# Patient Record
Sex: Female | Born: 1935 | Race: White | Hispanic: No | Smoking: Never smoker
Health system: Southern US, Community
[De-identification: ages and names within clinical notes are randomized; demographics above are authoritative.]

## PROBLEM LIST (undated history)

## (undated) DIAGNOSIS — B029 Zoster without complications: Secondary | ICD-10-CM

## (undated) DIAGNOSIS — F419 Anxiety disorder, unspecified: Secondary | ICD-10-CM

## (undated) DIAGNOSIS — I4891 Unspecified atrial fibrillation: Secondary | ICD-10-CM

## (undated) DIAGNOSIS — E7800 Pure hypercholesterolemia, unspecified: Secondary | ICD-10-CM

## (undated) DIAGNOSIS — E559 Vitamin D deficiency, unspecified: Secondary | ICD-10-CM

## (undated) DIAGNOSIS — B0222 Postherpetic trigeminal neuralgia: Secondary | ICD-10-CM

## (undated) DIAGNOSIS — M810 Age-related osteoporosis without current pathological fracture: Secondary | ICD-10-CM

## (undated) DIAGNOSIS — I639 Cerebral infarction, unspecified: Secondary | ICD-10-CM

## (undated) DIAGNOSIS — I2699 Other pulmonary embolism without acute cor pulmonale: Secondary | ICD-10-CM

## (undated) DIAGNOSIS — N189 Chronic kidney disease, unspecified: Secondary | ICD-10-CM

## (undated) DIAGNOSIS — I6350 Cerebral infarction due to unspecified occlusion or stenosis of unspecified cerebral artery: Secondary | ICD-10-CM

## (undated) DIAGNOSIS — E162 Hypoglycemia, unspecified: Secondary | ICD-10-CM

## (undated) DIAGNOSIS — K219 Gastro-esophageal reflux disease without esophagitis: Secondary | ICD-10-CM

## (undated) DIAGNOSIS — R413 Other amnesia: Secondary | ICD-10-CM

## (undated) DIAGNOSIS — E039 Hypothyroidism, unspecified: Secondary | ICD-10-CM

## (undated) DIAGNOSIS — F410 Panic disorder [episodic paroxysmal anxiety] without agoraphobia: Secondary | ICD-10-CM

## (undated) DIAGNOSIS — H409 Unspecified glaucoma: Secondary | ICD-10-CM

## (undated) DIAGNOSIS — M1990 Unspecified osteoarthritis, unspecified site: Secondary | ICD-10-CM

## (undated) DIAGNOSIS — G500 Trigeminal neuralgia: Secondary | ICD-10-CM

## (undated) DIAGNOSIS — I82409 Acute embolism and thrombosis of unspecified deep veins of unspecified lower extremity: Secondary | ICD-10-CM

## (undated) DIAGNOSIS — G43909 Migraine, unspecified, not intractable, without status migrainosus: Secondary | ICD-10-CM

## (undated) DIAGNOSIS — F0390 Unspecified dementia without behavioral disturbance: Secondary | ICD-10-CM

## (undated) DIAGNOSIS — Z7901 Long term (current) use of anticoagulants: Secondary | ICD-10-CM

## (undated) DIAGNOSIS — R001 Bradycardia, unspecified: Secondary | ICD-10-CM

## (undated) DIAGNOSIS — S8990XA Unspecified injury of unspecified lower leg, initial encounter: Secondary | ICD-10-CM

## (undated) DIAGNOSIS — I10 Essential (primary) hypertension: Secondary | ICD-10-CM

## (undated) DIAGNOSIS — K5790 Diverticulosis of intestine, part unspecified, without perforation or abscess without bleeding: Secondary | ICD-10-CM

## (undated) DIAGNOSIS — I809 Phlebitis and thrombophlebitis of unspecified site: Secondary | ICD-10-CM

## (undated) DIAGNOSIS — K589 Irritable bowel syndrome without diarrhea: Secondary | ICD-10-CM

## (undated) HISTORY — DX: Unspecified osteoarthritis, unspecified site: M19.90

## (undated) HISTORY — DX: Pure hypercholesterolemia, unspecified: E78.00

## (undated) HISTORY — DX: Age-related osteoporosis without current pathological fracture: M81.0

## (undated) HISTORY — DX: Zoster without complications: B02.9

## (undated) HISTORY — DX: Panic disorder (episodic paroxysmal anxiety): F41.0

## (undated) HISTORY — DX: Unspecified atrial fibrillation: I48.91

## (undated) HISTORY — DX: Hypothyroidism, unspecified: E03.9

## (undated) HISTORY — DX: Vitamin D deficiency, unspecified: E55.9

## (undated) HISTORY — DX: Other amnesia: R41.3

## (undated) HISTORY — DX: Acute embolism and thrombosis of unspecified deep veins of unspecified lower extremity: I82.409

## (undated) HISTORY — PX: LEG / ANKLE SOFT TISSUE BIOPSY: SUR148

## (undated) HISTORY — DX: Unspecified glaucoma: H40.9

## (undated) HISTORY — DX: Hypoglycemia, unspecified: E16.2

## (undated) HISTORY — DX: Cerebral infarction, unspecified: I63.9

## (undated) HISTORY — DX: Other pulmonary embolism without acute cor pulmonale: I26.99

## (undated) HISTORY — DX: Gastro-esophageal reflux disease without esophagitis: K21.9

## (undated) HISTORY — DX: Diverticulosis of intestine, part unspecified, without perforation or abscess without bleeding: K57.90

## (undated) HISTORY — DX: Postherpetic trigeminal neuralgia: B02.22

## (undated) HISTORY — DX: Trigeminal neuralgia: G50.0

## (undated) HISTORY — DX: Chronic kidney disease, unspecified: N18.9

## (undated) HISTORY — DX: Unspecified dementia, unspecified severity, without behavioral disturbance, psychotic disturbance, mood disturbance, and anxiety: F03.90

## (undated) HISTORY — DX: Anxiety disorder, unspecified: F41.9

## (undated) HISTORY — PX: TONSILLECTOMY: SUR1361

## (undated) HISTORY — DX: Essential (primary) hypertension: I10

## (undated) HISTORY — DX: Cerebral infarction due to unspecified occlusion or stenosis of unspecified cerebral artery: I63.50

## (undated) HISTORY — PX: DILATION AND CURETTAGE, DIAGNOSTIC / THERAPEUTIC: SUR384

## (undated) HISTORY — DX: Migraine, unspecified, not intractable, without status migrainosus: G43.909

## (undated) HISTORY — DX: Irritable bowel syndrome, unspecified: K58.9

## (undated) HISTORY — DX: Phlebitis and thrombophlebitis of unspecified site: I80.9

---
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
## 1962-03-25 DIAGNOSIS — I639 Cerebral infarction, unspecified: Secondary | ICD-10-CM

## 1962-03-25 HISTORY — DX: Cerebral infarction, unspecified: I63.9

## 1963-07-24 HISTORY — PX: OTHER SURGICAL HISTORY: SHX169

## 1964-03-25 HISTORY — PX: HEMORRHOID SURGERY: SHX153

## 1969-03-25 HISTORY — PX: OTHER SURGICAL HISTORY: SHX169

## 1976-03-25 HISTORY — PX: FOOT SURGERY: SHX648

## 1978-03-25 HISTORY — PX: GALLBLADDER SURGERY: SHX652

## 1983-01-24 HISTORY — PX: CHOLECYSTECTOMY: SHX55

## 2001-08-23 HISTORY — PX: CARDIAC CATHETERIZATION: SHX172

## 2001-08-29 ENCOUNTER — Encounter: Payer: Self-pay | Admitting: *Deleted

## 2001-08-29 ENCOUNTER — Encounter (INDEPENDENT_AMBULATORY_CARE_PROVIDER_SITE_OTHER): Payer: Self-pay | Admitting: Cardiology

## 2001-08-29 ENCOUNTER — Inpatient Hospital Stay (HOSPITAL_COMMUNITY): Admission: EM | Admit: 2001-08-29 | Discharge: 2001-09-05 | Payer: Self-pay | Admitting: *Deleted

## 2001-08-30 ENCOUNTER — Encounter: Payer: Self-pay | Admitting: Cardiology

## 2001-09-03 ENCOUNTER — Encounter: Payer: Self-pay | Admitting: Cardiology

## 2004-04-19 ENCOUNTER — Ambulatory Visit (HOSPITAL_COMMUNITY): Admission: RE | Admit: 2004-04-19 | Discharge: 2004-04-19 | Payer: Self-pay | Admitting: Family Medicine

## 2005-03-25 DIAGNOSIS — I82409 Acute embolism and thrombosis of unspecified deep veins of unspecified lower extremity: Secondary | ICD-10-CM

## 2005-03-25 HISTORY — DX: Acute embolism and thrombosis of unspecified deep veins of unspecified lower extremity: I82.409

## 2005-03-25 HISTORY — PX: OTHER SURGICAL HISTORY: SHX169

## 2005-03-25 HISTORY — PX: PULMONARY EMBOLISM SURGERY: SHX752

## 2005-04-18 ENCOUNTER — Ambulatory Visit (HOSPITAL_COMMUNITY): Admission: RE | Admit: 2005-04-18 | Discharge: 2005-04-18 | Payer: Self-pay | Admitting: Emergency Medicine

## 2005-04-19 ENCOUNTER — Inpatient Hospital Stay (HOSPITAL_COMMUNITY): Admission: EM | Admit: 2005-04-19 | Discharge: 2005-04-24 | Payer: Self-pay | Admitting: Emergency Medicine

## 2005-04-30 ENCOUNTER — Ambulatory Visit: Payer: Self-pay | Admitting: Internal Medicine

## 2005-05-02 ENCOUNTER — Inpatient Hospital Stay (HOSPITAL_COMMUNITY): Admission: EM | Admit: 2005-05-02 | Discharge: 2005-05-04 | Payer: Self-pay | Admitting: Emergency Medicine

## 2005-05-17 ENCOUNTER — Encounter: Admission: RE | Admit: 2005-05-17 | Discharge: 2005-05-17 | Payer: Self-pay | Admitting: Endocrinology

## 2005-06-11 ENCOUNTER — Encounter: Admission: RE | Admit: 2005-06-11 | Discharge: 2005-06-11 | Payer: Self-pay | Admitting: Endocrinology

## 2005-06-11 ENCOUNTER — Encounter (INDEPENDENT_AMBULATORY_CARE_PROVIDER_SITE_OTHER): Payer: Self-pay | Admitting: Specialist

## 2005-06-11 ENCOUNTER — Other Ambulatory Visit: Admission: RE | Admit: 2005-06-11 | Discharge: 2005-06-11 | Payer: Self-pay | Admitting: Interventional Radiology

## 2005-06-25 ENCOUNTER — Ambulatory Visit (HOSPITAL_COMMUNITY): Admission: RE | Admit: 2005-06-25 | Discharge: 2005-06-25 | Payer: Self-pay | Admitting: Endocrinology

## 2005-06-28 ENCOUNTER — Ambulatory Visit: Payer: Self-pay | Admitting: Internal Medicine

## 2005-07-01 LAB — CBC WITH DIFFERENTIAL/PLATELET
BASO%: 0.7 % (ref 0.0–2.0)
Basophils Absolute: 0 10*3/uL (ref 0.0–0.1)
EOS%: 4.7 % (ref 0.0–7.0)
HCT: 33.5 % — ABNORMAL LOW (ref 34.8–46.6)
HGB: 11.2 g/dL — ABNORMAL LOW (ref 11.6–15.9)
LYMPH%: 19 % (ref 14.0–48.0)
MCH: 28.7 pg (ref 26.0–34.0)
MCHC: 33.4 g/dL (ref 32.0–36.0)
MCV: 85.9 fL (ref 81.0–101.0)
MONO%: 12.7 % (ref 0.0–13.0)
NEUT%: 62.9 % (ref 39.6–76.8)
lymph#: 1 10*3/uL (ref 0.9–3.3)

## 2005-07-01 LAB — COMPREHENSIVE METABOLIC PANEL
ALT: 9 U/L (ref 0–40)
AST: 15 U/L (ref 0–37)
Albumin: 4 g/dL (ref 3.5–5.2)
CO2: 22 mEq/L (ref 19–32)
Calcium: 8.9 mg/dL (ref 8.4–10.5)

## 2005-09-13 ENCOUNTER — Other Ambulatory Visit: Admission: RE | Admit: 2005-09-13 | Discharge: 2005-09-13 | Payer: Self-pay | Admitting: Family Medicine

## 2005-12-23 HISTORY — PX: OTHER SURGICAL HISTORY: SHX169

## 2006-03-06 ENCOUNTER — Ambulatory Visit: Admission: RE | Admit: 2006-03-06 | Discharge: 2006-03-06 | Payer: Self-pay | Admitting: Family Medicine

## 2006-03-06 ENCOUNTER — Encounter: Payer: Self-pay | Admitting: Vascular Surgery

## 2006-06-24 HISTORY — PX: OTHER SURGICAL HISTORY: SHX169

## 2006-07-07 ENCOUNTER — Ambulatory Visit (HOSPITAL_COMMUNITY): Admission: RE | Admit: 2006-07-07 | Discharge: 2006-07-07 | Payer: Self-pay | Admitting: Family Medicine

## 2006-07-10 ENCOUNTER — Inpatient Hospital Stay (HOSPITAL_COMMUNITY): Admission: EM | Admit: 2006-07-10 | Discharge: 2006-07-15 | Payer: Self-pay | Admitting: Emergency Medicine

## 2006-07-11 ENCOUNTER — Encounter (INDEPENDENT_AMBULATORY_CARE_PROVIDER_SITE_OTHER): Payer: Self-pay | Admitting: Cardiology

## 2006-07-11 ENCOUNTER — Ambulatory Visit: Payer: Self-pay | Admitting: Oncology

## 2006-07-28 ENCOUNTER — Ambulatory Visit: Payer: Self-pay | Admitting: Internal Medicine

## 2007-01-26 ENCOUNTER — Ambulatory Visit: Payer: Self-pay | Admitting: Internal Medicine

## 2007-01-28 LAB — APTT: aPTT: 35 seconds (ref 24–37)

## 2007-01-28 LAB — CBC WITH DIFFERENTIAL/PLATELET
Eosinophils Absolute: 0.3 10*3/uL (ref 0.0–0.5)
LYMPH%: 25 % (ref 14.0–48.0)
MCHC: 34.9 g/dL (ref 32.0–36.0)
MCV: 85.3 fL (ref 81.0–101.0)
MONO%: 16.3 % — ABNORMAL HIGH (ref 0.0–13.0)
NEUT%: 51.7 % (ref 39.6–76.8)
Platelets: 163 10*3/uL (ref 145–400)
RBC: 3.89 10*6/uL (ref 3.70–5.32)

## 2008-05-11 ENCOUNTER — Ambulatory Visit (HOSPITAL_COMMUNITY): Admission: RE | Admit: 2008-05-11 | Discharge: 2008-05-11 | Payer: Self-pay | Admitting: Family Medicine

## 2008-09-21 ENCOUNTER — Emergency Department (HOSPITAL_COMMUNITY): Admission: EM | Admit: 2008-09-21 | Discharge: 2008-09-21 | Payer: Self-pay | Admitting: Emergency Medicine

## 2008-09-23 ENCOUNTER — Encounter: Admission: RE | Admit: 2008-09-23 | Discharge: 2008-09-23 | Payer: Self-pay | Admitting: Neurology

## 2008-10-27 ENCOUNTER — Emergency Department (HOSPITAL_COMMUNITY): Admission: EM | Admit: 2008-10-27 | Discharge: 2008-10-27 | Payer: Self-pay | Admitting: Emergency Medicine

## 2008-11-07 ENCOUNTER — Inpatient Hospital Stay (HOSPITAL_COMMUNITY): Admission: AD | Admit: 2008-11-07 | Discharge: 2008-11-08 | Payer: Self-pay | Admitting: Internal Medicine

## 2008-11-11 ENCOUNTER — Inpatient Hospital Stay (HOSPITAL_COMMUNITY): Admission: AD | Admit: 2008-11-11 | Discharge: 2008-11-19 | Payer: Self-pay | Admitting: Internal Medicine

## 2008-11-12 ENCOUNTER — Encounter (INDEPENDENT_AMBULATORY_CARE_PROVIDER_SITE_OTHER): Payer: Self-pay | Admitting: Internal Medicine

## 2008-11-15 ENCOUNTER — Encounter (INDEPENDENT_AMBULATORY_CARE_PROVIDER_SITE_OTHER): Payer: Self-pay | Admitting: Internal Medicine

## 2008-11-15 ENCOUNTER — Ambulatory Visit: Payer: Self-pay | Admitting: Vascular Surgery

## 2008-11-22 ENCOUNTER — Emergency Department (HOSPITAL_COMMUNITY): Admission: EM | Admit: 2008-11-22 | Discharge: 2008-11-22 | Payer: Self-pay | Admitting: Emergency Medicine

## 2009-01-09 ENCOUNTER — Encounter: Admission: RE | Admit: 2009-01-09 | Discharge: 2009-02-10 | Payer: Self-pay | Admitting: Neurology

## 2009-02-06 ENCOUNTER — Encounter: Admission: RE | Admit: 2009-02-06 | Discharge: 2009-02-06 | Payer: Self-pay | Admitting: Family Medicine

## 2009-05-25 ENCOUNTER — Observation Stay (HOSPITAL_COMMUNITY): Admission: EM | Admit: 2009-05-25 | Discharge: 2009-05-29 | Payer: Self-pay | Admitting: Emergency Medicine

## 2009-05-25 ENCOUNTER — Ambulatory Visit: Payer: Self-pay | Admitting: Vascular Surgery

## 2009-05-25 ENCOUNTER — Encounter (INDEPENDENT_AMBULATORY_CARE_PROVIDER_SITE_OTHER): Payer: Self-pay | Admitting: Emergency Medicine

## 2010-01-03 ENCOUNTER — Encounter: Admission: RE | Admit: 2010-01-03 | Discharge: 2010-01-03 | Payer: Self-pay | Admitting: Gastroenterology

## 2010-03-14 ENCOUNTER — Encounter
Admission: RE | Admit: 2010-03-14 | Discharge: 2010-03-14 | Payer: Self-pay | Source: Home / Self Care | Attending: Neurology | Admitting: Neurology

## 2010-04-15 ENCOUNTER — Encounter: Payer: Self-pay | Admitting: Family Medicine

## 2010-04-15 ENCOUNTER — Encounter: Payer: Self-pay | Admitting: Endocrinology

## 2010-06-17 LAB — BASIC METABOLIC PANEL
BUN: 19 mg/dL (ref 6–23)
CO2: 24 mEq/L (ref 19–32)
Calcium: 8.7 mg/dL (ref 8.4–10.5)
Creatinine, Ser: 1.22 mg/dL — ABNORMAL HIGH (ref 0.4–1.2)
Glucose, Bld: 108 mg/dL — ABNORMAL HIGH (ref 70–99)

## 2010-06-17 LAB — CBC
HCT: 29.3 % — ABNORMAL LOW (ref 36.0–46.0)
Platelets: 128 10*3/uL — ABNORMAL LOW (ref 150–400)
RDW: 15.6 % — ABNORMAL HIGH (ref 11.5–15.5)

## 2010-06-17 LAB — URINE CULTURE
Colony Count: 8000
Special Requests: NEGATIVE

## 2010-06-17 LAB — COMPREHENSIVE METABOLIC PANEL
Albumin: 3.3 g/dL — ABNORMAL LOW (ref 3.5–5.2)
Alkaline Phosphatase: 91 U/L (ref 39–117)
BUN: 17 mg/dL (ref 6–23)
Creatinine, Ser: 1.25 mg/dL — ABNORMAL HIGH (ref 0.4–1.2)
Potassium: 4.3 mEq/L (ref 3.5–5.1)
Total Protein: 6 g/dL (ref 6.0–8.3)

## 2010-06-17 LAB — PROTIME-INR: INR: 1.22 (ref 0.00–1.49)

## 2010-06-17 LAB — APTT: aPTT: 40 seconds — ABNORMAL HIGH (ref 24–37)

## 2010-06-30 LAB — COMPREHENSIVE METABOLIC PANEL
ALT: 10 U/L (ref 0–35)
ALT: <8 U/L (ref 0–35)
AST: 15 U/L (ref 0–37)
AST: 18 U/L (ref 0–37)
Albumin: 3.6 g/dL (ref 3.5–5.2)
Albumin: 3.7 g/dL (ref 3.5–5.2)
Alkaline Phosphatase: 57 U/L (ref 39–117)
Alkaline Phosphatase: 62 U/L (ref 39–117)
BUN: 12 mg/dL (ref 6–23)
CO2: 23 mEq/L (ref 19–32)
CO2: 25 mEq/L (ref 19–32)
Calcium: 9.4 mg/dL (ref 8.4–10.5)
Chloride: 93 mEq/L — ABNORMAL LOW (ref 96–112)
Chloride: 93 mEq/L — ABNORMAL LOW (ref 96–112)
Creatinine, Ser: 1.15 mg/dL (ref 0.4–1.2)
GFR calc Af Amer: 55 mL/min — ABNORMAL LOW (ref >60)
GFR calc Af Amer: >60 mL/min (ref >60)
GFR calc non Af Amer: 46 mL/min — ABNORMAL LOW (ref >60)
GFR calc non Af Amer: 51 mL/min — ABNORMAL LOW (ref >60)
Glucose, Bld: 97 mg/dL (ref 70–99)
Potassium: 4.2 mEq/L (ref 3.5–5.1)
Potassium: 4.4 mEq/L (ref 3.5–5.1)
Sodium: 124 mEq/L — ABNORMAL LOW (ref 135–145)
Sodium: 126 mEq/L — ABNORMAL LOW (ref 135–145)
Total Bilirubin: 0.6 mg/dL (ref 0.3–1.2)
Total Bilirubin: 0.8 mg/dL (ref 0.3–1.2)
Total Protein: 6.9 g/dL (ref 6.0–8.3)

## 2010-06-30 LAB — URINALYSIS, ROUTINE W REFLEX MICROSCOPIC
Glucose, UA: NEGATIVE mg/dL
Glucose, UA: NEGATIVE mg/dL
Hgb urine dipstick: NEGATIVE
Specific Gravity, Urine: 1.005 (ref 1.005–1.030)
pH: 5.5 (ref 5.0–8.0)
pH: 5.5 (ref 5.0–8.0)

## 2010-06-30 LAB — BASIC METABOLIC PANEL
BUN: 16 mg/dL (ref 6–23)
BUN: 24 mg/dL — ABNORMAL HIGH (ref 6–23)
BUN: 30 mg/dL — ABNORMAL HIGH (ref 6–23)
BUN: 16 mg/dL (ref 6–23)
CO2: 23 mEq/L (ref 19–32)
CO2: 24 mEq/L (ref 19–32)
CO2: 25 mEq/L (ref 19–32)
CO2: 25 mEq/L (ref 19–32)
CO2: 25 mEq/L (ref 19–32)
Calcium: 8.9 mg/dL (ref 8.4–10.5)
Calcium: 8.9 mg/dL (ref 8.4–10.5)
Calcium: 9.5 mg/dL (ref 8.4–10.5)
Calcium: 9.5 mg/dL (ref 8.4–10.5)
Chloride: 87 mEq/L — ABNORMAL LOW (ref 96–112)
Chloride: 87 mEq/L — ABNORMAL LOW (ref 96–112)
Chloride: 88 mEq/L — ABNORMAL LOW (ref 96–112)
Chloride: 88 mEq/L — ABNORMAL LOW (ref 96–112)
Chloride: 92 mEq/L — ABNORMAL LOW (ref 96–112)
Chloride: 93 mEq/L — ABNORMAL LOW (ref 96–112)
Chloride: 99 mEq/L (ref 96–112)
Creatinine, Ser: 1.3 mg/dL — ABNORMAL HIGH (ref 0.4–1.2)
Creatinine, Ser: 1.31 mg/dL — ABNORMAL HIGH (ref 0.4–1.2)
Creatinine, Ser: 1.33 mg/dL — ABNORMAL HIGH (ref 0.4–1.2)
Creatinine, Ser: 1.68 mg/dL — ABNORMAL HIGH (ref 0.4–1.2)
GFR calc Af Amer: 31 mL/min — ABNORMAL LOW (ref >60)
GFR calc Af Amer: 36 mL/min — ABNORMAL LOW (ref >60)
GFR calc Af Amer: 47 mL/min — ABNORMAL LOW (ref >60)
GFR calc Af Amer: 48 mL/min — ABNORMAL LOW (ref >60)
GFR calc Af Amer: 48 mL/min — ABNORMAL LOW (ref >60)
GFR calc Af Amer: 52 mL/min — ABNORMAL LOW (ref >60)
GFR calc non Af Amer: 39 mL/min — ABNORMAL LOW (ref >60)
GFR calc non Af Amer: 40 mL/min — ABNORMAL LOW (ref >60)
GFR calc non Af Amer: 42 mL/min — ABNORMAL LOW (ref >60)
GFR calc non Af Amer: 43 mL/min — ABNORMAL LOW (ref >60)
Glucose, Bld: 130 mg/dL — ABNORMAL HIGH (ref 70–99)
Glucose, Bld: 87 mg/dL (ref 70–99)
Glucose, Bld: 90 mg/dL (ref 70–99)
Glucose, Bld: 90 mg/dL (ref 70–99)
Glucose, Bld: 97 mg/dL (ref 70–99)
Potassium: 4.2 mEq/L (ref 3.5–5.1)
Potassium: 4.2 mEq/L (ref 3.5–5.1)
Potassium: 4.2 mEq/L (ref 3.5–5.1)
Potassium: 4.3 mEq/L (ref 3.5–5.1)
Potassium: 4.6 mEq/L (ref 3.5–5.1)
Sodium: 120 mEq/L — ABNORMAL LOW (ref 135–145)
Sodium: 121 mEq/L — ABNORMAL LOW (ref 135–145)
Sodium: 124 mEq/L — ABNORMAL LOW (ref 135–145)
Sodium: 125 mEq/L — ABNORMAL LOW (ref 135–145)
Sodium: 135 mEq/L (ref 135–145)
Sodium: 131 mEq/L — ABNORMAL LOW (ref 135–145)

## 2010-06-30 LAB — DIFFERENTIAL
Basophils Absolute: 0 10*3/uL (ref 0.0–0.1)
Basophils Absolute: 0 10*3/uL (ref 0.0–0.1)
Basophils Relative: 0 % (ref 0–1)
Basophils Relative: 1 % (ref 0–1)
Eosinophils Absolute: 0.2 10*3/uL (ref 0.0–0.7)
Eosinophils Absolute: 0.2 10*3/uL (ref 0.0–0.7)
Eosinophils Absolute: 0.1 10*3/uL (ref 0.0–0.7)
Eosinophils Relative: 2 % (ref 0–5)
Eosinophils Relative: 4 % (ref 0–5)
Eosinophils Relative: 4 % (ref 0–5)
Eosinophils Relative: 2 % (ref 0–5)
Lymphocytes Relative: 17 % (ref 12–46)
Lymphocytes Relative: 31 % (ref 12–46)
Lymphs Abs: 1.2 10*3/uL (ref 0.7–4.0)
Lymphs Abs: 1.2 10*3/uL (ref 0.7–4.0)
Monocytes Absolute: 1 10*3/uL (ref 0.1–1.0)
Monocytes Absolute: 1.1 10*3/uL — ABNORMAL HIGH (ref 0.1–1.0)
Monocytes Absolute: 0.8 10*3/uL (ref 0.1–1.0)
Monocytes Relative: 19 % — ABNORMAL HIGH (ref 3–12)
Monocytes Relative: 13 % — ABNORMAL HIGH (ref 3–12)
Neutro Abs: 3 10*3/uL (ref 1.7–7.7)
Neutrophils Relative %: 48 % (ref 43–77)
Neutrophils Relative %: 60 % (ref 43–77)

## 2010-06-30 LAB — CBC
HCT: 29.9 % — ABNORMAL LOW (ref 36.0–46.0)
HCT: 30.5 % — ABNORMAL LOW (ref 36.0–46.0)
HCT: 30.6 % — ABNORMAL LOW (ref 36.0–46.0)
HCT: 30 % — ABNORMAL LOW (ref 36.0–46.0)
Hemoglobin: 10.2 g/dL — ABNORMAL LOW (ref 12.0–15.0)
Hemoglobin: 10.4 g/dL — ABNORMAL LOW (ref 12.0–15.0)
Hemoglobin: 10.5 g/dL — ABNORMAL LOW (ref 12.0–15.0)
Hemoglobin: 10.5 g/dL — ABNORMAL LOW (ref 12.0–15.0)
Hemoglobin: 10.8 g/dL — ABNORMAL LOW (ref 12.0–15.0)
Hemoglobin: 10.9 g/dL — ABNORMAL LOW (ref 12.0–15.0)
Hemoglobin: 10.2 g/dL — ABNORMAL LOW (ref 12.0–15.0)
MCHC: 33.3 g/dL (ref 30.0–36.0)
MCHC: 33.6 g/dL (ref 30.0–36.0)
MCHC: 34.5 g/dL (ref 30.0–36.0)
MCHC: 34.8 g/dL (ref 30.0–36.0)
MCHC: 34.9 g/dL (ref 30.0–36.0)
MCV: 87 fL (ref 78.0–100.0)
MCV: 87.9 fL (ref 78.0–100.0)
MCV: 88.1 fL (ref 78.0–100.0)
MCV: 88.1 fL (ref 78.0–100.0)
Platelets: 125 10*3/uL — ABNORMAL LOW (ref 150–400)
Platelets: 130 10*3/uL — ABNORMAL LOW (ref 150–400)
Platelets: 161 10*3/uL (ref 150–400)
Platelets: 143 10*3/uL — ABNORMAL LOW (ref 150–400)
RBC: 3.32 MIL/uL — ABNORMAL LOW (ref 3.87–5.11)
RBC: 3.44 MIL/uL — ABNORMAL LOW (ref 3.87–5.11)
RBC: 3.52 MIL/uL — ABNORMAL LOW (ref 3.87–5.11)
RBC: 3.57 MIL/uL — ABNORMAL LOW (ref 3.87–5.11)
RBC: 3.69 MIL/uL — ABNORMAL LOW (ref 3.87–5.11)
RBC: 3.4 MIL/uL — ABNORMAL LOW (ref 3.87–5.11)
RDW: 15.6 % — ABNORMAL HIGH (ref 11.5–15.5)
RDW: 16.2 % — ABNORMAL HIGH (ref 11.5–15.5)
RDW: 16.2 % — ABNORMAL HIGH (ref 11.5–15.5)
RDW: 16.5 % — ABNORMAL HIGH (ref 11.5–15.5)
WBC: 4.6 10*3/uL (ref 4.0–10.5)
WBC: 5.1 10*3/uL (ref 4.0–10.5)
WBC: 5.3 10*3/uL (ref 4.0–10.5)
WBC: 5.8 10*3/uL (ref 4.0–10.5)
WBC: 5.9 10*3/uL (ref 4.0–10.5)
WBC: 6.1 10*3/uL (ref 4.0–10.5)

## 2010-06-30 LAB — POCT CARDIAC MARKERS
CKMB, poc: <1 ng/mL — ABNORMAL LOW (ref 1.0–8.0)
Myoglobin, poc: 133 ng/mL (ref 12–200)
Myoglobin, poc: 94 ng/mL (ref 12–200)

## 2010-06-30 LAB — ACTH STIMULATION, 3 TIME POINTS
Cortisol, 30 Min: 18.5 ug/dL (ref >20)
Cortisol, 60 Min: 22.6 ug/dL (ref >20)

## 2010-06-30 LAB — URINE MICROSCOPIC-ADD ON

## 2010-06-30 LAB — URINE CULTURE: Colony Count: 15000

## 2010-06-30 LAB — AMYLASE: Amylase: 96 U/L (ref 27–131)

## 2010-06-30 LAB — CULTURE, BLOOD (ROUTINE X 2): Culture: NO GROWTH

## 2010-06-30 LAB — LIPASE, BLOOD: Lipase: 33 U/L (ref 11–59)

## 2010-06-30 LAB — TSH: TSH: 1.328 u[IU]/mL (ref 0.350–4.500)

## 2010-06-30 LAB — CLOSTRIDIUM DIFFICILE EIA

## 2010-07-02 LAB — DIFFERENTIAL
Basophils Absolute: 0 10*3/uL (ref 0.0–0.1)
Basophils Relative: 0 % (ref 0–1)
Eosinophils Absolute: 0 10*3/uL (ref 0.0–0.7)
Monocytes Absolute: 1.4 10*3/uL — ABNORMAL HIGH (ref 0.1–1.0)
Monocytes Relative: 12 % (ref 3–12)
Neutro Abs: 9.3 10*3/uL — ABNORMAL HIGH (ref 1.7–7.7)
Neutrophils Relative %: 79 % — ABNORMAL HIGH (ref 43–77)

## 2010-07-02 LAB — BASIC METABOLIC PANEL
CO2: 25 mEq/L (ref 19–32)
Calcium: 9.3 mg/dL (ref 8.4–10.5)
Chloride: 100 mEq/L (ref 96–112)
Creatinine, Ser: 1.39 mg/dL — ABNORMAL HIGH (ref 0.4–1.2)
Glucose, Bld: 98 mg/dL (ref 70–99)

## 2010-07-02 LAB — CBC
Hemoglobin: 11.2 g/dL — ABNORMAL LOW (ref 12.0–15.0)
MCHC: 33.8 g/dL (ref 30.0–36.0)
MCV: 87.4 fL (ref 78.0–100.0)
RDW: 15.9 % — ABNORMAL HIGH (ref 11.5–15.5)

## 2010-07-02 LAB — APTT: aPTT: 31 seconds (ref 24–37)

## 2010-08-07 NOTE — H&P (Signed)
[name], [name]               ACCOUNT NO.:  [id number]   MEDICAL RECORD NO.:  [id number]          PATIENT TYPE:  INP   LOCATION:  5149                         FACILITY:  [facility]   PHYSICIAN:  Donalynn Furlong, MD      DATE OF BIRTH:  16-Apr-1929   DATE OF ADMISSION:  11/11/2008  DATE OF DISCHARGE:                              HISTORY & PHYSICAL   PRIMARY CARE Samadhi Mahurin:  Stacie Acres. White, M.D.   CHIEF COMPLAINT:  Worsening nausea, vomiting, diarrhea.   HISTORY OF PRESENT ILLNESS:  [name] is a 77 year old Caucasian  female.  She lives in [place] by herself.  She presented to [facility] today.  She was recently released from hospital on August  17 after getting treatment for UTI with Rocephin and discharged home on  Ceftin to finish the 5-day course.  After she was discharged, she had a  good day on Wednesday.  On Thursday evening, she started having nausea  and vomiting with a dose of her medication and some diarrhea.  Her  symptoms progressed to the level that she could not keep her food down  without vomiting, which disturbed her lot, and she decided to see her  primary care Haylee Mcanany's office.  Today morning, she went to see her  primary care today, and it was decided that she needs to be admitted in  the hospital.  The patient has some chronic cough and shortness of  breath associated with the nausea and vomiting.  The patient denies any  chest pain, any sputum production.  Patient has no severe abdominal pain  either.  The patient has a decreased urinary output, as mentioned by the  patient.  The patient mentioned she passes urine very little, otherwise  she is doing well.  The patient has been feeling dry in her mouth also.  The patient has mild swelling at her ankle too.  The patient has  documented multiple allergies, but she has tolerated ceftriaxone in the  last admission without any trouble.  Most of the allergies are in terms  of nausea, vomiting for  PENICILLIN.   PAST MEDICAL HISTORY:  Recurrent DVTs with PE, status post Greenfield  filter, glaucoma, migraines hypoglycemia, gastroesophageal reflux  disease with hiatal hernia, diverticulosis, palpitations, atrial  fibrillation, phlebitis, osteoarthritis in bilateral knees and back,  panic attacks, irritable bowel syndrome, hypertension, post-herpetic  neuralgia of the right face, goiter, hypothyroidism, sleep apnea on  CPAP.  The patient has not been using CPAP machine.  Vitamin D  deficiency.  CVA in the past.  Breast biopsies x3, cholecystectomy,  hemorrhoidectomy, tonsillectomy, appendectomy, cyst removed from right  foot as well.   PAST MEDICAL HISTORY:  As per past medical history.   FAMILY HISTORY:  Father with hypertension.  Mother with diabetes,  glaucoma, CHF, hypertension, coronary artery disease, CVA.  Maternal  grandfather with stomach cancer.  One brother with hypertension and CVA.  Maternal uncle with rectal cancer and colon cancer.  Maternal aunt with  breast cancer.   SOCIAL HISTORY:  No smoking or alcohol use in the past.  No illicit drug  use.  She is a retired Oceanographer.  She is divorced.  She has 3  children.   Allergy list includes PRESERVATIVES IN EYEDROPS, SEPTRA, PLAVIX,  PENICILLIN, PLAVIX, PENICILLIN, PATANOL, NAPROSYN, LEVAQUIN, COUMADIN,  COMBINVENT, CIPRO, CELEBREX, BIAXIN, BETADINE, BENADRYL, AMBIEN,  SHELLFISH, IV DYE, SHRIMP, and VOLATREN.   Home medication list includes sotalol, Lumigan, Citracal, vitamin D,  Claritin, potassium chloride, dorzolamide/timolol eye drops, Ativan,  meclizine, Imodium, EpiPen, vitamin D, Diovan, Lovenox, levothyroxine,  sodium, Pravachol, Neurontin, amitriptyline, Aciphex, Lasix, Darvocet-N  100, folic acid, hydrocodone/acetaminophen tablets, cephalexin,  Cymbalta, Carbatrol, and promethazine.   PHYSICAL EXAMINATION:  VITAL SIGNS:  Blood pressure 122/78, temperature  98.0, respiration 18 per minute.   Heart rate 92 per minute.  GENERAL:  Alert, oriented, laying in bed without any acute distress.  CARDIOVASCULAR:  S1 and S2 regular.  No murmur or gallop.  LUNGS:  Clear to auscultation bilaterally.  ABDOMEN:  Obese.  Active bowel sounds.  Soft and tender to palpation  along the suprapubic region.  No rebound or guarding.  EXTREMITIES:  No clubbing, cyanosis or edema.  Moving all four  extremities.  BACK: No CVA tenderness.  SKIN:  No rash or bruits.  NEUROLOGIC:  Intact cranial nerves, muscular strength, sensation and  reflexes.  HEAD:  Normocephalic nontraumatic.  EYES:  Pupils are equal, round and reactive to light and accommodation.  Extraocular muscles intact.  NECK:  No thyromegaly or JVD.   LAB WORK:  The patient did not have any lab work done today.  Her  previous lab work shows on August 17 with a creatinine of 1.3 and GFR of  40.  CBC with differential on August 17 showed WBC 6.2, hemoglobin 10.5,  platelets 129.  The patient does not have any other workup done from  labs today.   Her abdominal x-ray, two-view, shows no acute findings.   Chest X-Ray: Two-view shows cardiomegaly and vascular congestion.   ASSESSMENT/PLAN:  1. Urinary tract infection with Pseudomonas bacteria which is pan      sensitive.  The patient has received partial treatment for      Pseudomonas urinary tract infection, and she is improving now.  2. Nausea, vomiting and diarrhea secondary to polypharmacy.  3. History of recurrent deep venous thrombosis, status post pulmonary      embolus with Greenfield filter in the past.  4. Glaucoma.  5. Gastroesophageal reflux disease.  6. Atrial fibrillation.  7. Osteoarthritis.  8. Panic attacks.  9. Irritable bowel syndrome.  10.Hypertension.  11.Goiter/hypothyroidism.  12.Post-herpetic neuralgia.  13.Sleep apnea.  14.Vitamin D deficiency.  15.History of cerebrovascular accident.   PLAN:  Will admit the patient on medical bed with a diagnosis of  urinary  tract infection.  The patient is full code.  Will keep her n.p.o. except  medicine, ice chips, and water.  She can have medicines with juice.  Recheck vitals, input/output per unit protocol.  Will check CBC with  differential, CMP in the morning.  Will check UA, urine culture, blood  culture now.  We already have gotten x-ray of chest and abdomen today.  The patient has also blood drawn for CBC, CMP, amylase, lipase which are  pending at this time.  The patient will be started on Rocephin 1 gram IV  q.24h.  Will provide morphine 1 to 2 mg IV q.4h.  p.r.n. for pain.  Will  continue Lovenox 100 mg subcutaneously every night.  We will provide  Zofran 4 mg  IV q.4h. p.r.n. for nausea, vomiting.  Will continue  sotalol, Lumigan, Citracal, vitamin D, Claritin.  Will provide potassium  chloride, dorzolamide/timolol eye drops, vitamin D,  Diovan/hydrochlorothiazide, Lovenox, levothyroxine, Pravachol,  Neurontin, amitriptyline, Aciphex at home dose.  Will continue Darvocet-  N 100/650, folic acid, Cymbalta, Carbatrol, promethazine  Hydrocodone/acetaminophen, Imodium, and Ativan per home dose.  We will  hold cephalexin today.  We will give her dose of 40 mg IV Lasix and  watch her volume status.  Overall further plan according to the workup  pending.      Donalynn Furlong, MD  Electronically Signed     TVP/MEDQ  D:  11/11/2008  T:  11/11/2008  Job:  [id number]   cc:   Stacie Acres. Cliffton Asters, M.D.

## 2010-08-07 NOTE — Discharge Summary (Signed)
[name], [name]               ACCOUNT NO.:  [id number]   MEDICAL RECORD NO.:  [id number]          PATIENT TYPE:  INP   LOCATION:  3017                         FACILITY:  [facility]   PHYSICIAN:  Ramiro Harvest, MD    DATE OF BIRTH:  1930-02-21   DATE OF ADMISSION:  11/07/2008  DATE OF DISCHARGE:  11/08/2008                               DISCHARGE SUMMARY   PRIMARY CARE PHYSICIAN:  Dr. Laurann Montana of [facility].   DISCHARGE DIAGNOSES:  1. Pseudomonas urinary tract infection.  2. Recurrent deep vein thromboses with pulmonary embolus, status post      inferior vena cava filter.  3. History of glaucoma.  4. Migraines.  5. Hypoglycemia.  6. Gastroesophageal reflux disease/hiatal hernia.  7. Diverticulosis.  8. Atrial fibrillation.  9. Osteoarthritis.  10.Panic attacks.  11.Irritable bowel syndrome.  12.Hypertension.  13.Postherpetic neuralgia.  14.Goiter.  15.Hypothyroidism.  16.Obstructive sleep apnea, was supposed to be on continuous positive      airway pressure; however, the patient stated has not used      continuous positive airway pressure in a month.  17.History of cerebrovascular accident in 1965.  18.Status post breast biopsies, 1971 and 1986.  19.Status post cholecystectomy in 1984.  20.Status post hemorrhoidectomy in 1966.  21.Status post tonsillectomy in 1954.  22.Status post appendectomy in 1945.  23.Status post right foot cyst removal.  24.Status post inferior vena cava filter.  25.History of right facial postherpetic neuralgia.   DISCHARGE MEDICATIONS:  1. Ceftin 500 mg p.o. b.i.d. x5 more days.  2. Sotalol 120 mg p.o. b.i.d.  3. Lumigan 0.03% one drop to the affected eye every evening daily.  4. Citrucel Plus D 315/200 p.o. daily.  5. Claritin 10 mg p.o. daily p.r.n.  6. Potassium chloride 20 mEq p.o. 3 times weekly.  7. Dorzolamide/timolol 2/0.5% one drop to the right eye twice daily.  8. Ativan 0.5 mg 2 tablets p.o. p.r.n.  9. Meclizine 25 mg  p.o. t.i.d. p.r.n.  10.Imodium AD 2 mg p.o. p.r.n.  11.EpiPen 0.3 mg/ 0.3 mL injection as directed.  12.Vitamin D 50,000 units 1 capsule weekly.  13.Diovan/ACR 80/12.5 mg p.o. daily.  14.Lovenox 100 mg subcutaneously q.h.s.  15.Levothyroxine 25 mcg p.o. q.a.m.  16.Pravachol 40 mg p.o. daily.  17.Neurontin 300 mg p.o. t.i.d.  18.Amitriptyline 10 mg p.o. q.h.s., which the patient states has not      used in the past month.  19.Aciphex 20 mg p.o. daily.  20.Lasix 40 mg p.o. 3 times weekly.  21.Darvocet-N 100/650 p.o. q.6 h. p.r.n. pain.  22.Folic acid 1 mg p.o. daily.  23.Hydrocodone/APAP 7.5/500 p.o. q.6 h. p.r.n. pain.  24.Cymbalta 60 mg p.o. daily.  25.Carbatrol 100 mg 2 tablets p.o. b.i.d.   DISPOSITION AND FOLLOWUP:  The patient will be discharged home.  The  patient will follow up with PCP in 1 week.  On followup, a repeat  urinalysis will need to be done for resolution of the UTI.   PROCEDURES PERFORMED:  None.   CONSULTATIONS DONE:  None.   BRIEF ADMISSION HISTORY AND PHYSICAL:  [name] is a 77-year-  old white female who presented to her PCP November 04, 2008, with  complaints of increased urinary frequency and nausea.  Urine cultures  which were at that time done revealed a Pseudomonas.  Patient with  multiple ANTIBIOTIC allergies and as such, we were asked to admit the  patient for IV antibiotics and further evaluation and treatment.  The  patient did report that she had some symptoms of nausea, which have  improved, and had a low-grade temperature of 99.9 on the morning of  admission.   PHYSICAL EXAMINATION:  Temperature of 97.8, blood pressure 125/58, pulse  of 54, respirations 18, saturating 97% on room air.  GENERAL:  The patient is a well-developed, well-nourished female in no  apparent distress.  HEENT:  Normocephalic, atraumatic.  Pupils equal, round and reactive to  light and accommodation.  Extraocular movements intact.  Oropharynx is  clear.  No  lesions.  No exudate.  NECK:  Supple.  No lymphadenopathy.  RESPIRATORY:  Lungs are clear to auscultation bilaterally.  CARDIOVASCULAR:  Regular rate and rhythm.  No murmurs, rubs or gallops.  ABDOMEN:  Soft, nontender, nondistended.  Positive bowel sounds.  EXTREMITIES:  No clubbing, cyanosis or edema.  NEUROLOGIC:  The patient is alert and oriented x3.  Cranial nerves II-  XII were grossly intact.  No focal deficits.   HOSPITAL COURSE:  Pseudomonas urinary tract infection.  Per  sensitivities obtained from her PCP's office, the patient's Pseudomonas  was pansensitive.  The patient remained stable.  The patient did have a  history of allergy to LEVOFLOXACIN as well, and as such the patient was  placed on IV Rocephin.  It was noted per patient and per PCP that the  patient did have a slight allergy to PENICILLIN, which was causing a  rash; however, it was noted on the patient's medication list that the  patient had been tolerating cephalexin twice daily and was doing fine  with that.  The patient was thus placed on IV Rocephin.  The patient was  able to the tolerate IV Rocephin without any complications or any side  effects from it.  The patient remained stable.  The patient remained  afebrile.  CBC which was obtained revealed a normal white count and the  patient was in stable and improved condition on the day of discharge.  The patient will be discharged home on Ceftin 500 mg twice daily x5 more  days, as the patient will receive 1 more dose of Rocephin IV prior to  discharge.  The patient will complete the Ceftin for 5 more days to  complete a 7-day course of antibiotic treatment for her Pseudomonas UTI.  The patient will need to follow up with PCP 1 week post discharge to be  reassessed and repeat UA for resolution of her urinary tract infection.   The rest of the patient's chronic medical issues remained stable  throughout the hospitalization.  The patient will be discharged in   stable and improved condition.   On the day of discharge, vital signs:  Temperature 97.9, pulse of 57,  blood pressure 120/61, respirations 18, saturating 99% on room air.   DISCHARGE LABS:  Sodium 135, potassium 3.9, chloride 102, bicarb 25,  glucose 97, BUN 16, creatinine 1.30, calcium of 9.5.  CBC with a white  count of 6.2, hemoglobin of 10.5, hematocrit of 31.1, platelet count of  129, ANC of 3.0.   It was a pleasure taking care of [name].  Ramiro Harvest, MD  Electronically Signed     DT/MEDQ  D:  11/08/2008  T:  11/08/2008  Job:  [id number]   cc:   Stacie Acres. Cliffton Asters, M.D.

## 2010-08-07 NOTE — H&P (Signed)
[name], [name]               ACCOUNT NO.:  [id number]   MEDICAL RECORD NO.:  [id number]          PATIENT TYPE:  INP   LOCATION:  3017                         FACILITY:  [facility]   PHYSICIAN:  Ramiro Harvest, MD    DATE OF BIRTH:  1929/04/27   DATE OF ADMISSION:  11/07/2008  DATE OF DISCHARGE:                              HISTORY & PHYSICAL   CHIEF COMPLAINT:  Urinary frequency and nausea as well as low-grade  temperature.   HISTORY OF PRESENT ILLNESS:  [name] is a 77 year old white female  who presented to her primary MD on November 04, 2008 with complaint of  urinary frequency and nausea.  Urine culture taken at that time,  revealed Pseudomonas.  The patient has multiple antibiotic allergies.  We were asked to admit the patient for further evaluation and treatment.  The patient does report that her symptoms of frequency and nausea have  improved, which she did have a low-grade temperature, early this morning  of 99.   ALLERGIES:  1. AMBIEN.  2. IVP DYE causes hives.  3. VOLTAREN causes reflux.  4. EYE DROPS are preservative.  5. COMBIGAN.  6. CIPRO causes rash.  7. CELEBREX causes rash.  8. BIAXIN.  9. BETADINE.  10.BENADRYL.  11.SHELLFISH causes anaphylaxis.  12.SEPTRA causes rash.  13.PLAVIX causes rash.  14.PENICILLIN causes rash.  15.PATANOL.  16.NAPROSYN causes rash.  17.LEVAQUIN causes rash.  18.COUMADIN causes rash.   PAST MEDICAL HISTORY:  1. Recurrent DVTs with PE, status post IVC filter.  2. Glaucoma.  3. Migraines.  4. Hypoglycemia.  5. GERD/hiatal hernia.  6. Diverticulosis.  7. Atrial fibrillation.  8. Osteoarthritis.  9. Panic attacks.  10.Irritable bowel syndrome.  11.Hypertension.  12.Postherpetic neuralgia.  13.Goiter.  14.Hypothyroidism.  15.Obstructive sleep apnea on CPAP.  16.History of CVA in 1965.   PAST SURGICAL HISTORY:  1. Breast biopsies in 1971 and 1986.  2. Cholecystectomy in 1984.  3. Hemorrhoidectomy in 1966.  4,   Tonsillectomy in 1954.  1. Appendectomy in 1945.  2. Cyst removal, right foot.  3. IVC filter.   FAMILY HISTORY:  Her mother had history of diabetes, glaucoma, CHF,  hypertension, coronary artery disease, and CVA.  Her father had  hypertension.  Maternal grandfather had stomach cancer.  Brother  hypertension and CVA.  Maternal uncle had rectal cancer and colon  cancer.  Maternal aunt had breast cancer.   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  She is divorced.  Denies tobacco.  She notes occasional wine.   MEDICATIONS:  1. Sotalol hydrochloride 120 mg one tablet p.o. b.i.d.  2. Lumigan 0.03% one drop injected to the affected eye every evening      once a day.  3. Citracal plus D 315 - 200 p.o. daily.  4. Claritin 10 mg p.o. daily p.r.n.  5. Potassium chloride 20 mEq one tablet p.o. three times weekly.  6. Dorzolamide - timolol 2 - 0.5% one drop into right eye twice daily.  7. Ativan 0.5 mg two tablets p.o. p.r.n.  8. Meclizine hydrochloride 25 mg one tablet p.o. t.i.d.  p.r.n.  9. Imodium A-D 2 mg one tablet p.o. p.r.n.  10.EpiPen 0.3 mg/0.3 mL injection as directed.  11.Vitamin D 50,000 units one capsule p.o. weekly.  12.Diovan HCT 80 - 12.5 mg one tablet p.o. daily.  13.Lovenox 100 mg subcu at bedtime.  14.Levothyroxine 25 mcg p.o. every morning.  15.Pravachol 40 mg p.o. daily.  16.Neurontin 300 mg p.o. t.i.d.  17.Amitriptyline hydrochloride 10 mg p.o. at bedtime.  18.Aciphex 20 mg p.o. daily.  19.Lasix 40 mg p.o. three time weekly.  20.Darvocet-N 100 100 - 650 one tablet p.o. as needed q.6 h. p.r.n.      pain.  21.Folic acid 1 mg p.o. daily.  22.Hydrocodone - acetaminophen 7.5 - 500 mg one tablet p.o. q.6 h.      p.r.n. pain.  23.Cephalexin 500 mg p.o. b.i.d.  24.Cymbalta 60 mg p.o. daily.  25.Carbatrol 100 mg two capsules p.o. b.i.d.   REVIEW OF SYSTEMS:  GENERAL:  Notes positive fever of 99 early this  morning, positive chills.  ENT:  Reports scratchy  throat.  CARDIOVASCULAR:  Denies chest pain, did note positive palpitations last  night.  RESPIRATORY:  Positive cough with clear sputum.  GI:  Positive  nausea which is improved, denies vomiting, had four loose stools this  morning, resolved after Imodium.  GU: Denies dysuria or hematuria.  PSYCHIATRIC:  Anxiety is well controlled, does note mild depression.  NEURO:  Positive postherpetic neuralgia, right eye, improved with recent  addition of Carbatrol.  Denies visual changes.  MUSCULOSKELETAL:  Chronic OA at baseline, did have joint aches on Thursday and Friday of  last week.  HEME:  Notes easy bruisability, denies bleed.   LABORATORY/RADIOLOGY:  These are pending.   PHYSICAL EXAMINATION:  VITAL SIGNS:  BP 125/58, heart rate 54,  respiratory rate 18, temperature 97.8, O2 sat 97% on room air.  HEAD:  Normocephalic, atraumatic.  GENERAL:  Awake, alert elderly white female in no acute distress.  ENT:  Moist oral mucosa.  NECK:  Supple.  No masses.  No JVD.  CARDIOVASCULAR:  S1, S2, regular rate and rhythm, 1+ bilateral lower  extremity edema.  RESPIRATORY:  Breath sounds are clear to auscultation bilaterally  without wheezes, rales, or rhonchi.  No increased work of breathing.  ABDOMEN:  Soft, nontender, nondistended.  Positive bowel sounds noted.  SKIN:  No rashes.  MUSCULOSKELETAL:  No joint swelling or effusion.  PSYCHIATRIC:  A and O x3, pleasant.   ASSESSMENT AND PLAN:  1. Pseudomonas, urinary tract infection, pansensitive.  If she is      clinically stable, we will give Rocephin IV if tolerates.  Her      remains stable overnight, anticipate discharge to home on November 08, 2008 on oral Ceftin.  We will check a CBC and BMET this      afternoon.  Chart indicates that the patient's reaction to      penicillin is rash.  2. History of recurrent deep venous thromboses with pulmonary      embolism.  Continue daily Lovenox injections.  3. History of atrial fibrillation, rate  is stable.  Continue sotalol      and Lovenox.  4. Osteoarthritis, stable.  5. Migraines, stable.  6. Glaucoma, stable.  Continue home drops.  7. Gastroesophageal reflux disease/hiatal hernia, stable on proton      pump inhibitor.  Continue the same.  8. Diverticulosis, stable.  9. Postherpetic neuralgia.  Continue Carbatrol.  Symptoms are  improving per the patient.  10.Anxiety/depression.  This is stable on Cymbalta and Ativan p.r.n.  11.History of goiter/hypothyroid.  This is stable.  Check TSH.      Continue Synthroid.  12.Irritable bowel syndrome, had four loose stools last night,      monitor.  This was resolved with status post Imodium.  13.Hypertension, stable.  Continue Diovan HCT.  14.Obstructive sleep apnea.  We will continue CPAP at bedtime at home      setting.  15.History of cerebrovascular accident, remote, currently at baseline.  16.Dyslipidemia.  Continue statin.  17.Chronic systolic heart failure.  Ejection fraction 55% per 2-D echo      in 2008 with mild hypokinesis of the mid      distal septal wall.  She takes Lasix three times weekly, and notes      increasing lower extremity edema.  Therefore, we will change her      with Lasix to daily at this point and monitor next.  18.Disposition.  Anticipate discharge to home on November 08, 2008 if      stable.      Sandford Craze, NP      Ramiro Harvest, MD  Electronically Signed    MO/MEDQ  D:  11/07/2008  T:  11/08/2008  Job:  [id number]   cc:   Stacie Acres. Cliffton Asters, M.D.

## 2010-08-10 NOTE — Consult Note (Signed)
[name], [name]               ACCOUNT NO.:  [id number]   MEDICAL RECORD NO.:  [id number]          PATIENT TYPE:  INP   LOCATION:  3708                         FACILITY:  [facility]   PHYSICIAN:  Lennis P. Darrold Span, M.D.DATE OF BIRTH:  November 23, 1929   DATE OF CONSULTATION:  07/11/2006  DATE OF DISCHARGE:                                 CONSULTATION   HEMATOLOGY/ONCOLOGY CONSULTATION NOTE:   HISTORY OF PRESENT ILLNESS:  The patient is a 77 year old white female  with a history of coagulopathy, sent at the request of the hospitalist  service with a new DVT below her IVC filter, which is involving distal  IVC to iliac bifurcation.  The patient has previously been seen at our  office by Dr. Arbutus Ped and has been followed by primary physician,  Laurann Montana, and Chanda Busing for cardiology.   Patient has a history of phlebitis with pregnancies in the 81s.  In  one dictation available, there is mention of DVT and PEs in 1992,  however, the patient and other records suggest that 2003 bilateral PEs  and lower extremity DVT was the first presentation.  In 2003, she was  treated with heparin, then Lovenox, then subsequently as an outpatient  went onto Coumadin until she developed a allergic rash.  The patient  recalls the rash is nonpuritic, nonpalpable, red areas from her neck to  her back, but I have no other information on this presently.  One of the  dictations states that she was rechallenged with Coumadin and again  had allergic reaction.  I do not have information concerning that.  She  was either off anticoagulation or on aspirin alone in January of 2007,  when she again had extensive pulmonary emboli.  She was discharged on  Lovenox, then readmitted about a week later with cellulitis or  abscess at the Lovenox injection site at her abdomen.  An IVC filter  was placed then; however, full records including H&P and discharge  summary from that admission are not available to me.  I  believe that she  was discharged on Ticlid.  Coagulation workup, in February of 2007, is  detailed in Dr. Asa Lente note of March 05, 2006, attached.  Patient  and/or her other physicians were reluctant to maintain her on low-dose  Lovenox, and I believe that she has been on Ticlid and baby aspirin most  recently until this admission.  The patient does tell me that she has  used Lovenox injections or half an injection prior to traveling during  the past year.   Over the past week or so, prior to this admission, patient had developed  progressive swelling in her lower extremities and dyspnea on exertion.  Because of IV contrast allergy, she had a VQ scan done outpatient, July 07, 2006, which was negative for pulmonary emboli.  A CT of abdomen and  pelvis, without contrast, July 10, 2006, had an unremarkable abdomen,  the apparent DVT below the IVC filter extending into the iliac  bifurcation, and also distended left ovarian vein thought collateral  flow and diverticulosis.  Patient was begun on  IV heparin, dosed by  pharmacy on April 17.  She developed slurred speech on April 18 a.m. and  heparin was held until head CT, without contrast, showed no acute  abnormality.  Heparin has been resumed.  Patient has not felt short of  breath with limited activity today.  Her lower extremity swelling has  improved and she had not had any gross bleeding.   REVIEW OF SYSTEMS:  Extensive bruising since on Ticlid.  No pain  except arthritis, low back and knees.  Bowels have been unchanged  without obvious bleeding.  No cough.   PAST MEDICAL HISTORY:  1. She has multiple drug allergies and intolerances listed.  2. Congestive heart failure.  3. Hypertension.  4. Arrhythmia listed.  5. Gastroesophageal reflux disease.  6. She had nonneoplastic goiter documented by fine needle aspiration      of the thyroid in March of 2007.   EXAMINATION:  GENERAL:  She is awake, alert, very pleasant and   cooperative.  She seems to have some difficulty with expressive aphasia  at times, but speech is not noticeably slurred.  She is extremely obese,  weight listed 104 kilograms.  VITAL SIGNS:  Temperature 98.7.  Heart rate 73 and regular.  Respirations 20.  Blood pressure 135/59.  Room air saturation 93%.  HEENT:  Pupils equal and reactive.  Not icteric.  Extraocular movements  intact.  No peripheral adenopathy.  LUNGS:  Without wheezes or rales.  Though diminished breath sounds in  the lower fields bilaterally.  She has about a 9 x 5 cm  ecchymosis/hematoma left lateral abdomen.  She has multiple other  bruises on her back.  She has multiple other bruises on her back.  Large  area, right anterior lower leg.  HEART:  Regular rate and rhythm.  ABDOMEN:  Obese, soft.  Nothing palpable.  LOWER EXTREMITIES:  Are not tightly swollen and not tender.   LABORATORIES:  From April 17, white count 10.7, ANC 8.0, hemoglobin  10.0, platelets 133, INR 1.1, sodium 136, potassium 4.4, chloride 108,  glucose 122, BUN 17, creatinine 1.4, total bili 1.1, alkaline  phosphatase 104, GOT 21, GPT 14, total protein 6.8, albumin 3.5.   IMPRESSION/RECOMMENDATION:  1. Systematic extensive clotting below IVC filter:  Agree with IV      heparin with close monitoring by pharmacy until maximum benefit is      achieved from the standpoint of the leg swelling.  Long term      management will clearly be difficult as she will certainly reclot      without anticoagulation and clearly could have more complications      from treatment.  We will discuss with Dr. Arbutus Ped and others of our      group as needed.  2. Still some speech abnormalities that may need further evaluation.  3. Mild thrombocytopenia on admission with followup CBCs ordered by      pharmacy.  4. Whatever history of cardiac problems.  5. History of multiple drug intolerances or allergies, which from the     standpoint of the Lovenox and Coumadin may or  may not be      significant enough to preclude use with these.   Thank you for the consultation.      Lennis P. Darrold Span, M.D.  Electronically Signed     LPL/MEDQ  D:  07/11/2006  T:  07/12/2006  Job:  [id number]   cc:   Lajuana Matte, MD  Stacie Acres. White,  M.D.  Madaline Savage, M.D.

## 2010-08-10 NOTE — Cardiovascular Report (Signed)
[name], [name]               ACCOUNT NO.:  [id number]   MEDICAL RECORD NO.:  [id number]          PATIENT TYPE:  INP   LOCATION:  2031                         FACILITY:  [facility]   PHYSICIAN:  Cristy Hilts. Jacinto Halim, MD       DATE OF BIRTH:  [birth year]   DATE OF PROCEDURE:  05/02/2005  DATE OF DISCHARGE:                              CARDIAC CATHETERIZATION   PROCEDURE PERFORMED:  1.  Right heart catheterization.  2.  Placement of TrapEase IVC filter.   INDICATIONS FOR PROCEDURE:  Ms. Cherita Hebel is a 77 year old female with a  history of pulmonary embolism in 1992 and was put on Coumadin and has  developed a severe rash throughout the body and had to be discontinued.  She  was just on aspirin only.  She was readmitted to the hospital on April 18, 2005, with recurrent pulmonary embolism and was discharged home on Lovenox  subcu chronically.  Because of allergy to the Coumadin, she was advised  Lovenox therapy on a long term basis.  However, the patient developed  cellulitis of her injection site and, also, patient now states that she will  not be able to take anymore Lovenox.  Given the fact that she is  hypercoagulable the fact of IVC filter is probably a good idea and to  proceed with the same.  After having discussed the pros and cons and  alternatives with the patient and the daughter, who was present at the  bedside, also discussing the case with Dr. Laurann Montana, who is her primary  care physician, we decided to electively proceed with placement of IVC  filter.  Right heart catheterization was performed to evaluate for pulmonary  hypertension as the patient has history of prior PE and has chronic  shortness of breath.  Prior to placement of IVC filter, it was felt that it  was probably prudent to proceed with right heart catheterization.   TECHNIQUES OF PROCEDURE:  Right heart catheterization with the usual sterile  precautions, using a 7 French right femoral venous access, a  balloon tip  Swan-Ganz catheter was advanced into the inferior vena cava and then into  the right atrium and then pulmonary artery was cannulated and pulmonary  capillary wedge was easily obtained.  Right heart hemodynamics was carefully  analyzed and then the catheter was pulled out of the body in the usual  fashion.   TECHNIQUES OF IVC FILTER PLACEMENT:  The 7 French right femoral venous  sheath was exchanged for a 6 [place] long TrapEase introducer sheath.  This  introducer sheath was carefully positioned in the inferior vena cava and  inferior vena cavogram was performed.  The origin of the renal veins were  carefully located.  Then, under fluoroscopic guidance, the TrapEase device  was carefully advanced into the introducer sheath and an introducer sheath  was carefully pulled back in the usual fashion with excellent deployment of  the IVC filter.  Post deployment  IVC gram revealed excellent position and apposition to the wall of the IVC.  The renal veins were also well visualized superior to  the IVC filter.  Overall, the patient tolerated the procedure well.  A total of 50 mL of  contrast was utilized for angiography.  No immediate complications were  noted.      Cristy Hilts. Jacinto Halim, MD  Electronically Signed     JRG/MEDQ  D:  05/03/2005  T:  05/03/2005  Job:  [id number]   cc:   Stacie Acres. Cliffton Asters, M.D.  Fax: [phone]   Madaline Savage, M.D.  Fax: [phone]

## 2011-01-18 ENCOUNTER — Other Ambulatory Visit: Payer: Self-pay | Admitting: Neurology

## 2011-01-18 DIAGNOSIS — G3184 Mild cognitive impairment, so stated: Secondary | ICD-10-CM

## 2011-01-18 DIAGNOSIS — R404 Transient alteration of awareness: Secondary | ICD-10-CM

## 2011-01-18 DIAGNOSIS — G459 Transient cerebral ischemic attack, unspecified: Secondary | ICD-10-CM

## 2011-01-22 ENCOUNTER — Ambulatory Visit
Admission: RE | Admit: 2011-01-22 | Discharge: 2011-01-22 | Disposition: A | Discharge status: Home / Self Care | Payer: Federal, State, Local not specified - PPO | Source: Ambulatory Visit | Attending: Neurology | Admitting: Neurology

## 2011-01-22 DIAGNOSIS — R404 Transient alteration of awareness: Secondary | ICD-10-CM

## 2011-01-22 DIAGNOSIS — G3184 Mild cognitive impairment, so stated: Secondary | ICD-10-CM

## 2011-01-22 DIAGNOSIS — G459 Transient cerebral ischemic attack, unspecified: Secondary | ICD-10-CM

## 2011-04-11 DIAGNOSIS — T887XXA Unspecified adverse effect of drug or medicament, initial encounter: Secondary | ICD-10-CM | POA: Diagnosis not present

## 2011-04-11 DIAGNOSIS — H4011X0 Primary open-angle glaucoma, stage unspecified: Secondary | ICD-10-CM | POA: Diagnosis not present

## 2011-04-23 DIAGNOSIS — G459 Transient cerebral ischemic attack, unspecified: Secondary | ICD-10-CM | POA: Diagnosis not present

## 2011-04-23 DIAGNOSIS — G500 Trigeminal neuralgia: Secondary | ICD-10-CM | POA: Diagnosis not present

## 2011-04-23 DIAGNOSIS — R404 Transient alteration of awareness: Secondary | ICD-10-CM | POA: Diagnosis not present

## 2011-04-23 DIAGNOSIS — R269 Unspecified abnormalities of gait and mobility: Secondary | ICD-10-CM | POA: Diagnosis not present

## 2011-04-30 DIAGNOSIS — Q828 Other specified congenital malformations of skin: Secondary | ICD-10-CM | POA: Diagnosis not present

## 2011-04-30 DIAGNOSIS — M79609 Pain in unspecified limb: Secondary | ICD-10-CM | POA: Diagnosis not present

## 2011-04-30 DIAGNOSIS — B351 Tinea unguium: Secondary | ICD-10-CM | POA: Diagnosis not present

## 2011-05-10 DIAGNOSIS — H4011X0 Primary open-angle glaucoma, stage unspecified: Secondary | ICD-10-CM | POA: Diagnosis not present

## 2011-05-13 DIAGNOSIS — H4011X0 Primary open-angle glaucoma, stage unspecified: Secondary | ICD-10-CM | POA: Diagnosis not present

## 2011-05-16 DIAGNOSIS — Z1231 Encounter for screening mammogram for malignant neoplasm of breast: Secondary | ICD-10-CM | POA: Diagnosis not present

## 2011-06-10 DIAGNOSIS — Z79899 Other long term (current) drug therapy: Secondary | ICD-10-CM | POA: Diagnosis not present

## 2011-06-10 DIAGNOSIS — I10 Essential (primary) hypertension: Secondary | ICD-10-CM | POA: Diagnosis not present

## 2011-06-10 DIAGNOSIS — K219 Gastro-esophageal reflux disease without esophagitis: Secondary | ICD-10-CM | POA: Diagnosis not present

## 2011-06-10 DIAGNOSIS — R197 Diarrhea, unspecified: Secondary | ICD-10-CM | POA: Diagnosis not present

## 2011-06-12 DIAGNOSIS — I82409 Acute embolism and thrombosis of unspecified deep veins of unspecified lower extremity: Secondary | ICD-10-CM | POA: Diagnosis not present

## 2011-06-12 DIAGNOSIS — I129 Hypertensive chronic kidney disease with stage 1 through stage 4 chronic kidney disease, or unspecified chronic kidney disease: Secondary | ICD-10-CM | POA: Diagnosis not present

## 2011-06-12 DIAGNOSIS — G500 Trigeminal neuralgia: Secondary | ICD-10-CM | POA: Diagnosis not present

## 2011-06-12 DIAGNOSIS — I4891 Unspecified atrial fibrillation: Secondary | ICD-10-CM | POA: Diagnosis not present

## 2011-06-12 DIAGNOSIS — I6529 Occlusion and stenosis of unspecified carotid artery: Secondary | ICD-10-CM | POA: Diagnosis not present

## 2011-06-14 DIAGNOSIS — H4011X0 Primary open-angle glaucoma, stage unspecified: Secondary | ICD-10-CM | POA: Diagnosis not present

## 2011-07-18 DIAGNOSIS — H4011X0 Primary open-angle glaucoma, stage unspecified: Secondary | ICD-10-CM | POA: Diagnosis not present

## 2011-07-18 DIAGNOSIS — H409 Unspecified glaucoma: Secondary | ICD-10-CM | POA: Diagnosis not present

## 2011-07-30 DIAGNOSIS — B351 Tinea unguium: Secondary | ICD-10-CM | POA: Diagnosis not present

## 2011-07-30 DIAGNOSIS — Q828 Other specified congenital malformations of skin: Secondary | ICD-10-CM | POA: Diagnosis not present

## 2011-07-30 DIAGNOSIS — M79609 Pain in unspecified limb: Secondary | ICD-10-CM | POA: Diagnosis not present

## 2011-08-16 DIAGNOSIS — M722 Plantar fascial fibromatosis: Secondary | ICD-10-CM | POA: Diagnosis not present

## 2011-09-13 DIAGNOSIS — M722 Plantar fascial fibromatosis: Secondary | ICD-10-CM | POA: Diagnosis not present

## 2011-09-18 DIAGNOSIS — K219 Gastro-esophageal reflux disease without esophagitis: Secondary | ICD-10-CM | POA: Diagnosis not present

## 2011-09-18 DIAGNOSIS — E039 Hypothyroidism, unspecified: Secondary | ICD-10-CM | POA: Diagnosis not present

## 2011-09-18 DIAGNOSIS — I129 Hypertensive chronic kidney disease with stage 1 through stage 4 chronic kidney disease, or unspecified chronic kidney disease: Secondary | ICD-10-CM | POA: Diagnosis not present

## 2011-09-18 DIAGNOSIS — N183 Chronic kidney disease, stage 3 unspecified: Secondary | ICD-10-CM | POA: Diagnosis not present

## 2011-09-18 DIAGNOSIS — D649 Anemia, unspecified: Secondary | ICD-10-CM | POA: Diagnosis not present

## 2011-09-18 DIAGNOSIS — E785 Hyperlipidemia, unspecified: Secondary | ICD-10-CM | POA: Diagnosis not present

## 2011-09-18 DIAGNOSIS — R350 Frequency of micturition: Secondary | ICD-10-CM | POA: Diagnosis not present

## 2011-09-18 DIAGNOSIS — I82409 Acute embolism and thrombosis of unspecified deep veins of unspecified lower extremity: Secondary | ICD-10-CM | POA: Diagnosis not present

## 2011-10-17 DIAGNOSIS — H4011X0 Primary open-angle glaucoma, stage unspecified: Secondary | ICD-10-CM | POA: Diagnosis not present

## 2011-10-17 DIAGNOSIS — H409 Unspecified glaucoma: Secondary | ICD-10-CM | POA: Diagnosis not present

## 2011-10-23 DIAGNOSIS — G459 Transient cerebral ischemic attack, unspecified: Secondary | ICD-10-CM | POA: Diagnosis not present

## 2011-10-23 DIAGNOSIS — G500 Trigeminal neuralgia: Secondary | ICD-10-CM | POA: Diagnosis not present

## 2011-10-23 DIAGNOSIS — G3184 Mild cognitive impairment, so stated: Secondary | ICD-10-CM | POA: Diagnosis not present

## 2011-10-23 DIAGNOSIS — R269 Unspecified abnormalities of gait and mobility: Secondary | ICD-10-CM | POA: Diagnosis not present

## 2011-11-08 DIAGNOSIS — L84 Corns and callosities: Secondary | ICD-10-CM | POA: Diagnosis not present

## 2011-11-08 DIAGNOSIS — M79609 Pain in unspecified limb: Secondary | ICD-10-CM | POA: Diagnosis not present

## 2011-11-08 DIAGNOSIS — B351 Tinea unguium: Secondary | ICD-10-CM | POA: Diagnosis not present

## 2011-11-14 DIAGNOSIS — H409 Unspecified glaucoma: Secondary | ICD-10-CM | POA: Diagnosis not present

## 2011-11-14 DIAGNOSIS — H4011X0 Primary open-angle glaucoma, stage unspecified: Secondary | ICD-10-CM | POA: Diagnosis not present

## 2011-12-17 DIAGNOSIS — E559 Vitamin D deficiency, unspecified: Secondary | ICD-10-CM | POA: Diagnosis not present

## 2011-12-17 DIAGNOSIS — Z0000 Encounter for general adult medical examination without abnormal findings: Secondary | ICD-10-CM | POA: Diagnosis not present

## 2011-12-17 DIAGNOSIS — N390 Urinary tract infection, site not specified: Secondary | ICD-10-CM | POA: Diagnosis not present

## 2011-12-17 DIAGNOSIS — E669 Obesity, unspecified: Secondary | ICD-10-CM | POA: Diagnosis not present

## 2011-12-17 DIAGNOSIS — Z1331 Encounter for screening for depression: Secondary | ICD-10-CM | POA: Diagnosis not present

## 2011-12-17 DIAGNOSIS — N183 Chronic kidney disease, stage 3 unspecified: Secondary | ICD-10-CM | POA: Diagnosis not present

## 2011-12-17 DIAGNOSIS — Z23 Encounter for immunization: Secondary | ICD-10-CM | POA: Diagnosis not present

## 2011-12-17 DIAGNOSIS — M2550 Pain in unspecified joint: Secondary | ICD-10-CM | POA: Diagnosis not present

## 2011-12-26 DIAGNOSIS — H2510 Age-related nuclear cataract, unspecified eye: Secondary | ICD-10-CM | POA: Diagnosis not present

## 2011-12-26 DIAGNOSIS — Z961 Presence of intraocular lens: Secondary | ICD-10-CM | POA: Diagnosis not present

## 2012-01-22 DIAGNOSIS — M25569 Pain in unspecified knee: Secondary | ICD-10-CM | POA: Diagnosis not present

## 2012-01-23 DIAGNOSIS — S8000XA Contusion of unspecified knee, initial encounter: Secondary | ICD-10-CM | POA: Diagnosis not present

## 2012-01-23 DIAGNOSIS — M7040 Prepatellar bursitis, unspecified knee: Secondary | ICD-10-CM | POA: Diagnosis not present

## 2012-02-11 DIAGNOSIS — M79609 Pain in unspecified limb: Secondary | ICD-10-CM | POA: Diagnosis not present

## 2012-02-11 DIAGNOSIS — B351 Tinea unguium: Secondary | ICD-10-CM | POA: Diagnosis not present

## 2012-02-11 DIAGNOSIS — L84 Corns and callosities: Secondary | ICD-10-CM | POA: Diagnosis not present

## 2012-02-12 DIAGNOSIS — H4011X0 Primary open-angle glaucoma, stage unspecified: Secondary | ICD-10-CM | POA: Diagnosis not present

## 2012-02-12 DIAGNOSIS — H409 Unspecified glaucoma: Secondary | ICD-10-CM | POA: Diagnosis not present

## 2012-03-09 DIAGNOSIS — N183 Chronic kidney disease, stage 3 unspecified: Secondary | ICD-10-CM | POA: Diagnosis not present

## 2012-03-09 DIAGNOSIS — R29818 Other symptoms and signs involving the nervous system: Secondary | ICD-10-CM | POA: Diagnosis not present

## 2012-03-09 DIAGNOSIS — D649 Anemia, unspecified: Secondary | ICD-10-CM | POA: Diagnosis not present

## 2012-03-09 DIAGNOSIS — E785 Hyperlipidemia, unspecified: Secondary | ICD-10-CM | POA: Diagnosis not present

## 2012-03-09 DIAGNOSIS — E559 Vitamin D deficiency, unspecified: Secondary | ICD-10-CM | POA: Diagnosis not present

## 2012-03-09 DIAGNOSIS — I129 Hypertensive chronic kidney disease with stage 1 through stage 4 chronic kidney disease, or unspecified chronic kidney disease: Secondary | ICD-10-CM | POA: Diagnosis not present

## 2012-04-07 DIAGNOSIS — H409 Unspecified glaucoma: Secondary | ICD-10-CM | POA: Diagnosis not present

## 2012-04-07 DIAGNOSIS — H4011X0 Primary open-angle glaucoma, stage unspecified: Secondary | ICD-10-CM | POA: Diagnosis not present

## 2012-04-08 DIAGNOSIS — R262 Difficulty in walking, not elsewhere classified: Secondary | ICD-10-CM | POA: Diagnosis not present

## 2012-04-08 DIAGNOSIS — M6281 Muscle weakness (generalized): Secondary | ICD-10-CM | POA: Diagnosis not present

## 2012-04-15 DIAGNOSIS — R262 Difficulty in walking, not elsewhere classified: Secondary | ICD-10-CM | POA: Diagnosis not present

## 2012-04-15 DIAGNOSIS — M6281 Muscle weakness (generalized): Secondary | ICD-10-CM | POA: Diagnosis not present

## 2012-04-17 DIAGNOSIS — M6281 Muscle weakness (generalized): Secondary | ICD-10-CM | POA: Diagnosis not present

## 2012-04-17 DIAGNOSIS — R262 Difficulty in walking, not elsewhere classified: Secondary | ICD-10-CM | POA: Diagnosis not present

## 2012-04-24 DIAGNOSIS — M6281 Muscle weakness (generalized): Secondary | ICD-10-CM | POA: Diagnosis not present

## 2012-04-24 DIAGNOSIS — R262 Difficulty in walking, not elsewhere classified: Secondary | ICD-10-CM | POA: Diagnosis not present

## 2012-05-01 DIAGNOSIS — M6281 Muscle weakness (generalized): Secondary | ICD-10-CM | POA: Diagnosis not present

## 2012-05-01 DIAGNOSIS — R262 Difficulty in walking, not elsewhere classified: Secondary | ICD-10-CM | POA: Diagnosis not present

## 2012-05-04 DIAGNOSIS — D696 Thrombocytopenia, unspecified: Secondary | ICD-10-CM | POA: Diagnosis not present

## 2012-05-04 DIAGNOSIS — R197 Diarrhea, unspecified: Secondary | ICD-10-CM | POA: Diagnosis not present

## 2012-05-04 DIAGNOSIS — N390 Urinary tract infection, site not specified: Secondary | ICD-10-CM | POA: Diagnosis not present

## 2012-05-05 DIAGNOSIS — M6281 Muscle weakness (generalized): Secondary | ICD-10-CM | POA: Diagnosis not present

## 2012-05-05 DIAGNOSIS — R262 Difficulty in walking, not elsewhere classified: Secondary | ICD-10-CM | POA: Diagnosis not present

## 2012-05-11 DIAGNOSIS — M6281 Muscle weakness (generalized): Secondary | ICD-10-CM | POA: Diagnosis not present

## 2012-05-11 DIAGNOSIS — R262 Difficulty in walking, not elsewhere classified: Secondary | ICD-10-CM | POA: Diagnosis not present

## 2012-05-15 DIAGNOSIS — L84 Corns and callosities: Secondary | ICD-10-CM | POA: Diagnosis not present

## 2012-05-15 DIAGNOSIS — M79609 Pain in unspecified limb: Secondary | ICD-10-CM | POA: Diagnosis not present

## 2012-05-15 DIAGNOSIS — B351 Tinea unguium: Secondary | ICD-10-CM | POA: Diagnosis not present

## 2012-05-18 DIAGNOSIS — Z1231 Encounter for screening mammogram for malignant neoplasm of breast: Secondary | ICD-10-CM | POA: Diagnosis not present

## 2012-05-18 DIAGNOSIS — Z803 Family history of malignant neoplasm of breast: Secondary | ICD-10-CM | POA: Diagnosis not present

## 2012-05-19 DIAGNOSIS — M6281 Muscle weakness (generalized): Secondary | ICD-10-CM | POA: Diagnosis not present

## 2012-05-19 DIAGNOSIS — B0222 Postherpetic trigeminal neuralgia: Secondary | ICD-10-CM | POA: Diagnosis not present

## 2012-05-19 DIAGNOSIS — R262 Difficulty in walking, not elsewhere classified: Secondary | ICD-10-CM | POA: Diagnosis not present

## 2012-05-19 DIAGNOSIS — R269 Unspecified abnormalities of gait and mobility: Secondary | ICD-10-CM | POA: Diagnosis not present

## 2012-05-19 DIAGNOSIS — F068 Other specified mental disorders due to known physiological condition: Secondary | ICD-10-CM | POA: Diagnosis not present

## 2012-05-21 DIAGNOSIS — R928 Other abnormal and inconclusive findings on diagnostic imaging of breast: Secondary | ICD-10-CM | POA: Diagnosis not present

## 2012-05-22 DIAGNOSIS — M6281 Muscle weakness (generalized): Secondary | ICD-10-CM | POA: Diagnosis not present

## 2012-05-22 DIAGNOSIS — R262 Difficulty in walking, not elsewhere classified: Secondary | ICD-10-CM | POA: Diagnosis not present

## 2012-06-02 DIAGNOSIS — R262 Difficulty in walking, not elsewhere classified: Secondary | ICD-10-CM | POA: Diagnosis not present

## 2012-06-02 DIAGNOSIS — M6281 Muscle weakness (generalized): Secondary | ICD-10-CM | POA: Diagnosis not present

## 2012-06-04 DIAGNOSIS — R262 Difficulty in walking, not elsewhere classified: Secondary | ICD-10-CM | POA: Diagnosis not present

## 2012-06-04 DIAGNOSIS — M6281 Muscle weakness (generalized): Secondary | ICD-10-CM | POA: Diagnosis not present

## 2012-06-08 DIAGNOSIS — E785 Hyperlipidemia, unspecified: Secondary | ICD-10-CM | POA: Diagnosis not present

## 2012-06-08 DIAGNOSIS — N183 Chronic kidney disease, stage 3 unspecified: Secondary | ICD-10-CM | POA: Diagnosis not present

## 2012-06-08 DIAGNOSIS — E039 Hypothyroidism, unspecified: Secondary | ICD-10-CM | POA: Diagnosis not present

## 2012-06-08 DIAGNOSIS — I129 Hypertensive chronic kidney disease with stage 1 through stage 4 chronic kidney disease, or unspecified chronic kidney disease: Secondary | ICD-10-CM | POA: Diagnosis not present

## 2012-06-08 DIAGNOSIS — D638 Anemia in other chronic diseases classified elsewhere: Secondary | ICD-10-CM | POA: Diagnosis not present

## 2012-06-10 DIAGNOSIS — I4891 Unspecified atrial fibrillation: Secondary | ICD-10-CM | POA: Diagnosis not present

## 2012-06-10 DIAGNOSIS — I2699 Other pulmonary embolism without acute cor pulmonale: Secondary | ICD-10-CM | POA: Diagnosis not present

## 2012-06-10 DIAGNOSIS — I6529 Occlusion and stenosis of unspecified carotid artery: Secondary | ICD-10-CM | POA: Diagnosis not present

## 2012-06-10 DIAGNOSIS — F411 Generalized anxiety disorder: Secondary | ICD-10-CM | POA: Diagnosis not present

## 2012-06-10 DIAGNOSIS — I10 Essential (primary) hypertension: Secondary | ICD-10-CM | POA: Diagnosis not present

## 2012-06-10 DIAGNOSIS — N183 Chronic kidney disease, stage 3 unspecified: Secondary | ICD-10-CM | POA: Diagnosis not present

## 2012-06-10 DIAGNOSIS — T81718A Complication of other artery following a procedure, not elsewhere classified, initial encounter: Secondary | ICD-10-CM | POA: Diagnosis not present

## 2012-06-10 DIAGNOSIS — Z7901 Long term (current) use of anticoagulants: Secondary | ICD-10-CM | POA: Diagnosis not present

## 2012-06-12 DIAGNOSIS — M6281 Muscle weakness (generalized): Secondary | ICD-10-CM | POA: Diagnosis not present

## 2012-06-12 DIAGNOSIS — R262 Difficulty in walking, not elsewhere classified: Secondary | ICD-10-CM | POA: Diagnosis not present

## 2012-06-16 DIAGNOSIS — R262 Difficulty in walking, not elsewhere classified: Secondary | ICD-10-CM | POA: Diagnosis not present

## 2012-06-16 DIAGNOSIS — M6281 Muscle weakness (generalized): Secondary | ICD-10-CM | POA: Diagnosis not present

## 2012-06-19 DIAGNOSIS — R262 Difficulty in walking, not elsewhere classified: Secondary | ICD-10-CM | POA: Diagnosis not present

## 2012-06-19 DIAGNOSIS — M6281 Muscle weakness (generalized): Secondary | ICD-10-CM | POA: Diagnosis not present

## 2012-06-23 DIAGNOSIS — R262 Difficulty in walking, not elsewhere classified: Secondary | ICD-10-CM | POA: Diagnosis not present

## 2012-06-23 DIAGNOSIS — M6281 Muscle weakness (generalized): Secondary | ICD-10-CM | POA: Diagnosis not present

## 2012-06-26 DIAGNOSIS — M6281 Muscle weakness (generalized): Secondary | ICD-10-CM | POA: Diagnosis not present

## 2012-06-26 DIAGNOSIS — R262 Difficulty in walking, not elsewhere classified: Secondary | ICD-10-CM | POA: Diagnosis not present

## 2012-06-30 DIAGNOSIS — R262 Difficulty in walking, not elsewhere classified: Secondary | ICD-10-CM | POA: Diagnosis not present

## 2012-06-30 DIAGNOSIS — M6281 Muscle weakness (generalized): Secondary | ICD-10-CM | POA: Diagnosis not present

## 2012-08-04 ENCOUNTER — Other Ambulatory Visit: Payer: Self-pay | Admitting: Family Medicine

## 2012-08-04 ENCOUNTER — Ambulatory Visit
Admission: RE | Admit: 2012-08-04 | Discharge: 2012-08-04 | Disposition: A | Discharge status: Home / Self Care | Payer: Medicare Other | Source: Ambulatory Visit | Attending: Family Medicine | Admitting: Family Medicine

## 2012-08-04 DIAGNOSIS — M546 Pain in thoracic spine: Secondary | ICD-10-CM | POA: Diagnosis not present

## 2012-08-04 DIAGNOSIS — M542 Cervicalgia: Secondary | ICD-10-CM | POA: Diagnosis not present

## 2012-08-04 DIAGNOSIS — M549 Dorsalgia, unspecified: Secondary | ICD-10-CM

## 2012-08-04 DIAGNOSIS — R5383 Other fatigue: Secondary | ICD-10-CM | POA: Diagnosis not present

## 2012-08-04 DIAGNOSIS — M47814 Spondylosis without myelopathy or radiculopathy, thoracic region: Secondary | ICD-10-CM | POA: Diagnosis not present

## 2012-08-04 DIAGNOSIS — R5381 Other malaise: Secondary | ICD-10-CM | POA: Diagnosis not present

## 2012-08-11 DIAGNOSIS — H4011X0 Primary open-angle glaucoma, stage unspecified: Secondary | ICD-10-CM | POA: Diagnosis not present

## 2012-08-11 DIAGNOSIS — H409 Unspecified glaucoma: Secondary | ICD-10-CM | POA: Diagnosis not present

## 2012-08-12 DIAGNOSIS — B351 Tinea unguium: Secondary | ICD-10-CM | POA: Diagnosis not present

## 2012-08-12 DIAGNOSIS — M79609 Pain in unspecified limb: Secondary | ICD-10-CM | POA: Diagnosis not present

## 2012-08-12 DIAGNOSIS — L84 Corns and callosities: Secondary | ICD-10-CM | POA: Diagnosis not present

## 2012-09-08 DIAGNOSIS — E785 Hyperlipidemia, unspecified: Secondary | ICD-10-CM | POA: Diagnosis not present

## 2012-09-08 DIAGNOSIS — R42 Dizziness and giddiness: Secondary | ICD-10-CM | POA: Diagnosis not present

## 2012-09-08 DIAGNOSIS — N183 Chronic kidney disease, stage 3 unspecified: Secondary | ICD-10-CM | POA: Diagnosis not present

## 2012-09-08 DIAGNOSIS — L989 Disorder of the skin and subcutaneous tissue, unspecified: Secondary | ICD-10-CM | POA: Diagnosis not present

## 2012-09-08 DIAGNOSIS — E559 Vitamin D deficiency, unspecified: Secondary | ICD-10-CM | POA: Diagnosis not present

## 2012-09-08 DIAGNOSIS — D696 Thrombocytopenia, unspecified: Secondary | ICD-10-CM | POA: Diagnosis not present

## 2012-09-08 DIAGNOSIS — I129 Hypertensive chronic kidney disease with stage 1 through stage 4 chronic kidney disease, or unspecified chronic kidney disease: Secondary | ICD-10-CM | POA: Diagnosis not present

## 2012-09-30 ENCOUNTER — Telehealth: Payer: Self-pay | Admitting: Internal Medicine

## 2012-09-30 NOTE — Telephone Encounter (Signed)
S/W PT IN RE TO NP APPT 07/30 @ 1:30 W/DR. MOHAMED REFERRING DR. Aram Beecham WHITE DX- CHRONIC ANEMIA WELCOME PACKET MAILED.   REFERRING OFFICE IS AWARE OF APPT

## 2012-10-02 ENCOUNTER — Telehealth: Payer: Self-pay | Admitting: Internal Medicine

## 2012-10-02 NOTE — Telephone Encounter (Signed)
C/D 10/02/12 for appt 10/21/12

## 2012-10-06 ENCOUNTER — Telehealth: Payer: Self-pay | Admitting: Neurology

## 2012-10-07 NOTE — Telephone Encounter (Signed)
Patient states her nerve pain has been get worse  since Friday It mainly right side. Patient state she has really bad episodes with nerve pain that last about 3 month. Patient came in to see Dr. Marylou Flesher for this in 2010  has been seeing Darrol Angel since then last seen the doctor 05-19-12. Dr. Vickey Huger prescribed carbatrol 200 MG 3 times a day. And at this time the patient states the medication is no longer working. Please advise best contact # [phone]

## 2012-10-12 ENCOUNTER — Telehealth: Payer: Self-pay | Admitting: Neurology

## 2012-10-13 ENCOUNTER — Telehealth: Payer: Self-pay | Admitting: Neurology

## 2012-10-13 NOTE — Telephone Encounter (Signed)
Spoke to patient. Scheduled next available appt on 10/20/12 with Dr. Vickey Huger. Patient was very grateful. She says she is not in pain all the time, but pain is most present when chewing, brushing teeth, and washing face.

## 2012-10-13 NOTE — Telephone Encounter (Signed)
Needs a Rv with me or carolyn ASAP.

## 2012-10-20 ENCOUNTER — Encounter: Payer: Self-pay | Admitting: Neurology

## 2012-10-20 ENCOUNTER — Ambulatory Visit (INDEPENDENT_AMBULATORY_CARE_PROVIDER_SITE_OTHER): Payer: Medicare Other | Admitting: Neurology

## 2012-10-20 VITALS — BP 149/61 | HR 55 | Ht 65.0 in | Wt 234.0 lb

## 2012-10-20 DIAGNOSIS — B0222 Postherpetic trigeminal neuralgia: Secondary | ICD-10-CM | POA: Diagnosis not present

## 2012-10-20 HISTORY — DX: Postherpetic trigeminal neuralgia: B02.22

## 2012-10-20 MED ORDER — CARBAMAZEPINE ER 100 MG PO CP12: 100.0000 mg | ORAL_CAPSULE | Freq: Three times a day (TID) | ORAL | Status: DC

## 2012-10-20 NOTE — Progress Notes (Signed)
[facility]  Provider:  Dr Vickey Huger Referring Provider: No ref. provider found Primary Care Physician:  Cala Bradford, MD  Chief Complaint  Patient presents with  . Follow-up    dementia, rm 11    HPI:  [name] is a 79 y.o. female here as a revisit from Dr. Cliffton Asters ,  a patient with atypical facial pain and changes in the quality of her facial pain.  The patient had DVTs and embolic 3 or 4  PEs in the past and is anticoagulated , has been chronic anemic, probably due to slow constant blood loss. She will see Dr. Shirline Frees next week.   Arms provided her primary care physician's last laboratory results for me today  Results are from 6-29 and include a slight elevation of creatinine at 1.16, not unusual for  the patient's age and muscle mass, normal BUN and glucose normal sodium and chloride. GFR was 45 white blood cell count of 6.1 RBC  3.56, hemoglobin 9.8 hematocrit 29.8 platelet count under 20,000 monocytes 15.3%, her liver function tests and her lipids were in normal range.   The patient was last seen by me in February 2014 , time she complained of breakthrough pain to the right  Face,  after initially doing well on carbamazepine Carbatrol brandname . The onset of her facial pain was related to herpes zoster of facial shingles and 2006 and therefore treated as a postherpetic neuralgia. The first treatment with Neurontin did not provide relief in 2006. Change from carbamazepine Brand to generic let to break through pain.  She continued to take her medication independently had been doing well as her activities of daily living except for problems with ambulation. CT of the had short non-acute chronic left cerebellar infarcts. She also has a history of a gait  disorder  and sleep apnea. Memory testing has been provided over the last 2 years intermittently and today again the patient scored 29/30 on the Folstein Mini-Mental test and animal fluency test scored 15 points this  would be well in normal limits.  The patient reports that the nerve pain to the right side of her face has now involved the temporal area,  And  initially was throbbing pain is now again sharp and electric shot- sensation like. She points above the right eyebrow and the pain originates from there,  but radiates towards the temple, she reports tear  flow with the right eye pain, in between the shocklike pain attacks she states her right face feels normal,  but during a painful spell even  light touch is perceived as a very painful stimulus. Allodynia. She cannot sleep on the right side. either.   Review of Systems: Out of a complete 14 system review, the patient complains of only the following symptoms, and all other reviewed systems are negative. Right facial sharp electric shock sensation, sharp stabbing - PAIN.    Palpitation, followed by Garnette Scheuermann.MD   History   Social History  . Marital Status: Divorced    Spouse Name: N/A    Number of Children: 3  . Years of Education: 14   Occupational History  . retired     Estate agent   Social History Main Topics  . Smoking status: Never Smoker   . Smokeless tobacco: Not on file  . Alcohol Use: Yes     Comment: very moderate, none  in the last two months  . Drug Use: No  . Sexually Active: Not on file  Other Topics Concern  . Not on file   Social History Narrative  . No narrative on file    Family History  Problem Relation Age of Onset  . Diabetes Mother   . Hypertension Mother   . Hypertension Father   . Heart attack Brother     2 half brothers  . Glaucoma Other   . Glaucoma Other     Past Medical History  Diagnosis Date  . Shingles     zoster 1990 on the back and on the abdomen in 2007,facial zoster 2006  . Heart disease   . Hypertension   . Hypercholesterolemia   . A-fib   . Diabetes mellitus without complication   . Glaucoma   . Pulmonary embolism     3  . Acid reflux   . Arthritis   . Anxiety    . Migraine   . Facial pain     Past Surgical History  Procedure Laterality Date  . Blood clots  06/2006    lower abdomen  . Pulmonary embolism surgery  03/2005  . Phlebitis  12/2005  . Cholecystectomy  01/1983  . Biopsies of breast Bilateral 1971  . Migraine speech impairment  07/1963  . Hemorrhoid surgery  1966  . Appendectomy  1945  . Tonsillectomy      1954  . Gallbladder surgery  1980  . Insertion of vena cova filter  2007    Current Outpatient Prescriptions  Medication Sig Dispense Refill  . Calcium Citrate-Vitamin D (CALCIUM CITRATE +D PO) Take by mouth daily.      . carbamazepine (CARBATROL) 100 MG 12 hr capsule Take 100 mg by mouth. 2 in the mornings , 2 at midday, and 2 at night, total of 6 divided three times daily      . CLINDAMYCIN HCL PO Take by mouth. 1 hour prior  And 6 hours after dental appts.      . enoxaparin (LOVENOX) 100 MG/ML injection Inject 100 mg into the skin daily.      Marland Kitchen EPINEPHrine (EPIPEN IJ) Inject as directed. If needed      . Fe Fum-FA-B Cmp-C-Zn-Mg-Mn-Cu (HEMOCYTE PLUS) 106-1 MG CAPS Take by mouth daily.      . folic acid (FOLVITE) 1 MG tablet Take 1 mg by mouth daily.      . hydrocodone-acetaminophen (LORCET-HD) 5-500 MG per capsule Take 1 capsule by mouth. Up to three times daily as needed      . levothyroxine (SYNTHROID, LEVOTHROID) 75 MCG tablet Take 75 mcg by mouth daily.      . Loperamide HCl (IMODIUM A-D PO) Take by mouth 2 (two) times daily.      . Loratadine (CLARITIN PO) Take by mouth daily.      Marland Kitchen LORazepam (ATIVAN PO) Take by mouth. As needed      . MECLIZINE HCL PO Take by mouth. As needed      . Menthol (EUCERIN SKIN CALMING) 0.1 % LOTN Apply topically 2 (two) times daily.      . Multiple Vitamin (MULTIVITAMIN) tablet Take 1 tablet by mouth daily.      . pravastatin (PRAVACHOL) 40 MG tablet Take 40 mg by mouth daily.      . RABEprazole (ACIPHEX) 20 MG tablet Take 20 mg by mouth 2 (two) times daily.      . sotalol (BETAPACE) 120 MG  tablet Take 120 mg by mouth 2 (two) times daily.      . Travoprost (TRAVATAN Z OP) Both eyes  daily      . valsartan (DIOVAN) 320 MG tablet Take 320 mg by mouth daily.      . Vitamin D, Ergocalciferol, (DRISDOL) 50000 UNITS CAPS Take 50,000 Units by mouth daily.       No current facility-administered medications for this visit.    Allergies as of 10/20/2012 - Review Complete 10/20/2012  Allergen Reaction Noted  . Ambien (zolpidem tartrate)  10/20/2012  . Benadryl (diphenhydramine hcl)  10/20/2012  . Betadine antibiotic-moisturize (bacitracin-polymyxin b)  10/20/2012  . Biaxin (clarithromycin)  10/20/2012  . Celebrex (celecoxib)  10/20/2012  . Ciprofloxacin hcl  10/20/2012  . Combigan (brimonidine tartrate-timolol)  10/20/2012  . Coumadin (warfarin sodium)  10/20/2012  . Coumarin  10/20/2012  . Eye drops (tetrahydrazoline hcl)  10/20/2012  . Flu virus vaccine  10/20/2012  . Ivp dye (iodinated diagnostic agents)  10/20/2012  . Levaquin (levofloxacin in d5w)  10/20/2012  . Naprosyn (naproxen)  10/20/2012  . Pataday (olopatadine hcl)  10/20/2012  . Penicillins  10/20/2012  . Plavix (clopidogrel bisulfate)  10/20/2012  . Septra (sulfamethoxazole-tmp ds)  10/20/2012  . Shrimp (shellfish allergy)  10/20/2012  . Voltaren (diclofenac sodium)  10/20/2012  . Iohexol  07/10/2006    Vitals: BP 149/61  Pulse 55  Ht 5\' 5"  (1.651 m)  Wt 234 lb (106.142 kg)  BMI 38.94 kg/m2 Last Weight:  Wt Readings from Last 1 Encounters:  10/20/12 234 lb (106.142 kg)   Last Height:   Ht Readings from Last 1 Encounters:  10/20/12 5\' 5"  (1.651 m)    Physical exam:  General: The patient is awake, alert and appears not in acute distress. The patient is well groomed. Head: Normocephalic, atraumatic. Neck is supple. Cardiovascular:  irregular rate and rhythm, without  murmurs or carotid bruit, and without distended neck veins. Respiratory: Lungs are clear to auscultation. Skin:   evidence of ankle   edema, or rash. Trunk: BMI is elevated and patient  has normal posture.  Neurologic exam : The patient is awake and alert, oriented to place and time.  Memory subjective  described as intact. There is a normal attention span & concentration ability. Speech is fluent withoutdysarthria, dysphonia or aphasia. Mood and affect are appropriate.  Cranial nerves: Pupils are equal and briskly reactive to light. Funduscopic exam without evidence of pallor or edema. Extraocular movements  in vertical and horizontal planes intact and without nystagmus. Visual fields by finger perimetry are intact. Hearing to finger rub intact.  Facial sensation  Allodynia in right face at the region of the temporal l artery,  Pain with chewing. Facial motor strength is symmetric and tongue and uvula move midline.  Motor exam:   Normal tone and normal muscle bulk and symmetric normal strength in all extremities.  Deep tendon reflexes of the upper extremities are 1+ she has a mild patellar response nor Achilles tendon reflex is noted.  Assessment; this patient has a nine-year history of postherpetic neuralgia of the right face involving originally upward using carbamazepine she has found relief but recent breakthrough pain has a more stabbing and acute character as if an electric shock or sharp object hit her at the corner of the right eye right above the eyebrow. She also has slightly indurated temple arteries and reports trouble with chewing as this triggers pain. She does not report jaw claudications . Her ophthalmologist, Dr. Delford Field well is following her for her glaucoma. The patient reports that her right I can't hear a will tear with an acute  pain attack. The pain never wakes her up out of sleep.  She has some generalized muscle pain and aches.  We will obtain a sedimentation rate and a C-reactive protein today to rule out giant cell arteritis. At the same time I will increase the carbamazepine to see if the can regain  control over her facial pain. She does not provide a history for cluster headaches but rather for SUNCT, or neuralgic pain.  Carbamazepine will be increased first to the nighttime dose for one week then the morning doses will follow . She will need every 6 months electrolytes testing to evaluate her for hyponatremia.  I would appreciate feedback from the patient 's cardiologist, Dr. Garnette Scheuermann, as to  using a calcium channel blocker for her to possibly help with some of her facial pain. This however is planned in case an increased dose of Tegretol is not getting the desired result.

## 2012-10-20 NOTE — Patient Instructions (Signed)
Trigeminal Neuralgia Trigeminal neuralgia is a nerve disorder that causes sudden attacks of severe facial pain. It is caused by damage to the trigeminal nerve, a major nerve in the face. It is more common in women and in the elderly, although it can also happen in younger patients. Attacks last from a few seconds to several minutes and can occur from a couple of times per year to several times per day. Trigeminal neuralgia can be a very distressing and disabling condition. Surgery may be needed in very severe cases if medical treatment does not give relief. HOME CARE INSTRUCTIONS   If your caregiver prescribed medication to help prevent attacks, take as directed.  To help prevent attacks:  Chew on the unaffected side of the mouth.  Avoid touching your face.  Avoid blasts of hot or cold air.  Men may wish to grow a beard to avoid having to shave. SEEK IMMEDIATE MEDICAL CARE IF:  Pain is unbearable and your medicine does not help.  You develop new, unexplained symptoms (problems).  You have problems that may be related to a medication you are taking. Document Released: 03/08/2000 Document Revised: 06/03/2011 Document Reviewed: 01/06/2009 [facility] Patient Information 2014 [place], [place]. Postherpetic Neuralgia Shingles is a painful disease. It is caused by the herpes zoster virus. This is the same virus which also causes chickenpox. It can affect the torso, limbs, or the face. For most people, shingles is a condition of rather sudden onset. Pain usually lasts about 1 month. In older patients, or patients with poor immune systems, a painful, long-standing (chronic) condition called postherpetic neuralgia can develop. This condition rarely happens before age 71. But at least 50% of people over 50 become affected following an attack of shingles. There is a natural tendency for this condition to improve over time with no treatment. Less than 5% of patients have pain that lasts for more than 1  year. DIAGNOSIS  Herpes is usually easily diagnosed on physical exam. Pain sometimes follows when the skin sores (lesions) have disappeared. It is called postherpetic neuralgia. That name simply means the pain that follows herpes. TREATMENT   Treating this condition may be difficult. Usually one of the tricyclic antidepressants, often amitriptyline, is the first line of treatment. There is evidence that the sooner these medications are given, the more likely they are to reduce pain.  Conventional analgesics, regional nerve blocks, and anticonvulsants have little benefit in most cases when used alone. Other tricyclic anti-depressants are used as a second option if the first antidepressant is unsuccessful.  Anticonvulsants, including carbamazepine, have been found to provide some added benefit when used with a tricyclic anti-depressant. This is especially for the stabbing type of pain similar to that of trigeminal neuralgia.  Chronic opioid therapy. This is a strong narcotic pain medication. It is used to treat pain that is resistant to other measures. The issues of dependency and tolerance can be reduced with closely managed care.  Some cream treatments are applied locally to the affected area. They can help when used with other treatments. Their use may be difficult in the case of postherpetic trigeminal neuralgia. This is involved with the face. So the substances can irritate the eye and the skin around the eye. Examples of creams used include Capsaicin and lidocaine creams.  For shingles, antiviral therapies along with analgesics are recommended. Studies of the effect of anti-viral agents such as acyclovir on shingles have been done. They show improved rates of healing and decreased severity of sudden (acute) pain.  Some observations suggest that nerve blocks during shingles infection will:  Reduce pain.  Shorten the acute episode.  Prevent the emergence of postherpetic neuralgia. Viral  medications used include Acyclovir (Zovirax), Valacyclovir, Famciclovir and a lysine diet. Document Released: 06/01/2002 Document Revised: 06/03/2011 Document Reviewed: 03/11/2005 [facility] Patient Information 2014 [place], [place].

## 2012-10-21 ENCOUNTER — Ambulatory Visit (HOSPITAL_BASED_OUTPATIENT_CLINIC_OR_DEPARTMENT_OTHER): Payer: Medicare Other | Admitting: Lab

## 2012-10-21 ENCOUNTER — Encounter: Payer: Self-pay | Admitting: Internal Medicine

## 2012-10-21 ENCOUNTER — Other Ambulatory Visit: Payer: Medicare Other | Admitting: Lab

## 2012-10-21 ENCOUNTER — Ambulatory Visit (HOSPITAL_BASED_OUTPATIENT_CLINIC_OR_DEPARTMENT_OTHER): Payer: Medicare Other | Admitting: Internal Medicine

## 2012-10-21 ENCOUNTER — Telehealth: Payer: Self-pay | Admitting: *Deleted

## 2012-10-21 ENCOUNTER — Ambulatory Visit: Payer: Medicare Other

## 2012-10-21 VITALS — BP 183/66 | HR 58 | Temp 97.5°F | Resp 20 | Ht 65.0 in | Wt 233.8 lb

## 2012-10-21 DIAGNOSIS — D539 Nutritional anemia, unspecified: Secondary | ICD-10-CM

## 2012-10-21 DIAGNOSIS — B351 Tinea unguium: Secondary | ICD-10-CM | POA: Diagnosis not present

## 2012-10-21 DIAGNOSIS — Z7901 Long term (current) use of anticoagulants: Secondary | ICD-10-CM

## 2012-10-21 DIAGNOSIS — Q828 Other specified congenital malformations of skin: Secondary | ICD-10-CM | POA: Diagnosis not present

## 2012-10-21 DIAGNOSIS — M79609 Pain in unspecified limb: Secondary | ICD-10-CM | POA: Diagnosis not present

## 2012-10-21 LAB — COMPREHENSIVE METABOLIC PANEL (CC13)
ALT: 7 U/L (ref 0–55)
AST: 12 U/L (ref 5–34)
Albumin: 3.8 g/dL (ref 3.5–5.0)
BUN: 20.7 mg/dL (ref 7.0–26.0)
CO2: 22 mEq/L (ref 22–29)
Calcium: 8.9 mg/dL (ref 8.4–10.4)
Chloride: 107 mEq/L (ref 98–109)
Creatinine: 1.2 mg/dL — ABNORMAL HIGH (ref 0.6–1.1)
Potassium: 5.4 mEq/L — ABNORMAL HIGH (ref 3.5–5.1)

## 2012-10-21 LAB — CBC & DIFF AND RETIC
BASO%: 0.9 % (ref 0.0–2.0)
EOS%: 3.1 % (ref 0.0–7.0)
Immature Retic Fract: 4.2 % (ref 1.60–10.00)
MCH: 27.6 pg (ref 25.1–34.0)
MCHC: 33.1 g/dL (ref 31.5–36.0)
MONO#: 0.6 10*3/uL (ref 0.1–0.9)
RDW: 17 % — ABNORMAL HIGH (ref 11.2–14.5)
Retic %: 1.09 % (ref 0.70–2.10)
Retic Ct Abs: 39.46 10*3/uL (ref 33.70–90.70)
WBC: 5.8 10*3/uL (ref 3.9–10.3)
lymph#: 1.6 10*3/uL (ref 0.9–3.3)

## 2012-10-21 LAB — LACTATE DEHYDROGENASE (CC13): LDH: 164 U/L (ref 125–245)

## 2012-10-21 NOTE — Progress Notes (Signed)
Checked in new patient with no financial issues. Mail and phone only. No email. Didn't ask if living will or POA.

## 2012-10-21 NOTE — Progress Notes (Signed)
Quick Note:  Call patient with the result and repeat B met on Friday ______

## 2012-10-21 NOTE — Progress Notes (Signed)
[facility] [phone]   [phone]  CONSULT NOTE  REFERRING PHYSICIAN: Dr. Laurann Montana.  REASON FOR CONSULTATION:  79 years old white female with persistent anemia.   HPI [name] is a 79 y.o. female was past medical history significant for multiple medical problems including hypertension, dyslipidemia, coronary artery disease, atrial fibrillation, pulmonary embolism, GERD, hypothyroidism as well as history of stroke and shingles. The patient has been on Lovenox for the last 6 years for history of recurrent pulmonary embolism with recurrent deep venous thrombosis, status post inferior vena cava filter placement in February of 2007. Constipation 6 years ago for evaluation of her recurrent pulmonary emboli and recommendation was to keep her on Lovenox therapeutic dose. She is tolerating her treatment fairly well. She was seen by her primary care physician Dr. Cliffton Asters recently and was found to have persistent anemia. Her hemoglobin has been in the range of 9.0 -11.0 g/dL for the last few years. She had iron study performed in March of 2014 and it showed serum iron of 100 with iron saturation of 37%. Transferrin level was slightly low at 193. She was started on treatment with oral iron supplement in the form of Hemocyte for the last 2 years but no significant improvement in her anemia. The patient was referred to me today for further evaluation and recommendation regarding her persistent anemia. She had stool for Hemoccult to that was negative months ago. She also had a virtual colonoscopy 3 years ago that was unremarkable. She denied having any other significant bleeding except for occasional gum bleed. The patient denied having any epistaxis, hematemesis, hemoptysis, melena or hematochezia. Her only complaint is fatigue as well as very rare dizzy spells. She denied having any significant weight loss or night sweats. The patient continues to have  shortness breath with exertion. Family history significant for her mother and father with congestive heart failure and 2 brother with heart disease. The patient is divorced and has 3 children. 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  She has no history of smoking and drinks alcohol occasionally and no history of drug abuse. She was accompanied today by her daughter [name]. @SFHPI @  Past Medical History  Diagnosis Date  . Shingles     zoster 1990 on the back and on the abdomen in 2007,facial zoster 2006  . Heart disease   . Hypertension   . Hypercholesterolemia   . A-fib   . Diabetes mellitus without complication   . Glaucoma   . Pulmonary embolism     3  . Acid reflux   . Arthritis   . Anxiety   . Migraine   . Facial pain   . Postherpetic trigeminal neuralgia 10/20/2012    Past Surgical History  Procedure Laterality Date  . Blood clots  06/2006    lower abdomen  . Pulmonary embolism surgery  03/2005  . Phlebitis  12/2005  . Cholecystectomy  01/1983  . Biopsies of breast Bilateral 1971  . Migraine speech impairment  07/1963  . Hemorrhoid surgery  1966  . Appendectomy  1945  . Tonsillectomy      1954  . Gallbladder surgery  1980  . Insertion of vena cova filter  2007    Family History  Problem Relation Age of Onset  . Diabetes Mother   . Hypertension Mother   . Hypertension Father   . Heart attack Brother     2 half brothers  . Glaucoma Other   .  Glaucoma Other     Social History History  Substance Use Topics  . Smoking status: Never Smoker   . Smokeless tobacco: Not on file  . Alcohol Use: Yes     Comment: very moderate, none  in the last two months    Allergies  Allergen Reactions  . Ambien (Zolpidem Tartrate)     A high, hives  . Benadryl (Diphenhydramine Hcl)     hives  . Betadine Antibiotic-Moisturize (Bacitracin-Polymyxin B)   . Biaxin (Clarithromycin)     hives  . Celebrex (Celecoxib)   . Ciprofloxacin Hcl     rash  . Combigan  (Brimonidine Tartrate-Timolol)   . Coumadin (Warfarin Sodium)     rash  . Coumarin   . Eye Drops (Tetrahydrazoline Hcl)     Burning, itching, swelling  . Flu Virus Vaccine     Short of breath  . Ivp Dye (Iodinated Diagnostic Agents)     hives  . Levaquin (Levofloxacin In D5w)     rash  . Naprosyn (Naproxen)     rash  . Pataday (Olopatadine Hcl)   . Penicillins     hives  . Plavix (Clopidogrel Bisulfate)     rash  . Septra (Sulfamethoxazole-Tmp Ds)     hives  . Shrimp (Shellfish Allergy)     anaphylaxis  . Voltaren (Diclofenac Sodium)     reflux  . Iohexol      Code: HIVES, Desc: PT IS ALLERGIC TO IVP DYE/IODINE/SHRIMP.  CAUSES SOB AND HIVES PER PT.  STEPHANIE, RT-R, Onset Date: [id number]     Current Outpatient Prescriptions  Medication Sig Dispense Refill  . Calcium Citrate-Vitamin D (CALCIUM CITRATE +D PO) Take by mouth daily.      . carbamazepine (CARBATROL) 100 MG 12 hr capsule Take 1 capsule (100 mg total) by mouth 3 (three) times daily after meals. 2 in the mornings , 2 at midday, and 3 at night, total of 6 divided three times daily- increased night dose to 3 at night for  One week, than increase AM dose as well. If tolerated .  230 capsule  5  . CLINDAMYCIN HCL PO Take by mouth. 1 hour prior  And 6 hours after dental appts.      . enoxaparin (LOVENOX) 100 MG/ML injection Inject 100 mg into the skin daily.      Marland Kitchen EPINEPHrine (EPIPEN IJ) Inject as directed. If needed      . Fe Fum-FA-B Cmp-C-Zn-Mg-Mn-Cu (HEMOCYTE PLUS) 106-1 MG CAPS Take by mouth daily.      . folic acid (FOLVITE) 1 MG tablet Take 1 mg by mouth daily.      . hydrocodone-acetaminophen (LORCET-HD) 5-500 MG per capsule Take 1 capsule by mouth. Up to three times daily as needed      . levothyroxine (SYNTHROID, LEVOTHROID) 75 MCG tablet Take 75 mcg by mouth daily.      . Loperamide HCl (IMODIUM A-D PO) Take by mouth 2 (two) times daily.      . Loratadine (CLARITIN PO) Take by mouth daily.      Marland Kitchen LORazepam  (ATIVAN PO) Take by mouth. As needed      . MECLIZINE HCL PO Take by mouth. As needed      . Menthol (EUCERIN SKIN CALMING) 0.1 % LOTN Apply topically 2 (two) times daily.      . Multiple Vitamin (MULTIVITAMIN) tablet Take 1 tablet by mouth daily.      . pravastatin (PRAVACHOL) 40 MG tablet Take 40 mg  by mouth daily.      . RABEprazole (ACIPHEX) 20 MG tablet Take 20 mg by mouth 2 (two) times daily.      . sotalol (BETAPACE) 120 MG tablet Take 120 mg by mouth 2 (two) times daily.      . Travoprost (TRAVATAN Z OP) Both eyes daily      . valsartan (DIOVAN) 320 MG tablet Take 320 mg by mouth daily.      . Vitamin D, Ergocalciferol, (DRISDOL) 50000 UNITS CAPS Take 50,000 Units by mouth daily.       No current facility-administered medications for this visit.    Review of Systems  A comprehensive review of systems was negative except for: Constitutional: positive for fatigue Neurological: positive for dizziness  Physical Exam  ZOX:WRUEA, healthy, no distress, well nourished and well developed SKIN: skin color, texture, turgor are normal HEAD: Normocephalic, No masses, lesions, tenderness or abnormalities EYES: normal, PERRLA EARS: External ears normal OROPHARYNX:no exudate and no erythema  NECK: supple, no adenopathy LYMPH:  no palpable lymphadenopathy, no hepatosplenomegaly BREAST:not examined LUNGS: clear to auscultation  HEART: regular rate & rhythm and no murmurs ABDOMEN:abdomen soft, non-tender, obese, normal bowel sounds and no masses or organomegaly BACK: Back symmetric, no curvature. EXTREMITIES:no joint deformities, effusion, or inflammation, no edema, no skin discoloration  NEURO: alert & oriented x 3 with fluent speech, no focal motor/sensory deficits  PERFORMANCE STATUS: ECOG 1-2  LABORATORY DATA: Lab Results  Component Value Date   WBC 5.1 05/26/2009   HGB 10.0* 05/26/2009   HCT 29.3* 05/26/2009   MCV 88.6 05/26/2009   PLT 128* 05/26/2009      Chemistry      Component  Value Date/Time   NA 137 05/27/2009 0520   K 3.9 05/27/2009 0520   CL 105 05/27/2009 0520   CO2 24 05/27/2009 0520   BUN 19 05/27/2009 0520   CREATININE 1.22* 05/27/2009 0520      Component Value Date/Time   CALCIUM 8.7 05/27/2009 0520   ALKPHOS 91 05/26/2009 1120   AST 16 05/26/2009 1120   ALT 10 05/26/2009 1120   BILITOT 0.5 05/26/2009 1120       RADIOGRAPHIC STUDIES: No results found.  ASSESSMENT: This is a very pleasant 79 years old white female with persistent anemia of unknown etiology questionable for anemia of chronic disease plus minus nutritional deficiency.  PLAN: I have a lengthy discussion with the patient and her daughter today about her current disease status. I ordered several studies for evaluation of her anemia including repeat CBC, comprehensive metabolic panel, iron study, ferritin, serum protein electrophoreses, serum erythropoietin as well as transferrin receptor. I recommended for the patient to continue her current treatment with oral iron tablets. The patient would come back for followup visit in 2 weeks for evaluation and discussion of her pending lab results and further recommendation regarding her anemia. She was advised to call immediately if she has any concerning symptoms in the interval. If there is no clear etiology for her anemia, I may consider the patient for bone marrow biopsy and aspirate to rule out myelodysplastic syndrome.  The patient voices understanding of current disease status and treatment options and is in agreement with the current care plan.  All questions were answered. The patient knows to call the clinic with any problems, questions or concerns. We can certainly see the patient much sooner if necessary.  Thank you so much for allowing me to participate in the care of [name]. I will  continue to follow up the patient with you and assist in her care.  I spent 35 minutes counseling the patient face to face. The total time spent in the appointment  was 50 minutes.  Jaaron Oleson K. 10/21/2012, 2:50 PM

## 2012-10-21 NOTE — Patient Instructions (Addendum)
I ordered several studies today to evaluate your anemia.  Followup visit in 2 weeks for evaluation and discussion of his lab results and recommendation regarding treatment of your condition.

## 2012-10-21 NOTE — Telephone Encounter (Signed)
appts made and printed. Pt had a busy schedule the week of Aug. 11th. gv appts for 11/10/12...td

## 2012-10-21 NOTE — Telephone Encounter (Signed)
Patient had an office visit on 10-20-12 to address issues.

## 2012-10-21 NOTE — Telephone Encounter (Signed)
Patient had an appointment on 10-20-12 to discuss nerve pain.

## 2012-10-22 ENCOUNTER — Telehealth: Payer: Self-pay | Admitting: Medical Oncology

## 2012-10-22 DIAGNOSIS — E875 Hyperkalemia: Secondary | ICD-10-CM

## 2012-10-22 NOTE — Telephone Encounter (Signed)
Message copied by Charma Igo on Thu Oct 22, 2012 11:47 AM ------      Message from: Si Gaul      Created: Wed Oct 21, 2012 11:09 PM       Call patient with the result and repeat B met on Friday ------

## 2012-10-22 NOTE — Telephone Encounter (Signed)
Left message for pt to schedule lab appt for repeat BMET.ORder in and ONC tx requests sent

## 2012-10-23 ENCOUNTER — Other Ambulatory Visit (HOSPITAL_BASED_OUTPATIENT_CLINIC_OR_DEPARTMENT_OTHER): Payer: Medicare Other | Admitting: Lab

## 2012-10-23 DIAGNOSIS — E875 Hyperkalemia: Secondary | ICD-10-CM | POA: Diagnosis not present

## 2012-10-23 DIAGNOSIS — D539 Nutritional anemia, unspecified: Secondary | ICD-10-CM | POA: Diagnosis not present

## 2012-10-23 LAB — CBC WITH DIFFERENTIAL/PLATELET
BASO%: 1.3 % (ref 0.0–2.0)
Eosinophils Absolute: 0.2 10*3/uL (ref 0.0–0.5)
HCT: 28.5 % — ABNORMAL LOW (ref 34.8–46.6)
HGB: 9.6 g/dL — ABNORMAL LOW (ref 11.6–15.9)
LYMPH%: 22.1 % (ref 14.0–49.7)
MCHC: 33.7 g/dL (ref 31.5–36.0)
MONO#: 0.8 10*3/uL (ref 0.1–0.9)
NEUT#: 3 10*3/uL (ref 1.5–6.5)
NEUT%: 57.8 % (ref 38.4–76.8)
Platelets: 133 10*3/uL — ABNORMAL LOW (ref 145–400)
WBC: 5.3 10*3/uL (ref 3.9–10.3)
lymph#: 1.2 10*3/uL (ref 0.9–3.3)

## 2012-10-23 LAB — BASIC METABOLIC PANEL (CC13)
CO2: 22 mEq/L (ref 22–29)
Calcium: 8.8 mg/dL (ref 8.4–10.4)
Chloride: 103 mEq/L (ref 98–109)
Creatinine: 1.1 mg/dL (ref 0.6–1.1)
Glucose: 90 mg/dl (ref 70–140)

## 2012-10-26 LAB — PROTEIN ELECTROPHORESIS, SERUM
Albumin ELP: 62.3 % (ref 55.8–66.1)
Alpha-1-Globulin: 4.8 % (ref 2.9–4.9)
Alpha-2-Globulin: 10.9 % (ref 7.1–11.8)
Beta 2: 5.6 % (ref 3.2–6.5)
Beta Globulin: 6.2 % (ref 4.7–7.2)
Total Protein, Serum Electrophoresis: 6.8 g/dL (ref 6.0–8.3)

## 2012-10-26 LAB — ERYTHROPOIETIN: Erythropoietin: 12.4 m[IU]/mL (ref 2.6–18.5)

## 2012-10-30 ENCOUNTER — Emergency Department (HOSPITAL_COMMUNITY): Payer: Medicare Other

## 2012-10-30 ENCOUNTER — Encounter (HOSPITAL_COMMUNITY): Payer: Self-pay | Admitting: Emergency Medicine

## 2012-10-30 ENCOUNTER — Emergency Department (HOSPITAL_COMMUNITY)
Admission: EM | Admit: 2012-10-30 | Discharge: 2012-10-30 | Disposition: A | Discharge status: Home / Self Care | Payer: Medicare Other | Attending: Emergency Medicine | Admitting: Emergency Medicine

## 2012-10-30 DIAGNOSIS — Z8619 Personal history of other infectious and parasitic diseases: Secondary | ICD-10-CM | POA: Insufficient documentation

## 2012-10-30 DIAGNOSIS — E119 Type 2 diabetes mellitus without complications: Secondary | ICD-10-CM | POA: Insufficient documentation

## 2012-10-30 DIAGNOSIS — I4891 Unspecified atrial fibrillation: Secondary | ICD-10-CM | POA: Diagnosis not present

## 2012-10-30 DIAGNOSIS — H409 Unspecified glaucoma: Secondary | ICD-10-CM | POA: Insufficient documentation

## 2012-10-30 DIAGNOSIS — M129 Arthropathy, unspecified: Secondary | ICD-10-CM | POA: Diagnosis not present

## 2012-10-30 DIAGNOSIS — Z79899 Other long term (current) drug therapy: Secondary | ICD-10-CM | POA: Insufficient documentation

## 2012-10-30 DIAGNOSIS — Z8659 Personal history of other mental and behavioral disorders: Secondary | ICD-10-CM | POA: Diagnosis not present

## 2012-10-30 DIAGNOSIS — E7800 Pure hypercholesterolemia, unspecified: Secondary | ICD-10-CM | POA: Insufficient documentation

## 2012-10-30 DIAGNOSIS — I10 Essential (primary) hypertension: Secondary | ICD-10-CM | POA: Diagnosis not present

## 2012-10-30 DIAGNOSIS — Z880 Allergy status to penicillin: Secondary | ICD-10-CM | POA: Insufficient documentation

## 2012-10-30 DIAGNOSIS — Z8679 Personal history of other diseases of the circulatory system: Secondary | ICD-10-CM | POA: Diagnosis not present

## 2012-10-30 DIAGNOSIS — G500 Trigeminal neuralgia: Secondary | ICD-10-CM | POA: Diagnosis not present

## 2012-10-30 DIAGNOSIS — Z86711 Personal history of pulmonary embolism: Secondary | ICD-10-CM | POA: Diagnosis not present

## 2012-10-30 DIAGNOSIS — R51 Headache: Secondary | ICD-10-CM | POA: Diagnosis not present

## 2012-10-30 DIAGNOSIS — R6889 Other general symptoms and signs: Secondary | ICD-10-CM | POA: Diagnosis not present

## 2012-10-30 DIAGNOSIS — G40909 Epilepsy, unspecified, not intractable, without status epilepticus: Secondary | ICD-10-CM | POA: Insufficient documentation

## 2012-10-30 DIAGNOSIS — K219 Gastro-esophageal reflux disease without esophagitis: Secondary | ICD-10-CM | POA: Insufficient documentation

## 2012-10-30 DIAGNOSIS — Z9889 Other specified postprocedural states: Secondary | ICD-10-CM | POA: Insufficient documentation

## 2012-10-30 MED ORDER — ACETAMINOPHEN 325 MG PO TABS
650.0000 mg | ORAL_TABLET | Freq: Once | ORAL | Status: AC
  Administered 2012-10-30: 650 mg via ORAL
  Filled 2012-10-30: qty 2

## 2012-10-30 MED ORDER — PREDNISONE 20 MG PO TABS
40.0000 mg | ORAL_TABLET | Freq: Once | ORAL | Status: AC
  Administered 2012-10-30: 40 mg via ORAL
  Filled 2012-10-30: qty 2

## 2012-10-30 MED ORDER — PREDNISONE 20 MG PO TABS: 40.0000 mg | ORAL_TABLET | Freq: Every day | ORAL | Status: DC

## 2012-10-30 NOTE — ED Provider Notes (Signed)
CSN: [id number]     Arrival date & time 10/30/12  1837 History     First MD Initiated Contact with Patient 10/30/12 1955     Chief Complaint  Patient presents with  . Facial Pain    right sided.   (Consider location/radiation/quality/duration/timing/severity/associated sxs/prior Treatment) HPI Comments: [name] is a pleasant, 79 year old female, with a history of trigeminal neuralgia 3 years ago, treated with Carbatrol with resolution after a period of time, has had an exacerbation of the same pain over the past several weeks.  She has been seen by Dr. Aliene Beams, who restarted her Carbatrol at 200 mg 3 times a day she was seen again.  Several days ago for more pain control.  She was instructed to increase slowly 100 mg at night to a total of 300 mg at night to wander during the, day.  X2.  She has recently increased her morning dose to 3 mg without control of her pain despite taking Vicodin on top of this, and presents to the emergency room tonight for pain control, as it is now Friday night, and she is, afraid that she will not be able to make it through the weekend.  She reports, that any movement of her face.  Eating, chewing, drinking, smiling, laughing speaking, causes an increase in pain.  It is very sensitive to temperature change or touch.  She reports a dull headache on the left side of her head, which is unusual for her.  She denies any visual changes, any weakness in extremities, ataxia, altered thought process.  The history is provided by the patient.    Past Medical History  Diagnosis Date  . Shingles     zoster 1990 on the back and on the abdomen in 2007,facial zoster 2006  . Heart disease   . Hypertension   . Hypercholesterolemia   . A-fib   . Diabetes mellitus without complication   . Glaucoma   . Pulmonary embolism     3  . Acid reflux   . Arthritis   . Anxiety   . Migraine   . Facial pain   . Postherpetic trigeminal neuralgia 10/20/2012   Past Surgical History   Procedure Laterality Date  . Blood clots  06/2006    lower abdomen  . Pulmonary embolism surgery  03/2005  . Phlebitis  12/2005  . Cholecystectomy  01/1983  . Biopsies of breast Bilateral 1971  . Migraine speech impairment  07/1963  . Hemorrhoid surgery  1966  . Appendectomy  1945  . Tonsillectomy      1954  . Gallbladder surgery  1980  . Insertion of vena cova filter  2007   Family History  Problem Relation Age of Onset  . Diabetes Mother   . Hypertension Mother   . Hypertension Father   . Heart attack Brother     2 half brothers  . Glaucoma Other   . Glaucoma Other    History  Substance Use Topics  . Smoking status: Never Smoker   . Smokeless tobacco: Not on file  . Alcohol Use: Yes     Comment: very moderate, none  in the last two months   OB History   Grav Para Term Preterm Abortions TAB SAB Ect Mult Living                 Review of Systems  Constitutional: Negative for fever and chills.  HENT: Negative for hearing loss, ear pain, congestion, sore throat, rhinorrhea, trouble swallowing  and neck pain.   Eyes: Negative for visual disturbance.  Gastrointestinal: Negative for nausea and vomiting.  Skin: Negative for rash and wound.  Neurological: Positive for headaches. Negative for dizziness, speech difficulty, weakness and numbness.  All other systems reviewed and are negative.    Allergies  Ambien; Benadryl; Betadine antibiotic-moisturize; Biaxin; Celebrex; Ciprofloxacin hcl; Combigan; Coumadin; Coumarin; Eye drops; Flu virus vaccine; Ivp dye; Levaquin; Naprosyn; Pataday; Penicillins; Plavix; Septra; Shrimp; Voltaren; and Iohexol  Home Medications   Current Outpatient Rx  Name  Route  Sig  Dispense  Refill  . carbamazepine (CARBATROL) 100 MG 12 hr capsule   Oral   Take 200-300 mg by mouth 3 (three) times daily after meals. Take 3 caps in the morning, 2 caps at noon & 3 caps at night         . enoxaparin (LOVENOX) 100 MG/ML injection   Subcutaneous    Inject 100 mg into the skin at bedtime.          . Fe Fum-FA-B Cmp-C-Zn-Mg-Mn-Cu (HEMOCYTE PLUS) 106-1 MG CAPS   Oral   Take 1 capsule by mouth daily.          . folic acid (FOLVITE) 1 MG tablet   Oral   Take 1 mg by mouth every morning.          Marland Kitchen HYDROcodone-acetaminophen (VICODIN) 5-500 MG per tablet   Oral   Take 1 tablet by mouth every 4 (four) hours as needed for pain. For pain         . levothyroxine (SYNTHROID, LEVOTHROID) 75 MCG tablet   Oral   Take 75 mcg by mouth daily before breakfast.          . loperamide (IMODIUM) 2 MG capsule   Oral   Take 2 mg by mouth daily as needed for diarrhea or loose stools. For diarrhea         . loratadine (CLARITIN) 10 MG tablet   Oral   Take 10 mg by mouth daily.         . meclizine (ANTIVERT) 12.5 MG tablet   Oral   Take 12.5 mg by mouth 3 (three) times daily as needed. For dizziness         . Multiple Vitamin (MULTIVITAMIN) tablet   Oral   Take 1 tablet by mouth every morning.          . pravastatin (PRAVACHOL) 40 MG tablet   Oral   Take 40 mg by mouth every morning.          . RABEprazole (ACIPHEX) 20 MG tablet   Oral   Take 20 mg by mouth every morning.          . sotalol (BETAPACE) 120 MG tablet   Oral   Take 120 mg by mouth 2 (two) times daily.         . Travoprost, BAK Free, (TRAVATAN) 0.004 % SOLN ophthalmic solution   Both Eyes   Place 1 drop into both eyes at bedtime.         . valsartan (DIOVAN) 320 MG tablet   Oral   Take 320 mg by mouth every morning.          . Vitamin D, Ergocalciferol, (DRISDOL) 50000 UNITS CAPS capsule   Oral   Take 50,000 Units by mouth every 7 (seven) days. Takes on Sat         . EPINEPHrine (EPIPEN IJ)   Injection   Inject as directed once as  needed. For allergic reactions         . Menthol (EUCERIN SKIN CALMING) 0.1 % LOTN   Apply externally   Apply 1 application topically daily as needed. For dry skin         . predniSONE (DELTASONE) 20  MG tablet   Oral   Take 2 tablets (40 mg total) by mouth daily.   12 tablet   0    BP 170/64  Pulse 60  Temp(Src) 97.6 F (36.4 C) (Oral)  Resp 16  SpO2 99% Physical Exam  Nursing note and vitals reviewed. Constitutional: She is oriented to person, place, and time. She appears well-developed and well-nourished.  HENT:  Head: Normocephalic and atraumatic.  Right Ear: External ear normal.  Left Ear: External ear normal.  Mouth/Throat: Oropharynx is clear and moist.  Eyes: Pupils are equal, round, and reactive to light.  Neck: Normal range of motion.  Cardiovascular: Normal rate and regular rhythm.   Pulmonary/Chest: Effort normal and breath sounds normal.  Abdominal: Soft.  Musculoskeletal: Normal range of motion. She exhibits no edema.  Lymphadenopathy:    She has no cervical adenopathy.  Neurological: She is alert and oriented to person, place, and time. Coordination normal.  Skin: Skin is warm.    ED Course   Procedures (including critical care time)  Labs Reviewed - No data to display Ct Head Wo Contrast  10/30/2012   *RADIOLOGY REPORT*  Clinical Data: Trigeminal neuralgia.  Left frontal and eye pain. History of CVA.  CT HEAD WITHOUT CONTRAST  Technique:  Contiguous axial images were obtained from the base of the skull through the vertex without contrast.  Comparison: Head CT 11/22/2010 - no report.  Findings: There is no evidence of acute intracranial hemorrhage, mass lesion, brain edema or extra-axial fluid collection.  The ventricles and subarachnoid spaces are appropriately sized for age. There is no CT evidence of acute cortical infarction.  Old strokes in the left cerebellar hemisphere and body of the caudate are unchanged.  There is stable mild periventricular white matter disease.  The visualized paranasal sinuses, mastoid air cells and middle ears are clear. No skull base or orbital abnormalities are identified. The calvarium is intact.  IMPRESSION: Stable examination  with old infarcts as described.  No acute intracranial findings demonstrated.   Original Report Authenticated By: Carey Bullocks, M.D.   1. Trigeminal neuralgia syndrome     MDM   Head CT scan reviewed.  Its normal.  Patient did receive Tylenol with resolution of her headache.  I added 40 mg of prednisone to her medication regime.  She'll take this on a regular basis for one week.  She will also follow up with Dr. Marylou Flesher on Monday.  Hopefully, this will decrease her discomfort  Arman Filter, NP 10/30/12 2128  Arman Filter, NP 10/30/12 2133

## 2012-10-30 NOTE — ED Notes (Signed)
[facility] 105 en route with EMS

## 2012-10-30 NOTE — ED Notes (Addendum)
Patient from home via [facility] with right sided facial pain that started July 29.  Patient has hx trigeminal neuralgia and is seen by a neurologist.  Patient took 500 mg of Vicodin at noon and is prescribed carbatrol er as well for pain and took 3 at 1600.  Hx of stroke and PE.

## 2012-10-30 NOTE — ED Provider Notes (Signed)
Complains of right-sided facial pain typical of trigeminal neuralgia she's had in the past. Her dose of  Carbatrol was recently increased .Patient alert nontoxic. No facial paralysis no distress.  Doug Sou, MD 10/30/12 2136

## 2012-10-31 NOTE — ED Provider Notes (Signed)
Medical screening examination/treatment/procedure(s) were conducted as a shared visit with non-physician practitioner(s) and myself.  I personally evaluated the patient during the encounter  Doug Sou, MD 10/31/12 [phone]

## 2012-11-02 ENCOUNTER — Telehealth: Payer: Self-pay | Admitting: Neurology

## 2012-11-03 NOTE — Telephone Encounter (Signed)
Patient says Friday morning she started having severe facial pain. Went to ER. Was told to f/u w/ neurologist asap. Patient says she has been taking Carbatrol as directed in last OV. Pain is not as bad as it was Friday morning but it is still present at an 8 on pain scale.

## 2012-11-04 ENCOUNTER — Telehealth: Payer: Self-pay | Admitting: Neurology

## 2012-11-04 DIAGNOSIS — B0222 Postherpetic trigeminal neuralgia: Secondary | ICD-10-CM

## 2012-11-04 MED ORDER — CARBAMAZEPINE ER 100 MG PO CP12: 200.0000 mg | ORAL_CAPSULE | Freq: Three times a day (TID) | ORAL | Status: DC

## 2012-11-04 MED ORDER — CARBAMAZEPINE ER 100 MG PO CP12: 100.0000 mg | ORAL_CAPSULE | Freq: Three times a day (TID) | ORAL | Status: DC

## 2012-11-04 NOTE — Telephone Encounter (Signed)
  Please advise her to use 100 mg more for her current nightly dose.  Given the patients age , she needs an urgent RV with NP or me.

## 2012-11-05 DIAGNOSIS — L723 Sebaceous cyst: Secondary | ICD-10-CM | POA: Diagnosis not present

## 2012-11-05 DIAGNOSIS — D2370 Other benign neoplasm of skin of unspecified lower limb, including hip: Secondary | ICD-10-CM | POA: Diagnosis not present

## 2012-11-05 DIAGNOSIS — L821 Other seborrheic keratosis: Secondary | ICD-10-CM | POA: Diagnosis not present

## 2012-11-05 DIAGNOSIS — D485 Neoplasm of uncertain behavior of skin: Secondary | ICD-10-CM | POA: Diagnosis not present

## 2012-11-05 MED ORDER — GABAPENTIN (ONCE-DAILY) 300 & 600 MG PO MISC: 300.0000 mg | Freq: Every evening | ORAL | Status: DC

## 2012-11-05 NOTE — Telephone Encounter (Signed)
The patient states that she could not tolerate higher doses of Tegretol causing her to have ataxia, diplopia and still not controlling her trigeminal pain. She had tried gabapentin at a level doles in 2010 at [facility] on the medication. I have given her today a samples of groggy an extended release form of gabapentin to take 300 mg at night in addition to her is 300 mg of Tegretol a night. The patient is to call in 7-14 days and give her formal report of how her pain  Developed. She is [employer] patient , too.

## 2012-11-05 NOTE — Telephone Encounter (Signed)
I was called that daughter of pt was here for samples of gralise.  I gave sample kit of medication and gave the instructions to her as per note, start with 300mg  po qhs for 7 days then 600mg .  Pt to call back in 7-14 days to let us know how she is.  She would also continue taking the carbatrol 300mg .    She verbalized understanding.

## 2012-11-06 ENCOUNTER — Encounter: Payer: Self-pay | Admitting: Neurology

## 2012-11-10 ENCOUNTER — Other Ambulatory Visit (HOSPITAL_BASED_OUTPATIENT_CLINIC_OR_DEPARTMENT_OTHER): Payer: Medicare Other | Admitting: Lab

## 2012-11-10 ENCOUNTER — Ambulatory Visit (HOSPITAL_BASED_OUTPATIENT_CLINIC_OR_DEPARTMENT_OTHER): Payer: Medicare Other | Admitting: Internal Medicine

## 2012-11-10 ENCOUNTER — Encounter: Payer: Self-pay | Admitting: Internal Medicine

## 2012-11-10 ENCOUNTER — Other Ambulatory Visit: Payer: Self-pay | Admitting: *Deleted

## 2012-11-10 VITALS — BP 155/56 | HR 60 | Temp 97.4°F | Resp 20 | Ht 65.0 in | Wt 234.7 lb

## 2012-11-10 DIAGNOSIS — D539 Nutritional anemia, unspecified: Secondary | ICD-10-CM

## 2012-11-10 LAB — CBC WITH DIFFERENTIAL/PLATELET
Basophils Absolute: 0.1 10*3/uL (ref 0.0–0.1)
Eosinophils Absolute: 0.2 10*3/uL (ref 0.0–0.5)
HGB: 10 g/dL — ABNORMAL LOW (ref 11.6–15.9)
MCV: 83.5 fL (ref 79.5–101.0)
MONO#: 1.1 10*3/uL — ABNORMAL HIGH (ref 0.1–0.9)
MONO%: 18.2 % — ABNORMAL HIGH (ref 0.0–14.0)
NEUT#: 3.4 10*3/uL (ref 1.5–6.5)
RDW: 18.1 % — ABNORMAL HIGH (ref 11.2–14.5)
WBC: 6.2 10*3/uL (ref 3.9–10.3)

## 2012-11-10 NOTE — Progress Notes (Signed)
[facility] [phone]   [phone]  OFFICE PROGRESS NOTE  Cala Bradford, MD [address] [place] [address]  DIAGNOSIS: Anemia of chronic disease plus/minus iron deficiency.   PRIOR THERAPY: None  CURRENT THERAPY: Hemocyte Plus1 capsule by mouth daily.  INTERVAL HISTORY: [name] 79 y.o. female returns to the clinic today for followup visit accompanied by her daughter. The patient continues to complain of mild fatigue with no significant dizzy spells. She had several studies for evaluation of her anemia including iron study and ferritin that were unremarkable. The patient has normal erythropoietin level as well as normal pattern of serum protein electrophoreses. The patient had repeat CBC performed earlier today and she is here for evaluation and discussion of her lab results.  MEDICAL HISTORY: Past Medical History  Diagnosis Date  . Shingles     zoster 1990 on the back and on the abdomen in 2007,facial zoster 2006  . Heart disease   . Hypertension   . Hypercholesterolemia   . A-fib   . Diabetes mellitus without complication   . Glaucoma   . Pulmonary embolism     3  . Acid reflux   . Arthritis   . Anxiety   . Migraine   . Facial pain   . Postherpetic trigeminal neuralgia 10/20/2012    ALLERGIES:  is allergic to ambien; benadryl; betadine antibiotic-moisturize; biaxin; celebrex; ciprofloxacin hcl; combigan; coumadin; coumarin; eye drops; flu virus vaccine; ivp dye; levaquin; naprosyn; pataday; penicillins; plavix; septra; shrimp; voltaren; and iohexol.  MEDICATIONS:  Current Outpatient Prescriptions  Medication Sig Dispense Refill  . carbamazepine (CARBATROL) 100 MG 12 hr capsule Take 1 capsule (100 mg total) by mouth 3 (three) times daily after meals. Take 3 caps in the morning, 2 caps at noon & 3 caps at night  120 capsule  3  . carbamazepine (CARBATROL) 100 MG 12 hr capsule Take 2-3 capsules (200-300 mg total)  by mouth 3 (three) times daily after meals. Take 3 caps in the morning, 2 caps at noon & 4 caps at night  270 capsule  0  . carbamazepine (CARBATROL) 100 MG 12 hr capsule Take 100 mg by mouth. Take 3 caps in the moring, 2 caps at noon and 3 caps at night      . enoxaparin (LOVENOX) 100 MG/ML injection Inject 100 mg into the skin at bedtime.       Marland Kitchen EPINEPHrine (EPIPEN IJ) Inject as directed once as needed. For allergic reactions      . Fe Fum-FA-B Cmp-C-Zn-Mg-Mn-Cu (HEMOCYTE PLUS) 106-1 MG CAPS Take 1 capsule by mouth daily.       . folic acid (FOLVITE) 1 MG tablet Take 1 mg by mouth every morning.       . gabapentin (NEURONTIN) 100 MG capsule Take 300 mg by mouth daily.      . Gabapentin, PHN, ([name] STARTER) 300 & 600 MG MISC Take 300 mg by mouth Nightly.  1 each  0  . HYDROcodone-acetaminophen (VICODIN) 5-500 MG per tablet Take 1 tablet by mouth every 4 (four) hours as needed for pain. For pain      . levothyroxine (SYNTHROID, LEVOTHROID) 75 MCG tablet Take 75 mcg by mouth daily before breakfast.       . loperamide (IMODIUM) 2 MG capsule Take 2 mg by mouth daily as needed for diarrhea or loose stools. For diarrhea      . loratadine (CLARITIN) 10 MG tablet Take 10  mg by mouth daily.      . meclizine (ANTIVERT) 12.5 MG tablet Take 12.5 mg by mouth 3 (three) times daily as needed. For dizziness      . Menthol (EUCERIN SKIN CALMING) 0.1 % LOTN Apply 1 application topically daily as needed. For dry skin      . Multiple Vitamin (MULTIVITAMIN) tablet Take 1 tablet by mouth every morning.       . pravastatin (PRAVACHOL) 40 MG tablet Take 40 mg by mouth every morning.       . predniSONE (DELTASONE) 20 MG tablet Take 2 tablets (40 mg total) by mouth daily.  12 tablet  0  . RABEprazole (ACIPHEX) 20 MG tablet Take 20 mg by mouth every morning.       . sotalol (BETAPACE) 120 MG tablet Take 120 mg by mouth 2 (two) times daily.      . Travoprost, BAK Free, (TRAVATAN) 0.004 % SOLN ophthalmic solution Place 1  drop into both eyes at bedtime.      . valsartan (DIOVAN) 320 MG tablet Take 320 mg by mouth every morning.       . Vitamin D, Ergocalciferol, (DRISDOL) 50000 UNITS CAPS capsule Take 50,000 Units by mouth every 7 (seven) days. Takes on Sat       No current facility-administered medications for this visit.    SURGICAL HISTORY:  Past Surgical History  Procedure Laterality Date  . Blood clots  06/2006    lower abdomen  . Pulmonary embolism surgery  03/2005  . Phlebitis  12/2005  . Cholecystectomy  01/1983  . Biopsies of breast Bilateral 1971  . Migraine speech impairment  07/1963  . Hemorrhoid surgery  1966  . Appendectomy  1945  . Tonsillectomy      1954  . Gallbladder surgery  1980  . Insertion of vena cova filter  2007    REVIEW OF SYSTEMS:  A comprehensive review of systems was negative except for: Constitutional: positive for fatigue   PHYSICAL EXAMINATION: General appearance: alert, cooperative, fatigued and no distress Head: Normocephalic, without obvious abnormality, atraumatic Neck: no adenopathy Lymph nodes: Cervical, supraclavicular, and axillary nodes normal. Resp: clear to auscultation bilaterally Cardio: regular rate and rhythm, S1, S2 normal, no murmur, click, rub or gallop GI: soft, non-tender; bowel sounds normal; no masses,  no organomegaly Extremities: extremities normal, atraumatic, no cyanosis or edema  ECOG PERFORMANCE STATUS: 1 - Symptomatic but completely ambulatory  Blood pressure 155/56, pulse 60, temperature 97.4 F (36.3 C), temperature source Oral, resp. rate 20, height 5\' 5"  (1.651 m), weight 234 lb 11.2 oz (106.459 kg).  LABORATORY DATA: Lab Results  Component Value Date   WBC 6.2 11/10/2012   HGB 10.0* 11/10/2012   HCT 29.5* 11/10/2012   MCV 83.5 11/10/2012   PLT 134* 11/10/2012      Chemistry      Component Value Date/Time   NA 132* 10/23/2012 1119   NA 137 05/27/2009 0520   K 5.0 10/23/2012 1119   K 3.9 05/27/2009 0520   CL 105 05/27/2009 0520    CO2 22 10/23/2012 1119   CO2 24 05/27/2009 0520   BUN 19.4 10/23/2012 1119   BUN 19 05/27/2009 0520   CREATININE 1.1 10/23/2012 1119   CREATININE 1.22* 05/27/2009 0520      Component Value Date/Time   CALCIUM 8.8 10/23/2012 1119   CALCIUM 8.7 05/27/2009 0520   ALKPHOS 86 10/21/2012 1535   ALKPHOS 91 05/26/2009 1120   AST 12 10/21/2012 1535  AST 16 05/26/2009 1120   ALT 7 10/21/2012 1535   ALT 10 05/26/2009 1120   BILITOT 0.26 10/21/2012 1535   BILITOT 0.5 05/26/2009 1120       RADIOGRAPHIC STUDIES: Ct Head Wo Contrast  10/30/2012   *RADIOLOGY REPORT*  Clinical Data: Trigeminal neuralgia.  Left frontal and eye pain. History of CVA.  CT HEAD WITHOUT CONTRAST  Technique:  Contiguous axial images were obtained from the base of the skull through the vertex without contrast.  Comparison: Head CT 11/22/2010 - no report.  Findings: There is no evidence of acute intracranial hemorrhage, mass lesion, brain edema or extra-axial fluid collection.  The ventricles and subarachnoid spaces are appropriately sized for age. There is no CT evidence of acute cortical infarction.  Old strokes in the left cerebellar hemisphere and body of the caudate are unchanged.  There is stable mild periventricular white matter disease.  The visualized paranasal sinuses, mastoid air cells and middle ears are clear. No skull base or orbital abnormalities are identified. The calvarium is intact.  IMPRESSION: Stable examination with old infarcts as described.  No acute intracranial findings demonstrated.   Original Report Authenticated By: Carey Bullocks, M.D.    ASSESSMENT AND PLAN: This is a very pleasant 79 years old white female with anemia of chronic disease plus minus iron deficiency currently on treatment with Hemocyte plus capsule and tolerating it fairly well. The patient continues to have mild fatigue. Her recent blood work did not show any significant abnormalities to explain her anemia beside the anemia of chronic disease versus  myelodysplastic syndrome. I discussed the lab result with the patient and her daughter today. I gave her the option of continuous observation and colon one tablet versus proceeding with bone marrow biopsy and aspirate to rule out myelodysplasia. The patient would like to hold on the bone marrow biopsy and aspirate for now but I would consider it in the future if she has any significant decline in her hemoglobin and hematocrit or any other cytopenia. I recommended for the patient to follow up with her primary care physician as scheduled and that'll be happy to see her in the future on as-needed basis.  The patient voices understanding of current disease status and treatment options and is in agreement with the current care plan.  All questions were answered. The patient knows to call the clinic with any problems, questions or concerns. We can certainly see the patient much sooner if necessary.

## 2012-11-12 DIAGNOSIS — H4011X0 Primary open-angle glaucoma, stage unspecified: Secondary | ICD-10-CM | POA: Diagnosis not present

## 2012-11-12 DIAGNOSIS — H409 Unspecified glaucoma: Secondary | ICD-10-CM | POA: Diagnosis not present

## 2012-11-16 ENCOUNTER — Telehealth: Payer: Self-pay | Admitting: Neurology

## 2012-11-16 DIAGNOSIS — B0222 Postherpetic trigeminal neuralgia: Secondary | ICD-10-CM

## 2012-11-16 NOTE — Telephone Encounter (Signed)
Patient states that her facial pain is about a six. Patient states. She is taking her [name] 600mg  and her Carbatrol .  Dr. Pearlean Brownie please advise me on what to do for patient ? Dr.Dohmeier is out of the office until 11-19-2012.

## 2012-11-19 ENCOUNTER — Ambulatory Visit: Payer: Self-pay | Admitting: Nurse Practitioner

## 2012-11-19 NOTE — Telephone Encounter (Signed)
Dr Marylou Flesher to address on return

## 2012-11-20 DIAGNOSIS — N6489 Other specified disorders of breast: Secondary | ICD-10-CM | POA: Diagnosis not present

## 2012-11-20 DIAGNOSIS — Z09 Encounter for follow-up examination after completed treatment for conditions other than malignant neoplasm: Secondary | ICD-10-CM | POA: Diagnosis not present

## 2012-11-24 ENCOUNTER — Other Ambulatory Visit: Payer: Self-pay

## 2012-11-24 DIAGNOSIS — B0222 Postherpetic trigeminal neuralgia: Secondary | ICD-10-CM

## 2012-11-24 MED ORDER — CARBAMAZEPINE ER 100 MG PO CP12: ORAL_CAPSULE | ORAL | Status: DC

## 2012-11-25 ENCOUNTER — Encounter: Payer: Self-pay | Admitting: Neurology

## 2012-11-25 DIAGNOSIS — I6350 Cerebral infarction due to unspecified occlusion or stenosis of unspecified cerebral artery: Secondary | ICD-10-CM | POA: Insufficient documentation

## 2012-11-26 ENCOUNTER — Other Ambulatory Visit: Payer: Self-pay

## 2012-11-26 DIAGNOSIS — B0222 Postherpetic trigeminal neuralgia: Secondary | ICD-10-CM

## 2012-11-26 MED ORDER — GABAPENTIN (ONCE-DAILY) 300 & 600 MG PO MISC: 300.0000 mg | Freq: Every evening | ORAL | Status: DC

## 2012-11-26 NOTE — Telephone Encounter (Signed)
[name] starter pack was placed at the front desk for pick up.  Lot [id number] Exp 01/2015.  Patient will be in to pick up per previous phone note.  She will advance to 900mg  at night and call us back to report how she is doing on this med.  Patient had an appt scheduled 08/28, however it was cancelled.  She now has a follow up scheduled in Jan 2015 with CM.

## 2012-11-26 NOTE — Telephone Encounter (Signed)
i spoke to [name] yesterday- she will advance to 900 mg Graise at night , keeps the carbatrol to 3 in am , 2 at lunch and 3 at night. The pain has gotten better over the last 10 days. She feels much less in the daytime than she did on 11-15-22  This patient will pick up gralise samples [facility]  DX>  Postherpectic facial neuralgia.

## 2012-12-09 DIAGNOSIS — Z7901 Long term (current) use of anticoagulants: Secondary | ICD-10-CM | POA: Diagnosis not present

## 2012-12-09 DIAGNOSIS — I4891 Unspecified atrial fibrillation: Secondary | ICD-10-CM | POA: Diagnosis not present

## 2012-12-09 DIAGNOSIS — R079 Chest pain, unspecified: Secondary | ICD-10-CM | POA: Diagnosis not present

## 2012-12-17 ENCOUNTER — Telehealth: Payer: Self-pay | Admitting: Neurology

## 2012-12-17 DIAGNOSIS — N183 Chronic kidney disease, stage 3 unspecified: Secondary | ICD-10-CM | POA: Diagnosis not present

## 2012-12-17 DIAGNOSIS — Z1331 Encounter for screening for depression: Secondary | ICD-10-CM | POA: Diagnosis not present

## 2012-12-17 DIAGNOSIS — Z23 Encounter for immunization: Secondary | ICD-10-CM | POA: Diagnosis not present

## 2012-12-17 DIAGNOSIS — I129 Hypertensive chronic kidney disease with stage 1 through stage 4 chronic kidney disease, or unspecified chronic kidney disease: Secondary | ICD-10-CM | POA: Diagnosis not present

## 2012-12-17 DIAGNOSIS — S8000XA Contusion of unspecified knee, initial encounter: Secondary | ICD-10-CM | POA: Diagnosis not present

## 2012-12-17 DIAGNOSIS — G4730 Sleep apnea, unspecified: Secondary | ICD-10-CM | POA: Diagnosis not present

## 2012-12-17 DIAGNOSIS — Z0000 Encounter for general adult medical examination without abnormal findings: Secondary | ICD-10-CM | POA: Diagnosis not present

## 2012-12-18 NOTE — Telephone Encounter (Signed)
Returned call. Line was busy.

## 2012-12-23 ENCOUNTER — Telehealth: Payer: Self-pay | Admitting: Neurology

## 2012-12-23 DIAGNOSIS — B0222 Postherpetic trigeminal neuralgia: Secondary | ICD-10-CM

## 2012-12-23 MED ORDER — GABAPENTIN (ONCE-DAILY) 300 MG PO TABS: 1.0000 | ORAL_TABLET | ORAL | Status: DC

## 2012-12-23 NOTE — Addendum Note (Signed)
Addended by: Melvyn Novas on: 12/23/2012 04:27 PM   Modules accepted: Orders

## 2012-12-23 NOTE — Telephone Encounter (Signed)
I called and spoke with the patient.  She prefers the Rx be called into [facility].  She asked me what her co-pay through Medicare would be, and I advised her that information is not provided to [place].  Suggested she contact her ins or pharmacy for co-pay info.  Told her this was a Brand Name Only Drug.  She said that was fine because she doesn't take generic medications.

## 2012-12-30 ENCOUNTER — Ambulatory Visit (INDEPENDENT_AMBULATORY_CARE_PROVIDER_SITE_OTHER): Payer: Medicare Other | Admitting: Podiatry

## 2012-12-30 ENCOUNTER — Encounter: Payer: Self-pay | Admitting: Podiatry

## 2012-12-30 VITALS — BP 147/69 | HR 59 | Resp 16

## 2012-12-30 DIAGNOSIS — M79609 Pain in unspecified limb: Secondary | ICD-10-CM

## 2012-12-30 DIAGNOSIS — Q828 Other specified congenital malformations of skin: Secondary | ICD-10-CM | POA: Insufficient documentation

## 2012-12-30 DIAGNOSIS — B351 Tinea unguium: Secondary | ICD-10-CM | POA: Diagnosis not present

## 2012-12-30 NOTE — Progress Notes (Signed)
  Subjective:    Patient ID: [name], female    DOB: 08-21-1929, 79 y.o.   MRN: [id number]  HPI This patient presents for ongoing skin and nail debridement at approximately 3 month intervals.    Review of Systems  Constitutional: Positive for fatigue.  Eyes: Negative.   Respiratory: Negative.   Cardiovascular: Positive for leg swelling.  Gastrointestinal: Positive for diarrhea.  Endocrine: Positive for cold intolerance.  Genitourinary: Negative.   Musculoskeletal: Positive for arthralgias and gait problem.  Skin: Negative.   Allergic/Immunologic: Negative.   Neurological: Positive for headaches.  Hematological: Bruises/bleeds easily.  Psychiatric/Behavioral: Negative.        Objective:   Physical Exam Elongated hypertrophic toenails with texture and color changes x10 with palpable tenderness in all nail plates. Nucleated plantar keratoses sub-right first MPJ noted.       Assessment & Plan:  Symptomatic onychomycosis x10 Porokeratosis x1  The nails and keratoses were debrided back without any bleeding. Reappoint at three-month intervals  Coleman Kalas C.Leeanne Deed, DPM

## 2013-01-02 DIAGNOSIS — S40029A Contusion of unspecified upper arm, initial encounter: Secondary | ICD-10-CM | POA: Diagnosis not present

## 2013-01-20 ENCOUNTER — Telehealth: Payer: Self-pay | Admitting: *Deleted

## 2013-01-20 DIAGNOSIS — B0222 Postherpetic trigeminal neuralgia: Secondary | ICD-10-CM

## 2013-01-20 NOTE — Telephone Encounter (Signed)
Wants to continue with the gralise  600 mg dosage

## 2013-01-21 NOTE — Telephone Encounter (Signed)
Please put in refill for gralise 600 mg .

## 2013-01-22 DIAGNOSIS — M25819 Other specified joint disorders, unspecified shoulder: Secondary | ICD-10-CM | POA: Diagnosis not present

## 2013-01-22 DIAGNOSIS — M542 Cervicalgia: Secondary | ICD-10-CM | POA: Diagnosis not present

## 2013-02-03 DIAGNOSIS — M542 Cervicalgia: Secondary | ICD-10-CM | POA: Diagnosis not present

## 2013-02-03 DIAGNOSIS — M25519 Pain in unspecified shoulder: Secondary | ICD-10-CM | POA: Diagnosis not present

## 2013-02-03 DIAGNOSIS — M25819 Other specified joint disorders, unspecified shoulder: Secondary | ICD-10-CM | POA: Diagnosis not present

## 2013-02-04 NOTE — Telephone Encounter (Signed)
Please let her know- there is no reason to increase if she achieved the desired affect on a lower dose. She shall continue on 600 mg of  [name]. Thank you.

## 2013-02-05 MED ORDER — GABAPENTIN (ONCE-DAILY) 300 MG PO TABS: 1.0000 | ORAL_TABLET | ORAL | Status: DC

## 2013-02-05 NOTE — Telephone Encounter (Signed)
Called and left patient VM. Continue at 600 mg, per Dr. Vickey Huger, if that is effective. Also, we called in new Rx for #90 with 3 refills

## 2013-02-12 DIAGNOSIS — M25819 Other specified joint disorders, unspecified shoulder: Secondary | ICD-10-CM | POA: Diagnosis not present

## 2013-02-12 DIAGNOSIS — M25519 Pain in unspecified shoulder: Secondary | ICD-10-CM | POA: Diagnosis not present

## 2013-02-12 DIAGNOSIS — M542 Cervicalgia: Secondary | ICD-10-CM | POA: Diagnosis not present

## 2013-02-16 DIAGNOSIS — M542 Cervicalgia: Secondary | ICD-10-CM | POA: Diagnosis not present

## 2013-02-16 DIAGNOSIS — M25819 Other specified joint disorders, unspecified shoulder: Secondary | ICD-10-CM | POA: Diagnosis not present

## 2013-02-16 DIAGNOSIS — M25519 Pain in unspecified shoulder: Secondary | ICD-10-CM | POA: Diagnosis not present

## 2013-02-22 DIAGNOSIS — M25819 Other specified joint disorders, unspecified shoulder: Secondary | ICD-10-CM | POA: Diagnosis not present

## 2013-02-22 DIAGNOSIS — M25519 Pain in unspecified shoulder: Secondary | ICD-10-CM | POA: Diagnosis not present

## 2013-02-22 DIAGNOSIS — G4733 Obstructive sleep apnea (adult) (pediatric): Secondary | ICD-10-CM | POA: Diagnosis not present

## 2013-02-22 DIAGNOSIS — M542 Cervicalgia: Secondary | ICD-10-CM | POA: Diagnosis not present

## 2013-02-23 ENCOUNTER — Other Ambulatory Visit: Payer: Self-pay | Admitting: Neurology

## 2013-03-02 DIAGNOSIS — G4733 Obstructive sleep apnea (adult) (pediatric): Secondary | ICD-10-CM | POA: Diagnosis not present

## 2013-03-06 ENCOUNTER — Emergency Department (HOSPITAL_COMMUNITY): Payer: Medicare Other

## 2013-03-06 ENCOUNTER — Inpatient Hospital Stay (HOSPITAL_COMMUNITY)
Admission: EM | Admit: 2013-03-06 | Discharge: 2013-03-09 | DRG: 309 | Disposition: A | Discharge status: Home / Self Care | Payer: Medicare Other | Attending: Interventional Cardiology | Admitting: Interventional Cardiology

## 2013-03-06 ENCOUNTER — Encounter (HOSPITAL_COMMUNITY): Payer: Self-pay | Admitting: Emergency Medicine

## 2013-03-06 DIAGNOSIS — E785 Hyperlipidemia, unspecified: Secondary | ICD-10-CM | POA: Diagnosis present

## 2013-03-06 DIAGNOSIS — D649 Anemia, unspecified: Secondary | ICD-10-CM | POA: Diagnosis present

## 2013-03-06 DIAGNOSIS — I4891 Unspecified atrial fibrillation: Secondary | ICD-10-CM

## 2013-03-06 DIAGNOSIS — K219 Gastro-esophageal reflux disease without esophagitis: Secondary | ICD-10-CM | POA: Diagnosis present

## 2013-03-06 DIAGNOSIS — Z86711 Personal history of pulmonary embolism: Secondary | ICD-10-CM | POA: Diagnosis not present

## 2013-03-06 DIAGNOSIS — M129 Arthropathy, unspecified: Secondary | ICD-10-CM | POA: Diagnosis present

## 2013-03-06 DIAGNOSIS — E2749 Other adrenocortical insufficiency: Secondary | ICD-10-CM | POA: Diagnosis present

## 2013-03-06 DIAGNOSIS — Z8673 Personal history of transient ischemic attack (TIA), and cerebral infarction without residual deficits: Secondary | ICD-10-CM | POA: Diagnosis present

## 2013-03-06 DIAGNOSIS — R404 Transient alteration of awareness: Secondary | ICD-10-CM | POA: Diagnosis not present

## 2013-03-06 DIAGNOSIS — E119 Type 2 diabetes mellitus without complications: Secondary | ICD-10-CM | POA: Diagnosis present

## 2013-03-06 DIAGNOSIS — E871 Hypo-osmolality and hyponatremia: Secondary | ICD-10-CM | POA: Diagnosis not present

## 2013-03-06 DIAGNOSIS — I517 Cardiomegaly: Secondary | ICD-10-CM | POA: Diagnosis present

## 2013-03-06 DIAGNOSIS — H409 Unspecified glaucoma: Secondary | ICD-10-CM | POA: Diagnosis present

## 2013-03-06 DIAGNOSIS — Z86718 Personal history of other venous thrombosis and embolism: Secondary | ICD-10-CM | POA: Diagnosis present

## 2013-03-06 DIAGNOSIS — Z7901 Long term (current) use of anticoagulants: Secondary | ICD-10-CM | POA: Diagnosis present

## 2013-03-06 DIAGNOSIS — I495 Sick sinus syndrome: Secondary | ICD-10-CM | POA: Diagnosis not present

## 2013-03-06 DIAGNOSIS — E7800 Pure hypercholesterolemia, unspecified: Secondary | ICD-10-CM | POA: Diagnosis present

## 2013-03-06 DIAGNOSIS — R42 Dizziness and giddiness: Secondary | ICD-10-CM | POA: Diagnosis present

## 2013-03-06 DIAGNOSIS — E669 Obesity, unspecified: Secondary | ICD-10-CM | POA: Diagnosis present

## 2013-03-06 DIAGNOSIS — E875 Hyperkalemia: Secondary | ICD-10-CM | POA: Diagnosis present

## 2013-03-06 DIAGNOSIS — N183 Chronic kidney disease, stage 3 unspecified: Secondary | ICD-10-CM | POA: Diagnosis present

## 2013-03-06 DIAGNOSIS — E039 Hypothyroidism, unspecified: Secondary | ICD-10-CM | POA: Diagnosis present

## 2013-03-06 DIAGNOSIS — R001 Bradycardia, unspecified: Secondary | ICD-10-CM

## 2013-03-06 DIAGNOSIS — I498 Other specified cardiac arrhythmias: Secondary | ICD-10-CM | POA: Diagnosis not present

## 2013-03-06 DIAGNOSIS — R5381 Other malaise: Secondary | ICD-10-CM | POA: Diagnosis present

## 2013-03-06 DIAGNOSIS — R55 Syncope and collapse: Secondary | ICD-10-CM | POA: Diagnosis present

## 2013-03-06 DIAGNOSIS — Z833 Family history of diabetes mellitus: Secondary | ICD-10-CM | POA: Diagnosis present

## 2013-03-06 DIAGNOSIS — I129 Hypertensive chronic kidney disease with stage 1 through stage 4 chronic kidney disease, or unspecified chronic kidney disease: Secondary | ICD-10-CM | POA: Diagnosis present

## 2013-03-06 DIAGNOSIS — R110 Nausea: Secondary | ICD-10-CM | POA: Diagnosis present

## 2013-03-06 DIAGNOSIS — Z8249 Family history of ischemic heart disease and other diseases of the circulatory system: Secondary | ICD-10-CM | POA: Diagnosis not present

## 2013-03-06 DIAGNOSIS — R0989 Other specified symptoms and signs involving the circulatory and respiratory systems: Secondary | ICD-10-CM | POA: Diagnosis not present

## 2013-03-06 DIAGNOSIS — Z8672 Personal history of thrombophlebitis: Secondary | ICD-10-CM | POA: Diagnosis not present

## 2013-03-06 DIAGNOSIS — I480 Paroxysmal atrial fibrillation: Secondary | ICD-10-CM | POA: Diagnosis present

## 2013-03-06 DIAGNOSIS — I359 Nonrheumatic aortic valve disorder, unspecified: Secondary | ICD-10-CM | POA: Diagnosis not present

## 2013-03-06 HISTORY — DX: Long term (current) use of anticoagulants: Z79.01

## 2013-03-06 HISTORY — DX: Bradycardia, unspecified: R00.1

## 2013-03-06 LAB — CBC WITH DIFFERENTIAL/PLATELET
Eosinophils Absolute: 0.3 10*3/uL (ref 0.0–0.7)
Eosinophils Relative: 4 % (ref 0–5)
HCT: 30.3 % — ABNORMAL LOW (ref 36.0–46.0)
Hemoglobin: 10.2 g/dL — ABNORMAL LOW (ref 12.0–15.0)
Lymphocytes Relative: 18 % (ref 12–46)
Lymphs Abs: 1.1 10*3/uL (ref 0.7–4.0)
MCH: 28.3 pg (ref 26.0–34.0)
MCV: 84.2 fL (ref 78.0–100.0)
Monocytes Absolute: 0.9 10*3/uL (ref 0.1–1.0)
Monocytes Relative: 16 % — ABNORMAL HIGH (ref 3–12)
Platelets: 146 10*3/uL — ABNORMAL LOW (ref 150–400)
RBC: 3.6 MIL/uL — ABNORMAL LOW (ref 3.87–5.11)

## 2013-03-06 LAB — COMPREHENSIVE METABOLIC PANEL
BUN: 20 mg/dL (ref 6–23)
CO2: 22 mEq/L (ref 19–32)
Calcium: 8.8 mg/dL (ref 8.4–10.5)
Creatinine, Ser: 1.41 mg/dL — ABNORMAL HIGH (ref 0.50–1.10)
GFR calc Af Amer: 39 mL/min — ABNORMAL LOW (ref >90)
GFR calc non Af Amer: 34 mL/min — ABNORMAL LOW (ref >90)
Glucose, Bld: 112 mg/dL — ABNORMAL HIGH (ref 70–99)
Total Protein: 7.1 g/dL (ref 6.0–8.3)

## 2013-03-06 LAB — LACTIC ACID, PLASMA: Lactic Acid, Venous: 1.5 mmol/L (ref 0.5–2.2)

## 2013-03-06 LAB — POCT I-STAT, CHEM 8
BUN: 24 mg/dL — ABNORMAL HIGH (ref 6–23)
Chloride: 99 mEq/L (ref 96–112)
Creatinine, Ser: 1.4 mg/dL — ABNORMAL HIGH (ref 0.50–1.10)
Glucose, Bld: 133 mg/dL — ABNORMAL HIGH (ref 70–99)
HCT: 25 % — ABNORMAL LOW (ref 36.0–46.0)
Hemoglobin: 8.5 g/dL — ABNORMAL LOW (ref 12.0–15.0)
Sodium: 130 mEq/L — ABNORMAL LOW (ref 135–145)
TCO2: 22 mmol/L (ref 0–100)

## 2013-03-06 LAB — BASIC METABOLIC PANEL
BUN: 22 mg/dL (ref 6–23)
Glucose, Bld: 137 mg/dL — ABNORMAL HIGH (ref 70–99)
Potassium: 5.3 mEq/L — ABNORMAL HIGH (ref 3.5–5.1)
Sodium: 128 mEq/L — ABNORMAL LOW (ref 135–145)

## 2013-03-06 LAB — MAGNESIUM: Magnesium: 1.9 mg/dL (ref 1.5–2.5)

## 2013-03-06 LAB — TROPONIN I: Troponin I: <0.3 ng/mL (ref <0.30)

## 2013-03-06 MED ORDER — ONDANSETRON HCL 4 MG/2ML IJ SOLN
4.0000 mg | Freq: Once | INTRAMUSCULAR | Status: AC
  Administered 2013-03-06: 4 mg via INTRAVENOUS
  Filled 2013-03-06: qty 2

## 2013-03-06 MED ORDER — DOPAMINE-DEXTROSE 3.2-5 MG/ML-% IV SOLN
2.0000 ug/kg/min | INTRAVENOUS | Status: DC
  Administered 2013-03-06 – 2013-03-07 (×2): 5 ug/kg/min via INTRAVENOUS
  Filled 2013-03-06 (×2): qty 250

## 2013-03-06 MED ORDER — ATROPINE SULFATE 0.1 MG/ML IJ SOLN
0.5000 mg | Freq: Once | INTRAMUSCULAR | Status: AC
  Administered 2013-03-06: 0.5 mg via INTRAVENOUS

## 2013-03-06 MED ORDER — GLUCAGON HCL (RDNA) 1 MG IJ SOLR
1.0000 mg | Freq: Once | INTRAMUSCULAR | Status: AC
  Administered 2013-03-06: 1 mg via INTRAVENOUS
  Filled 2013-03-06: qty 1

## 2013-03-06 MED ORDER — ATROPINE SULFATE 0.1 MG/ML IJ SOLN: 1.0000 mg | Freq: Once | INTRAMUSCULAR | Status: DC

## 2013-03-06 MED ORDER — ATROPINE SULFATE 1 MG/ML IJ SOLN
0.5000 mg | Freq: Once | INTRAMUSCULAR | Status: DC
  Filled 2013-03-06: qty 1

## 2013-03-06 MED ORDER — ENOXAPARIN SODIUM 100 MG/ML ~~LOC~~ SOLN
100.0000 mg | Freq: Every day | SUBCUTANEOUS | Status: DC
  Administered 2013-03-07 (×2): 100 mg via SUBCUTANEOUS
  Filled 2013-03-06 (×3): qty 1

## 2013-03-06 MED ORDER — SODIUM CHLORIDE 0.9 % IV SOLN
1.0000 g | Freq: Once | INTRAVENOUS | Status: AC
  Administered 2013-03-06: 1 g via INTRAVENOUS
  Filled 2013-03-06 (×2): qty 10

## 2013-03-06 NOTE — ED Notes (Signed)
Received pt from an urgent care center with c/o dizziness onset earlier this morning. Upon arrival to urgent care pt HR noted to be 28. Pt denies chest pain, c/o right leg pain and shortness of breath with exertion. Pt AAOx3, talking in complete sentences without difficulty.

## 2013-03-06 NOTE — ED Provider Notes (Signed)
CSN: [id number]     Arrival date & time 03/06/13  1834 History   First MD Initiated Contact with Patient 03/06/13 1849     Chief Complaint  Patient presents with  . Dizziness  . Bradycardia   (Consider location/radiation/quality/duration/timing/severity/associated sxs/prior Treatment) HPI [name] is a 79 y.o. female who presents to the emergency department for concern of fatigue and dizziness.  Patient reports that this started two days ago.  She reports that she just feels weak and as if she is going to pass out.  Never felt like this before.  Never actually passed out.  Symptoms moderate in severity.  No CP/SOB.  No fevers.  No other symptoms.  Called EMS and was found to have a pulse of 20.  Patient mentating appropriately.  BP 90s systolic.  No prehospital interventions.  Past Medical History  Diagnosis Date  . Shingles     zoster 1990 on the back and on the abdomen in 2007,facial zoster 2006  . Heart disease   . Hypertension   . Hypercholesterolemia   . A-fib   . Diabetes mellitus without complication   . Glaucoma   . Pulmonary embolism     3  . Acid reflux   . Arthritis   . Anxiety   . Migraine   . Facial pain   . Postherpetic trigeminal neuralgia 10/20/2012  . Unspecified cerebral artery occlusion with cerebral infarction   . Memory loss    Past Surgical History  Procedure Laterality Date  . Blood clots  06/2006    lower abdomen  . Pulmonary embolism surgery  03/2005  . Phlebitis  12/2005  . Cholecystectomy  01/1983  . Biopsies of breast Bilateral 1971  . Migraine speech impairment  07/1963  . Hemorrhoid surgery  1966  . Appendectomy  1945  . Tonsillectomy      1954  . Gallbladder surgery  1980  . Insertion of vena cova filter  2007  . Leg / ankle soft tissue biopsy Right     precancerous   Family History  Problem Relation Age of Onset  . Diabetes Mother   . Hypertension Mother   . Hypertension Father   . Heart attack Brother     2 half brothers   . Glaucoma Other   . Glaucoma Other    History  Substance Use Topics  . Smoking status: Never Smoker   . Smokeless tobacco: Never Used  . Alcohol Use: Yes     Comment: very moderate, none  in the last two months   OB History   Grav Para Term Preterm Abortions TAB SAB Ect Mult Living                 Review of Systems  Constitutional: Positive for fatigue. Negative for fever and chills.  HENT: Negative for congestion and rhinorrhea.   Respiratory: Negative for cough and shortness of breath.   Cardiovascular: Negative for chest pain, palpitations and leg swelling.  Gastrointestinal: Negative for nausea, vomiting, abdominal pain, diarrhea and abdominal distention.  Endocrine: Negative for polyuria.  Genitourinary: Negative for dysuria.  Musculoskeletal: Negative for neck pain and neck stiffness.  Skin: Negative for rash.  Neurological: Negative for headaches.  Psychiatric/Behavioral: Negative.     Allergies  Ambien; Benadryl; Betadine antibiotic-moisturize; Biaxin; Celebrex; Ciprofloxacin hcl; Combigan; Coumadin; Coumarin; Eye drops; Flu virus vaccine; Ivp dye; Levaquin; Naprosyn; Pataday; Penicillins; Plavix; Septra; Shrimp; Voltaren; and Iohexol  Home Medications   Current Outpatient Rx  Name  Route  Sig  Dispense  Refill  . CARBATROL 100 MG 12 hr capsule      TAKE THREE CAPSULES BY MOUTH EVERY MORNING, TWO CAPSULES AT NOON, AND THREE CAPSULES AT BEDTIME. TAKE AFTER MEALS.   240 capsule   1   . enoxaparin (LOVENOX) 100 MG/ML injection   Subcutaneous   Inject 100 mg into the skin at bedtime.          Marland Kitchen EPINEPHrine (EPIPEN IJ)   Injection   Inject as directed once as needed. For allergic reactions         . Fe Fum-FA-B Cmp-C-Zn-Mg-Mn-Cu (HEMOCYTE PLUS) 106-1 MG CAPS   Oral   Take 1 capsule by mouth daily.          . folic acid (FOLVITE) 1 MG tablet   Oral   Take 1 mg by mouth every morning.          . gabapentin (NEURONTIN) 100 MG capsule   Oral    Take 300 mg by mouth daily.         . Gabapentin, PHN, ([name]) 300 MG TABS   Oral   Take 1 Bottle by mouth as directed. Take 300mg  po at night x 7 days, then tk 600mg  at night x 7 days then tk 900mg  po at night   90 tablet   3   . HYDROcodone-acetaminophen (VICODIN) 5-500 MG per tablet   Oral   Take 1 tablet by mouth every 4 (four) hours as needed for pain. For pain         . levothyroxine (SYNTHROID, LEVOTHROID) 75 MCG tablet   Oral   Take 75 mcg by mouth daily before breakfast.          . loperamide (IMODIUM) 2 MG capsule   Oral   Take 2 mg by mouth daily as needed for diarrhea or loose stools. For diarrhea         . loratadine (CLARITIN) 10 MG tablet   Oral   Take 10 mg by mouth daily.         . meclizine (ANTIVERT) 12.5 MG tablet   Oral   Take 12.5 mg by mouth 3 (three) times daily as needed. For dizziness         . Menthol (EUCERIN SKIN CALMING) 0.1 % LOTN   Apply externally   Apply 1 application topically daily as needed. For dry skin         . Multiple Vitamin (MULTIVITAMIN) tablet   Oral   Take 1 tablet by mouth every morning.          . pravastatin (PRAVACHOL) 40 MG tablet   Oral   Take 40 mg by mouth every morning.          . predniSONE (DELTASONE) 20 MG tablet   Oral   Take 2 tablets (40 mg total) by mouth daily.   12 tablet   0   . RABEprazole (ACIPHEX) 20 MG tablet   Oral   Take 20 mg by mouth every morning.          . sotalol (BETAPACE) 120 MG tablet   Oral   Take 120 mg by mouth 2 (two) times daily.         . Travoprost, BAK Free, (TRAVATAN) 0.004 % SOLN ophthalmic solution   Both Eyes   Place 1 drop into both eyes at bedtime.         . valsartan (DIOVAN) 320 MG tablet   Oral  Take 320 mg by mouth every morning.          . Vitamin D, Ergocalciferol, (DRISDOL) 50000 UNITS CAPS capsule   Oral   Take 50,000 Units by mouth every 7 (seven) days. Takes on Sat          BP 104/44  Pulse 31  Temp(Src) 97.3 F  (36.3 C) (Oral)  Resp 18  Ht 5\' 5"  (1.651 m)  Wt 236 lb (107.049 kg)  BMI 39.27 kg/m2  SpO2 98% Physical Exam  Nursing note and vitals reviewed. Constitutional: She is oriented to person, place, and time. She appears well-developed and well-nourished. No distress.  HENT:  Head: Normocephalic and atraumatic.  Right Ear: External ear normal.  Left Ear: External ear normal.  Nose: Nose normal.  Mouth/Throat: Oropharynx is clear and moist. No oropharyngeal exudate.  Eyes: EOM are normal. Pupils are equal, round, and reactive to light.  Neck: Normal range of motion. Neck supple. No tracheal deviation present.  Cardiovascular: Bradycardia present.   Pulmonary/Chest: Effort normal and breath sounds normal. No stridor. No respiratory distress. She has no wheezes. She has no rales.  Abdominal: Soft. She exhibits no distension. There is no tenderness. There is no rebound.  Musculoskeletal: Normal range of motion.  Neurological: She is alert and oriented to person, place, and time.  Skin: Skin is warm and dry. She is not diaphoretic.    ED Course  Procedures (including critical care time) Labs Review Labs Reviewed  CBC WITH DIFFERENTIAL - Abnormal; Notable for the following:    RBC 3.60 (*)    Hemoglobin 10.2 (*)    HCT 30.3 (*)    RDW 17.1 (*)    Platelets 146 (*)    Monocytes Relative 16 (*)    All other components within normal limits  COMPREHENSIVE METABOLIC PANEL - Abnormal; Notable for the following:    Sodium 128 (*)    Potassium 6.1 (*)    Glucose, Bld 112 (*)    Creatinine, Ser 1.41 (*)    Total Bilirubin 0.2 (*)    GFR calc non Af Amer 34 (*)    GFR calc Af Amer 39 (*)    All other components within normal limits  LACTIC ACID, PLASMA  TROPONIN I  BASIC METABOLIC PANEL   Imaging Review Dg Chest Port 1 View  03/06/2013   CLINICAL DATA:  Bradycardia.  EXAM: PORTABLE CHEST - 1 VIEW  COMPARISON:  06/05/2010.  FINDINGS: The heart is enlarged. There is moderate vascular  congestion. Monitoring devices overlie the chest obscuring parts of the lung fields. There are no obvious focal infiltrates or CHF. Osseous demineralization is suspected. Similar appearance to priors.  IMPRESSION: Cardiomegaly with mild vascular congestion. No definite active infiltrates.   Electronically Signed   By: Davonna Belling M.D.   On: 03/06/2013 19:29    EKG Interpretation    Date/Time:  Saturday March 06 2013 18:39:28 EST Ventricular Rate:  29 PR Interval:    QRS Duration: 91 QT Interval:  525 QTC Calculation: 364 R Axis:   -7 Text Interpretation:  Junctional rhythm Left ventricular hypertrophy Nonspecific T abnormalities, inferior leads marked bradycardia compared to previous ECG Confirmed by POLLINA  MD, CHRISTOPHER ([id number]) on 03/06/2013 7:26:53 PM            MDM   1. Bradycardia   2. Atrial fibrillation     [name] is a 79 y.o. female with history of atrial fibrillation on Sotalol who presented to the emergency  department for concern of weakness/fatigue and was found to have HR of 20.  On arrival patient with pulse in high 20s/low 30s.  Atropine and glucagon tried to no avail.  Cardiology consulted and recommending dopamine drip.  Dopamine initiated.  No indication for pacing at this time.  Cardiology will admit to ICU.  Patient protecting airway.  No evidence of infection.  Patient admitted.    Arloa Koh, MD 03/07/13 0040

## 2013-03-06 NOTE — H&P (Signed)
[name] is an 79 y.o. female.    Primary Cardiologist: Dr. Verdis Prime  Chief Complaint: Dizziness and weakness  HPI: 79 y/o female with a PMH of AFIB that has been rate-controlled with Sotalol 120 mg bid and anti-coagulated with Lovenox 100 mg SQ q daily presenting for evaluation of a 2 days history of progressive weakness and dizziness.  She reports that she has been on Sotalol for the last 10 years for AFIB rate control, but denies any recent changes in the dosage of Sotalol.  She has been doing fine up until 2 days prior to presentation when her symptoms of weakness and dizziness started. She denies any chest pain, shortness of breath or frank syncope. She was seen in the clinic where she was subsequently referred to ER for further evaluation. In ER, her EKG obtained 03/06/2013 at 18:39 showed junctional bradycardia with heart rate 29 bpm and non-specific T-wave abnormalities (no ST-elevation on the EKG), and she was found to be hypotensive with SBP in the 90 mmHg range.  Her heart rate in the ER was noted to be ranging from 26 bpm to 41 bpm.  At the time I saw the patient, she was asymptomatic with heart rate ranging between 30-40 bpm with SBP 120-130 mmHg. She has Zoll pads to her chest wall.  First set of cardiac marker is negative.  Past Medical History  Diagnosis Date  . Shingles     zoster 1990 on the back and on the abdomen in 2007,facial zoster 2006  . Heart disease   . Hypertension   . Hypercholesterolemia   . A-fib   . Diabetes mellitus without complication   . Glaucoma   . Pulmonary embolism     3  . Acid reflux   . Arthritis   . Anxiety   . Migraine   . Facial pain   . Postherpetic trigeminal neuralgia 10/20/2012  . Unspecified cerebral artery occlusion with cerebral infarction   . Memory loss     Past Surgical History  Procedure Laterality Date  . Blood clots  06/2006    lower abdomen  . Pulmonary embolism surgery  03/2005  . Phlebitis  12/2005  .  Cholecystectomy  01/1983  . Biopsies of breast Bilateral 1971  . Migraine speech impairment  07/1963  . Hemorrhoid surgery  1966  . Appendectomy  1945  . Tonsillectomy      1954  . Gallbladder surgery  1980  . Insertion of vena cova filter  2007  . Leg / ankle soft tissue biopsy Right     precancerous    Family History  Problem Relation Age of Onset  . Diabetes Mother   . Hypertension Mother   . Hypertension Father   . Heart attack Brother     2 half brothers  . Glaucoma Other   . Glaucoma Other    Social History:  reports that she has never smoked. She has never used smokeless tobacco. She reports that she drinks alcohol. She reports that she does not use illicit drugs.  Allergies:  Allergies  Allergen Reactions  . Ambien [Zolpidem Tartrate]     A high, hives  . Benadryl [Diphenhydramine Hcl]     hives  . Betadine Antibiotic-Moisturize [Bacitracin-Polymyxin B]   . Biaxin [Clarithromycin]     hives  . Celebrex [Celecoxib]   . Ciprofloxacin Hcl     rash  . Combigan [Brimonidine Tartrate-Timolol]   . Coumadin [Warfarin Sodium]     rash  .  Coumarin   . Eye Drops [Tetrahydrazoline Hcl]     Burning, itching, swelling  . Flu Virus Vaccine     Short of breath  . Ivp Dye [Iodinated Diagnostic Agents]     hives  . Levaquin [Levofloxacin In D5w]     rash  . Naprosyn [Naproxen]     rash  . Pataday [Olopatadine Hcl]   . Penicillins     hives  . Plavix [Clopidogrel Bisulfate]     rash  . Septra [Sulfamethoxazole-Tmp Ds]     hives  . Shrimp [Shellfish Allergy]     anaphylaxis  . Voltaren [Diclofenac Sodium]     reflux  . Iohexol      Code: HIVES, Desc: PT IS ALLERGIC TO IVP DYE/IODINE/SHRIMP.  CAUSES SOB AND HIVES PER PT.  STEPHANIE, RT-R, Onset Date: [id number]      (Not in a hospital admission)  Results for orders placed during the hospital encounter of 03/06/13 (from the past 48 hour(s))  LACTIC ACID, PLASMA     Status: None   Collection Time    03/06/13   6:40 PM      Result Value Range   Lactic Acid, Venous 1.5  0.5 - 2.2 mmol/L  CBC WITH DIFFERENTIAL     Status: Abnormal   Collection Time    03/06/13  6:41 PM      Result Value Range   WBC 6.1  4.0 - 10.5 K/uL   RBC 3.60 (*) 3.87 - 5.11 MIL/uL   Hemoglobin 10.2 (*) 12.0 - 15.0 g/dL   HCT 54.0 (*) 98.1 - 19.1 %   MCV 84.2  78.0 - 100.0 fL   MCH 28.3  26.0 - 34.0 pg   MCHC 33.7  30.0 - 36.0 g/dL   RDW 47.8 (*) 29.5 - 62.1 %   Platelets 146 (*) 150 - 400 K/uL   Comment: PLATELET COUNT CONFIRMED BY SMEAR   Neutrophils Relative % 62  43 - 77 %   Neutro Abs 3.7  1.7 - 7.7 K/uL   Lymphocytes Relative 18  12 - 46 %   Lymphs Abs 1.1  0.7 - 4.0 K/uL   Monocytes Relative 16 (*) 3 - 12 %   Monocytes Absolute 0.9  0.1 - 1.0 K/uL   Eosinophils Relative 4  0 - 5 %   Eosinophils Absolute 0.3  0.0 - 0.7 K/uL   Basophils Relative 1  0 - 1 %   Basophils Absolute 0.1  0.0 - 0.1 K/uL  COMPREHENSIVE METABOLIC PANEL     Status: Abnormal   Collection Time    03/06/13  6:41 PM      Result Value Range   Sodium 128 (*) 135 - 145 mEq/L   Potassium 6.1 (*) 3.5 - 5.1 mEq/L   Chloride 97  96 - 112 mEq/L   CO2 22  19 - 32 mEq/L   Glucose, Bld 112 (*) 70 - 99 mg/dL   BUN 20  6 - 23 mg/dL   Creatinine, Ser 3.08 (*) 0.50 - 1.10 mg/dL   Calcium 8.8  8.4 - 65.7 mg/dL   Total Protein 7.1  6.0 - 8.3 g/dL   Albumin 4.1  3.5 - 5.2 g/dL   AST 14  0 - 37 U/L   ALT 7  0 - 35 U/L   Alkaline Phosphatase 95  39 - 117 U/L   Total Bilirubin 0.2 (*) 0.3 - 1.2 mg/dL   GFR calc non Af Amer 34 (*) >90 mL/min  GFR calc Af Amer 39 (*) >90 mL/min   Comment: (NOTE)     The eGFR has been calculated using the CKD EPI equation.     This calculation has not been validated in all clinical situations.     eGFR's persistently <90 mL/min signify possible Chronic Kidney     Disease.  TROPONIN I     Status: None   Collection Time    03/06/13  6:41 PM      Result Value Range   Troponin I <0.30  <0.30 ng/mL   Comment:             Due to the release kinetics of cTnI,     a negative result within the first hours     of the onset of symptoms does not rule out     myocardial infarction with certainty.     If myocardial infarction is still suspected,     repeat the test at appropriate intervals.  BASIC METABOLIC PANEL     Status: Abnormal   Collection Time    03/06/13  9:03 PM      Result Value Range   Sodium 128 (*) 135 - 145 mEq/L   Potassium 5.3 (*) 3.5 - 5.1 mEq/L   Chloride 99  96 - 112 mEq/L   CO2 21  19 - 32 mEq/L   Glucose, Bld 137 (*) 70 - 99 mg/dL   BUN 22  6 - 23 mg/dL   Creatinine, Ser 1.61 (*) 0.50 - 1.10 mg/dL   Calcium 8.2 (*) 8.4 - 10.5 mg/dL   GFR calc non Af Amer 35 (*) >90 mL/min   GFR calc Af Amer 40 (*) >90 mL/min   Comment: (NOTE)     The eGFR has been calculated using the CKD EPI equation.     This calculation has not been validated in all clinical situations.     eGFR's persistently <90 mL/min signify possible Chronic Kidney     Disease.  POCT I-STAT, CHEM 8     Status: Abnormal   Collection Time    03/06/13  9:07 PM      Result Value Range   Sodium 130 (*) 135 - 145 mEq/L   Potassium 4.7  3.5 - 5.1 mEq/L   Chloride 99  96 - 112 mEq/L   BUN 24 (*) 6 - 23 mg/dL   Creatinine, Ser 0.96 (*) 0.50 - 1.10 mg/dL   Glucose, Bld 045 (*) 70 - 99 mg/dL   Calcium, Ion 4.09  8.11 - 1.30 mmol/L   TCO2 22  0 - 100 mmol/L   Hemoglobin 8.5 (*) 12.0 - 15.0 g/dL   HCT 91.4 (*) 78.2 - 95.6 %   Dg Chest Port 1 View  03/06/2013   CLINICAL DATA:  Bradycardia.  EXAM: PORTABLE CHEST - 1 VIEW  COMPARISON:  06/05/2010.  FINDINGS: The heart is enlarged. There is moderate vascular congestion. Monitoring devices overlie the chest obscuring parts of the lung fields. There are no obvious focal infiltrates or CHF. Osseous demineralization is suspected. Similar appearance to priors.  IMPRESSION: Cardiomegaly with mild vascular congestion. No definite active infiltrates.   Electronically Signed   By: Davonna Belling M.D.   On: 03/06/2013 19:29    Review of Systems  Constitutional: Negative for fever, chills, weight loss, malaise/fatigue and diaphoresis.  HENT: Negative for ear discharge, ear pain and tinnitus.   Eyes: Negative for blurred vision, photophobia and discharge.  Respiratory: Negative for cough and sputum production.  Cardiovascular: Negative for chest pain, orthopnea and leg swelling.  Gastrointestinal: Negative for heartburn, nausea, abdominal pain and constipation.  Genitourinary: Negative for dysuria, urgency and hematuria.  Musculoskeletal: Negative for neck pain.  Skin: Negative for itching and rash.  Neurological: Positive for dizziness and weakness. Negative for tingling, tremors, sensory change, speech change, focal weakness, seizures and headaches.  Endo/Heme/Allergies: Negative for environmental allergies.  Psychiatric/Behavioral: Negative for depression, suicidal ideas and substance abuse.    Blood pressure 97/51, pulse 41, temperature 97.3 F (36.3 C), temperature source Oral, resp. rate 16, height 5\' 5"  (1.651 m), weight 107.049 kg (236 lb), SpO2 99.00%. Physical Exam  Constitutional: She is oriented to person, place, and time. She appears well-developed and well-nourished. No distress.  HENT:  Head: Normocephalic and atraumatic.  Eyes: Conjunctivae are normal. Right eye exhibits no discharge. Left eye exhibits no discharge. No scleral icterus.  Neck: Normal range of motion. Neck supple. No JVD present. No tracheal deviation present. No thyromegaly present.  Cardiovascular: Normal heart sounds.  Exam reveals no gallop and no friction rub.   No murmur heard. Respiratory: Effort normal and breath sounds normal. No stridor. No respiratory distress. She has no wheezes. She has no rales.  GI: Soft. Bowel sounds are normal. She exhibits no distension. There is no tenderness. There is no rebound and no guarding.  Musculoskeletal: She exhibits no edema and no tenderness.   Neurological: She is alert and oriented to person, place, and time.  Skin: No rash noted. She is not diaphoretic. No erythema.  Psychiatric: She has a normal mood and affect.     Assessment/Plan  Junctional Bradycardia: likely iatrogenic from use of Sotalol 120 mg bid that she has been using for AFIB rate-control over the last 10 years. It appears that the current dose of Sotalol is too high for her.  I will admit the patient to cardiac ICU to observed on telemetry.  We will discontinue Sotalol tonight while we are observing her on telemetry and start low-dose Dopamine drip increase heart rate and blood pressure.  If she regains sinus rhythm and becomes tachycardic, we will wean off Dopamine and consider restarting Sotalol at a lower dose.  We will keep Zoll pads in place in case we need to pace her subcutaneously.  If her heart rate does not improve with discontinuation of Sotalol and/or she becomes symptomatic, we may consider transvenous or permanent pacing. We will obtain TTE to evaluate her LV function and obtain serial cardiac markers.    Jarold Macomber E 03/06/2013, 10:50 PM

## 2013-03-07 ENCOUNTER — Encounter (HOSPITAL_COMMUNITY): Payer: Self-pay | Admitting: Cardiology

## 2013-03-07 DIAGNOSIS — I480 Paroxysmal atrial fibrillation: Secondary | ICD-10-CM | POA: Diagnosis present

## 2013-03-07 DIAGNOSIS — I4891 Unspecified atrial fibrillation: Secondary | ICD-10-CM

## 2013-03-07 DIAGNOSIS — E039 Hypothyroidism, unspecified: Secondary | ICD-10-CM | POA: Diagnosis present

## 2013-03-07 DIAGNOSIS — E669 Obesity, unspecified: Secondary | ICD-10-CM | POA: Diagnosis present

## 2013-03-07 DIAGNOSIS — E119 Type 2 diabetes mellitus without complications: Secondary | ICD-10-CM

## 2013-03-07 DIAGNOSIS — Z86711 Personal history of pulmonary embolism: Secondary | ICD-10-CM

## 2013-03-07 DIAGNOSIS — R42 Dizziness and giddiness: Secondary | ICD-10-CM | POA: Diagnosis present

## 2013-03-07 DIAGNOSIS — I498 Other specified cardiac arrhythmias: Secondary | ICD-10-CM

## 2013-03-07 DIAGNOSIS — Z7901 Long term (current) use of anticoagulants: Secondary | ICD-10-CM

## 2013-03-07 DIAGNOSIS — R110 Nausea: Secondary | ICD-10-CM

## 2013-03-07 DIAGNOSIS — E875 Hyperkalemia: Secondary | ICD-10-CM

## 2013-03-07 HISTORY — DX: Long term (current) use of anticoagulants: Z79.01

## 2013-03-07 LAB — URINALYSIS, ROUTINE W REFLEX MICROSCOPIC
Glucose, UA: NEGATIVE mg/dL
Hgb urine dipstick: NEGATIVE
Leukocytes, UA: NEGATIVE
Specific Gravity, Urine: 1.004 — ABNORMAL LOW (ref 1.005–1.030)
Urobilinogen, UA: 0.2 mg/dL (ref 0.0–1.0)
pH: 5.5 (ref 5.0–8.0)

## 2013-03-07 LAB — BASIC METABOLIC PANEL
BUN: 20 mg/dL (ref 6–23)
CO2: 21 mEq/L (ref 19–32)
Calcium: 9 mg/dL (ref 8.4–10.5)
Calcium: 8.6 mg/dL (ref 8.4–10.5)
Chloride: 98 mEq/L (ref 96–112)
GFR calc Af Amer: 47 mL/min — ABNORMAL LOW (ref >90)
GFR calc non Af Amer: 39 mL/min — ABNORMAL LOW (ref >90)
GFR calc non Af Amer: 40 mL/min — ABNORMAL LOW (ref >90)
Potassium: 5.6 mEq/L — ABNORMAL HIGH (ref 3.5–5.1)
Potassium: 5.7 mEq/L — ABNORMAL HIGH (ref 3.5–5.1)
Sodium: 128 mEq/L — ABNORMAL LOW (ref 135–145)
Sodium: 128 mEq/L — ABNORMAL LOW (ref 135–145)

## 2013-03-07 LAB — T4, FREE: Free T4: 0.83 ng/dL (ref 0.80–1.80)

## 2013-03-07 LAB — TROPONIN I
Troponin I: <0.3 ng/mL (ref <0.30)
Troponin I: <0.3 ng/mL (ref <0.30)

## 2013-03-07 LAB — MRSA PCR SCREENING: MRSA by PCR: NEGATIVE

## 2013-03-07 LAB — LIPID PANEL
Cholesterol: 167 mg/dL (ref 0–200)
LDL Cholesterol: 75 mg/dL (ref 0–99)
Total CHOL/HDL Ratio: 2.1 RATIO
VLDL: 13 mg/dL (ref 0–40)

## 2013-03-07 LAB — GLUCOSE, CAPILLARY

## 2013-03-07 LAB — CBC
MCH: 28.6 pg (ref 26.0–34.0)
MCHC: 34.6 g/dL (ref 30.0–36.0)
Platelets: 127 10*3/uL — ABNORMAL LOW (ref 150–400)
RBC: 3.81 MIL/uL — ABNORMAL LOW (ref 3.87–5.11)
WBC: 10 10*3/uL (ref 4.0–10.5)

## 2013-03-07 MED ORDER — FOLIC ACID 1 MG PO TABS
1.0000 mg | ORAL_TABLET | Freq: Every morning | ORAL | Status: DC
  Administered 2013-03-07 – 2013-03-09 (×3): 1 mg via ORAL
  Filled 2013-03-07 (×3): qty 1

## 2013-03-07 MED ORDER — ONDANSETRON HCL 4 MG/2ML IJ SOLN
4.0000 mg | Freq: Four times a day (QID) | INTRAMUSCULAR | Status: DC | PRN
  Administered 2013-03-07 (×2): 4 mg via INTRAVENOUS
  Filled 2013-03-07 (×2): qty 2

## 2013-03-07 MED ORDER — ONDANSETRON HCL 4 MG/2ML IJ SOLN
4.0000 mg | Freq: Once | INTRAMUSCULAR | Status: AC
  Administered 2013-03-07: 4 mg via INTRAVENOUS

## 2013-03-07 MED ORDER — PANTOPRAZOLE SODIUM 40 MG PO TBEC
40.0000 mg | DELAYED_RELEASE_TABLET | Freq: Every day | ORAL | Status: DC
  Administered 2013-03-07 – 2013-03-09 (×3): 40 mg via ORAL
  Filled 2013-03-07 (×3): qty 1

## 2013-03-07 MED ORDER — ACETAMINOPHEN 325 MG PO TABS: 650.0000 mg | ORAL_TABLET | ORAL | Status: DC | PRN

## 2013-03-07 MED ORDER — SODIUM CHLORIDE 0.9 % IV SOLN
INTRAVENOUS | Status: DC
  Administered 2013-03-07: 75 mL/h via INTRAVENOUS

## 2013-03-07 MED ORDER — LEVOTHYROXINE SODIUM 75 MCG PO TABS
75.0000 ug | ORAL_TABLET | Freq: Every day | ORAL | Status: DC
  Administered 2013-03-07 – 2013-03-09 (×3): 75 ug via ORAL
  Filled 2013-03-07 (×4): qty 1

## 2013-03-07 MED ORDER — HYDROCODONE-ACETAMINOPHEN 5-325 MG PO TABS: 1.0000 | ORAL_TABLET | Freq: Four times a day (QID) | ORAL | Status: DC | PRN

## 2013-03-07 MED ORDER — ONDANSETRON HCL 4 MG/2ML IJ SOLN
INTRAMUSCULAR | Status: AC
  Filled 2013-03-07: qty 2

## 2013-03-07 MED ORDER — GABAPENTIN 300 MG PO CAPS
300.0000 mg | ORAL_CAPSULE | Freq: Every day | ORAL | Status: DC
  Administered 2013-03-07 – 2013-03-09 (×3): 300 mg via ORAL
  Filled 2013-03-07 (×3): qty 1

## 2013-03-07 MED ORDER — SIMVASTATIN 20 MG PO TABS
20.0000 mg | ORAL_TABLET | Freq: Every day | ORAL | Status: DC
  Administered 2013-03-07 – 2013-03-08 (×2): 20 mg via ORAL
  Filled 2013-03-07 (×3): qty 1

## 2013-03-07 MED ORDER — LATANOPROST 0.005 % OP SOLN
1.0000 [drp] | Freq: Every day | OPHTHALMIC | Status: DC
  Administered 2013-03-07 – 2013-03-08 (×2): 1 [drp] via OPHTHALMIC
  Filled 2013-03-07: qty 2.5

## 2013-03-07 NOTE — Progress Notes (Signed)
When trying to wean Dopamine, heart rate down into 30's.  Hopefully needs further wash out of medication.  Will make NPO in case pacemaker is needed.

## 2013-03-07 NOTE — Progress Notes (Signed)
Dr Katha Cabal notified at 0507 of am labs K=5.6, pt with bouts of nausea, Dopamine at 6 mcg, with heart rate variable 40-60's with Sinus on monitor. No new orders at this time, will continue to monitor closely. Dustin Flock RN

## 2013-03-07 NOTE — Progress Notes (Signed)
Subjective:  No new complaints. Mild nausea through night. Zofran.   Dopamine at . Zoll pads.   Potassium 5.6 this am.   Objective:  Vital Signs in the last 24 hours: Temp:  [97.3 F (36.3 C)-98.5 F (36.9 C)] 98.5 F (36.9 C) (12/14 0415) Pulse Rate:  [26-126] 65 (12/14 0800) Resp:  [10-31] 14 (12/14 0800) BP: (86-177)/(36-134) 153/54 mmHg (12/14 0800) SpO2:  [87 %-100 %] 96 % (12/14 0800) Weight:  [234 lb 5.6 oz (106.3 kg)-236 lb (107.049 kg)] 234 lb 5.6 oz (106.3 kg) (12/14 0045)  Intake/Output from previous day: 12/13 0701 - 12/14 0700 In: 1200.6 [I.V.:1200.6] Out: 300 [Urine:300]   Physical Exam: General: Well developed, well nourished, in no acute distress. Head:  Normocephalic and atraumatic. Lungs: Clear to auscultation and percussion. Normal effort.  Heart: Huston Foley regular.  No murmur, rubs or gallops.  Abdomen: soft, non-tender, positive bowel sounds. Obese Extremities: No clubbing or cyanosis. No edema. Neurologic: Alert and oriented x 3.    Lab Results:  Recent Labs  03/06/13 1841 03/06/13 2107 03/07/13 0410  WBC 6.1  --  10.0  HGB 10.2* 8.5* 10.9*  PLT 146*  --  127*    Recent Labs  03/06/13 2103 03/06/13 2107 03/07/13 0410  NA 128* 130* 128*  K 5.3* 4.7 5.6*  CL 99 99 96  CO2 21  --  21  GLUCOSE 137* 133* 124*  BUN 22 24* 22  CREATININE 1.38* 1.40* 1.26*    Recent Labs  03/06/13 2249 03/07/13 0410  TROPONINI <0.30 <0.30   Hepatic Function Panel  Recent Labs  03/06/13 1841  PROT 7.1  ALBUMIN 4.1  AST 14  ALT 7  ALKPHOS 95  BILITOT 0.2*    Recent Labs  03/07/13 0410  CHOL 167   No results found for this basename: PROTIME,  in the last 72 hours  Imaging: Dg Chest Port 1 View  03/06/2013   CLINICAL DATA:  Bradycardia.  EXAM: PORTABLE CHEST - 1 VIEW  COMPARISON:  06/05/2010.  FINDINGS: The heart is enlarged. There is moderate vascular congestion. Monitoring devices overlie the chest obscuring parts of the lung  fields. There are no obvious focal infiltrates or CHF. Osseous demineralization is suspected. Similar appearance to priors.  IMPRESSION: Cardiomegaly with mild vascular congestion. No definite active infiltrates.   Electronically Signed   By: Davonna Belling M.D.   On: 03/06/2013 19:29   Personally viewed.   Telemetry: NSR, occasional pause 2-3 sec.  Personally viewed.   EKG:  12/13 at 1839 - Sinus brady 29bpm. Repeat 12/14 at 0651 NSR 69.   Cardiac Studies:  ECHO pending. In 2008 normal EF.   Assessment/Plan:  Active Problems:   Bradycardia  1) Severe bradycardia - stopped Sotalol 120mg  PO BID. Improved this am. She is on Dopamine currently. Will wean off through morning/day. If she has issues with pauses, brady in future, pacer. Understands that AFIB with RVR may return. If medications for rate control or rhythm control needed may need pacer. Zoll pads for protection.   2) Mild hyperkalemia - will repeat this afternoon. 5.6 currently.   3) Hyponatremia - 128 currently, was 132 on 10/23/12. Chronic appearing. With hyponatremia, mild hyperkalemia, nausea, bradycardia ?adrenal insufficiency. BP remains stable. Will give gentle IVF 75mg / hr. X 24 hr. I will check am cortisol.   4) PAF - takes lovenox 100mg  QD at home. Continue. Has not had clinical reoccurance in quite some time. Stopped sotalol as above.  5) Chronic anticoag - lovenox.   6) Nausea - monitor. No fever, abd is soft. Check am cortisol.   7) Obesity - weight loss  8) Prior DVT/PE - IVC filter in place. This is important if temp pacer needed. Right IJ approach needed.   9) Hypertension - stable.   10) Chronic kidney disease stage 3. - holding ARB.   11) Hypothroidism - synthroid. Will check TSH and free T4 with brady.   12) DM - stable.   Complex medical care.   Hondo Nanda 03/07/2013, 8:55 AM

## 2013-03-08 ENCOUNTER — Encounter (HOSPITAL_COMMUNITY): Payer: Self-pay | Admitting: Internal Medicine

## 2013-03-08 ENCOUNTER — Ambulatory Visit: Payer: Medicare Other | Admitting: Podiatry

## 2013-03-08 DIAGNOSIS — I498 Other specified cardiac arrhythmias: Secondary | ICD-10-CM | POA: Diagnosis not present

## 2013-03-08 DIAGNOSIS — I359 Nonrheumatic aortic valve disorder, unspecified: Secondary | ICD-10-CM | POA: Diagnosis not present

## 2013-03-08 LAB — BASIC METABOLIC PANEL
CO2: 22 mEq/L (ref 19–32)
Chloride: 103 mEq/L (ref 96–112)
GFR calc Af Amer: 50 mL/min — ABNORMAL LOW (ref >90)
GFR calc non Af Amer: 43 mL/min — ABNORMAL LOW (ref >90)
Glucose, Bld: 113 mg/dL — ABNORMAL HIGH (ref 70–99)
Potassium: 5 mEq/L (ref 3.5–5.1)
Sodium: 133 mEq/L — ABNORMAL LOW (ref 135–145)

## 2013-03-08 LAB — CORTISOL-AM, BLOOD: Cortisol - AM: 13.3 ug/dL (ref 4.3–22.4)

## 2013-03-08 MED ORDER — ENOXAPARIN SODIUM 100 MG/ML ~~LOC~~ SOLN
100.0000 mg | Freq: Every day | SUBCUTANEOUS | Status: DC
  Administered 2013-03-08: 100 mg via SUBCUTANEOUS
  Filled 2013-03-08 (×2): qty 1

## 2013-03-08 MED ORDER — LORATADINE 10 MG PO TABS
10.0000 mg | ORAL_TABLET | Freq: Every day | ORAL | Status: DC
Start: 2013-03-08 — End: 2013-03-09
  Administered 2013-03-08 – 2013-03-09 (×2): 10 mg via ORAL
  Filled 2013-03-08 (×2): qty 1

## 2013-03-08 MED ORDER — LOPERAMIDE HCL 2 MG PO CAPS
4.0000 mg | ORAL_CAPSULE | ORAL | Status: DC | PRN
  Administered 2013-03-08: 4 mg via ORAL
  Filled 2013-03-08: qty 2

## 2013-03-08 MED ORDER — GABAPENTIN 300 MG PO CAPS
900.0000 mg | ORAL_CAPSULE | Freq: Every day | ORAL | Status: DC
Start: 2013-03-08 — End: 2013-03-09
  Administered 2013-03-08: 900 mg via ORAL
  Filled 2013-03-08 (×2): qty 3

## 2013-03-08 NOTE — Progress Notes (Signed)
  Echocardiogram 2D Echocardiogram has been performed.  Kristan Brummitt, [facility] 03/08/2013, 10:17 AM

## 2013-03-08 NOTE — Consult Note (Signed)
ELECTROPHYSIOLOGY CONSULT NOTE  Patient ID: [name] MRN: [id number], DOB/AGE: Nov 20, 1929   Admit date: 03/06/2013 Date of Consult: 03/08/2013  Primary Physician: Cala Bradford, MD Primary Cardiologist: Verdis Prime, MD Reason for Consultation: Bradycardia  History of Present Illness [name] is a 79 y.o. female with PAF, HTN, dyslipidemia, DM, CKD and PE s/p IVC filter who was admitted over the weekend with dizziness and weakness / 2 episodes of near syncope, found to have junctional bradycardia requiring dopamine infusion. K 6.1 on admission. Serial troponin negative. TSH normal. Echo pending. She has taken Sotalol for years and this was discontinued on admission. She remains in SR on dopamine. Today the plan is to wean dopamine and follow heart rate and rhythm. EP has been asked to see for possible PPM.  Past Medical History Past Medical History  Diagnosis Date  . Shingles     zoster 1990 on the back and on the abdomen in 2007,facial zoster 2006  . Heart disease   . Hypertension   . Hypercholesterolemia   . A-fib   . Diabetes mellitus without complication   . Glaucoma   . Pulmonary embolism     3  . Acid reflux   . Arthritis   . Anxiety   . Migraine   . Facial pain   . Postherpetic trigeminal neuralgia 10/20/2012  . Unspecified cerebral artery occlusion with cerebral infarction   . Memory loss   . PAF (paroxysmal atrial fibrillation) 03/07/2013    Was on sotalol since 2003  . Chronic anticoagulation 03/07/2013    Lovenox 100 QD  . History of pulmonary embolus (PE) 03/07/2013    IVC filter    Past Surgical History Past Surgical History  Procedure Laterality Date  . Blood clots  06/2006    lower abdomen  . Pulmonary embolism surgery  03/2005  . Phlebitis  12/2005  . Cholecystectomy  01/1983  . Biopsies of breast Bilateral 1971  . Migraine speech impairment  07/1963  . Hemorrhoid surgery  1966  . Appendectomy  1945  . Tonsillectomy      1954  .  Gallbladder surgery  1980  . Insertion of vena cova filter  2007  . Leg / ankle soft tissue biopsy Right     precancerous    Allergies/Intolerances Allergies  Allergen Reactions  . Ambien [Zolpidem Tartrate]     A high, hives  . Benadryl [Diphenhydramine Hcl]     hives  . Betadine Antibiotic-Moisturize [Bacitracin-Polymyxin B]   . Biaxin [Clarithromycin]     hives  . Celebrex [Celecoxib]   . Ciprofloxacin Hcl     rash  . Combigan [Brimonidine Tartrate-Timolol]   . Coumadin [Warfarin Sodium]     rash  . Coumarin   . Eye Drops [Tetrahydrazoline Hcl]     Burning, itching, swelling  . Flu Virus Vaccine     Short of breath  . Ivp Dye [Iodinated Diagnostic Agents]     hives  . Levaquin [Levofloxacin In D5w]     rash  . Naprosyn [Naproxen]     rash  . Pataday [Olopatadine Hcl]   . Penicillins     hives  . Plavix [Clopidogrel Bisulfate]     rash  . Septra [Sulfamethoxazole-Tmp Ds]     hives  . Shrimp [Shellfish Allergy]     anaphylaxis  . Voltaren [Diclofenac Sodium]     reflux  . Gluten Meal Diarrhea  . Iohexol      Code: HIVES, Desc: PT IS  ALLERGIC TO IVP DYE/IODINE/SHRIMP.  CAUSES SOB AND HIVES PER PT.  STEPHANIE, RT-R, Onset Date: [id number]     Inpatient Medications . folic acid  1 mg Oral q morning - 10a  . gabapentin  300 mg Oral Daily  . latanoprost  1 drop Both Eyes QHS  . levothyroxine  75 mcg Oral QAC breakfast  . pantoprazole  40 mg Oral Daily  . simvastatin  20 mg Oral q1800    Family History Family History  Problem Relation Age of Onset  . Diabetes Mother   . Hypertension Mother   . Hypertension Father   . Heart attack Brother     2 half brothers  . Glaucoma Other   . Glaucoma Other      Social History History   Social History  . Marital Status: Divorced    Spouse Name: N/A    Number of Children: 3  . Years of Education: 14   Occupational History  . retired     Estate agent   Social History Main Topics  . Smoking  status: Never Smoker   . Smokeless tobacco: Never Used  . Alcohol Use: Yes     Comment: very moderate, none  in the last two months  . Drug Use: No  . Sexual Activity: Not on file   Other Topics Concern  . Not on file   Social History Narrative   Patient is retired and lives at home alone. Patient has a college education.      Review of Systems General: No chills, fever, night sweats or weight changes  Cardiovascular:  No chest pain, dyspnea on exertion, edema, orthopnea, palpitations, paroxysmal nocturnal dyspnea Dermatological: No rash, lesions or masses Respiratory: No cough, dyspnea Urologic: No hematuria, dysuria Abdominal: No nausea, vomiting, diarrhea, bright red blood per rectum, melena, or hematemesis Neurologic: No visual changes, weakness, changes in mental status All other systems reviewed and are otherwise negative except as noted above.  Physical Exam Vitals: Blood pressure 108/60, pulse 63, temperature 99.1 F (37.3 C), temperature source Oral, resp. rate 20, height 5\' 5"  (1.651 m), weight 229 lb 11.5 oz (104.2 kg), SpO2 98.00%. General: Well developed, well appearing 79 y.o. female in no acute distress. HEENT: Normocephalic, atraumatic. EOMs intact. Sclera nonicteric. Oropharynx clear.  Neck: Supple. No JVD. Lungs: Respirations regular and unlabored, CTA bilaterally. No wheezes, rales or rhonchi. Heart: RRR. S1, S2 present. No murmurs, rub, S3 or S4. Abdomen: Soft, non-tender, non-distended. BS present x 4 quadrants. No hepatosplenomegaly.  Extremities: No clubbing, cyanosis or edema. DP/PT/Radials 2+ and equal bilaterally. Psych: Normal affect. Neuro: Alert and oriented X 3. Moves all extremities spontaneously. Musculoskeletal: No kyphosis. Skin: Intact. Warm and dry. No rashes or petechiae in exposed areas.   Labs  Recent Labs  03/06/13 1841 03/06/13 2249 03/07/13 0410 03/07/13 1136  TROPONINI <0.30 <0.30 <0.30 <0.30   Lab Results  Component Value  Date   WBC 10.0 03/07/2013   HGB 10.9* 03/07/2013   HCT 31.5* 03/07/2013   MCV 82.7 03/07/2013   PLT 127* 03/07/2013    Recent Labs Lab 03/06/13 1841  03/08/13 0430  NA 128*  < > 133*  K 6.1*  < > 5.0  CL 97  < > 103  CO2 22  < > 22  BUN 20  < > 18  CREATININE 1.41*  < > 1.16*  CALCIUM 8.8  < > 8.4  PROT 7.1  --   --   BILITOT 0.2*  --   --  ALKPHOS 95  --   --   ALT 7  --   --   AST 14  --   --   GLUCOSE 112*  < > 113*  < > = values in this interval not displayed.   Recent Labs  03/06/13 2249  TSH 1.012    Radiology/Studies Dg Chest Port 1 View 03/06/2013     IMPRESSION: Cardiomegaly with mild vascular congestion. No definite active infiltrates.    Electronically Signed   By: Davonna Belling M.D.   On: 03/06/2013 19:29   Echocardiogram  Pending  12-lead ECG - on admission - junctional brady at 30 bpm; QRS duration 91 msec  Telemetry - SR in 60s currently  Assessment and Plan 1. Junctional bradycardia - symptomatic with weakness and dizziness / near syncope - sotalol discontinued 03/06/2013 - echo pending - agree with weaning dopamine today and if recurrent bradycardia will need permanent pacing; will await echo to assess LV function prior to device implant  2. PAF - currently in SR 3. History of recurrent DVT/PE - s/p IVC filter - on Lovenox daily as outpatient long term (allergy to Coumadin) 4. Chronic anemia - followed by Dr. Arbutus Ped  Dr. Graciela Husbands to see Signed, Rick Duff, PA-C 03/08/2013, 9:31 AM   Pt has resumed sinus with wash out of sotalol;  Would expect progressive problems with bradycardia over time and recurrent afib One strategy that HS ventured this am was to wait and see, and i think this is eminently reasonable esp as she is on anticoagulation and her LA dimension is 4.2/2 Would consider changing to NOAC from heparin. Does she have defined hypercoagulable disorder? Genetic implications

## 2013-03-08 NOTE — Progress Notes (Addendum)
HR stable at 60 bpm in NSR. Plan to continue NPO. Wean and d/c dopamine. If recurrent bradycardia, will get pacer. If no recurrent brady, will observe and feed. D/C lovenox in anticipation of PPM.

## 2013-03-08 NOTE — Progress Notes (Signed)
Chaplain offered emotional and spiritual support to patient and her daughter, Darl Pikes. Pt expressed feeling anxious over pacemaker procedure and anxious to know plan for post-procedure. Pt said she is eager to go home for Christmas. Chaplain prayed with patient and her daughter and provided emotional and spiritual support, and empathic listening. Both were grateful for support.   Maurene Capes, [place] [phone]

## 2013-03-08 NOTE — Progress Notes (Signed)
Patient ID: [name], female   DOB: 1929-07-30, 79 y.o.   MRN: [id number]       Patient Name: [name] Date of Encounter: 03/08/2013    SUBJECTIVE: After seeing the patient early this a.m., she has successfully weaned off of dopamine and has not had bradycardia  TELEMETRY:  Sinus rhythm in the 60s: Filed Vitals:   03/08/13 0800 03/08/13 0900 03/08/13 1000 03/08/13 1208  BP: 108/60 142/54 129/46   Pulse: 63 75 68   Temp:    98.1 F (36.7 C)  TempSrc:    Oral  Resp: 20 17 20    Height:      Weight:      SpO2: 98% 96% 96%     Intake/Output Summary (Last 24 hours) at 03/08/13 1324 Last data filed at 03/08/13 1000  Gross per 24 hour  Intake   1868 ml  Output   1150 ml  Net    718 ml    LABS: Basic Metabolic Panel:  Recent Labs  21/30/86 2107 03/06/13 2249  03/07/13 1402 03/08/13 0430  NA 130*  --   < > 128* 133*  K 4.7  --   < > 5.7* 5.0  CL 99  --   < > 98 103  CO2  --   --   < > 21 22  GLUCOSE 133*  --   < > 121* 113*  BUN 24*  --   < > 20 18  CREATININE 1.40*  --   < > 1.22* 1.16*  CALCIUM  --   --   < > 8.6 8.4  MG  --  1.9  --   --   --   < > = values in this interval not displayed. CBC:  Recent Labs  03/06/13 1841 03/06/13 2107 03/07/13 0410  WBC 6.1  --  10.0  NEUTROABS 3.7  --   --   HGB 10.2* 8.5* 10.9*  HCT 30.3* 25.0* 31.5*  MCV 84.2  --  82.7  PLT 146*  --  127*   Cardiac Enzymes:  Recent Labs  03/06/13 2249 03/07/13 0410 03/07/13 1136  TROPONINI <0.30 <0.30 <0.30   BNP: No components found with this basename: POCBNP,  Hemoglobin A1C: No results found for this basename: HGBA1C,  in the last 72 hours Fasting Lipid Panel:  Recent Labs  03/07/13 0410  CHOL 167  HDL 79  LDLCALC 75  TRIG 67  CHOLHDL 2.1    Radiology/Studies:  No new data  Physical Exam: Blood pressure 129/46, pulse 68, temperature 98.1 F (36.7 C), temperature source Oral, resp. rate 20, height 5\' 5"  (1.651 m), weight 229 lb 11.5 oz (104.2  kg), SpO2 96.00%. Weight change: -6 lb 4.5 oz (-2.849 kg)   No murmur or gallop  ASSESSMENT:  1. Profound bradycardia secondary to intrinsic tendency towards bradycardia and the heart rate slowing effects of sotalol. Bradycardia now resolved of sotalol. 2. History of paroxysmal atrial fibrillation  Plan:  1. Transferred to telemetry 2. Ambulate as tolerated 3. Will resume diet 4. Appreciate BP assessment and recommendations. 5. Will hold Lovenox for now  Signed, Lesleigh Noe 03/08/2013, 1:24 PM

## 2013-03-09 DIAGNOSIS — I498 Other specified cardiac arrhythmias: Secondary | ICD-10-CM | POA: Diagnosis not present

## 2013-03-09 DIAGNOSIS — Z7901 Long term (current) use of anticoagulants: Secondary | ICD-10-CM | POA: Diagnosis not present

## 2013-03-09 DIAGNOSIS — E875 Hyperkalemia: Secondary | ICD-10-CM | POA: Diagnosis not present

## 2013-03-09 DIAGNOSIS — I4891 Unspecified atrial fibrillation: Secondary | ICD-10-CM | POA: Diagnosis not present

## 2013-03-09 LAB — BASIC METABOLIC PANEL
BUN: 17 mg/dL (ref 6–23)
CO2: 20 mEq/L (ref 19–32)
Chloride: 105 mEq/L (ref 96–112)
GFR calc Af Amer: 47 mL/min — ABNORMAL LOW (ref >90)
Potassium: 4.4 mEq/L (ref 3.5–5.1)

## 2013-03-09 LAB — URINE CULTURE: Colony Count: 15000

## 2013-03-09 NOTE — Progress Notes (Signed)
Went over discharge instructions with the patient. No additional questions or concerns related tp discharge.  Daughter at bedside. IV d/C'd and patient taken off the monitor. Patient stable, discharged home with daughter. Stanton Kidney R

## 2013-03-09 NOTE — ED Provider Notes (Signed)
I saw and evaluated the patient, reviewed the resident's note and I agree with the findings and plan.  EKG Interpretation    Date/Time:  Saturday March 06 2013 18:39:28 EST Ventricular Rate:  29 PR Interval:    QRS Duration: 91 QT Interval:  525 QTC Calculation: 364 R Axis:   -7 Text Interpretation:  Junctional rhythm Left ventricular hypertrophy Nonspecific T abnormalities, inferior leads marked bradycardia compared to previous ECG Confirmed by [name]  MD, [name] ([id number]) on 03/06/2013 7:26:53 PM            Patient presents to the ER for evaluation of weakness and dizziness. Patient was brought to the emergency department by ambulance with a heart rate in the 20s. Patient is tolerating the bradycardia, mildly hypotensive but still awake and alert. Workup unremarkable. Patient did not respond to atropine or glucagon. She is on a beta blocker and it was felt that she might have overdosed. At the recommendation of cardiology, dopamine initiated and patient will be admitted.  [name], MD 03/09/13 1535

## 2013-03-09 NOTE — Discharge Summary (Signed)
Patient ID: TISHANNA DUNFORD MRN: [id number] DOB/AGE: 05/04/1929 79 y.o.  Admit date: 03/06/2013 Discharge date: 03/09/2013 Patient Active Problem List   Diagnosis Date Noted  . PAF (paroxysmal atrial fibrillation) 03/07/2013  . Chronic anticoagulation 03/07/2013  . Obesity, unspecified 03/07/2013  . History of pulmonary embolus (PE) 03/07/2013  . Diabetes 03/07/2013  . Hyperkalemia 03/07/2013  . Nausea 03/07/2013  . Hypothyroidism 03/07/2013  . Dizziness 03/07/2013  . Bradycardia 03/06/2013  . Dermatophytosis of nail 12/30/2012  . 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 Unspecified cerebral artery occlusion with cerebral infarction   . Unspecified deficiency anemia 10/21/2012  . Postherpetic trigeminal neuralgia 10/20/2012   Primary Discharge Diagnosis:  Profound bradycardia resolved off Sotalol 120 mg BID Secondary Discharge Diagnosis:  Tachy-brady syndrome.  Chronic anticoagulation. Hyperkalemia, resolved off Valsartan  Significant Diagnostic Studies: Echocardiogram  Consults: EP, Dr. Beverly Sessions Course: Admitted with weakness and found to have severe bradycardia on sotalol. Also noted hyperkalemia. The Sotalol and Valsartan were held and bradycardia resolved. EP felt that pacer was not indicated. She has the tachy Brady syndrome and if recurrent AFIB occurs will need pacer and antiarrhythmic therapy. For now, plan to keep it simple. Will need early BP check to assure BP is not excessive.   Discharge Exam: Blood pressure 135/48, pulse 69, temperature 97.2 F (36.2 C), temperature source Oral, resp. rate 18, height 5\' 5"  (1.651 m), weight 229 lb 11.5 oz (104.2 kg), SpO2 92.00%.  Unremarkable. Labs:   Lab Results  Component Value Date   WBC 10.0 03/07/2013   HGB 10.9* 03/07/2013   HCT 31.5* 03/07/2013   MCV 82.7 03/07/2013   PLT 127* 03/07/2013    Recent Labs Lab 03/06/13 1841  03/09/13 0355  NA 128*  < > 135  K 6.1*  < > 4.4  CL  97  < > 105  CO2 22  < > 20  BUN 20  < > 17  CREATININE 1.41*  < > 1.21*  CALCIUM 8.8  < > 8.2*  PROT 7.1  --   --   BILITOT 0.2*  --   --   ALKPHOS 95  --   --   ALT 7  --   --   AST 14  --   --   GLUCOSE 112*  < > 86  < > = values in this interval not displayed. Lab Results  Component Value Date   TROPONINI <0.30 03/07/2013    Lab Results  Component Value Date   CHOL 167 03/07/2013   Lab Results  Component Value Date   HDL 79 03/07/2013   Lab Results  Component Value Date   LDLCALC 75 03/07/2013   Lab Results  Component Value Date   TRIG 67 03/07/2013   Lab Results  Component Value Date   CHOLHDL 2.1 03/07/2013   No results found for this basename: LDLDIRECT      Radiology:  No new data  EKG: Sinus rhythm  FOLLOW UP PLANS AND APPOINTMENTS      Future Appointments Provider Department Dept Phone   04/07/2013 3:15 PM Lesleigh Noe, MD [facility] [phone]   04/22/2013 3:00 PM Nilda Riggs, NP [facility] [phone]       Medication List    STOP taking these medications       DIOVAN 320 MG tablet  Generic drug:  valsartan     sotalol 120 MG tablet  Commonly  known as:  BETAPACE      TAKE these medications       ACIPHEX 20 MG tablet  Generic drug:  RABEprazole  Take 20 mg by mouth every morning.     CARBATROL 100 MG 12 hr capsule  Generic drug:  carbamazepine  TAKE THREE CAPSULES BY MOUTH EVERY MORNING, TWO CAPSULES AT NOON, AND THREE CAPSULES AT BEDTIME. TAKE AFTER MEALS.     EPIPEN IJ  Inject as directed once as needed. For allergic reactions     EUCERIN SKIN CALMING 0.1 % Lotn  Generic drug:  Menthol  Apply 1 application topically daily as needed. For dry skin     folic acid 1 MG tablet  Commonly known as:  FOLVITE  Take 1 mg by mouth every morning.     gabapentin 300 MG capsule  Commonly known as:  NEURONTIN  Take 900 mg by mouth at bedtime.     HEMOCYTE PLUS 106-1 MG Caps    Take 1 capsule by mouth daily.     HYDROcodone-acetaminophen 5-500 MG per tablet  Commonly known as:  VICODIN  Take 1 tablet by mouth every 4 (four) hours as needed for pain. For pain     levothyroxine 75 MCG tablet  Commonly known as:  SYNTHROID, LEVOTHROID  Take 75 mcg by mouth daily before breakfast.     loperamide 2 MG capsule  Commonly known as:  IMODIUM  Take 2 mg by mouth daily as needed for diarrhea or loose stools. For diarrhea     loratadine 10 MG tablet  Commonly known as:  CLARITIN  Take 10 mg by mouth daily.     LOVENOX 100 MG/ML injection  Generic drug:  enoxaparin  Inject 100 mg into the skin at bedtime.     meclizine 12.5 MG tablet  Commonly known as:  ANTIVERT  Take 12.5 mg by mouth 3 (three) times daily as needed. For dizziness     multivitamin tablet  Take 1 tablet by mouth every morning.     PRAVACHOL 40 MG tablet  Generic drug:  pravastatin  Take 40 mg by mouth every morning.     Travoprost (BAK Free) 0.004 % Soln ophthalmic solution  Commonly known as:  TRAVATAN  Place 1 drop into both eyes at bedtime.     Vitamin D (Ergocalciferol) 50000 UNITS Caps capsule  Commonly known as:  DRISDOL  Take 50,000 Units by mouth every 7 (seven) days. Takes on Sat         BRING ALL MEDICATIONS WITH YOU TO FOLLOW UP APPOINTMENTS  Time spent with patient to include physician time: 30 minutes Signed: Lesleigh Noe 03/09/2013, 3:14 PM

## 2013-03-16 DIAGNOSIS — R609 Edema, unspecified: Secondary | ICD-10-CM | POA: Diagnosis not present

## 2013-03-16 DIAGNOSIS — R413 Other amnesia: Secondary | ICD-10-CM | POA: Diagnosis not present

## 2013-03-16 DIAGNOSIS — N3090 Cystitis, unspecified without hematuria: Secondary | ICD-10-CM | POA: Diagnosis not present

## 2013-03-19 DIAGNOSIS — D649 Anemia, unspecified: Secondary | ICD-10-CM | POA: Diagnosis not present

## 2013-03-19 DIAGNOSIS — N183 Chronic kidney disease, stage 3 unspecified: Secondary | ICD-10-CM | POA: Diagnosis not present

## 2013-03-19 DIAGNOSIS — R609 Edema, unspecified: Secondary | ICD-10-CM | POA: Diagnosis not present

## 2013-03-19 DIAGNOSIS — R42 Dizziness and giddiness: Secondary | ICD-10-CM | POA: Diagnosis not present

## 2013-03-19 DIAGNOSIS — R071 Chest pain on breathing: Secondary | ICD-10-CM | POA: Diagnosis not present

## 2013-03-19 DIAGNOSIS — I129 Hypertensive chronic kidney disease with stage 1 through stage 4 chronic kidney disease, or unspecified chronic kidney disease: Secondary | ICD-10-CM | POA: Diagnosis not present

## 2013-03-22 ENCOUNTER — Other Ambulatory Visit: Payer: Self-pay | Admitting: Neurology

## 2013-03-23 ENCOUNTER — Other Ambulatory Visit: Payer: Self-pay | Admitting: Neurology

## 2013-03-29 ENCOUNTER — Encounter: Payer: Self-pay | Admitting: Podiatry

## 2013-03-29 ENCOUNTER — Ambulatory Visit (INDEPENDENT_AMBULATORY_CARE_PROVIDER_SITE_OTHER): Payer: Medicare Other | Admitting: Podiatry

## 2013-03-29 VITALS — BP 145/70 | HR 77 | Resp 12

## 2013-03-29 DIAGNOSIS — Q828 Other specified congenital malformations of skin: Secondary | ICD-10-CM

## 2013-03-29 DIAGNOSIS — M79609 Pain in unspecified limb: Secondary | ICD-10-CM

## 2013-03-29 DIAGNOSIS — B351 Tinea unguium: Secondary | ICD-10-CM

## 2013-03-29 NOTE — Progress Notes (Signed)
   Subjective:    Patient ID: [name], female    DOB: 05/26/1929, 80 y.o.   MRN: [id number]  HPI Comments: '' TOENAILS AND RT FOOT CALLUS TRIM.''  This patient presents for ongoing debridement of painful toenails and keratoses at approximately three-month intervals. She has been a patient in this practice since 2006. Her last visit was 10/21/2012    Review of Systems     Objective:   Physical Exam  Orientated x69 80 year old white female  Dermatological: Hypertrophic, elongated, brittle, discolored toenails x10. Nucleated keratoses plantar right first MPJ.        Assessment & Plan:   Assessment: Symptomatic onychomycoses x10 Porokeratoses x1  Plan: Nails and keratoses x1 debrided back without any bleeding. Reappoint at three-month intervals

## 2013-03-30 DIAGNOSIS — I129 Hypertensive chronic kidney disease with stage 1 through stage 4 chronic kidney disease, or unspecified chronic kidney disease: Secondary | ICD-10-CM | POA: Diagnosis not present

## 2013-03-30 DIAGNOSIS — E785 Hyperlipidemia, unspecified: Secondary | ICD-10-CM | POA: Diagnosis not present

## 2013-03-30 DIAGNOSIS — R071 Chest pain on breathing: Secondary | ICD-10-CM | POA: Diagnosis not present

## 2013-03-30 DIAGNOSIS — N183 Chronic kidney disease, stage 3 unspecified: Secondary | ICD-10-CM | POA: Diagnosis not present

## 2013-04-07 ENCOUNTER — Encounter: Payer: Self-pay | Admitting: Interventional Cardiology

## 2013-04-07 ENCOUNTER — Ambulatory Visit (INDEPENDENT_AMBULATORY_CARE_PROVIDER_SITE_OTHER): Payer: Medicare Other | Admitting: Interventional Cardiology

## 2013-04-07 VITALS — BP 158/70 | HR 69 | Ht 65.0 in | Wt 223.4 lb

## 2013-04-07 DIAGNOSIS — I498 Other specified cardiac arrhythmias: Secondary | ICD-10-CM

## 2013-04-07 DIAGNOSIS — I480 Paroxysmal atrial fibrillation: Secondary | ICD-10-CM

## 2013-04-07 DIAGNOSIS — I4891 Unspecified atrial fibrillation: Secondary | ICD-10-CM | POA: Diagnosis not present

## 2013-04-07 DIAGNOSIS — Z7901 Long term (current) use of anticoagulants: Secondary | ICD-10-CM | POA: Diagnosis not present

## 2013-04-07 DIAGNOSIS — R001 Bradycardia, unspecified: Secondary | ICD-10-CM

## 2013-04-07 DIAGNOSIS — I10 Essential (primary) hypertension: Secondary | ICD-10-CM | POA: Diagnosis not present

## 2013-04-07 MED ORDER — AMLODIPINE BESYLATE 2.5 MG PO TABS: 2.5000 mg | ORAL_TABLET | Freq: Every day | ORAL | Status: DC

## 2013-04-07 NOTE — Progress Notes (Signed)
Patient ID: [name], female   DOB: Oct 09, 1929, 79 y.o.   MRN: [id number]    [address], [address] [place], [place]  [address] Phone: [phone] Fax:  [phone]  Date:  04/07/2013   ID:  [name], DOB 1929/07/08, MRN [id number]  PCP:  Vidal Schwalbe, MD   ASSESSMENT:  1. Bradycardia, resolved 2. Paroxysmal atrial fibrillation, without recurrence 3. Chronic anticoagulation on enoxaparin 4. Hypertension, with poor control of systolic pressure  PLAN:  1. Amlodipine 2.5 mg per day 2. Six-month clinical followup 3. Call if blood pressure remains consistently above 150 mm mercury 4. Monitor for recurrent bradycardia and possible need for pacemaker if antiarrhythmic therapy as needed because of recurrent A. fib.   SUBJECTIVE: [name] is a 80 y.o. female is doing well. Fatigue and dizziness have resolved after discontinuation of sotalol. She has had no prolonged palpitations to suggest recurrent atrial fibrillation. At this point we have no plans to resume sotalol therapy. If atrial fibrillation recurs and causes symptoms, we will consider antiarrhythmic therapy and will have to possibly consider pacemaker. We discussed this and both the patient and the daughter seemed to understand.   Wt Readings from Last 3 Encounters:  04/07/13 223 lb 6.4 oz (101.334 kg)  03/08/13 229 lb 11.5 oz (104.2 kg)  11/10/12 234 lb 11.2 oz (106.459 kg)     Past Medical History  Diagnosis Date  . Shingles     zoster 1990 on the back and on the abdomen in 2007,facial zoster 2006  . Hypertension   . Hypercholesterolemia   . A-fib   . Diabetes mellitus without complication   . Glaucoma   . Pulmonary embolism recurrent     IVC filter  . Acid reflux   . Arthritis   . Anxiety   . Migraine   . Postherpetic trigeminal neuralgia 10/20/2012  . Unspecified cerebral artery occlusion with cerebral infarction   . Memory loss   . Chronic anticoagulation 03/07/2013    Lovenox 100  QD  . Junctional bradycardia     resolved with discontinuation of sotalol (2014) after 10 yrs    Current Outpatient Prescriptions  Medication Sig Dispense Refill  . CARBATROL 100 MG 12 hr capsule TAKE THREE CAPSULES BY MOUTH EVERY MORNING, TWO CAPSULES AT NOON, AND THREE CAPSULES AT BEDTIME. TAKE AFTER MEALS.  240 capsule  1  . enoxaparin (LOVENOX) 100 MG/ML injection Inject 100 mg into the skin at bedtime.       Marland Kitchen EPINEPHrine (EPIPEN IJ) Inject as directed once as needed. For allergic reactions      . Fe Fum-FA-B Cmp-C-Zn-Mg-Mn-Cu (HEMOCYTE PLUS) 106-1 MG CAPS Take 1 capsule by mouth daily.       . folic acid (FOLVITE) 1 MG tablet Take 1 mg by mouth every morning.       . furosemide (LASIX) 20 MG tablet       . gabapentin (NEURONTIN) 300 MG capsule Take 900 mg by mouth at bedtime.      . Gabapentin, PHN, ([name]) 300 MG TABS Take 600 mg by mouth every evening.  60 tablet  0  . HYDROcodone-acetaminophen (VICODIN) 5-500 MG per tablet Take 1 tablet by mouth every 4 (four) hours as needed for pain. For pain      . levothyroxine (SYNTHROID, LEVOTHROID) 75 MCG tablet Take 75 mcg by mouth daily before breakfast.       . loperamide (IMODIUM) 2 MG capsule Take 2 mg by mouth daily  as needed for diarrhea or loose stools. For diarrhea      . loratadine (CLARITIN) 10 MG tablet Take 10 mg by mouth daily.      . meclizine (ANTIVERT) 12.5 MG tablet Take 12.5 mg by mouth 3 (three) times daily as needed. For dizziness      . Menthol (EUCERIN SKIN CALMING) 0.1 % LOTN Apply 1 application topically daily as needed. For dry skin      . Multiple Vitamin (MULTIVITAMIN) tablet Take 1 tablet by mouth every morning.       . pravastatin (PRAVACHOL) 40 MG tablet Take 40 mg by mouth every morning.       . RABEprazole (ACIPHEX) 20 MG tablet Take 20 mg by mouth every morning.       . Travoprost, BAK Free, (TRAVATAN) 0.004 % SOLN ophthalmic solution Place 1 drop into both eyes at bedtime.      . Vitamin D, Ergocalciferol,  (DRISDOL) 50000 UNITS CAPS capsule Take 50,000 Units by mouth every 7 (seven) days. Takes on Sat       No current facility-administered medications for this visit.    Allergies:    Allergies  Allergen Reactions  . Ambien [Zolpidem Tartrate]     A high, hives  . Benadryl [Diphenhydramine Hcl]     hives  . Betadine Antibiotic-Moisturize [Bacitracin-Polymyxin B]   . Biaxin [Clarithromycin]     hives  . Celebrex [Celecoxib]   . Ciprofloxacin Hcl     rash  . Combigan [Brimonidine Tartrate-Timolol]   . Coumadin [Warfarin Sodium]     rash  . Coumarin   . Eye Drops [Tetrahydrazoline Hcl]     Burning, itching, swelling  . Flu Virus Vaccine     Short of breath  . Ivp Dye [Iodinated Diagnostic Agents]     hives  . Levaquin [Levofloxacin In D5w]     rash  . Naprosyn [Naproxen]     rash  . Pataday [Olopatadine Hcl]   . Penicillins     hives  . Plavix [Clopidogrel Bisulfate]     rash  . Septra [Sulfamethoxazole-Tmp Ds]     hives  . Shrimp [Shellfish Allergy]     anaphylaxis  . Voltaren [Diclofenac Sodium]     reflux  . Gluten Meal Diarrhea  . Iohexol      Code: HIVES, Desc: PT IS ALLERGIC TO IVP DYE/IODINE/SHRIMP.  CAUSES SOB AND HIVES PER PT.  STEPHANIE, RT-R, Onset Date: [id number]     Social History:  The patient  reports that she has never smoked. She has never used smokeless tobacco. She reports that she drinks alcohol. She reports that she does not use illicit drugs.   ROS:  Please see the history of present illness.   No bleeding on Lovenox. No neurological complaints. Denies palpitations.   All other systems reviewed and negative.   OBJECTIVE: VS:  BP 158/70  Pulse 69  Ht 5\' 5"  (1.651 m)  Wt 223 lb 6.4 oz (101.334 kg)  BMI 37.18 kg/m2 Well nourished, well developed, in no acute distress, pleasant HEENT: normal Neck: JVD flat. Carotid bruit absent  Cardiac:  normal S1, S2; RRR; no murmur Lungs:  clear to auscultation bilaterally, no wheezing, rhonchi or  rales Abd: soft, nontender, no hepatomegaly Ext: Edema  Absent . Pulses 2+ and symmetric  Skin: warm and dry Neuro:  CNs 2-12 intact, no focal abnormalities noted  EKG:  Sinus rhythm with normal EKG and normal intervals       Signed,  Illene Labrador III, MD 04/07/2013 4:28 PM

## 2013-04-07 NOTE — Patient Instructions (Addendum)
Start Amlodipine 2.5mg  daily an Rx has been sent to your pharmacy  Monitor your blood pressure at home call if your top number is consistently over 150 please call the office.  Your physician wants you to follow-up in: 6 months You will receive a reminder letter in the mail two months in advance. If you don't receive a letter, please call our office to schedule the follow-up appointment.

## 2013-04-08 ENCOUNTER — Encounter: Payer: Self-pay | Admitting: Interventional Cardiology

## 2013-04-19 ENCOUNTER — Other Ambulatory Visit: Payer: Self-pay | Admitting: Neurology

## 2013-04-20 NOTE — Telephone Encounter (Signed)
Patient asked to stay on 600mg  because she felt stable on this dose.  Dr Dohmeier agreed.

## 2013-04-22 ENCOUNTER — Encounter: Payer: Self-pay | Admitting: Nurse Practitioner

## 2013-04-22 ENCOUNTER — Ambulatory Visit (INDEPENDENT_AMBULATORY_CARE_PROVIDER_SITE_OTHER): Payer: Medicare Other | Admitting: Nurse Practitioner

## 2013-04-22 VITALS — BP 148/66 | HR 74 | Ht 63.5 in | Wt 225.0 lb

## 2013-04-22 DIAGNOSIS — Z79899 Other long term (current) drug therapy: Secondary | ICD-10-CM | POA: Diagnosis not present

## 2013-04-22 DIAGNOSIS — B0222 Postherpetic trigeminal neuralgia: Secondary | ICD-10-CM | POA: Diagnosis not present

## 2013-04-22 DIAGNOSIS — I6350 Cerebral infarction due to unspecified occlusion or stenosis of unspecified cerebral artery: Secondary | ICD-10-CM

## 2013-04-22 NOTE — Progress Notes (Signed)
[facility]  PATIENT: [name] DOB: Nov 11, 1929   REASON FOR VISIT: Followup for trigeminal neuralgia and memory loss   HISTORY OF PRESENT ILLNESS: [name], 80 year old female returns for followup. She has a history of trigeminal neuralgia and mild memory changes. She also has a history of gait disorder and sleep apnea. She returns today for followup and would like to titrate off of her  Carbatrol. She is taking gabapentin 300 at bedtime. She is ambulating with a rolling walker, she has had one bad fall since last seen when  she did not use her walker. She says her memory is stable. She returns today with her daughter. She needs to have labs drawn. No new complaints     HISTORY: atypical facial pain and changes in the quality of her facial pain.  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  Breakthrough pain to the right Face, after initially doing well on carbamazepine Carbatrol brandname . The onset of her facial pain was related to herpes zoster of facial shingles and 2006 and therefore treated as a postherpetic neuralgia. The first treatment with Neurontin did not provide relief in 2006. Change from carbamazepine Brand to generic let to break through pain. She continued to take her medication independently had been doing well as her activities of daily living except for problems with ambulation. CT of the had short non-acute chronic left cerebellar infarcts. She also has a history of a gait disorder and sleep apnea. Memory testing has been provided over the last 2 years intermittently and today again the patient scored 29/30 on the Folstein Mini-Mental test and animal fluency test scored 15 points this would be well in normal limits.       REVIEW OF SYSTEMS: Full 14 system review of systems performed and notable only for those listed, all others are neg:    Constitutional: N/A  Cardiovascular: N/A  Ear/Nose/Throat: N/A  Skin: N/A  Eyes: N/A  Respiratory: N/A  Gastroitestinal: N/A  Hematology/Lymphatic: N/A  Endocrine: N/A Musculoskeletal: Gait abnormality  Neurological: Memory loss Psychiatric: N/A   ALLERGIES: Allergies  Allergen Reactions  . Ambien [Zolpidem Tartrate]     A high, hives  . Benadryl [Diphenhydramine Hcl]     hives  . Betadine Antibiotic-Moisturize [Bacitracin-Polymyxin B]   . Biaxin [Clarithromycin]     hives  . Celebrex [Celecoxib]   . Ciprofloxacin Hcl     rash  . Combigan [Brimonidine Tartrate-Timolol]   . Coumadin [Warfarin Sodium]     rash  . Coumarin   . Eye Drops [Tetrahydrazoline Hcl]     Burning, itching, swelling  . Flu Virus Vaccine     Short of breath  . Ivp Dye [Iodinated Diagnostic Agents]     hives  . Levaquin [Levofloxacin In D5w]     rash  . Naprosyn [Naproxen]     rash  . Pataday [Olopatadine Hcl]   . Penicillins     hives  . Plavix [Clopidogrel Bisulfate]     rash  . Septra [Sulfamethoxazole-Tmp Ds]     hives  . Shrimp [Shellfish Allergy]     anaphylaxis  . Voltaren [Diclofenac Sodium]     reflux  . Gluten Meal Diarrhea  . Iohexol      Code: HIVES, Desc: PT IS ALLERGIC TO IVP DYE/IODINE/SHRIMP.  CAUSES SOB AND HIVES PER PT.  STEPHANIE, RT-R, Onset  Date: [id number]     HOME MEDICATIONS: Outpatient Prescriptions Prior to Visit  Medication Sig Dispense Refill  . amLODipine (NORVASC) 2.5 MG tablet Take 1 tablet (2.5 mg total) by mouth daily.  30 tablet  11  . CARBATROL 100 MG 12 hr capsule TAKE THREE CAPSULES BY MOUTH EVERY MORNING, TWO CAPSULES AT NOON, AND THREE CAPSULES AT BEDTIME. TAKE AFTER MEALS.  240 capsule  3  . enoxaparin (LOVENOX) 100 MG/ML injection Inject 100 mg into the skin at bedtime.       Marland Kitchen EPINEPHrine (EPIPEN IJ) Inject as directed once as needed. For allergic reactions      . Fe Fum-FA-B Cmp-C-Zn-Mg-Mn-Cu (HEMOCYTE PLUS) 106-1 MG CAPS Take 1 capsule by  mouth daily.       . folic acid (FOLVITE) 1 MG tablet Take 1 mg by mouth every morning.       . furosemide (LASIX) 20 MG tablet       . HYDROcodone-acetaminophen (VICODIN) 5-500 MG per tablet Take 1 tablet by mouth every 4 (four) hours as needed for pain. For pain      . levothyroxine (SYNTHROID, LEVOTHROID) 75 MCG tablet Take 75 mcg by mouth daily before breakfast.       . loperamide (IMODIUM) 2 MG capsule Take 2 mg by mouth daily as needed for diarrhea or loose stools. For diarrhea      . loratadine (CLARITIN) 10 MG tablet Take 10 mg by mouth daily.      . meclizine (ANTIVERT) 12.5 MG tablet Take 12.5 mg by mouth 3 (three) times daily as needed. For dizziness      . Menthol (EUCERIN SKIN CALMING) 0.1 % LOTN Apply 1 application topically daily as needed. For dry skin      . Multiple Vitamin (MULTIVITAMIN) tablet Take 1 tablet by mouth every morning.       . pravastatin (PRAVACHOL) 40 MG tablet Take 40 mg by mouth every morning.       . RABEprazole (ACIPHEX) 20 MG tablet Take 20 mg by mouth every morning.       . Travoprost, BAK Free, (TRAVATAN) 0.004 % SOLN ophthalmic solution Place 1 drop into both eyes at bedtime.      . Vitamin D, Ergocalciferol, (DRISDOL) 50000 UNITS CAPS capsule Take 50,000 Units by mouth every 7 (seven) days. Takes on Sat      . gabapentin (NEURONTIN) 300 MG capsule Take 300 mg by mouth at bedtime.       . Gabapentin, PHN, ([name]) 300 MG TABS Take 600 mg by mouth every evening.  60 tablet  0  . Gabapentin, PHN, ([name]) 300 MG TABS Take 600 mg by mouth at bedtime.  60 tablet  3   No facility-administered medications prior to visit.    PAST MEDICAL HISTORY: Past Medical History  Diagnosis Date  . Shingles     zoster 1990 on the back and on the abdomen in 2007,facial zoster 2006  . Hypertension   . Hypercholesterolemia   . A-fib   . Diabetes mellitus without complication   . Glaucoma   . Pulmonary embolism recurrent     IVC filter  . Acid reflux   .  Arthritis   . Anxiety   . Migraine   . Postherpetic trigeminal neuralgia 10/20/2012  . Unspecified cerebral artery occlusion with cerebral infarction   . Memory loss   . Chronic anticoagulation 03/07/2013    Lovenox 100 QD  . Junctional bradycardia     resolved with discontinuation  of sotalol (2014) after 10 yrs    PAST SURGICAL HISTORY: Past Surgical History  Procedure Laterality Date  . Blood clots  06/2006    lower abdomen  . Pulmonary embolism surgery  03/2005  . Phlebitis  12/2005  . Cholecystectomy  01/1983  . Biopsies of breast Bilateral 1971  . Migraine speech impairment  07/1963  . Hemorrhoid surgery  1966  . Appendectomy  1945  . Tonsillectomy      1954  . Gallbladder surgery  1980  . Insertion of vena cova filter  2007  . Leg / ankle soft tissue biopsy Right     precancerous    FAMILY HISTORY: Family History  Problem Relation Age of Onset  . Diabetes Mother   . Hypertension Mother   . Hypertension Father   . Heart attack Brother     2 half brothers  . Glaucoma Other   . Glaucoma Other     SOCIAL HISTORY: History   Social History  . Marital Status: Divorced    Spouse Name: N/A    Number of Children: 3  . Years of Education: 14   Occupational History  . retired     Armed forces training and education officer   Social History Main Topics  . Smoking status: Never Smoker   . Smokeless tobacco: Never Used  . Alcohol Use: Yes     Comment: very moderate, none  in the last two months  . Drug Use: No  . Sexual Activity: Not on file   Other Topics Concern  . Not on file   Social History Narrative   Patient is retired and lives at home alone. Patient has a college education.    Patient has three children.   Patient is right-handed.   Patient drinks 1-2 cups of caffeine daily.     PHYSICAL EXAM  Filed Vitals:   04/22/13 1452  BP: 148/66  Pulse: 74  Height: 5' 3.5" (1.613 m)  Weight: 225 lb (102.059 kg)   Body mass index is 39.23 kg/(m^2).  Generalized:  Well developed, in no acute distress, well groomed  Head: normocephalic and atraumatic,. Oropharynx benign  Neck: Supple, no carotid bruits  Cardiac: Regular rate rhythm, no murmur  Musculoskeletal: No deformity   Neurological examination   Mentation: Alert oriented to time, place, history taking. MMSE 29/30. AFT 12. Follows all commands speech and language fluent  Cranial nerve II-XII: Pupils were equal round reactive to light extraocular movements were full, visual field were full on confrontational test. Facial strength were normal. hearing was intact to finger rubbing bilaterally. Uvula tongue midline. head turning and shoulder shrug were normal and symmetric.Tongue protrusion into cheek strength was normal. Motor: normal bulk and tone, full strength in the BUE, BLE,  No focal weakness Coordination: finger-nose-finger, heel-to-shin bilaterally, no dysmetria Gait and Station: Rising up from seated position without assistance, wide based with rolling walker  DIAGNOSTIC DATA (LABS, IMAGING, TESTING) - I reviewed patient records, labs, notes, testing and imaging myself where available.  Lab Results  Component Value Date   WBC 10.0 03/07/2013   HGB 10.9* 03/07/2013   HCT 31.5* 03/07/2013   MCV 82.7 03/07/2013   PLT 127* 03/07/2013      Component Value Date/Time   NA 135 03/09/2013 0355   NA 132* 10/23/2012 1119   K 4.4 03/09/2013 0355   K 5.0 10/23/2012 1119   CL 105 03/09/2013 0355   CO2 20 03/09/2013 0355   CO2 22 10/23/2012 1119   GLUCOSE 86  03/09/2013 0355   GLUCOSE 90 10/23/2012 1119   BUN 17 03/09/2013 0355   BUN 19.4 10/23/2012 1119   CREATININE 1.21* 03/09/2013 0355   CREATININE 1.1 10/23/2012 1119   CALCIUM 8.2* 03/09/2013 0355   CALCIUM 8.8 10/23/2012 1119   PROT 7.1 03/06/2013 1841   PROT 6.9 10/21/2012 1535   ALBUMIN 4.1 03/06/2013 1841   ALBUMIN 3.8 10/21/2012 1535   AST 14 03/06/2013 1841   AST 12 10/21/2012 1535   ALT 7 03/06/2013 1841   ALT 7 10/21/2012 1535   ALKPHOS  95 03/06/2013 1841   ALKPHOS 86 10/21/2012 1535   BILITOT 0.2* 03/06/2013 1841   BILITOT 0.26 10/21/2012 1535   GFRNONAA 41* 03/09/2013 0355   GFRAA 47* 03/09/2013 0355   Lab Results  Component Value Date   CHOL 167 03/07/2013   HDL 79 03/07/2013   LDLCALC 75 03/07/2013   TRIG 67 03/07/2013   CHOLHDL 2.1 03/07/2013    Lab Results  Component Value Date   TSH 1.012 03/06/2013      ASSESSMENT AND PLAN  80 y.o. year old female  has a past medical history of Shingles; postherpetic neuralgia of the right face stable on carbamazepine she is also on gabapentin at night. She does not report jaw claudication. Memory score is stable. She is a pt of Dr. Brett Fairy  Who is out of the office.   Will check labs Continue Carbitol and Gabapentin.for now. Patient would like to decrease CBZ. Memory stable F/U 6 months Dennie Bible, [facility], [facility], APRN  [facility] [address] [place], [place] [address] [phone]

## 2013-04-22 NOTE — Patient Instructions (Signed)
Will check labs Continue Carbitol and Gabapentin. Memory stable F/U 6 months

## 2013-04-23 LAB — BASIC METABOLIC PANEL
BUN/Creatinine Ratio: 13 (ref 11–26)
BUN: 16 mg/dL (ref 8–27)
CO2: 19 mmol/L (ref 18–29)
CREATININE: 1.21 mg/dL — AB (ref 0.57–1.00)
Calcium: 8.8 mg/dL (ref 8.7–10.3)
Chloride: 102 mmol/L (ref 97–108)
GFR calc Af Amer: 48 mL/min/{1.73_m2} — ABNORMAL LOW (ref >59)
GFR calc non Af Amer: 42 mL/min/{1.73_m2} — ABNORMAL LOW (ref >59)
GLUCOSE: 124 mg/dL — AB (ref 65–99)
Potassium: 4.9 mmol/L (ref 3.5–5.2)
Sodium: 139 mmol/L (ref 134–144)

## 2013-04-23 LAB — CARBAMAZEPINE LEVEL, TOTAL: Carbamazepine Lvl: 7 ug/mL (ref 4.0–12.0)

## 2013-04-29 DIAGNOSIS — R7309 Other abnormal glucose: Secondary | ICD-10-CM | POA: Diagnosis not present

## 2013-05-14 DIAGNOSIS — H2510 Age-related nuclear cataract, unspecified eye: Secondary | ICD-10-CM | POA: Diagnosis not present

## 2013-05-14 DIAGNOSIS — H4011X0 Primary open-angle glaucoma, stage unspecified: Secondary | ICD-10-CM | POA: Diagnosis not present

## 2013-05-14 DIAGNOSIS — H409 Unspecified glaucoma: Secondary | ICD-10-CM | POA: Diagnosis not present

## 2013-05-19 DIAGNOSIS — Z803 Family history of malignant neoplasm of breast: Secondary | ICD-10-CM | POA: Diagnosis not present

## 2013-05-19 DIAGNOSIS — Z1231 Encounter for screening mammogram for malignant neoplasm of breast: Secondary | ICD-10-CM | POA: Diagnosis not present

## 2013-05-21 ENCOUNTER — Telehealth: Payer: Self-pay | Admitting: Interventional Cardiology

## 2013-05-21 NOTE — Telephone Encounter (Signed)
Go up to 5 mg daily

## 2013-05-21 NOTE — Telephone Encounter (Signed)
returned pt call.pt sts that her systolic bp has been consistently over 150 the last couple of weeks.pt had been instructed to call the office if bp was consistently over 150.pt was started on amlodipine 2.5mg  daily at last o/v on 04/07/13.pt adv that i will fwd to Dr.Smith and call back with his recommendations.pt verbalized understanding

## 2013-05-21 NOTE — Telephone Encounter (Signed)
New message         Pt bp is running 154 and 159 since medication has been change to amelodopine.

## 2013-05-24 ENCOUNTER — Encounter: Payer: Self-pay | Admitting: General Surgery

## 2013-05-25 MED ORDER — AMLODIPINE BESYLATE 5 MG PO TABS: 5.0000 mg | ORAL_TABLET | Freq: Every day | ORAL | Status: DC

## 2013-05-25 NOTE — Telephone Encounter (Signed)
pt given Dr.Smith instructions. increase amlodipine to 5mg  daily.continue to monitor bp, call if systolic is consistently over 150.pt agreeable with plan and verbalized understanidng.

## 2013-05-26 ENCOUNTER — Telehealth: Payer: Self-pay | Admitting: Nurse Practitioner

## 2013-05-26 NOTE — Telephone Encounter (Signed)
TC to patient, that would be a question for who is doing the surgery. She states they have not discussed the eye surgery in detail, that will occur next week. She will ask the surgeon.

## 2013-05-26 NOTE — Telephone Encounter (Signed)
Pt called states she is having eye surgery for cataracts wants to know if pt has this surgery will she have the chance of getting trigeminal neuralgia back. Pt would like for NP/CM to call her back concerning this matter.

## 2013-06-08 DIAGNOSIS — N183 Chronic kidney disease, stage 3 unspecified: Secondary | ICD-10-CM | POA: Diagnosis not present

## 2013-06-08 DIAGNOSIS — H539 Unspecified visual disturbance: Secondary | ICD-10-CM | POA: Diagnosis not present

## 2013-06-08 DIAGNOSIS — I129 Hypertensive chronic kidney disease with stage 1 through stage 4 chronic kidney disease, or unspecified chronic kidney disease: Secondary | ICD-10-CM | POA: Diagnosis not present

## 2013-06-08 DIAGNOSIS — I4891 Unspecified atrial fibrillation: Secondary | ICD-10-CM | POA: Diagnosis not present

## 2013-06-08 DIAGNOSIS — R42 Dizziness and giddiness: Secondary | ICD-10-CM | POA: Diagnosis not present

## 2013-06-08 DIAGNOSIS — E039 Hypothyroidism, unspecified: Secondary | ICD-10-CM | POA: Diagnosis not present

## 2013-06-28 ENCOUNTER — Ambulatory Visit: Payer: Medicare Other | Admitting: Podiatry

## 2013-07-05 ENCOUNTER — Ambulatory Visit (INDEPENDENT_AMBULATORY_CARE_PROVIDER_SITE_OTHER): Payer: Medicare Other | Admitting: Podiatry

## 2013-07-05 ENCOUNTER — Encounter: Payer: Self-pay | Admitting: Podiatry

## 2013-07-05 VITALS — BP 110/75 | HR 66 | Resp 18 | Ht 63.0 in | Wt 221.0 lb

## 2013-07-05 DIAGNOSIS — B351 Tinea unguium: Secondary | ICD-10-CM

## 2013-07-05 DIAGNOSIS — M79609 Pain in unspecified limb: Secondary | ICD-10-CM

## 2013-07-05 DIAGNOSIS — Q828 Other specified congenital malformations of skin: Secondary | ICD-10-CM | POA: Diagnosis not present

## 2013-07-05 NOTE — Progress Notes (Signed)
Patient ID: [name], female   DOB: [birth year], 80 y.o.   MRN: [id number]  Subjective: This patient presents for ongoing debridement of painful mycotic toenails and keratoses.  Objective: Elongated, hypertrophic, brittle, discolored toenails x10. Large nucleated keratoses plantar right first MPJ  Assessment: Symptomatic onychomycoses x10 Porokeratoses x1  Plan: Nails x10 keratoses x1 debrided without a bleeding. Reappoint at three-month intervals.

## 2013-07-06 DIAGNOSIS — H2589 Other age-related cataract: Secondary | ICD-10-CM | POA: Diagnosis not present

## 2013-07-06 DIAGNOSIS — H52209 Unspecified astigmatism, unspecified eye: Secondary | ICD-10-CM | POA: Diagnosis not present

## 2013-07-06 DIAGNOSIS — H2510 Age-related nuclear cataract, unspecified eye: Secondary | ICD-10-CM | POA: Diagnosis not present

## 2013-07-09 DIAGNOSIS — I129 Hypertensive chronic kidney disease with stage 1 through stage 4 chronic kidney disease, or unspecified chronic kidney disease: Secondary | ICD-10-CM | POA: Diagnosis not present

## 2013-07-09 DIAGNOSIS — Z79899 Other long term (current) drug therapy: Secondary | ICD-10-CM | POA: Diagnosis not present

## 2013-07-09 DIAGNOSIS — E559 Vitamin D deficiency, unspecified: Secondary | ICD-10-CM | POA: Diagnosis not present

## 2013-07-09 DIAGNOSIS — E785 Hyperlipidemia, unspecified: Secondary | ICD-10-CM | POA: Diagnosis not present

## 2013-07-09 DIAGNOSIS — N183 Chronic kidney disease, stage 3 unspecified: Secondary | ICD-10-CM | POA: Diagnosis not present

## 2013-07-09 DIAGNOSIS — R4789 Other speech disturbances: Secondary | ICD-10-CM | POA: Diagnosis not present

## 2013-07-09 DIAGNOSIS — K219 Gastro-esophageal reflux disease without esophagitis: Secondary | ICD-10-CM | POA: Diagnosis not present

## 2013-07-23 ENCOUNTER — Emergency Department (HOSPITAL_COMMUNITY)
Admission: EM | Admit: 2013-07-23 | Discharge: 2013-07-23 | Disposition: A | Discharge status: Home / Self Care | Payer: Medicare Other | Attending: Emergency Medicine | Admitting: Emergency Medicine

## 2013-07-23 ENCOUNTER — Emergency Department (HOSPITAL_COMMUNITY): Payer: Medicare Other

## 2013-07-23 DIAGNOSIS — Z79899 Other long term (current) drug therapy: Secondary | ICD-10-CM | POA: Insufficient documentation

## 2013-07-23 DIAGNOSIS — Z8619 Personal history of other infectious and parasitic diseases: Secondary | ICD-10-CM | POA: Insufficient documentation

## 2013-07-23 DIAGNOSIS — Z8659 Personal history of other mental and behavioral disorders: Secondary | ICD-10-CM | POA: Insufficient documentation

## 2013-07-23 DIAGNOSIS — Z8673 Personal history of transient ischemic attack (TIA), and cerebral infarction without residual deficits: Secondary | ICD-10-CM | POA: Insufficient documentation

## 2013-07-23 DIAGNOSIS — I129 Hypertensive chronic kidney disease with stage 1 through stage 4 chronic kidney disease, or unspecified chronic kidney disease: Secondary | ICD-10-CM | POA: Insufficient documentation

## 2013-07-23 DIAGNOSIS — Z86711 Personal history of pulmonary embolism: Secondary | ICD-10-CM | POA: Diagnosis not present

## 2013-07-23 DIAGNOSIS — Z8739 Personal history of other diseases of the musculoskeletal system and connective tissue: Secondary | ICD-10-CM | POA: Diagnosis not present

## 2013-07-23 DIAGNOSIS — F0390 Unspecified dementia without behavioral disturbance: Secondary | ICD-10-CM | POA: Diagnosis not present

## 2013-07-23 DIAGNOSIS — W1809XA Striking against other object with subsequent fall, initial encounter: Secondary | ICD-10-CM | POA: Insufficient documentation

## 2013-07-23 DIAGNOSIS — T148XXA Other injury of unspecified body region, initial encounter: Secondary | ICD-10-CM | POA: Diagnosis not present

## 2013-07-23 DIAGNOSIS — S0993XA Unspecified injury of face, initial encounter: Secondary | ICD-10-CM | POA: Diagnosis not present

## 2013-07-23 DIAGNOSIS — S0191XA Laceration without foreign body of unspecified part of head, initial encounter: Secondary | ICD-10-CM

## 2013-07-23 DIAGNOSIS — W19XXXA Unspecified fall, initial encounter: Secondary | ICD-10-CM

## 2013-07-23 DIAGNOSIS — S52599A Other fractures of lower end of unspecified radius, initial encounter for closed fracture: Secondary | ICD-10-CM | POA: Insufficient documentation

## 2013-07-23 DIAGNOSIS — Z880 Allergy status to penicillin: Secondary | ICD-10-CM | POA: Diagnosis not present

## 2013-07-23 DIAGNOSIS — M25539 Pain in unspecified wrist: Secondary | ICD-10-CM | POA: Diagnosis not present

## 2013-07-23 DIAGNOSIS — N183 Chronic kidney disease, stage 3 unspecified: Secondary | ICD-10-CM | POA: Insufficient documentation

## 2013-07-23 DIAGNOSIS — E7800 Pure hypercholesterolemia, unspecified: Secondary | ICD-10-CM | POA: Insufficient documentation

## 2013-07-23 DIAGNOSIS — E039 Hypothyroidism, unspecified: Secondary | ICD-10-CM | POA: Diagnosis not present

## 2013-07-23 DIAGNOSIS — S0990XA Unspecified injury of head, initial encounter: Secondary | ICD-10-CM | POA: Diagnosis not present

## 2013-07-23 DIAGNOSIS — S52502A Unspecified fracture of the lower end of left radius, initial encounter for closed fracture: Secondary | ICD-10-CM

## 2013-07-23 DIAGNOSIS — Z86718 Personal history of other venous thrombosis and embolism: Secondary | ICD-10-CM | POA: Insufficient documentation

## 2013-07-23 DIAGNOSIS — Y92009 Unspecified place in unspecified non-institutional (private) residence as the place of occurrence of the external cause: Secondary | ICD-10-CM | POA: Insufficient documentation

## 2013-07-23 DIAGNOSIS — S0180XA Unspecified open wound of other part of head, initial encounter: Secondary | ICD-10-CM | POA: Insufficient documentation

## 2013-07-23 DIAGNOSIS — E119 Type 2 diabetes mellitus without complications: Secondary | ICD-10-CM | POA: Diagnosis not present

## 2013-07-23 DIAGNOSIS — K219 Gastro-esophageal reflux disease without esophagitis: Secondary | ICD-10-CM | POA: Diagnosis not present

## 2013-07-23 DIAGNOSIS — I10 Essential (primary) hypertension: Secondary | ICD-10-CM | POA: Diagnosis not present

## 2013-07-23 DIAGNOSIS — Y9301 Activity, walking, marching and hiking: Secondary | ICD-10-CM | POA: Insufficient documentation

## 2013-07-23 MED ORDER — ACETAMINOPHEN-CODEINE #3 300-30 MG PO TABS: 1.0000 | ORAL_TABLET | Freq: Four times a day (QID) | ORAL | Status: DC | PRN

## 2013-07-23 NOTE — ED Notes (Signed)
Ortho paged. 

## 2013-07-23 NOTE — ED Notes (Signed)
Ortho informed states has 2 patients ahead.

## 2013-07-23 NOTE — ED Notes (Signed)
PA at the bedside suturing pts laceration.

## 2013-07-23 NOTE — ED Notes (Signed)
Patient to [facility] ED via EMS. Patient states that she fell at home. States that she was in her bedroom and tripped over a basket.  States that she hit her head on the table.  States that she fell on her left arm.  RN notes that her left lateral wrist is very swollen and painful.  Ice pack applied. There is a 4 cm laceration about 3 cm above her left eye brow. Wound covered with a sterile 4 x 4.   Patient offers no other complaints.  RN notes no other injuries.

## 2013-07-23 NOTE — Discharge Instructions (Signed)
Take the prescribed medication as directed for pain.  Do not drive while taking this medication. Follow-up with hand surgery, Dr. Grandville Silos-- call and scheduled appt. Follow-up with your primary care physician for suture removal in 1 week. Return to the ED for new or worsening symptoms.

## 2013-07-23 NOTE — ED Notes (Signed)
Patient fell at home striking her head on a night stand. Landed on her left side injuring her left wrist and arm.

## 2013-07-23 NOTE — Progress Notes (Signed)
Orthopedic Tech Progress Note Patient Details:  [name] 1929/08/21 [id number]  Ortho Devices Type of Ortho Device: Ace wrap;Arm sling;Sugartong splint Ortho Device/Splint Location: lue Ortho Device/Splint Interventions: Application   [name] 07/23/2013, 8:56 PM

## 2013-07-23 NOTE — ED Notes (Signed)
Pt assisted to car by this RN. Pt and family educated on signs and symptoms of infection at suture site. Pt educated on how to care for fracture and splint.

## 2013-07-23 NOTE — ED Provider Notes (Signed)
CSN: [id number]     Arrival date & time 07/23/13  1610 History   First MD Initiated Contact with Patient 07/23/13 1620     Chief Complaint  Patient presents with  . Fall     (Consider location/radiation/quality/duration/timing/severity/associated sxs/prior Treatment) Patient is a 80 y.o. female presenting with fall. The history is provided by the patient and medical records.  Fall Associated symptoms include arthralgias and joint swelling.   This is an 80 year old female with past medical history significant for hypertension, and diabetes, recurrent pulmonary embolism on Lovenox s/p IVC filter placement, atrial fibrillation, presenting to the ED via EMS status post fall at home. Patient states she was walking to her bedroom and tripped over a basket in the floor. States she lost her footing, fell onto her left arm and her left head on a bedside table. She denies loss of consciousness. Patient now complains of left wrist and hand pain. Denies any numbness or paresthesias of left arm. No prior left wrist or hand injuries. Patient is right-hand dominant. Last by mouth intake 0830 this morning.  She does have a small laceration above her left eyebrow. She denies any headache, dizziness, visual disturbance, or confusion. Last tetanus was 3 years ago.  Past Medical History  Diagnosis Date  . Shingles     zoster 1990 on the back and on the abdomen in 2007,facial zoster 2006  . Hypertension   . A-fib   . Diabetes mellitus without complication   . Glaucoma   . Pulmonary embolism recurrent     IVC filter  . Acid reflux   . Arthritis   . Migraine   . Postherpetic trigeminal neuralgia 10/20/2012  . Unspecified cerebral artery occlusion with cerebral infarction   . Memory loss   . Chronic anticoagulation 03/07/2013    Lovenox 100 QD  . Junctional bradycardia     resolved with discontinuation of sotalol (2014) after 10 yrs  . DVT (deep venous thrombosis) 2007    recurrent w PE s/p [place]  filter  . Hypoglycemia   . GERD (gastroesophageal reflux disease)     w hiatal hernia endoscopy 2003, Dr Penelope Coop  . Diverticulosis   . Phlebitis   . Osteoarthritis     in Bilateral knees and back. -Dr Latanya Maudlin  . Panic attacks   . Anxiety   . IBS (irritable bowel syndrome)   . Hypothyroidism     goiter  . OSA (obstructive sleep apnea) Dx: 2008    could never use CPAP repeat study showed no sleep apnea (HST 03-03-13 ESS 8, AHI 2/hr)  . Vitamin D deficiency   . CVA (cerebral infarction) 1964  . Osteoporosis   . Trigeminal neuralgia     r face--Dr Dohmeier  . Hypercholesterolemia   . Chronic kidney disease     Stage III  . Dementia     Dr Brett Fairy   Past Surgical History  Procedure Laterality Date  . Blood clots  06/2006    lower abdomen  . Pulmonary embolism surgery  03/2005  . Phlebitis  12/2005  . Cholecystectomy  01/1983  . Biopsies of breast Bilateral 1971  . Migraine speech impairment  07/1963  . Hemorrhoid surgery  1966  . Appendectomy  1945  . Tonsillectomy      1954  . Gallbladder surgery  1980  . Insertion of vena cova filter  2007  . Leg / ankle soft tissue biopsy Right     precancerous  . Cardiac catheterization  08/2001  A-fib/Palpitations Dr. Daneen Schick  . Dilation and curettage, diagnostic / therapeutic  1952/1977    x2  . Foot surgery Right 1978    Cyst removed from Right Foot   Family History  Problem Relation Age of Onset  . Diabetes Mother   . Hypertension Mother   . Glaucoma Mother   . CVA Mother   . CAD Mother   . Hypertension Father   . Heart attack Father   . Kidney failure Father   . Heart attack Brother     2 half brothers  . Hypertension Brother   . CVA Brother   . Glaucoma Other   . Glaucoma Other   . Breast cancer Maternal Aunt   . Colon cancer Maternal Uncle   . Rectal cancer Maternal Uncle   . Prostate cancer Maternal Grandfather    History  Substance Use Topics  . Smoking status: Never Smoker   . Smokeless tobacco:  Never Used  . Alcohol Use: Yes     Comment: very moderate, none  in the last two months   OB History   Grav Para Term Preterm Abortions TAB SAB Ect Mult Living                 Review of Systems  Musculoskeletal: Positive for arthralgias and joint swelling.  Skin: Positive for wound.  All other systems reviewed and are negative.     Allergies  Ambien; Benadryl; Betadine antibiotic-moisturize; Biaxin; Celebrex; Ciprofloxacin hcl; Combigan; Coumadin; Coumarin; Eye drops; Flu virus vaccine; Ivp dye; Levaquin; Naprosyn; Pataday; Penicillins; Plavix; Septra; Shrimp; Voltaren; Gluten meal; and Iohexol  Home Medications   Prior to Admission medications   Medication Sig Start Date End Date Taking? Authorizing Provider  amLODipine (NORVASC) 5 MG tablet Take 1 tablet (5 mg total) by mouth daily. 05/25/13   Belva Crome III, MD  CARBATROL 100 MG 12 hr capsule TAKE THREE CAPSULES BY MOUTH EVERY MORNING, TWO CAPSULES AT NOON, AND THREE CAPSULES AT BEDTIME. TAKE AFTER MEALS. 04/19/13   Larey Seat, MD  enoxaparin (LOVENOX) 100 MG/ML injection Inject 100 mg into the skin at bedtime.     Historical Provider, MD  EPINEPHrine (EPIPEN IJ) Inject as directed once as needed. For allergic reactions    Historical Provider, MD  Fe Fum-FA-B Cmp-C-Zn-Mg-Mn-Cu (HEMOCYTE PLUS) 106-1 MG CAPS Take 1 capsule by mouth daily.     Historical Provider, MD  folic acid (FOLVITE) 1 MG tablet Take 1 mg by mouth every morning.     Historical Provider, MD  furosemide (LASIX) 20 MG tablet Take 20 mg by mouth as needed.  03/16/13   Historical Provider, MD  gabapentin (NEURONTIN) 300 MG capsule Take 300 mg by mouth at bedtime.    Historical Provider, MD  HYDROcodone-acetaminophen (VICODIN) 5-500 MG per tablet Take 1 tablet by mouth every 4 (four) hours as needed for pain. For pain    Historical Provider, MD  levothyroxine (SYNTHROID, LEVOTHROID) 75 MCG tablet Take 75 mcg by mouth daily before breakfast.     Historical  Provider, MD  loperamide (IMODIUM) 2 MG capsule Take 2 mg by mouth daily as needed for diarrhea or loose stools. For diarrhea    Historical Provider, MD  loratadine (CLARITIN) 10 MG tablet Take 10 mg by mouth daily.    Historical Provider, MD  meclizine (ANTIVERT) 12.5 MG tablet Take 12.5 mg by mouth 3 (three) times daily as needed. For dizziness    Historical Provider, MD  Menthol (EUCERIN SKIN CALMING) 0.1 %  LOTN Apply 1 application topically daily as needed. For dry skin    Historical Provider, MD  Multiple Vitamin (MULTIVITAMIN) tablet Take 1 tablet by mouth every morning.     Historical Provider, MD  pravastatin (PRAVACHOL) 40 MG tablet Take 40 mg by mouth every morning.     Historical Provider, MD  RABEprazole (ACIPHEX) 20 MG tablet Take 20 mg by mouth every morning.     Historical Provider, MD  Travoprost, BAK Free, (TRAVATAN) 0.004 % SOLN ophthalmic solution Place 1 drop into both eyes at bedtime.    Historical Provider, MD  Vitamin D, Ergocalciferol, (DRISDOL) 50000 UNITS CAPS capsule Take 50,000 Units by mouth every 7 (seven) days. Takes on Sat    Historical Provider, MD   BP 144/58  Pulse 70  Temp(Src) 97.7 F (36.5 C) (Oral)  Resp 18  SpO2 100%  Physical Exam  Nursing note and vitals reviewed. Constitutional: She is oriented to person, place, and time. She appears well-developed and well-nourished.  HENT:  Head: Normocephalic. Head is with laceration.    Mouth/Throat: Oropharynx is clear and moist.  3 cm laceration above left eyebrow, no active bleeding, no associated hematoma, no FB or signs of infection  Eyes: Conjunctivae and EOM are normal. Pupils are equal, round, and reactive to light.  Neck: Normal range of motion.  Cardiovascular: Normal rate, regular rhythm and normal heart sounds.   Pulmonary/Chest: Effort normal and breath sounds normal. No respiratory distress. She has no wheezes.  Abdominal: Soft. Bowel sounds are normal. There is no tenderness. There is no  guarding.  Musculoskeletal: She exhibits no edema.       Left wrist: She exhibits decreased range of motion, tenderness, bony tenderness and swelling.       Left hand: She exhibits decreased range of motion, tenderness, bony tenderness and swelling. She exhibits no deformity. Normal strength noted.  Left wrist and hand swollen and tender to palpation without gross deformity; limited range of motion due to pain and swelling; strong radial pulse and cap refill; sensation intact diffusely throughout hand; moving all finger appropriately  Neurological: She is alert and oriented to person, place, and time.  Skin: Skin is warm and dry.  Psychiatric: She has a normal mood and affect.    ED Course  Procedures (including critical care time)  LACERATION REPAIR Performed by: Larene Pickett Authorized by: Larene Pickett Consent: Verbal consent obtained. Risks and benefits: risks, benefits and alternatives were discussed Consent given by: patient Patient identity confirmed: provided demographic data Prepped and Draped in normal sterile fashion Wound explored  Laceration Location: left forehead  Laceration Length: 3cm  No Foreign Bodies seen or palpated  Anesthesia: local infiltration  Local anesthetic: lidocaine 1% with epinephrine  Anesthetic total: 6 ml  Irrigation method: syringe Amount of cleaning: standard  Skin closure: 4-0 prolene  Number of sutures: 4  Technique: simple interrupted  Patient tolerance: Patient tolerated the procedure well with no immediate complications.  Labs Review Labs Reviewed - No data to display  Imaging Review Dg Wrist Complete Left  07/23/2013   CLINICAL DATA:  Fall, left wrist pain  EXAM: LEFT WRIST - COMPLETE 3+ VIEW  COMPARISON:  None.  FINDINGS: Impacted distal radial metaphyseal fracture.  No definite intra-articular extension.  Mild degenerative changes involving the 1st carpometacarpal joint.  Mild soft tissue swelling.  IMPRESSION: Impacted  distal radial fracture.   Electronically Signed   By: Julian Hy M.D.   On: 07/23/2013 19:04   Ct  Head Wo Contrast  07/23/2013   CLINICAL DATA:  Fall, head injury, laceration over left eyebrow  EXAM: CT HEAD WITHOUT CONTRAST  CT CERVICAL SPINE WITHOUT CONTRAST  TECHNIQUE: Multidetector CT imaging of the head and cervical spine was performed following the standard protocol without intravenous contrast. Multiplanar CT image reconstructions of the cervical spine were also generated.  COMPARISON:  CT head dated 10/30/2012  FINDINGS: CT HEAD FINDINGS  No evidence of parenchymal hemorrhage or extra-axial fluid collection. No mass lesion, mass effect, or midline shift.  No CT evidence of acute infarction.  Subcortical white matter and periventricular small vessel ischemic changes.  Mild age related atrophy.  No ventriculomegaly.  The visualized paranasal sinuses are essentially clear. The mastoid air cells are unopacified.  Mild soft tissue swelling/laceration overlying the left frontal bone (series 22/image 42).  No evidence of calvarial fracture.  CT CERVICAL SPINE FINDINGS  Exaggerated cervical lordosis.  No evidence of fracture or dislocation. Vertebral body heights and intervertebral disc spaces are maintained. Dens appears intact.  No prevertebral soft tissue swelling.  Visualized thyroid is mildly heterogeneous/ nodular.  Visualized lung apices are essentially clear.  IMPRESSION: Mild soft tissue swelling/laceration overlying the left frontal bone. No evidence of calvarial fracture.  No evidence of acute intracranial abnormality.  No evidence of traumatic injury to the cervical spine.   Electronically Signed   By: Julian Hy M.D.   On: 07/23/2013 18:53   Ct Cervical Spine Wo Contrast  07/23/2013   CLINICAL DATA:  Fall, head injury, laceration over left eyebrow  EXAM: CT HEAD WITHOUT CONTRAST  CT CERVICAL SPINE WITHOUT CONTRAST  TECHNIQUE: Multidetector CT imaging of the head and cervical spine was  performed following the standard protocol without intravenous contrast. Multiplanar CT image reconstructions of the cervical spine were also generated.  COMPARISON:  CT head dated 10/30/2012  FINDINGS: CT HEAD FINDINGS  No evidence of parenchymal hemorrhage or extra-axial fluid collection. No mass lesion, mass effect, or midline shift.  No CT evidence of acute infarction.  Subcortical white matter and periventricular small vessel ischemic changes.  Mild age related atrophy.  No ventriculomegaly.  The visualized paranasal sinuses are essentially clear. The mastoid air cells are unopacified.  Mild soft tissue swelling/laceration overlying the left frontal bone (series 22/image 42).  No evidence of calvarial fracture.  CT CERVICAL SPINE FINDINGS  Exaggerated cervical lordosis.  No evidence of fracture or dislocation. Vertebral body heights and intervertebral disc spaces are maintained. Dens appears intact.  No prevertebral soft tissue swelling.  Visualized thyroid is mildly heterogeneous/ nodular.  Visualized lung apices are essentially clear.  IMPRESSION: Mild soft tissue swelling/laceration overlying the left frontal bone. No evidence of calvarial fracture.  No evidence of acute intracranial abnormality.  No evidence of traumatic injury to the cervical spine.   Electronically Signed   By: Julian Hy M.D.   On: 07/23/2013 18:53   Dg Hand Complete Left  07/23/2013   CLINICAL DATA:  Fall, left wrist pain  EXAM: LEFT HAND - COMPLETE 3+ VIEW  COMPARISON:  None.  FINDINGS: No fracture or dislocation is seen in the hand. Transverse/impacted distal radial fracture.  The joint spaces are preserved.  The visualized soft tissues are unremarkable.  IMPRESSION: Impacted distal radial fracture.   Electronically Signed   By: Julian Hy M.D.   On: 07/23/2013 19:03     EKG Interpretation None      MDM   Final diagnoses:  Fall at home  Fracture of radius, distal,  left, closed  Laceration of head    80 year old female status post fall at home presenting with left wrist and hand pain. She sustained a laceration above her left eyebrow. Given her use of anticoagulants, we'll obtain CT head and neck as well as plain films of left wrist and hand.  Tetanus UTD.  CT head and C-spine negative. Patient with impacted left distal radius fracture.  Attempted to discuss case with hand surgery but pages were not returned.  Pt was placed in sugar tong splint with shoulder sling and given hand surgery follow-up.  She will FU with her PCP in 1 week for suture removal.  Instructed on home wound care.  Discussed plan with patient, he/she acknowledged understanding and agreed with plan of care.  Return precautions given for new or worsening symptoms.  Larene Pickett, PA-C 07/23/13 2025

## 2013-07-24 DIAGNOSIS — S335XXA Sprain of ligaments of lumbar spine, initial encounter: Secondary | ICD-10-CM | POA: Diagnosis not present

## 2013-07-24 NOTE — ED Provider Notes (Signed)
80 y.o. Female with mechanical fall with left wrist fracture and head laceration.  Left wrist splinted and remains neurovascularly intact.   I performed a history and physical examination of [name] and discussed her management with Ms. Sanders.  I agree with the history, physical, assessment, and plan of care, with the following exceptions: None  I was present for the following procedures: None Time Spent in Critical Care of the patient: None Time spent in discussions with the patient and family: [place], MD 07/24/13 1843

## 2013-07-25 ENCOUNTER — Emergency Department (HOSPITAL_COMMUNITY): Payer: Medicare Other

## 2013-07-25 ENCOUNTER — Emergency Department (HOSPITAL_COMMUNITY)
Admission: EM | Admit: 2013-07-25 | Discharge: 2013-07-25 | Disposition: A | Discharge status: Home / Self Care | Payer: Medicare Other | Attending: Emergency Medicine | Admitting: Emergency Medicine

## 2013-07-25 ENCOUNTER — Encounter (HOSPITAL_COMMUNITY): Payer: Self-pay | Admitting: Emergency Medicine

## 2013-07-25 DIAGNOSIS — Z86711 Personal history of pulmonary embolism: Secondary | ICD-10-CM | POA: Diagnosis not present

## 2013-07-25 DIAGNOSIS — E039 Hypothyroidism, unspecified: Secondary | ICD-10-CM | POA: Insufficient documentation

## 2013-07-25 DIAGNOSIS — F411 Generalized anxiety disorder: Secondary | ICD-10-CM | POA: Diagnosis not present

## 2013-07-25 DIAGNOSIS — Z79899 Other long term (current) drug therapy: Secondary | ICD-10-CM | POA: Insufficient documentation

## 2013-07-25 DIAGNOSIS — T1490XA Injury, unspecified, initial encounter: Secondary | ICD-10-CM | POA: Diagnosis not present

## 2013-07-25 DIAGNOSIS — G500 Trigeminal neuralgia: Secondary | ICD-10-CM | POA: Diagnosis not present

## 2013-07-25 DIAGNOSIS — M810 Age-related osteoporosis without current pathological fracture: Secondary | ICD-10-CM | POA: Diagnosis not present

## 2013-07-25 DIAGNOSIS — S0180XA Unspecified open wound of other part of head, initial encounter: Secondary | ICD-10-CM | POA: Diagnosis not present

## 2013-07-25 DIAGNOSIS — Z8673 Personal history of transient ischemic attack (TIA), and cerebral infarction without residual deficits: Secondary | ICD-10-CM | POA: Insufficient documentation

## 2013-07-25 DIAGNOSIS — M129 Arthropathy, unspecified: Secondary | ICD-10-CM | POA: Diagnosis not present

## 2013-07-25 DIAGNOSIS — M1990 Unspecified osteoarthritis, unspecified site: Secondary | ICD-10-CM | POA: Diagnosis not present

## 2013-07-25 DIAGNOSIS — K219 Gastro-esophageal reflux disease without esophagitis: Secondary | ICD-10-CM | POA: Insufficient documentation

## 2013-07-25 DIAGNOSIS — G43909 Migraine, unspecified, not intractable, without status migrainosus: Secondary | ICD-10-CM | POA: Diagnosis not present

## 2013-07-25 DIAGNOSIS — I4891 Unspecified atrial fibrillation: Secondary | ICD-10-CM | POA: Diagnosis not present

## 2013-07-25 DIAGNOSIS — E119 Type 2 diabetes mellitus without complications: Secondary | ICD-10-CM | POA: Insufficient documentation

## 2013-07-25 DIAGNOSIS — Z880 Allergy status to penicillin: Secondary | ICD-10-CM | POA: Insufficient documentation

## 2013-07-25 DIAGNOSIS — N183 Chronic kidney disease, stage 3 unspecified: Secondary | ICD-10-CM | POA: Diagnosis not present

## 2013-07-25 DIAGNOSIS — F0390 Unspecified dementia without behavioral disturbance: Secondary | ICD-10-CM | POA: Insufficient documentation

## 2013-07-25 DIAGNOSIS — I129 Hypertensive chronic kidney disease with stage 1 through stage 4 chronic kidney disease, or unspecified chronic kidney disease: Secondary | ICD-10-CM | POA: Diagnosis not present

## 2013-07-25 DIAGNOSIS — H409 Unspecified glaucoma: Secondary | ICD-10-CM | POA: Diagnosis not present

## 2013-07-25 DIAGNOSIS — Z8619 Personal history of other infectious and parasitic diseases: Secondary | ICD-10-CM | POA: Diagnosis not present

## 2013-07-25 DIAGNOSIS — M5430 Sciatica, unspecified side: Secondary | ICD-10-CM | POA: Insufficient documentation

## 2013-07-25 DIAGNOSIS — M1710 Unilateral primary osteoarthritis, unspecified knee: Secondary | ICD-10-CM | POA: Insufficient documentation

## 2013-07-25 DIAGNOSIS — E559 Vitamin D deficiency, unspecified: Secondary | ICD-10-CM | POA: Diagnosis not present

## 2013-07-25 DIAGNOSIS — S79929A Unspecified injury of unspecified thigh, initial encounter: Secondary | ICD-10-CM | POA: Diagnosis not present

## 2013-07-25 DIAGNOSIS — E7800 Pure hypercholesterolemia, unspecified: Secondary | ICD-10-CM | POA: Insufficient documentation

## 2013-07-25 DIAGNOSIS — I10 Essential (primary) hypertension: Secondary | ICD-10-CM | POA: Diagnosis not present

## 2013-07-25 DIAGNOSIS — Z86718 Personal history of other venous thrombosis and embolism: Secondary | ICD-10-CM | POA: Diagnosis not present

## 2013-07-25 DIAGNOSIS — IMO0002 Reserved for concepts with insufficient information to code with codable children: Secondary | ICD-10-CM | POA: Insufficient documentation

## 2013-07-25 DIAGNOSIS — Z7901 Long term (current) use of anticoagulants: Secondary | ICD-10-CM | POA: Insufficient documentation

## 2013-07-25 DIAGNOSIS — S79919A Unspecified injury of unspecified hip, initial encounter: Secondary | ICD-10-CM | POA: Diagnosis not present

## 2013-07-25 NOTE — Discharge Instructions (Signed)
Sciatica °Sciatica is pain, weakness, numbness, or tingling along the path of the sciatic nerve. The nerve starts in the lower back and runs down the back of each leg. The nerve controls the muscles in the lower leg and in the back of the knee, while also providing sensation to the back of the thigh, lower leg, and the sole of your foot. Sciatica is a symptom of another medical condition. For instance, nerve damage or certain conditions, such as a herniated disk or bone spur on the spine, pinch or put pressure on the sciatic nerve. This causes the pain, weakness, or other sensations normally associated with sciatica. Generally, sciatica only affects one side of the body. °CAUSES  °· Herniated or slipped disc. °· Degenerative disk disease. °· A pain disorder involving the narrow muscle in the buttocks (piriformis syndrome). °· Pelvic injury or fracture. °· Pregnancy. °· Tumor (rare). °SYMPTOMS  °Symptoms can vary from mild to very severe. The symptoms usually travel from the low back to the buttocks and down the back of the leg. Symptoms can include: °· Mild tingling or dull aches in the lower back, leg, or hip. °· Numbness in the back of the calf or sole of the foot. °· Burning sensations in the lower back, leg, or hip. °· Sharp pains in the lower back, leg, or hip. °· Leg weakness. °· Severe back pain inhibiting movement. °These symptoms may get worse with coughing, sneezing, laughing, or prolonged sitting or standing. Also, being overweight may worsen symptoms. °DIAGNOSIS  °Your caregiver will perform a physical exam to look for common symptoms of sciatica. He or she may ask you to do certain movements or activities that would trigger sciatic nerve pain. Other tests may be performed to find the cause of the sciatica. These may include: °· Blood tests. °· X-rays. °· Imaging tests, such as an MRI or CT scan. °TREATMENT  °Treatment is directed at the cause of the sciatic pain. Sometimes, treatment is not necessary  and the pain and discomfort goes away on its own. If treatment is needed, your caregiver may suggest: °· Over-the-counter medicines to relieve pain. °· Prescription medicines, such as anti-inflammatory medicine, muscle relaxants, or narcotics. °· Applying heat or ice to the painful area. °· Steroid injections to lessen pain, irritation, and inflammation around the nerve. °· Reducing activity during periods of pain. °· Exercising and stretching to strengthen your abdomen and improve flexibility of your spine. Your caregiver may suggest losing weight if the extra weight makes the back pain worse. °· Physical therapy. °· Surgery to eliminate what is pressing or pinching the nerve, such as a bone spur or part of a herniated disk. °HOME CARE INSTRUCTIONS  °· Only take over-the-counter or prescription medicines for pain or discomfort as directed by your caregiver. °· Apply ice to the affected area for 20 minutes, 3 4 times a day for the first 48 72 hours. Then try heat in the same way. °· Exercise, stretch, or perform your usual activities if these do not aggravate your pain. °· Attend physical therapy sessions as directed by your caregiver. °· Keep all follow-up appointments as directed by your caregiver. °· Do not wear high heels or shoes that do not provide proper support. °· Check your mattress to see if it is too soft. A firm mattress may lessen your pain and discomfort. °SEEK IMMEDIATE MEDICAL CARE IF:  °· You lose control of your bowel or bladder (incontinence). °· You have increasing weakness in the lower back,   pelvis, buttocks, or legs. °· You have redness or swelling of your back. °· You have a burning sensation when you urinate. °· You have pain that gets worse when you lie down or awakens you at night. °· Your pain is worse than you have experienced in the past. °· Your pain is lasting longer than 4 weeks. °· You are suddenly losing weight without reason. °MAKE SURE YOU: °· Understand these  instructions. °· Will watch your condition. °· Will get help right away if you are not doing well or get worse. °Document Released: 03/05/2001 Document Revised: 09/10/2011 Document Reviewed: 07/21/2011 °[employer]® Patient Information ©2014 [employer]. ° °

## 2013-07-25 NOTE — ED Provider Notes (Addendum)
TIME SEEN: 11:15 AM  CHIEF COMPLAINT: Right buttocks pain, back pain  HPI: Patient is a 80 y.o. F with history of diabetes, hypertension, A. fib, pulmonary embolus currently on Lovenox and status post IVC filter who had a mechanical fall at home on 07/23/13 when she tripped over a basket. She was seen in the emergency department and found to have an impacted left distal radius fracture. Her head and cervical spine CTs are negative. She had a laceration above her left eyebrow that was repaired. She states since that time she has had some pain in her right buttock that radiates into her right posterior leg. It is worse with ambulation. She states she was discharged from the ER with Tylenol 3 which has been helping with her pain. She was seen at urgent care yesterday and given stronger pain medication. They did not do any further imaging at this time. She returns the emergency room she states that she missed a dose of pain medicine last night and has had worsening pain. She took a pain pill prior to coming to the emergency room and states she feels better. No numbness or focal weakness. No chest pain or shortness of breath. No abdominal pain. She denies any bowel or bladder incontinence, urinary retention.  ROS: See HPI Constitutional: no fever  Eyes: no drainage  ENT: no runny nose   Cardiovascular:  no chest pain  Resp: no SOB  GI: no vomiting GU: no dysuria Integumentary: no rash  Allergy: no hives  Musculoskeletal: no leg swelling  Neurological: no slurred speech ROS otherwise negative  PAST MEDICAL HISTORY/PAST SURGICAL HISTORY:  Past Medical History  Diagnosis Date  . Shingles     zoster 1990 on the back and on the abdomen in 2007,facial zoster 2006  . Hypertension   . A-fib   . Diabetes mellitus without complication   . Glaucoma   . Pulmonary embolism recurrent     IVC filter  . Acid reflux   . Arthritis   . Migraine   . Postherpetic trigeminal neuralgia 10/20/2012  . Unspecified  cerebral artery occlusion with cerebral infarction   . Memory loss   . Chronic anticoagulation 03/07/2013    Lovenox 100 QD  . Junctional bradycardia     resolved with discontinuation of sotalol (2014) after 10 yrs  . DVT (deep venous thrombosis) 2007    recurrent w PE s/p [place] filter  . Hypoglycemia   . GERD (gastroesophageal reflux disease)     w hiatal hernia endoscopy 2003, Dr Penelope Coop  . Diverticulosis   . Phlebitis   . Osteoarthritis     in Bilateral knees and back. -Dr Latanya Maudlin  . Panic attacks   . Anxiety   . IBS (irritable bowel syndrome)   . Hypothyroidism     goiter  . OSA (obstructive sleep apnea) Dx: 2008    could never use CPAP repeat study showed no sleep apnea (HST 03-03-13 ESS 8, AHI 2/hr)  . Vitamin D deficiency   . CVA (cerebral infarction) 1964  . Osteoporosis   . Trigeminal neuralgia     r face--Dr Dohmeier  . Hypercholesterolemia   . Chronic kidney disease     Stage III  . Dementia     Dr Dohmeier    MEDICATIONS:  Prior to Admission medications   Medication Sig Start Date End Date Taking? Authorizing Provider  acetaminophen-codeine (TYLENOL #3) 300-30 MG per tablet Take 1 tablet by mouth every 6 (six) hours as needed for moderate pain.  07/23/13   Larene Pickett, PA-C  amLODipine (NORVASC) 5 MG tablet Take 1 tablet (5 mg total) by mouth daily. 05/25/13   Belva Crome III, MD  CARBATROL 100 MG 12 hr capsule TAKE THREE CAPSULES BY MOUTH EVERY MORNING, TWO CAPSULES AT NOON, AND THREE CAPSULES AT BEDTIME. TAKE AFTER MEALS. 04/19/13   Larey Seat, MD  enoxaparin (LOVENOX) 100 MG/ML injection Inject 100 mg into the skin at bedtime.     Historical Provider, MD  EPINEPHrine (EPIPEN IJ) Inject as directed once as needed. For allergic reactions    Historical Provider, MD  Fe Fum-FA-B Cmp-C-Zn-Mg-Mn-Cu (HEMOCYTE PLUS) 106-1 MG CAPS Take 1 capsule by mouth daily.     Historical Provider, MD  folic acid (FOLVITE) 1 MG tablet Take 1 mg by mouth every morning.      Historical Provider, MD  furosemide (LASIX) 20 MG tablet Take 20 mg by mouth as needed for fluid.  03/16/13   Historical Provider, MD  gabapentin (NEURONTIN) 300 MG capsule Take 300 mg by mouth at bedtime.    Historical Provider, MD  levothyroxine (SYNTHROID, LEVOTHROID) 75 MCG tablet Take 75 mcg by mouth daily before breakfast.     Historical Provider, MD  loratadine (CLARITIN) 10 MG tablet Take 10 mg by mouth daily.    Historical Provider, MD  meclizine (ANTIVERT) 12.5 MG tablet Take 12.5 mg by mouth 3 (three) times daily as needed. For dizziness    Historical Provider, MD  Menthol (EUCERIN SKIN CALMING) 0.1 % LOTN Apply 1 application topically daily as needed. For dry skin    Historical Provider, MD  Multiple Vitamin (MULTIVITAMIN) tablet Take 1 tablet by mouth every morning.     Historical Provider, MD  pravastatin (PRAVACHOL) 40 MG tablet Take 40 mg by mouth every morning.     Historical Provider, MD  RABEprazole (ACIPHEX) 20 MG tablet Take 20 mg by mouth every morning.     Historical Provider, MD  Travoprost, BAK Free, (TRAVATAN) 0.004 % SOLN ophthalmic solution Place 1 drop into both eyes at bedtime.    Historical Provider, MD  Vitamin D, Ergocalciferol, (DRISDOL) 50000 UNITS CAPS capsule Take 50,000 Units by mouth every 7 (seven) days. Takes on Sat    Historical Provider, MD    ALLERGIES:  Allergies  Allergen Reactions  . Ambien [Zolpidem Tartrate]     A high, hives  . Benadryl [Diphenhydramine Hcl] Hives and Shortness Of Breath    hives  . Betadine Antibiotic-Moisturize [Bacitracin-Polymyxin B] Rash  . Biaxin [Clarithromycin]     hives  . Celebrex [Celecoxib] Rash  . Ciprofloxacin Hcl     rash  . Combigan [Brimonidine Tartrate-Timolol]   . Coumadin [Warfarin Sodium]     rash  . Coumarin   . Eye Drops [Tetrahydrazoline Hcl]     Burning, itching, swelling  . Flu Virus Vaccine     Short of breath  . Ivp Dye [Iodinated Diagnostic Agents]     hives  . Levaquin [Levofloxacin  In D5w]     rash  . Naprosyn [Naproxen]     rash  . Pataday [Olopatadine Hcl]   . Penicillins     hives  . Plavix [Clopidogrel Bisulfate]     rash  . Septra [Sulfamethoxazole-Tmp Ds]     hives  . Shrimp [Shellfish Allergy] Anaphylaxis    anaphylaxis  . Voltaren [Diclofenac Sodium]     reflux  . Gluten Meal Diarrhea  . Iohexol      Code: HIVES, Desc: PT  IS ALLERGIC TO IVP DYE/IODINE/SHRIMP.  CAUSES SOB AND HIVES PER PT.  STEPHANIE, RT-R, Onset Date: [id number]     SOCIAL HISTORY:  History  Substance Use Topics  . Smoking status: Never Smoker   . Smokeless tobacco: Never Used  . Alcohol Use: Yes     Comment: very moderate, none  in the last two months    FAMILY HISTORY: Family History  Problem Relation Age of Onset  . Diabetes Mother   . Hypertension Mother   . Glaucoma Mother   . CVA Mother   . CAD Mother   . Hypertension Father   . Heart attack Father   . Kidney failure Father   . Heart attack Brother     2 half brothers  . Hypertension Brother   . CVA Brother   . Glaucoma Other   . Glaucoma Other   . Breast cancer Maternal Aunt   . Colon cancer Maternal Uncle   . Rectal cancer Maternal Uncle   . Prostate cancer Maternal Grandfather     EXAM: BP 132/52  Pulse 75  Temp(Src) 98.7 F (37.1 C) (Oral)  Resp 18  SpO2 92% CONSTITUTIONAL: Alert and oriented and responds appropriately to questions. Well-appearing; well-nourished; GCS 15 HEAD: Normocephalic; patient has some left periorbital ecchymosis and a repaired laceration to her left eyebrow that is clean, dry and intact EYES: Conjunctivae clear, PERRL, EOMI ENT: normal nose; no rhinorrhea; moist mucous membranes; pharynx without lesions noted; no dental injury;  no septal hematoma NECK: Supple, no meningismus, no LAD; no midline spinal tenderness, step-off or deformity CARD: RRR; S1 and S2 appreciated; no murmurs, no clicks, no rubs, no gallops RESP: Normal chest excursion without splinting or tachypnea;  breath sounds clear and equal bilaterally; no wheezes, no rhonchi, no rales; chest wall stable, nontender to palpation ABD/GI: Normal bowel sounds; non-distended; soft, non-tender, no rebound, no guarding PELVIS:  stable, nontender to palpation BACK:  The back appears normal and is non-tender to palpation, there is no CVA tenderness; patient has some mild tenderness over her lower lumbar spine with no step-off or deformity EXT: Patient is tender to palpation over her left buttock and lower lumbar region with no step-off or deformity, no leg length discrepancy, 2+ DP pulses bilaterally, patient has a splint on her left upper extremity with normal capillary refill of her fingers and normal range of motion of her fingers and elbow, Normal ROM in all joints; otherwise extremities are non-tender to palpation; no edema; normal capillary refill; no cyanosis    SKIN: Normal color for age and race; warm NEURO: Moves all extremities equally, sensation to light-touch intact diffusely, cranial nerves II through XII intact PSYCH: The patient's mood and manner are appropriate. Grooming and personal hygiene are appropriate.  MEDICAL DECISION MAKING: Patient here complaining of right buttock pain and lower back pain after a fall 2 days ago. She is otherwise neurologically intact, hemodynamically stable. She is alert he had CT imaging of her head and cervical spine which were negative. She was found to have a left distal radius impacted fracture and is currently in a splint. We'll obtain x-rays of her lumbar spine and right hip. Low concern for fracture at this time. Likely a sciatic pain. She denies wanting any pain medicine at this time.  ED PROGRESS: Patient's pain is still controlled. She is able to ambulate without difficulty. Suspect her symptoms are due to sciatica. Her x-rays are unremarkable. We'll discharge home. She states she has hydrocodone to take for  her pain at home which has been helping. Have given  strict return precautions. Patient verbalizes understanding and is comfortable with plan.     [place], DO 07/25/13 Lamar Ermagene Saidi, DO 07/25/13 1333

## 2013-07-25 NOTE — ED Notes (Signed)
Per EMS - pt c/o right buttocks pain, has had it all night long and had been taking hydrocodone Q4H for the pain and it was giving her relief. Then this morning while trying to use the restroom the pain increased so she came to get it checked out. No bruising, no radiation. Pt just had a fall on Thursday that caused her to break her left wrist. Pt on Lovenox of hx of PE x 3. BP 138/88 HR 80 NSR. sts she hasn't had time to set up a follow up appointment with the ortho doctor yet.

## 2013-07-25 NOTE — ED Notes (Signed)
Ambulated pt to [facility] 2 times. Pt was slow and weak but was able to stand and get to the [facility].

## 2013-07-26 DIAGNOSIS — M545 Low back pain, unspecified: Secondary | ICD-10-CM | POA: Diagnosis not present

## 2013-07-26 DIAGNOSIS — S0100XA Unspecified open wound of scalp, initial encounter: Secondary | ICD-10-CM | POA: Diagnosis not present

## 2013-07-26 DIAGNOSIS — M810 Age-related osteoporosis without current pathological fracture: Secondary | ICD-10-CM | POA: Diagnosis not present

## 2013-07-26 DIAGNOSIS — M25539 Pain in unspecified wrist: Secondary | ICD-10-CM | POA: Diagnosis not present

## 2013-07-26 DIAGNOSIS — S5290XA Unspecified fracture of unspecified forearm, initial encounter for closed fracture: Secondary | ICD-10-CM | POA: Diagnosis not present

## 2013-07-26 DIAGNOSIS — N390 Urinary tract infection, site not specified: Secondary | ICD-10-CM | POA: Diagnosis not present

## 2013-08-02 DIAGNOSIS — R404 Transient alteration of awareness: Secondary | ICD-10-CM | POA: Diagnosis not present

## 2013-08-02 DIAGNOSIS — K5900 Constipation, unspecified: Secondary | ICD-10-CM | POA: Diagnosis not present

## 2013-08-02 DIAGNOSIS — M545 Low back pain, unspecified: Secondary | ICD-10-CM | POA: Diagnosis not present

## 2013-08-02 DIAGNOSIS — R5381 Other malaise: Secondary | ICD-10-CM | POA: Diagnosis not present

## 2013-08-02 DIAGNOSIS — N390 Urinary tract infection, site not specified: Secondary | ICD-10-CM | POA: Diagnosis not present

## 2013-08-03 ENCOUNTER — Encounter (HOSPITAL_COMMUNITY): Payer: Self-pay | Admitting: Emergency Medicine

## 2013-08-03 ENCOUNTER — Emergency Department (HOSPITAL_COMMUNITY): Payer: Medicare Other

## 2013-08-03 ENCOUNTER — Observation Stay (HOSPITAL_COMMUNITY): Payer: Medicare Other

## 2013-08-03 ENCOUNTER — Observation Stay (HOSPITAL_COMMUNITY)
Admission: EM | Admit: 2013-08-03 | Discharge: 2013-08-07 | Disposition: A | Discharge status: Home Health | Payer: Medicare Other | Attending: Internal Medicine | Admitting: Internal Medicine

## 2013-08-03 DIAGNOSIS — F410 Panic disorder [episodic paroxysmal anxiety] without agoraphobia: Secondary | ICD-10-CM | POA: Diagnosis not present

## 2013-08-03 DIAGNOSIS — N183 Chronic kidney disease, stage 3 unspecified: Secondary | ICD-10-CM | POA: Diagnosis not present

## 2013-08-03 DIAGNOSIS — R413 Other amnesia: Secondary | ICD-10-CM | POA: Diagnosis not present

## 2013-08-03 DIAGNOSIS — M545 Low back pain, unspecified: Secondary | ICD-10-CM | POA: Diagnosis not present

## 2013-08-03 DIAGNOSIS — I480 Paroxysmal atrial fibrillation: Secondary | ICD-10-CM

## 2013-08-03 DIAGNOSIS — E7800 Pure hypercholesterolemia, unspecified: Secondary | ICD-10-CM | POA: Diagnosis not present

## 2013-08-03 DIAGNOSIS — M1710 Unilateral primary osteoarthritis, unspecified knee: Secondary | ICD-10-CM | POA: Diagnosis not present

## 2013-08-03 DIAGNOSIS — I129 Hypertensive chronic kidney disease with stage 1 through stage 4 chronic kidney disease, or unspecified chronic kidney disease: Secondary | ICD-10-CM | POA: Insufficient documentation

## 2013-08-03 DIAGNOSIS — M549 Dorsalgia, unspecified: Secondary | ICD-10-CM

## 2013-08-03 DIAGNOSIS — K219 Gastro-esophageal reflux disease without esophagitis: Secondary | ICD-10-CM | POA: Diagnosis not present

## 2013-08-03 DIAGNOSIS — S3992XA Unspecified injury of lower back, initial encounter: Secondary | ICD-10-CM

## 2013-08-03 DIAGNOSIS — I4891 Unspecified atrial fibrillation: Secondary | ICD-10-CM | POA: Diagnosis not present

## 2013-08-03 DIAGNOSIS — S298XXA Other specified injuries of thorax, initial encounter: Secondary | ICD-10-CM | POA: Diagnosis not present

## 2013-08-03 DIAGNOSIS — F0390 Unspecified dementia without behavioral disturbance: Secondary | ICD-10-CM | POA: Insufficient documentation

## 2013-08-03 DIAGNOSIS — K5730 Diverticulosis of large intestine without perforation or abscess without bleeding: Secondary | ICD-10-CM | POA: Insufficient documentation

## 2013-08-03 DIAGNOSIS — S42309D Unspecified fracture of shaft of humerus, unspecified arm, subsequent encounter for fracture with routine healing: Secondary | ICD-10-CM | POA: Insufficient documentation

## 2013-08-03 DIAGNOSIS — Z9181 History of falling: Secondary | ICD-10-CM | POA: Insufficient documentation

## 2013-08-03 DIAGNOSIS — G43909 Migraine, unspecified, not intractable, without status migrainosus: Secondary | ICD-10-CM | POA: Insufficient documentation

## 2013-08-03 DIAGNOSIS — Z86718 Personal history of other venous thrombosis and embolism: Secondary | ICD-10-CM | POA: Insufficient documentation

## 2013-08-03 DIAGNOSIS — M62838 Other muscle spasm: Secondary | ICD-10-CM | POA: Diagnosis not present

## 2013-08-03 DIAGNOSIS — R0602 Shortness of breath: Secondary | ICD-10-CM | POA: Insufficient documentation

## 2013-08-03 DIAGNOSIS — F411 Generalized anxiety disorder: Secondary | ICD-10-CM | POA: Diagnosis not present

## 2013-08-03 DIAGNOSIS — S42302A Unspecified fracture of shaft of humerus, left arm, initial encounter for closed fracture: Secondary | ICD-10-CM

## 2013-08-03 DIAGNOSIS — IMO0002 Reserved for concepts with insufficient information to code with codable children: Secondary | ICD-10-CM

## 2013-08-03 DIAGNOSIS — K449 Diaphragmatic hernia without obstruction or gangrene: Secondary | ICD-10-CM | POA: Insufficient documentation

## 2013-08-03 DIAGNOSIS — M25559 Pain in unspecified hip: Secondary | ICD-10-CM | POA: Diagnosis not present

## 2013-08-03 DIAGNOSIS — M810 Age-related osteoporosis without current pathological fracture: Secondary | ICD-10-CM | POA: Diagnosis not present

## 2013-08-03 DIAGNOSIS — E222 Syndrome of inappropriate secretion of antidiuretic hormone: Secondary | ICD-10-CM

## 2013-08-03 DIAGNOSIS — Z8673 Personal history of transient ischemic attack (TIA), and cerebral infarction without residual deficits: Secondary | ICD-10-CM | POA: Diagnosis not present

## 2013-08-03 DIAGNOSIS — E559 Vitamin D deficiency, unspecified: Secondary | ICD-10-CM | POA: Diagnosis not present

## 2013-08-03 DIAGNOSIS — S42309A Unspecified fracture of shaft of humerus, unspecified arm, initial encounter for closed fracture: Secondary | ICD-10-CM

## 2013-08-03 DIAGNOSIS — E119 Type 2 diabetes mellitus without complications: Secondary | ICD-10-CM

## 2013-08-03 DIAGNOSIS — Z86711 Personal history of pulmonary embolism: Secondary | ICD-10-CM | POA: Diagnosis not present

## 2013-08-03 DIAGNOSIS — I10 Essential (primary) hypertension: Secondary | ICD-10-CM

## 2013-08-03 DIAGNOSIS — G4733 Obstructive sleep apnea (adult) (pediatric): Secondary | ICD-10-CM | POA: Insufficient documentation

## 2013-08-03 DIAGNOSIS — S79919A Unspecified injury of unspecified hip, initial encounter: Secondary | ICD-10-CM | POA: Diagnosis not present

## 2013-08-03 DIAGNOSIS — H409 Unspecified glaucoma: Secondary | ICD-10-CM | POA: Insufficient documentation

## 2013-08-03 DIAGNOSIS — E236 Other disorders of pituitary gland: Principal | ICD-10-CM | POA: Insufficient documentation

## 2013-08-03 DIAGNOSIS — E039 Hypothyroidism, unspecified: Secondary | ICD-10-CM | POA: Diagnosis not present

## 2013-08-03 DIAGNOSIS — M47817 Spondylosis without myelopathy or radiculopathy, lumbosacral region: Secondary | ICD-10-CM | POA: Diagnosis not present

## 2013-08-03 DIAGNOSIS — Z7901 Long term (current) use of anticoagulants: Secondary | ICD-10-CM

## 2013-08-03 DIAGNOSIS — E860 Dehydration: Secondary | ICD-10-CM

## 2013-08-03 DIAGNOSIS — E871 Hypo-osmolality and hyponatremia: Secondary | ICD-10-CM

## 2013-08-03 LAB — HEPATIC FUNCTION PANEL
ALBUMIN: 3.7 g/dL (ref 3.5–5.2)
ALT: 10 U/L (ref 0–35)
AST: 16 U/L (ref 0–37)
Alkaline Phosphatase: 107 U/L (ref 39–117)
TOTAL PROTEIN: 7 g/dL (ref 6.0–8.3)
Total Bilirubin: 0.2 mg/dL — ABNORMAL LOW (ref 0.3–1.2)

## 2013-08-03 LAB — BASIC METABOLIC PANEL
BUN: 21 mg/dL (ref 6–23)
CALCIUM: 9 mg/dL (ref 8.4–10.5)
CO2: 27 meq/L (ref 19–32)
Chloride: 86 mEq/L — ABNORMAL LOW (ref 96–112)
Creatinine, Ser: 1.16 mg/dL — ABNORMAL HIGH (ref 0.50–1.10)
GFR calc Af Amer: 50 mL/min — ABNORMAL LOW (ref >90)
GFR calc non Af Amer: 43 mL/min — ABNORMAL LOW (ref >90)
Glucose, Bld: 112 mg/dL — ABNORMAL HIGH (ref 70–99)
Potassium: 4.5 mEq/L (ref 3.7–5.3)
Sodium: 124 mEq/L — ABNORMAL LOW (ref 137–147)

## 2013-08-03 LAB — URINALYSIS, ROUTINE W REFLEX MICROSCOPIC
BILIRUBIN URINE: NEGATIVE
Glucose, UA: NEGATIVE mg/dL
HGB URINE DIPSTICK: NEGATIVE
Ketones, ur: NEGATIVE mg/dL
Leukocytes, UA: NEGATIVE
Nitrite: NEGATIVE
PH: 7.5 (ref 5.0–8.0)
Protein, ur: NEGATIVE mg/dL
Specific Gravity, Urine: 1.011 (ref 1.005–1.030)
UROBILINOGEN UA: 0.2 mg/dL (ref 0.0–1.0)

## 2013-08-03 LAB — PROTIME-INR
INR: 1.08 (ref 0.00–1.49)
PROTHROMBIN TIME: 13.8 s (ref 11.6–15.2)

## 2013-08-03 LAB — CBC
HCT: 28 % — ABNORMAL LOW (ref 36.0–46.0)
HEMOGLOBIN: 9.6 g/dL — AB (ref 12.0–15.0)
MCH: 27.6 pg (ref 26.0–34.0)
MCHC: 34.3 g/dL (ref 30.0–36.0)
MCV: 80.5 fL (ref 78.0–100.0)
PLATELETS: 178 10*3/uL (ref 150–400)
RBC: 3.48 MIL/uL — AB (ref 3.87–5.11)
RDW: 16.9 % — ABNORMAL HIGH (ref 11.5–15.5)
WBC: 5.1 10*3/uL (ref 4.0–10.5)

## 2013-08-03 MED ORDER — HYDROCODONE-ACETAMINOPHEN 10-325 MG PO TABS: 1.0000 | ORAL_TABLET | Freq: Once | ORAL | Status: DC

## 2013-08-03 MED ORDER — SODIUM CHLORIDE 0.9 % IV SOLN
INTRAVENOUS | Status: AC
  Administered 2013-08-03: 23:00:00 via INTRAVENOUS

## 2013-08-03 MED ORDER — CARBAMAZEPINE ER 100 MG PO CP12: 200.0000 mg | ORAL_CAPSULE | Freq: Three times a day (TID) | ORAL | Status: DC

## 2013-08-03 MED ORDER — LORATADINE 10 MG PO TABS
10.0000 mg | ORAL_TABLET | Freq: Every day | ORAL | Status: DC
  Administered 2013-08-04 – 2013-08-07 (×4): 10 mg via ORAL
  Filled 2013-08-03 (×4): qty 1

## 2013-08-03 MED ORDER — AMLODIPINE BESYLATE 5 MG PO TABS
5.0000 mg | ORAL_TABLET | Freq: Every day | ORAL | Status: DC
  Administered 2013-08-05 – 2013-08-07 (×3): 5 mg via ORAL
  Filled 2013-08-03 (×4): qty 1

## 2013-08-03 MED ORDER — HYDROCODONE-ACETAMINOPHEN 5-325 MG PO TABS
1.0000 | ORAL_TABLET | Freq: Once | ORAL | Status: AC
  Administered 2013-08-03: 1 via ORAL
  Filled 2013-08-03: qty 1

## 2013-08-03 MED ORDER — SODIUM CHLORIDE 0.9 % IV SOLN: INTRAVENOUS | Status: DC

## 2013-08-03 MED ORDER — METAXALONE 400 MG HALF TABLET
400.0000 mg | ORAL_TABLET | Freq: Three times a day (TID) | ORAL | Status: DC
  Administered 2013-08-03: 400 mg via ORAL
  Administered 2013-08-04: 22:00:00 via ORAL
  Administered 2013-08-04 – 2013-08-06 (×7): 400 mg via ORAL
  Administered 2013-08-06: 12:00:00 via ORAL
  Administered 2013-08-07: 400 mg via ORAL
  Filled 2013-08-03 (×13): qty 1

## 2013-08-03 MED ORDER — ACETAMINOPHEN-CODEINE #3 300-30 MG PO TABS
1.0000 | ORAL_TABLET | Freq: Four times a day (QID) | ORAL | Status: DC | PRN
  Administered 2013-08-06 – 2013-08-07 (×3): 1 via ORAL
  Filled 2013-08-03 (×3): qty 1

## 2013-08-03 MED ORDER — LEVOTHYROXINE SODIUM 75 MCG PO TABS
75.0000 ug | ORAL_TABLET | Freq: Every day | ORAL | Status: DC
  Administered 2013-08-04 – 2013-08-07 (×4): 75 ug via ORAL
  Filled 2013-08-03 (×5): qty 1

## 2013-08-03 MED ORDER — VITAMIN D (ERGOCALCIFEROL) 1.25 MG (50000 UNIT) PO CAPS
50000.0000 [IU] | ORAL_CAPSULE | ORAL | Status: DC
  Administered 2013-08-07: 50000 [IU] via ORAL
  Filled 2013-08-03: qty 1

## 2013-08-03 MED ORDER — FOLIC ACID 1 MG PO TABS
1.0000 mg | ORAL_TABLET | Freq: Every morning | ORAL | Status: DC
  Administered 2013-08-04 – 2013-08-07 (×4): 1 mg via ORAL
  Filled 2013-08-03 (×4): qty 1

## 2013-08-03 MED ORDER — HYDROMORPHONE HCL PF 1 MG/ML IJ SOLN
1.0000 mg | INTRAMUSCULAR | Status: DC | PRN
  Administered 2013-08-04 – 2013-08-06 (×4): 1 mg via INTRAVENOUS
  Filled 2013-08-03 (×4): qty 1

## 2013-08-03 MED ORDER — HYDROCODONE-ACETAMINOPHEN 5-325 MG PO TABS
1.0000 | ORAL_TABLET | Freq: Four times a day (QID) | ORAL | Status: DC | PRN
  Administered 2013-08-04 – 2013-08-07 (×5): 1 via ORAL
  Filled 2013-08-03 (×5): qty 1

## 2013-08-03 MED ORDER — ENOXAPARIN SODIUM 100 MG/ML ~~LOC~~ SOLN
100.0000 mg | Freq: Every day | SUBCUTANEOUS | Status: DC
  Administered 2013-08-03 – 2013-08-06 (×4): 100 mg via SUBCUTANEOUS
  Filled 2013-08-03 (×5): qty 1

## 2013-08-03 MED ORDER — CARBAMAZEPINE ER 200 MG PO TB12
200.0000 mg | ORAL_TABLET | Freq: Every day | ORAL | Status: DC
  Administered 2013-08-04 – 2013-08-07 (×3): 200 mg via ORAL
  Filled 2013-08-03 (×4): qty 1

## 2013-08-03 MED ORDER — FUROSEMIDE 20 MG PO TABS
20.0000 mg | ORAL_TABLET | Freq: Every day | ORAL | Status: DC | PRN
  Filled 2013-08-03: qty 1

## 2013-08-03 MED ORDER — CARBAMAZEPINE ER 200 MG PO TB12
300.0000 mg | ORAL_TABLET | Freq: Two times a day (BID) | ORAL | Status: DC
  Administered 2013-08-03 – 2013-08-07 (×8): 300 mg via ORAL
  Filled 2013-08-03 (×10): qty 1

## 2013-08-03 MED ORDER — SODIUM CHLORIDE 0.9 % IV SOLN
INTRAVENOUS | Status: AC
  Administered 2013-08-03: 21:00:00 via INTRAVENOUS

## 2013-08-03 NOTE — ED Notes (Signed)
Patient transported to X-ray 

## 2013-08-03 NOTE — ED Provider Notes (Signed)
Medical screening examination/treatment/procedure(s) were performed by non-physician practitioner and as supervising physician I was immediately available for consultation/collaboration.   EKG Interpretation   Date/Time:  Tuesday Aug 03 2013 15:28:26 EDT Ventricular Rate:  77 PR Interval:  170 QRS Duration: 90 QT Interval:  370 QTC Calculation: 418 R Axis:   -20 Text Interpretation:  Normal sinus rhythm Left ventricular hypertrophy  Nonspecific T wave abnormality Abnormal ECG Confirmed by Alvino Chapel  MD,  Valyn Latchford [phone]) on 08/03/2013 3:32:38 PM       Jasper Riling. Alvino Chapel, MD 08/03/13 [phone]

## 2013-08-03 NOTE — H&P (Signed)
PCP:   Vidal Schwalbe, MD   Chief Complaint:  Abnormal lab  HPI: 80 yo female lives alone fell last week and hurt her back.  She had imaging then which showed a fracture of left arm but no hip/pelvic or lumbar spine fractures.  She went home and has not been able to move due to the pain.  She is not eating and drinking well.  No radiation of the pain.  No dysuria.  No saddle anesthesia.  No weakness in her legs.  She is having a lot of muscle spasms also.  She is taking vicodin at home, and it helps some.  No fevers.  No n/v/d.  Review of Systems:  Positive and negative as per HPI otherwise all other systems are negative  Past Medical History: Past Medical History  Diagnosis Date  . Shingles     zoster 1990 on the back and on the abdomen in 2007,facial zoster 2006  . Hypertension   . A-fib   . Diabetes mellitus without complication   . Glaucoma   . Pulmonary embolism recurrent     IVC filter  . Acid reflux   . Arthritis   . Migraine   . Postherpetic trigeminal neuralgia 10/20/2012  . Unspecified cerebral artery occlusion with cerebral infarction   . Memory loss   . Chronic anticoagulation 03/07/2013    Lovenox 100 QD  . Junctional bradycardia     resolved with discontinuation of sotalol (2014) after 10 yrs  . DVT (deep venous thrombosis) 2007    recurrent w PE s/p [place] filter  . Hypoglycemia   . GERD (gastroesophageal reflux disease)     w hiatal hernia endoscopy 2003, Dr Penelope Coop  . Diverticulosis   . Phlebitis   . Osteoarthritis     in Bilateral knees and back. -Dr Latanya Maudlin  . Panic attacks   . Anxiety   . IBS (irritable bowel syndrome)   . Hypothyroidism     goiter  . OSA (obstructive sleep apnea) Dx: 2008    could never use CPAP repeat study showed no sleep apnea (HST 03-03-13 ESS 8, AHI 2/hr)  . Vitamin D deficiency   . CVA (cerebral infarction) 1964  . Osteoporosis   . Trigeminal neuralgia     r face--Dr Dohmeier  . Hypercholesterolemia   . Chronic  kidney disease     Stage III  . Dementia     Dr Brett Fairy   Past Surgical History  Procedure Laterality Date  . Blood clots  06/2006    lower abdomen  . Pulmonary embolism surgery  03/2005  . Phlebitis  12/2005  . Cholecystectomy  01/1983  . Biopsies of breast Bilateral 1971  . Migraine speech impairment  07/1963  . Hemorrhoid surgery  1966  . Appendectomy  1945  . Tonsillectomy      1954  . Gallbladder surgery  1980  . Insertion of vena cova filter  2007  . Leg / ankle soft tissue biopsy Right     precancerous  . Cardiac catheterization  08/2001    A-fib/Palpitations Dr. Daneen Schick  . Dilation and curettage, diagnostic / therapeutic  1952/1977    x2  . Foot surgery Right 1978    Cyst removed from Right Foot    Medications: Prior to Admission medications   Medication Sig Start Date End Date Taking? Authorizing Provider  HYDROcodone-acetaminophen (NORCO/VICODIN) 5-325 MG per tablet Take 1 tablet by mouth every 6 (six) hours as needed for moderate pain.   Yes  Historical Provider, MD  acetaminophen-codeine (TYLENOL #3) 300-30 MG per tablet Take 1 tablet by mouth every 6 (six) hours as needed for moderate pain. 07/23/13   Larene Pickett, PA-C  amLODipine (NORVASC) 5 MG tablet Take 1 tablet (5 mg total) by mouth daily. 05/25/13   Belva Crome III, MD  CARBATROL 100 MG 12 hr capsule TAKE THREE CAPSULES BY MOUTH EVERY MORNING, TWO CAPSULES AT NOON, AND THREE CAPSULES AT BEDTIME. TAKE AFTER MEALS. 04/19/13   Larey Seat, MD  enoxaparin (LOVENOX) 100 MG/ML injection Inject 100 mg into the skin at bedtime.     Historical Provider, MD  EPINEPHrine (EPIPEN IJ) Inject as directed once as needed. For allergic reactions    Historical Provider, MD  Fe Fum-FA-B Cmp-C-Zn-Mg-Mn-Cu (HEMOCYTE PLUS) 106-1 MG CAPS Take 1 capsule by mouth daily.     Historical Provider, MD  folic acid (FOLVITE) 1 MG tablet Take 1 mg by mouth every morning.     Historical Provider, MD  furosemide (LASIX) 20 MG tablet  Take 20 mg by mouth as needed for fluid.  03/16/13   Historical Provider, MD  gabapentin (NEURONTIN) 300 MG capsule Take 300 mg by mouth at bedtime.    Historical Provider, MD  levothyroxine (SYNTHROID, LEVOTHROID) 75 MCG tablet Take 75 mcg by mouth daily before breakfast.     Historical Provider, MD  loratadine (CLARITIN) 10 MG tablet Take 10 mg by mouth daily.    Historical Provider, MD  meclizine (ANTIVERT) 12.5 MG tablet Take 12.5 mg by mouth 3 (three) times daily as needed. For dizziness    Historical Provider, MD  Menthol (EUCERIN SKIN CALMING) 0.1 % LOTN Apply 1 application topically daily as needed. For dry skin    Historical Provider, MD  Multiple Vitamin (MULTIVITAMIN) tablet Take 1 tablet by mouth every morning.     Historical Provider, MD  pravastatin (PRAVACHOL) 40 MG tablet Take 40 mg by mouth every morning.     Historical Provider, MD  RABEprazole (ACIPHEX) 20 MG tablet Take 20 mg by mouth every morning.     Historical Provider, MD  Travoprost, BAK Free, (TRAVATAN) 0.004 % SOLN ophthalmic solution Place 1 drop into both eyes at bedtime.    Historical Provider, MD  Vitamin D, Ergocalciferol, (DRISDOL) 50000 UNITS CAPS capsule Take 50,000 Units by mouth every 7 (seven) days. Takes on Sat    Historical Provider, MD    Allergies:   Allergies  Allergen Reactions  . Benadryl [Diphenhydramine Hcl] Hives and Shortness Of Breath  . Eye Drops [Tetrahydrazoline Hcl] Other (See Comments)    Burning, itching, swelling  . Flu Virus Vaccine Shortness Of Breath  . Iohexol Hives and Shortness Of Breath     Code: HIVES, Desc: PT IS ALLERGIC TO IVP DYE/IODINE/SHRIMP.  CAUSES SOB AND HIVES PER PT.  STEPHANIE, RT-R, Onset Date: [id number]   . Shrimp [Shellfish Allergy] Anaphylaxis  . Voltaren [Diclofenac Sodium] Other (See Comments)    Acid reflux symptoms  . Ambien [Zolpidem Tartrate] Hives  . Gluten Meal Diarrhea  . Travatan Z [Travoprost] Other (See Comments)    BRAND NAME ONLY OLCANNOT  TOLERATE GENERIC FORM OF EYE DROPS, IT CONTAINS PRESERVATIVES-SENSITIVITY  . Betadine Antibiotic-Moisturize [Bacitracin-Polymyxin B] Rash  . Biaxin [Clarithromycin] Hives  . Celebrex [Celecoxib] Rash  . Ciprofloxacin Hcl Rash       . Combigan [Brimonidine Tartrate-Timolol] Other (See Comments)    unknown  . Coumadin [Warfarin Sodium] Rash  . Ivp Dye [Iodinated Diagnostic Agents] Hives  .  Levaquin [Levofloxacin In D5w] Rash  . Naprosyn [Naproxen] Rash  . Pataday [Olopatadine Hcl] Other (See Comments)  . Penicillins Hives  . Plavix [Clopidogrel Bisulfate] Rash  . Septra [Sulfamethoxazole-Tmp Ds] Hives    Social History:  reports that she has never smoked. She has never used smokeless tobacco. She reports that she drinks alcohol. She reports that she does not use illicit drugs.  Family History: Family History  Problem Relation Age of Onset  . Diabetes Mother   . Hypertension Mother   . Glaucoma Mother   . CVA Mother   . CAD Mother   . Hypertension Father   . Heart attack Father   . Kidney failure Father   . Heart attack Brother     2 half brothers  . Hypertension Brother   . CVA Brother   . Glaucoma Other   . Glaucoma Other   . Breast cancer Maternal Aunt   . Colon cancer Maternal Uncle   . Rectal cancer Maternal Uncle   . Prostate cancer Maternal Grandfather     Physical Exam: Filed Vitals:   08/03/13 1945 08/03/13 2045 08/03/13 2048 08/03/13 2100  BP: 135/110 150/133 141/90 144/63  Pulse: 83 73 81 71  Temp:      TempSrc:      Resp: 13  18   SpO2: 98% 100% 98% 100%   General appearance: alert, cooperative and no distress Head: Normocephalic, without obvious abnormality, atraumatic Eyes: negative Nose: Nares normal. Septum midline. Mucosa normal. No drainage or sinus tenderness. Neck: no JVD and supple, symmetrical, trachea midline Lungs: clear to auscultation bilaterally Heart: regular rate and rhythm, S1, S2 normal, no murmur, click, rub or gallop Abdomen:  soft, non-tender; bowel sounds normal; no masses,  no organomegaly Extremities: extremities normal, atraumatic, no cyanosis or edema Pulses: 2+ and symmetric Skin: Skin color, texture, turgor normal. No rashes or lesions Neurologic: Grossly normal  Labs on Admission:   Recent Labs  08/03/13 1526  NA 124*  K 4.5  CL 86*  CO2 27  GLUCOSE 112*  BUN 21  CREATININE 1.16*  CALCIUM 9.0    Recent Labs  08/03/13 1905  AST 16  ALT 10  ALKPHOS 107  BILITOT 0.2*  PROT 7.0  ALBUMIN 3.7    Recent Labs  08/03/13 1526  WBC 5.1  HGB 9.6*  HCT 28.0*  MCV 80.5  PLT 178   Radiological Exams on Admission: Dg Chest 2 View  08/03/2013   CLINICAL DATA:  Weakness, fell 1 week ago, shortness of breath  EXAM: CHEST  2 VIEW  COMPARISON:  DG CHEST 1V PORT dated 03/06/2013  FINDINGS: The heart size and mediastinal contours are within normal limits. Both lungs are clear. The visualized skeletal structures are unremarkable.  IMPRESSION: No active cardiopulmonary disease.   Electronically Signed   By: Skipper Cliche M.D.   On: 08/03/2013 20:32   Dg Lumbar Spine Complete  07/25/2013   CLINICAL DATA:  Fall 2 days ago  EXAM: LUMBAR SPINE - COMPLETE 4+ VIEW  COMPARISON:  01/03/2010  FINDINGS: The bones are diffusely osteopenic. The alignment of the lumbar spine is normal. There is mild multi level degenerative disc disease. No fractures identified. IVC filter noted.  IMPRESSION: 1. No acute findings.   Electronically Signed   By: Kerby Moors M.D.   On: 07/25/2013 12:51   Dg Wrist Complete Left  07/23/2013   CLINICAL DATA:  Fall, left wrist pain  EXAM: LEFT WRIST - COMPLETE 3+ VIEW  COMPARISON:  None.  FINDINGS: Impacted distal radial metaphyseal fracture.  No definite intra-articular extension.  Mild degenerative changes involving the 1st carpometacarpal joint.  Mild soft tissue swelling.  IMPRESSION: Impacted distal radial fracture.   Electronically Signed   By: Julian Hy M.D.   On: 07/23/2013  19:04   Dg Hip Complete Right  07/25/2013   CLINICAL DATA:  Fall.  EXAM: RIGHT HIP - COMPLETE 2+ VIEW  COMPARISON:  None.  FINDINGS: No acute bony or joint abnormality.  Degenerative changes right hip.  IMPRESSION: No acute abnormality.  DJD.   Electronically Signed   By: Marcello Moores  Register   On: 07/25/2013 12:48   Dg Hand Complete Left  07/23/2013   CLINICAL DATA:  Fall, left wrist pain  EXAM: LEFT HAND - COMPLETE 3+ VIEW  COMPARISON:  None.  FINDINGS: No fracture or dislocation is seen in the hand. Transverse/impacted distal radial fracture.  The joint spaces are preserved.  The visualized soft tissues are unremarkable.  IMPRESSION: Impacted distal radial fracture.   Electronically Signed   By: Julian Hy M.D.   On: 07/23/2013 19:03    Assessment/Plan  80 yo female with intractable back pain not well controlled at home with dehydration and hyponatemia as a result  Principal Problem:   Hyponatremia-  ivf overnight.  ua pending.  Repeat in am.  The issue is uncontrolled pain.  Active Problems:   PAF (paroxysmal atrial fibrillation)   Chronic anticoagulation   History of pulmonary embolus (PE)   Essential hypertension   Back injury-  Add muscle relaxer.  Add iv dilaudid for breakthru pain.  Repeat imaging.     Dehydration   Back pain   Closed left arm fracture  obs on tele.  Full code.  Rachal A Shanon Brow 08/03/2013, 9:54 PM

## 2013-08-03 NOTE — ED Notes (Signed)
Pt was seen here recently for a fall.  Pt went to see Dr. Dema Severin as a follow up to the fall and they drew blood yesterday, pt continues to be weak.  Told her sodium was low and something else was abnormal.  Pt has had periods of confusion and weakness.  Pt continues to have lower back pain that was diagnosed as sciatica

## 2013-08-03 NOTE — ED Notes (Signed)
Pt had a headache all day yesterday where she had stitches placed from previous fall but now resolved

## 2013-08-03 NOTE — ED Notes (Signed)
Pt tried to collect clean catch urine sample and spilled/dropped it in the bathroom.  Pt given another glass of water to drink.

## 2013-08-03 NOTE — ED Notes (Signed)
Pt states she is unable to give urine at this time. Given water

## 2013-08-03 NOTE — ED Provider Notes (Signed)
CSN: [id number]     Arrival date & time 08/03/13  1446 History   First MD Initiated Contact with Patient 08/03/13 1747     Chief Complaint  Patient presents with  . Weakness  . Back Pain     (Consider location/radiation/quality/duration/timing/severity/associated sxs/prior Treatment) HPI  Patient brought to the ED by her daughter for evaluation at the request of her PCP Dr. Dema Severin. She fell approx 10 days ago sustaining a left arm fracture, head laceration, and right hip/back pain. She returned a week later due to severe back pain and was discharged home with Vicodin. Since then she has been having difficulty getting around. She lives at home but does have help that comes to check on her. Her daughter took her to her primary care doctor yesterday to be evaluated since she "has not been right" since the fall. They did blood work and found lab abnormality. The daughter feels like she is not doing well at home. She hasnt been eating or drinking as well because she hurts too badly to get around. She has not had another fall. Pt is awake, alert and oriented at this time. Complaints only of back pain.   Past Medical History  Diagnosis Date  . Shingles     zoster 1990 on the back and on the abdomen in 2007,facial zoster 2006  . Hypertension   . A-fib   . Diabetes mellitus without complication   . Glaucoma   . Pulmonary embolism recurrent     IVC filter  . Acid reflux   . Arthritis   . Migraine   . Postherpetic trigeminal neuralgia 10/20/2012  . Unspecified cerebral artery occlusion with cerebral infarction   . Memory loss   . Chronic anticoagulation 03/07/2013    Lovenox 100 QD  . Junctional bradycardia     resolved with discontinuation of sotalol (2014) after 10 yrs  . DVT (deep venous thrombosis) 2007    recurrent w PE s/p [place] filter  . Hypoglycemia   . GERD (gastroesophageal reflux disease)     w hiatal hernia endoscopy 2003, Dr Penelope Coop  . Diverticulosis   . Phlebitis   .  Osteoarthritis     in Bilateral knees and back. -Dr Latanya Maudlin  . Panic attacks   . Anxiety   . IBS (irritable bowel syndrome)   . Hypothyroidism     goiter  . OSA (obstructive sleep apnea) Dx: 2008    could never use CPAP repeat study showed no sleep apnea (HST 03-03-13 ESS 8, AHI 2/hr)  . Vitamin D deficiency   . CVA (cerebral infarction) 1964  . Osteoporosis   . Trigeminal neuralgia     r face--Dr Dohmeier  . Hypercholesterolemia   . Chronic kidney disease     Stage III  . Dementia     Dr Brett Fairy   Past Surgical History  Procedure Laterality Date  . Blood clots  06/2006    lower abdomen  . Pulmonary embolism surgery  03/2005  . Phlebitis  12/2005  . Cholecystectomy  01/1983  . Biopsies of breast Bilateral 1971  . Migraine speech impairment  07/1963  . Hemorrhoid surgery  1966  . Appendectomy  1945  . Tonsillectomy      1954  . Gallbladder surgery  1980  . Insertion of vena cova filter  2007  . Leg / ankle soft tissue biopsy Right     precancerous  . Cardiac catheterization  08/2001    A-fib/Palpitations Dr. Daneen Schick  . Dilation  and curettage, diagnostic / therapeutic  1952/1977    x2  . Foot surgery Right 1978    Cyst removed from Right Foot   Family History  Problem Relation Age of Onset  . Diabetes Mother   . Hypertension Mother   . Glaucoma Mother   . CVA Mother   . CAD Mother   . Hypertension Father   . Heart attack Father   . Kidney failure Father   . Heart attack Brother     2 half brothers  . Hypertension Brother   . CVA Brother   . Glaucoma Other   . Glaucoma Other   . Breast cancer Maternal Aunt   . Colon cancer Maternal Uncle   . Rectal cancer Maternal Uncle   . Prostate cancer Maternal Grandfather    History  Substance Use Topics  . Smoking status: Never Smoker   . Smokeless tobacco: Never Used  . Alcohol Use: Yes     Comment: very moderate, none  in the last two months   OB History   Grav Para Term Preterm Abortions TAB SAB Ect  Mult Living                 Review of Systems   Review of Systems  Gen: no weight loss, fevers, chills, night sweats  Eyes: no discharge or drainage, no occular pain or visual changes  Nose: no epistaxis or rhinorrhea  Mouth: no dental pain, no sore throat  Neck: no neck pain  Lungs:No wheezing, coughing or hemoptysis CV: no chest pain, palpitations, dependent edema or orthopnea  Abd: no abdominal pain, nausea, vomiting, diarrhea GU: no dysuria or gross hematuria  MSK:  No muscle weakness, + low back pain Neuro: no headache, no focal neurologic deficits  Skin: no rash or wounds Psyche: no complaints     Allergies  Ambien; Benadryl; Betadine antibiotic-moisturize; Biaxin; Celebrex; Ciprofloxacin hcl; Combigan; Coumadin; Coumarin; Eye drops; Flu virus vaccine; Ivp dye; Levaquin; Naprosyn; Pataday; Penicillins; Plavix; Septra; Shrimp; Voltaren; Gluten meal; and Iohexol  Home Medications   Prior to Admission medications   Medication Sig Start Date End Date Taking? Authorizing Provider  acetaminophen-codeine (TYLENOL #3) 300-30 MG per tablet Take 1 tablet by mouth every 6 (six) hours as needed for moderate pain. 07/23/13   Larene Pickett, PA-C  amLODipine (NORVASC) 5 MG tablet Take 1 tablet (5 mg total) by mouth daily. 05/25/13   Belva Crome III, MD  CARBATROL 100 MG 12 hr capsule TAKE THREE CAPSULES BY MOUTH EVERY MORNING, TWO CAPSULES AT NOON, AND THREE CAPSULES AT BEDTIME. TAKE AFTER MEALS. 04/19/13   Larey Seat, MD  enoxaparin (LOVENOX) 100 MG/ML injection Inject 100 mg into the skin at bedtime.     Historical Provider, MD  EPINEPHrine (EPIPEN IJ) Inject as directed once as needed. For allergic reactions    Historical Provider, MD  Fe Fum-FA-B Cmp-C-Zn-Mg-Mn-Cu (HEMOCYTE PLUS) 106-1 MG CAPS Take 1 capsule by mouth daily.     Historical Provider, MD  folic acid (FOLVITE) 1 MG tablet Take 1 mg by mouth every morning.     Historical Provider, MD  furosemide (LASIX) 20 MG tablet  Take 20 mg by mouth as needed for fluid.  03/16/13   Historical Provider, MD  gabapentin (NEURONTIN) 300 MG capsule Take 300 mg by mouth at bedtime.    Historical Provider, MD  levothyroxine (SYNTHROID, LEVOTHROID) 75 MCG tablet Take 75 mcg by mouth daily before breakfast.     Historical Provider, MD  loratadine (CLARITIN) 10 MG tablet Take 10 mg by mouth daily.    Historical Provider, MD  meclizine (ANTIVERT) 12.5 MG tablet Take 12.5 mg by mouth 3 (three) times daily as needed. For dizziness    Historical Provider, MD  Menthol (EUCERIN SKIN CALMING) 0.1 % LOTN Apply 1 application topically daily as needed. For dry skin    Historical Provider, MD  Multiple Vitamin (MULTIVITAMIN) tablet Take 1 tablet by mouth every morning.     Historical Provider, MD  pravastatin (PRAVACHOL) 40 MG tablet Take 40 mg by mouth every morning.     Historical Provider, MD  RABEprazole (ACIPHEX) 20 MG tablet Take 20 mg by mouth every morning.     Historical Provider, MD  Travoprost, BAK Free, (TRAVATAN) 0.004 % SOLN ophthalmic solution Place 1 drop into both eyes at bedtime.    Historical Provider, MD  Vitamin D, Ergocalciferol, (DRISDOL) 50000 UNITS CAPS capsule Take 50,000 Units by mouth every 7 (seven) days. Takes on Sat    Historical Provider, MD   BP 141/90  Pulse 81  Temp(Src) 97.1 F (36.2 C) (Oral)  Resp 18  SpO2 98% Physical Exam  Nursing note and vitals reviewed. Constitutional: She appears well-developed and well-nourished. No distress.  HENT:  Head: Normocephalic and atraumatic.  Eyes: Pupils are equal, round, and reactive to light.  Neck: Normal range of motion. Neck supple.  Cardiovascular: Normal rate and regular rhythm.   Pulmonary/Chest: Effort normal.  Abdominal: Soft.  Musculoskeletal:       Back:  Pt has equal strength to bilateral lower extremities.  Neurosensory function adequate to both legs No clonus on dorsiflextion Skin color is normal. Skin is warm and moist.  I see no step off  deformity, no midline bony tenderness.  Pt is unable to ambulate without difficulty.  No crepitus, laceration, effusion, induration, lesions, swelling.   Pedal pulses are symmetrical and palpable bilaterally  Moderate to severe tenderness to palpation of lumbar spine   Neurological: She is alert.  Skin: Skin is warm and dry.      ED Course  Procedures (including critical care time) Labs Review Labs Reviewed  CBC - Abnormal; Notable for the following:    RBC 3.48 (*)    Hemoglobin 9.6 (*)    HCT 28.0 (*)    RDW 16.9 (*)    All other components within normal limits  BASIC METABOLIC PANEL - Abnormal; Notable for the following:    Sodium 124 (*)    Chloride 86 (*)    Glucose, Bld 112 (*)    Creatinine, Ser 1.16 (*)    GFR calc non Af Amer 43 (*)    GFR calc Af Amer 50 (*)    All other components within normal limits  HEPATIC FUNCTION PANEL - Abnormal; Notable for the following:    Total Bilirubin 0.2 (*)    All other components within normal limits  URINALYSIS, ROUTINE W REFLEX MICROSCOPIC  PROTIME-INR    Imaging Review Dg Chest 2 View  08/03/2013   CLINICAL DATA:  Weakness, fell 1 week ago, shortness of breath  EXAM: CHEST  2 VIEW  COMPARISON:  DG CHEST 1V PORT dated 03/06/2013  FINDINGS: The heart size and mediastinal contours are within normal limits. Both lungs are clear. The visualized skeletal structures are unremarkable.  IMPRESSION: No active cardiopulmonary disease.   Electronically Signed   By: Skipper Cliche M.D.   On: 08/03/2013 20:32     EKG Interpretation   Date/Time:  Tuesday  Aug 03 2013 15:28:26 EDT Ventricular Rate:  77 PR Interval:  170 QRS Duration: 90 QT Interval:  370 QTC Calculation: 418 R Axis:   -20 Text Interpretation:  Normal sinus rhythm Left ventricular hypertrophy  Nonspecific T wave abnormality Abnormal ECG Confirmed by Alvino Chapel  MD,  Ovid Curd [phone]) on 08/03/2013 3:32:38 PM      MDM   Final diagnoses:  Hyponatremia  Low back  pain      Patient to the ER with complaints of low sodium and chloride due to not eating and drinking normal because of her low back pain from a fall about 10 days ago. Daughter says the pt lives alone and does not feel that she will do well to go back home. This is her 2nd psot visit since the fall in 10 days. I will admit to Tele, Obs, [facility], team 10.  Her labs otherwise appear to be at baseline, her urine is normal which was collected after she had been obtaining fluids here in the ED. She accidentally missed the cup when she urinated the first time so this is not accurate if looking for ketones.  IV fluids ordered at 100 ml per hour. Vital signs are otherwise stable. Pain treated in the ED with oral medications.   Linus Mako, PA-C 08/03/13 2118

## 2013-08-04 DIAGNOSIS — I10 Essential (primary) hypertension: Secondary | ICD-10-CM

## 2013-08-04 DIAGNOSIS — E119 Type 2 diabetes mellitus without complications: Secondary | ICD-10-CM | POA: Diagnosis not present

## 2013-08-04 DIAGNOSIS — E871 Hypo-osmolality and hyponatremia: Secondary | ICD-10-CM | POA: Diagnosis not present

## 2013-08-04 DIAGNOSIS — M549 Dorsalgia, unspecified: Secondary | ICD-10-CM | POA: Diagnosis not present

## 2013-08-04 LAB — BASIC METABOLIC PANEL
BUN: 19 mg/dL (ref 6–23)
CALCIUM: 8.8 mg/dL (ref 8.4–10.5)
CHLORIDE: 92 meq/L — AB (ref 96–112)
CO2: 24 mEq/L (ref 19–32)
Creatinine, Ser: 1.13 mg/dL — ABNORMAL HIGH (ref 0.50–1.10)
GFR calc Af Amer: 51 mL/min — ABNORMAL LOW (ref >90)
GFR calc non Af Amer: 44 mL/min — ABNORMAL LOW (ref >90)
GLUCOSE: 102 mg/dL — AB (ref 70–99)
Potassium: 4.6 mEq/L (ref 3.7–5.3)
SODIUM: 126 meq/L — AB (ref 137–147)

## 2013-08-04 LAB — CBC
HCT: 26.3 % — ABNORMAL LOW (ref 36.0–46.0)
HEMOGLOBIN: 9.1 g/dL — AB (ref 12.0–15.0)
MCH: 27.9 pg (ref 26.0–34.0)
MCHC: 34.6 g/dL (ref 30.0–36.0)
MCV: 80.7 fL (ref 78.0–100.0)
Platelets: 179 10*3/uL (ref 150–400)
RBC: 3.26 MIL/uL — AB (ref 3.87–5.11)
RDW: 16.8 % — ABNORMAL HIGH (ref 11.5–15.5)
WBC: 6.9 10*3/uL (ref 4.0–10.5)

## 2013-08-04 LAB — GLUCOSE, CAPILLARY
GLUCOSE-CAPILLARY: 113 mg/dL — AB (ref 70–99)
Glucose-Capillary: 102 mg/dL — ABNORMAL HIGH (ref 70–99)
Glucose-Capillary: 142 mg/dL — ABNORMAL HIGH (ref 70–99)

## 2013-08-04 MED ORDER — INSULIN ASPART 100 UNIT/ML ~~LOC~~ SOLN: 0.0000 [IU] | Freq: Every day | SUBCUTANEOUS | Status: DC

## 2013-08-04 MED ORDER — INSULIN ASPART 100 UNIT/ML ~~LOC~~ SOLN
0.0000 [IU] | Freq: Three times a day (TID) | SUBCUTANEOUS | Status: DC
  Administered 2013-08-04: 2 [IU] via SUBCUTANEOUS

## 2013-08-04 NOTE — Progress Notes (Addendum)
[facility] PROGRESS NOTE  [name] ION:[id number] DOB: 11-16-1929 DOA: 08/03/2013 PCP: Vidal Schwalbe, MD  Assessment/Plan: 1. Hyponatremia 1. Suspect secondary to dehydration 2. Improving with ivf - will cont 3. Cont to monitor bmp 2. HTN 1. BP stable 3. Afib 1. On therapeutic lovenox 4. Documented Hx of diabetes? 1. Listed as a diagnosis on previous clinic notes 2. Random blood sugars appear controlled 3. Not on DM meds 4. Will check a1c at next blood draw 5. For now, will start SSI coverage 5. Back pain 1. On pain meds as needed 6. L arm fx 7. DVT prophylaxis 1. Therapeutic lovenox 8. Social 1. Pt lives alone and has baseline memory loss 2. Will consult PT/OT -> ?SW consult needed 3. Discussed social issues with daughter on phone. Daughter reports now having more difficult time caring for mother secondary to her own medical issues. She is in agreement with PT consult per above, and ultimately if recommended, agreeable to placement.  Code Status: Full Family Communication: Pt's daughter on phone Disposition Plan: Pending   Consultants:  PT/OT  Procedures:    Antibiotics:   (indicate start date, and stop date if known)  HPI/Subjective: No acute events overnight  Objective: Filed Vitals:   08/03/13 2100 08/03/13 2301 08/04/13 0443 08/04/13 0816  BP: 144/63 142/45 118/39 106/59  Pulse: 71  78 68  Temp:  97.7 F (36.5 C) 98.1 F (36.7 C)   TempSrc:  Oral Oral   Resp:   18   Height:  5\' 5"  (1.651 m)    Weight:  98.7 kg (217 lb 9.5 oz)    SpO2: 100% 100% 92%    No intake or output data in the 24 hours ending 08/04/13 0826 Filed Weights   08/03/13 2301  Weight: 98.7 kg (217 lb 9.5 oz)    Exam:  General:  Awake, in nad  Cardiovascular: Regular, s1, s2  Respiratory: normal resp, no wheezing  Abdomen: soft, nondistended  Musculoskeletal: perfused, no clubbing   Data Reviewed: Basic Metabolic Panel:  Recent Labs Lab  08/03/13 1526 08/04/13 0225  NA 124* 126*  K 4.5 4.6  CL 86* 92*  CO2 27 24  GLUCOSE 112* 102*  BUN 21 19  CREATININE 1.16* 1.13*  CALCIUM 9.0 8.8   Liver Function Tests:  Recent Labs Lab 08/03/13 1905  AST 16  ALT 10  ALKPHOS 107  BILITOT 0.2*  PROT 7.0  ALBUMIN 3.7   No results found for this basename: LIPASE, AMYLASE,  in the last 168 hours No results found for this basename: AMMONIA,  in the last 168 hours CBC:  Recent Labs Lab 08/03/13 1526 08/04/13 0225  WBC 5.1 6.9  HGB 9.6* 9.1*  HCT 28.0* 26.3*  MCV 80.5 80.7  PLT 178 179   Cardiac Enzymes: No results found for this basename: CKTOTAL, CKMB, CKMBINDEX, TROPONINI,  in the last 168 hours BNP (last 3 results) No results found for this basename: PROBNP,  in the last 8760 hours CBG: No results found for this basename: GLUCAP,  in the last 168 hours  No results found for this or any previous visit (from the past 240 hour(s)).   Studies: Dg Chest 2 View  08/03/2013   CLINICAL DATA:  Weakness, fell 1 week ago, shortness of breath  EXAM: CHEST  2 VIEW  COMPARISON:  DG CHEST 1V PORT dated 03/06/2013  FINDINGS: The heart size and mediastinal contours are within normal limits. Both lungs are clear. The visualized skeletal structures are  unremarkable.  IMPRESSION: No active cardiopulmonary disease.   Electronically Signed   By: Skipper Cliche M.D.   On: 08/03/2013 20:32   Dg Lumbar Spine Complete  08/03/2013   CLINICAL DATA:  Right hip and low back pain starting today. Patient fell last week.  EXAM: LUMBAR SPINE - COMPLETE 4+ VIEW  COMPARISON:  DG LUMBAR SPINE COMPLETE dated 07/25/2013; CT VIRTUAL COLONOSCOPY DIAGNOSTIC dated 01/03/2010  FINDINGS: Mild endplate hypertrophic changes and degenerative changes in the facet joints There is no evidence of lumbar spine fracture. Alignment is normal. Intervertebral disc spaces are maintained. Incidental note of postoperative changes in the right upper quadrant and inferior vena  caval filter.  IMPRESSION: Degenerative changes in the lumbar spine. No displaced fractures identified. .   Electronically Signed   By: Lucienne Capers M.D.   On: 08/03/2013 22:01   Dg Hip Complete Right  08/03/2013   CLINICAL DATA:  Patient fell last week with right hip and low back pain started today  EXAM: RIGHT HIP - COMPLETE 2+ VIEW  COMPARISON:  None.  FINDINGS: There is no evidence of hip fracture or dislocation. There is no evidence of arthropathy or other focal bone abnormality.  IMPRESSION: Negative.   Electronically Signed   By: Skipper Cliche M.D.   On: 08/03/2013 22:00    Scheduled Meds: . sodium chloride   Intravenous STAT  . amLODipine  5 mg Oral Daily  . carbamazepine  300 mg Oral BID AC & HS   And  . carbamazepine  200 mg Oral Q1200  . enoxaparin  100 mg Subcutaneous QHS  . folic acid  1 mg Oral q morning - 10a  . levothyroxine  75 mcg Oral QAC breakfast  . loratadine  10 mg Oral Daily  . metaxalone  400 mg Oral TID  . [START ON 08/07/2013] Vitamin D (Ergocalciferol)  50,000 Units Oral Q Sat   Continuous Infusions: . sodium chloride 75 mL/hr at 08/03/13 2317    Principal Problem:   Hyponatremia Active Problems:   PAF (paroxysmal atrial fibrillation)   Chronic anticoagulation   History of pulmonary embolus (PE)   Essential hypertension   Back injury   Dehydration   Back pain   Closed left arm fracture  Time spent: 100min  Stephen K Chiu  [facility] Pager [phone]. If 7PM-7AM, please contact night-coverage at [email or web address], password [facility] 08/04/2013, 8:26 AM  LOS: 1 day

## 2013-08-04 NOTE — Progress Notes (Signed)
Physical Therapy Evaluation Patient Details Name: [name] MRN: [id number] DOB: March 16, 1930 Today's Date: 08/04/2013   History of Present Illness  Patient is an 80 yo female admitted 08/03/13 with dehydration and hyponatremia.  Patient had fallen 07/23/13 with Lt distal radius fx, laceration forehead, and back pain.  Since d/c from ED, patient unable to move/eat due to pain.  Clinical Impression  Patient presents with problems listed below.  Will benefit from acute PT to maximize independence prior to discharge.  Patient lives alone and is unable to use LUE.  Recommend SNF at discharge for continued therapy and safe discharge plan. MD:  Please clarify weight bearing status for LUE.  May patient bear weight through elbow?    Follow Up Recommendations SNF;Supervision/Assistance - 24 hour    Equipment Recommendations  Other (comment) (TBD)    Recommendations for Other Services       Precautions / Restrictions Precautions Precautions: Fall Precaution Comments: Memory loss Required Braces or Orthoses: Other Brace/Splint Other Brace/Splint: Cast/splint on LUE due to wrist fx. Restrictions Weight Bearing Restrictions: No (No orders.  Maintained NWB on LUE until clarified.)      Mobility  Bed Mobility Overal bed mobility: Needs Assistance Bed Mobility: Rolling;Supine to Sit;Sit to Supine Rolling: Min assist;Mod assist   Supine to sit: Max assist Sit to supine: Max assist   General bed mobility comments: Verbal cues for all mobility.  Patient having difficulty following commands to roll to right side.  Required mod assist to roll to right, and min assist to roll to left.  Attempted sidelying to sit, but patient having difficulty problem-solving process.  Moved supine <> sit with max assist required.  Used bed pads to assist patient in changing positions.  Patient able to sit EOB x 8 minutes with min guard assist for safety.  Transfers Overall transfer level: Needs  assistance Equipment used: 1 person hand held assist Transfers: Sit to/from Stand Sit to Stand: Mod assist         General transfer comment: Verbal cues for safety/technique.  Attempting to use LUE to assist - repeated cues to remain NWB on LUE.  Assist to rise to standing and for balance.  Patient stood x 90 seconds.  Returned to supine due to incontinence (for bedpan).  RN notified patient on bedpan.  Ambulation/Gait                Stairs            Wheelchair Mobility    Modified Rankin (Stroke Patients Only)       Balance Overall balance assessment: Needs assistance Sitting-balance support: Single extremity supported;Feet supported Sitting balance-Leahy Scale: Fair     Standing balance support: Single extremity supported Standing balance-Leahy Scale: Poor                               Pertinent Vitals/Pain     Home Living Family/patient expects to be discharged to:: Skilled nursing facility Living Arrangements: Alone             Home Equipment: Walker - 2 wheels      Prior Function Level of Independence: Independent with assistive device(s) (Prior to fall (mechanical fall over laundry basket))               Hand Dominance        Extremity/Trunk Assessment   Upper Extremity Assessment: LUE deficits/detail       LUE  Deficits / Details: Patient with wrist fx and with cast/splint.   Lower Extremity Assessment: Generalized weakness (Bruising noted especially Rt knee/shin)         Communication   Communication: No difficulties  Cognition Arousal/Alertness: Awake/alert Behavior During Therapy: Anxious Overall Cognitive Status: No family/caregiver present to determine baseline cognitive functioning Area of Impairment: Orientation;Memory;Following commands;Problem solving Orientation Level: Disoriented to;Place;Situation   Memory: Decreased short-term memory Following Commands: Follows one step commands with  increased time     Problem Solving: Slow processing;Decreased initiation;Difficulty sequencing;Requires verbal cues General Comments: Patient having difficulty recalling events of injury > admission.  Needed reminders.    General Comments      Exercises        Assessment/Plan    PT Assessment Patient needs continued PT services  PT Diagnosis Difficulty walking;Generalized weakness;Acute pain;Altered mental status   PT Problem List Decreased strength;Decreased activity tolerance;Decreased balance;Decreased mobility;Decreased cognition;Decreased knowledge of use of DME;Decreased knowledge of precautions;Obesity;Pain  PT Treatment Interventions DME instruction;Gait training;Functional mobility training;Therapeutic exercise;Balance training;Cognitive remediation;Patient/family education   PT Goals (Current goals can be found in the Care Plan section) Acute Rehab PT Goals Patient Stated Goal: To walk PT Goal Formulation: With patient Time For Goal Achievement: 08/18/13 Potential to Achieve Goals: Good    Frequency Min 3X/week   Barriers to discharge Decreased caregiver support Patient lives alone    Co-evaluation               End of Session Equipment Utilized During Treatment: Gait belt Activity Tolerance: Patient limited by fatigue Patient left: in bed;with call bell/phone within reach;with bed alarm set Nurse Communication: Mobility status (On bedpan)    Functional Assessment Tool Used: Clinical judgement Functional Limitation: Mobility: Walking and moving around Mobility: Walking and Moving Around Current Status ([id number]): At least 60 percent but less than 80 percent impaired, limited or restricted Mobility: Walking and Moving Around Goal Status [phone]): At least 1 percent but less than 20 percent impaired, limited or restricted    Time: 5397-6734 PT Time Calculation (min): 25 min   Charges:   PT Evaluation $Initial PT Evaluation Tier I: 1 Procedure PT  Treatments $Therapeutic Activity: 8-22 mins   PT G Codes:   Functional Assessment Tool Used: Clinical judgement Functional Limitation: Mobility: Walking and moving around    [employer] 08/04/2013, 2:57 PM Carita Pian. Sanjuana Kava, [place] Pager [phone]

## 2013-08-05 ENCOUNTER — Other Ambulatory Visit: Payer: Self-pay | Admitting: Neurology

## 2013-08-05 DIAGNOSIS — E860 Dehydration: Secondary | ICD-10-CM | POA: Diagnosis not present

## 2013-08-05 DIAGNOSIS — I10 Essential (primary) hypertension: Secondary | ICD-10-CM | POA: Diagnosis not present

## 2013-08-05 DIAGNOSIS — E039 Hypothyroidism, unspecified: Secondary | ICD-10-CM

## 2013-08-05 DIAGNOSIS — Z86711 Personal history of pulmonary embolism: Secondary | ICD-10-CM

## 2013-08-05 DIAGNOSIS — E871 Hypo-osmolality and hyponatremia: Secondary | ICD-10-CM | POA: Diagnosis not present

## 2013-08-05 DIAGNOSIS — M545 Low back pain, unspecified: Secondary | ICD-10-CM

## 2013-08-05 LAB — BASIC METABOLIC PANEL
BUN: 21 mg/dL (ref 6–23)
CHLORIDE: 93 meq/L — AB (ref 96–112)
CO2: 21 mEq/L (ref 19–32)
Calcium: 8.8 mg/dL (ref 8.4–10.5)
Creatinine, Ser: 1.1 mg/dL (ref 0.50–1.10)
GFR calc Af Amer: 53 mL/min — ABNORMAL LOW (ref >90)
GFR, EST NON AFRICAN AMERICAN: 46 mL/min — AB (ref >90)
GLUCOSE: 103 mg/dL — AB (ref 70–99)
POTASSIUM: 4.5 meq/L (ref 3.7–5.3)
SODIUM: 127 meq/L — AB (ref 137–147)

## 2013-08-05 LAB — GLUCOSE, CAPILLARY
GLUCOSE-CAPILLARY: 109 mg/dL — AB (ref 70–99)
GLUCOSE-CAPILLARY: 106 mg/dL — AB (ref 70–99)
GLUCOSE-CAPILLARY: 118 mg/dL — AB (ref 70–99)
GLUCOSE-CAPILLARY: 90 mg/dL (ref 70–99)
Glucose-Capillary: 95 mg/dL (ref 70–99)

## 2013-08-05 LAB — HEMOGLOBIN A1C
Hgb A1c MFr Bld: 5.9 % — ABNORMAL HIGH (ref <5.7)
Mean Plasma Glucose: 123 mg/dL — ABNORMAL HIGH (ref <117)

## 2013-08-05 MED ORDER — BISACODYL 10 MG RE SUPP
10.0000 mg | Freq: Every day | RECTAL | Status: DC | PRN
  Filled 2013-08-05: qty 1

## 2013-08-05 MED ORDER — LORAZEPAM 2 MG/ML IJ SOLN
0.5000 mg | Freq: Once | INTRAMUSCULAR | Status: AC
  Administered 2013-08-05: 0.5 mg via INTRAVENOUS
  Filled 2013-08-05: qty 1

## 2013-08-05 NOTE — Progress Notes (Addendum)
[facility] was notified that pt is trying to get out of bed and confused not able to reorient thinks that she is home and wants to go on the porch.Cbg check to assess if hypoglycemia 109  result. New orders given will continue to monitor. Arthor Captain LPN

## 2013-08-05 NOTE — Evaluation (Signed)
Occupational Therapy Evaluation Patient Details Name: [name] MRN: [id number] DOB: 06-Apr-1929 Today's Date: 08/05/2013    History of Present Illness Patient is an 80 yo female admitted 08/03/13 with dehydration and hyponatremia.  Patient had fallen 07/23/13 with Lt distal radius fx, laceration forehead, and back pain.  Since d/c from ED, patient unable to move/eat due to pain.   Clinical Impression   PT admitted with dehydration and hyponatremia with LT distal wrist fx.. Pt currently with functional limitiations due to the deficits listed below (see OT problem list).  Pt will benefit from skilled OT to increase their independence and safety with adls and balance to allow discharge SNF.     Follow Up Recommendations  SNF    Equipment Recommendations  Other (comment) (defer SNF)    Recommendations for Other Services       Precautions / Restrictions Precautions Precautions: Fall Precaution Comments: Memory loss Required Braces or Orthoses: Other Brace/Splint Other Brace/Splint: Cast/splint on LUE due to wrist fx. Restrictions Weight Bearing Restrictions: No      Mobility Bed Mobility Overal bed mobility: Needs Assistance Bed Mobility: Supine to Sit Rolling: Mod assist   Supine to sit: Mod assist     General bed mobility comments: cues for sequence and (A) to progress trunk to upright position  Transfers Overall transfer level: Needs assistance Equipment used: Left platform walker Transfers: Sit to/from Stand Sit to Stand: Mod assist;+2 physical assistance         General transfer comment: cues for position ing safety, hand placement and for anterior weight shift    Balance Overall balance assessment: Needs assistance         Standing balance support: Single extremity supported;During functional activity Standing balance-Leahy Scale: Fair                              ADL Overall ADL's : Needs assistance/impaired     Grooming: Minimal  assistance;Sitting                   Toilet Transfer: +2 for physical assistance;Moderate assistance;Stand-pivot;RW;BSC   Toileting- Clothing Manipulation and Hygiene: Maximal assistance;Sitting/lateral lean       Functional mobility during ADLs: Moderate assistance;+2 for physical assistance;Rolling walker General ADL Comments: Pt provided RW with platform this session adn demonstrates incr mobility. pt demonstrates 3n1 transfer and ambulates ~ 100 ft.      Vision                     Perception     Praxis      Pertinent Vitals/Pain VSS premedicated     Hand Dominance Right   Extremity/Trunk Assessment Upper Extremity Assessment Upper Extremity Assessment: Overall WFL for tasks assessed LUE Deficits / Details: Patient with wrist fx and with cast/splint.   Lower Extremity Assessment Lower Extremity Assessment: Defer to PT evaluation   Cervical / Trunk Assessment Cervical / Trunk Assessment: Kyphotic   Communication Communication Communication: No difficulties   Cognition Arousal/Alertness: Awake/alert Behavior During Therapy: Anxious Overall Cognitive Status: History of cognitive impairments - at baseline (no family present) Area of Impairment: Memory;Following commands;Safety/judgement;Awareness;Problem solving     Memory: Decreased recall of precautions;Decreased short-term memory Following Commands: Follows one step commands with increased time Safety/Judgement: Decreased awareness of deficits;Decreased awareness of safety Awareness: Emergent Problem Solving: Slow processing;Decreased initiation;Difficulty sequencing;Requires verbal cues General Comments: Pt wanting to sit on 3n1 and needed further education on need  to transfer to recliner until need to void bowels. pt expressed need to void bowels and unable to do so. pt unaware of void of bladder during 3n1 transfer.   General Comments       Exercises       Shoulder Instructions       Home Living Family/patient expects to be discharged to:: Skilled nursing facility Living Arrangements: Alone                           Home Equipment: Walker - 2 wheels          Prior Functioning/Environment Level of Independence: Independent with assistive device(s)             OT Diagnosis: Generalized weakness;Acute pain   OT Problem List: Decreased strength;Decreased activity tolerance;Impaired balance (sitting and/or standing);Decreased safety awareness;Decreased knowledge of use of DME or AE;Decreased knowledge of precautions;Pain   OT Treatment/Interventions: Self-care/ADL training;Therapeutic exercise;DME and/or AE instruction;Therapeutic activities;Cognitive remediation/compensation;Patient/family education;Balance training    OT Goals(Current goals can be found in the care plan section) Acute Rehab OT Goals Patient Stated Goal: to go to the bathroom OT Goal Formulation: With patient Time For Goal Achievement: 08/19/13 Potential to Achieve Goals: Good  OT Frequency: Min 2X/week   Barriers to D/C:            Co-evaluation              End of Session Nurse Communication: Mobility status;Precautions  Activity Tolerance: Patient tolerated treatment well Patient left: in chair;with call bell/phone within reach   Time: 1021-1039 OT Time Calculation (min): 18 min Charges:  OT General Charges $OT Visit: 1 Procedure OT Evaluation $Initial OT Evaluation Tier I: 1 Procedure OT Treatments $Self Care/Home Management : 8-22 mins G-Codes:    Peri Maris 2013-08-29, 1:48 PM Pager: [phone]

## 2013-08-05 NOTE — Telephone Encounter (Signed)
Directions were changed at [place] in July

## 2013-08-05 NOTE — Progress Notes (Signed)
[facility] PROGRESS NOTE  FAIGY STRETCH BTD:[id number] DOB: 02-08-1930 DOA: 08/03/2013 PCP: Vidal Schwalbe, MD  Assessment/Plan: 1. Hyponatremia 1. Was slightly improved with ivf 2. Previous records reviewed. 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 Hold IVF and fluid restrict 4. Cont to monitor bmp 2. HTN 1. BP stable 3. Afib 1. On therapeutic lovenox 4. Documented Hx of diabetes? 1. Listed as a diagnosis on previous clinic notes 2. Random blood sugars appear controlled 3. Not on DM meds 4. A1c pending 5. For now, will cont SSI coverage 5. Back pain 1. On pain meds as needed 6. L arm fx 1. Orthotic in place 7. DVT prophylaxis 1. Therapeutic lovenox 8. Social 1. Pt lives alone and has baseline memory loss 2. PT/OT -> ?SW consult needed 3. Recently, daughter reports now having more difficult time caring for mother secondary to her own medical issues. She is in agreement with PT consult per above, and ultimately if recommended, agreeable to placement.  Code Status: Full Family Communication: Pt in room Disposition Plan: Pending   Consultants:  PT/OT  Procedures:    Antibiotics:   (indicate start date, and stop date if known)  HPI/Subjective: No acute events overnight  Objective: Filed Vitals:   08/04/13 0443 08/04/13 0816 08/04/13 1349 08/04/13 2125  BP: 118/39 106/59 116/40 107/70  Pulse: 78 68 65 75  Temp: 98.1 F (36.7 C)  97.8 F (36.6 C) 97.8 F (36.6 C)  TempSrc: Oral  Oral Oral  Resp: 18  18 18   Height:      Weight:      SpO2: 92%  93% 100%    Intake/Output Summary (Last 24 hours) at 08/05/13 0809 Last data filed at 08/04/13 2032  Gross per 24 hour  Intake    250 ml  Output    500 ml  Net   -250 ml   Filed Weights   08/03/13 2301  Weight: 98.7 kg (217 lb 9.5 oz)    Exam:  General:  Awake, in nad  Cardiovascular: Regular, s1, s2  Respiratory: normal resp, no wheezing  Abdomen:  soft, nondistended  Musculoskeletal: perfused, no clubbing   Data Reviewed: Basic Metabolic Panel:  Recent Labs Lab 08/03/13 1526 08/04/13 0225 08/05/13 0329  NA 124* 126* 127*  K 4.5 4.6 4.5  CL 86* 92* 93*  CO2 27 24 21   GLUCOSE 112* 102* 103*  BUN 21 19 21   CREATININE 1.16* 1.13* 1.10  CALCIUM 9.0 8.8 8.8   Liver Function Tests:  Recent Labs Lab 08/03/13 1905  AST 16  ALT 10  ALKPHOS 107  BILITOT 0.2*  PROT 7.0  ALBUMIN 3.7   No results found for this basename: LIPASE, AMYLASE,  in the last 168 hours No results found for this basename: AMMONIA,  in the last 168 hours CBC:  Recent Labs Lab 08/03/13 1526 08/04/13 0225  WBC 5.1 6.9  HGB 9.6* 9.1*  HCT 28.0* 26.3*  MCV 80.5 80.7  PLT 178 179   Cardiac Enzymes: No results found for this basename: CKTOTAL, CKMB, CKMBINDEX, TROPONINI,  in the last 168 hours BNP (last 3 results) No results found for this basename: PROBNP,  in the last 8760 hours CBG:  Recent Labs Lab 08/04/13 1109 08/04/13 1613 08/04/13 2130 08/05/13 0233 08/05/13 0535  GLUCAP 102* 142* 113* 109* 95    No results found for this or any previous visit (from the past 240 hour(s)).   Studies: Dg  Chest 2 View  08/03/2013   CLINICAL DATA:  Weakness, fell 1 week ago, shortness of breath  EXAM: CHEST  2 VIEW  COMPARISON:  DG CHEST 1V PORT dated 03/06/2013  FINDINGS: The heart size and mediastinal contours are within normal limits. Both lungs are clear. The visualized skeletal structures are unremarkable.  IMPRESSION: No active cardiopulmonary disease.   Electronically Signed   By: Skipper Cliche M.D.   On: 08/03/2013 20:32   Dg Lumbar Spine Complete  08/03/2013   CLINICAL DATA:  Right hip and low back pain starting today. Patient fell last week.  EXAM: LUMBAR SPINE - COMPLETE 4+ VIEW  COMPARISON:  DG LUMBAR SPINE COMPLETE dated 07/25/2013; CT VIRTUAL COLONOSCOPY DIAGNOSTIC dated 01/03/2010  FINDINGS: Mild endplate hypertrophic changes and  degenerative changes in the facet joints There is no evidence of lumbar spine fracture. Alignment is normal. Intervertebral disc spaces are maintained. Incidental note of postoperative changes in the right upper quadrant and inferior vena caval filter.  IMPRESSION: Degenerative changes in the lumbar spine. No displaced fractures identified. .   Electronically Signed   By: Lucienne Capers M.D.   On: 08/03/2013 22:01   Dg Hip Complete Right  08/03/2013   CLINICAL DATA:  Patient fell last week with right hip and low back pain started today  EXAM: RIGHT HIP - COMPLETE 2+ VIEW  COMPARISON:  None.  FINDINGS: There is no evidence of hip fracture or dislocation. There is no evidence of arthropathy or other focal bone abnormality.  IMPRESSION: Negative.   Electronically Signed   By: Skipper Cliche M.D.   On: 08/03/2013 22:00    Scheduled Meds: . amLODipine  5 mg Oral Daily  . carbamazepine  300 mg Oral BID AC & HS   And  . carbamazepine  200 mg Oral Q1200  . enoxaparin  100 mg Subcutaneous QHS  . folic acid  1 mg Oral q morning - 10a  . insulin aspart  0-15 Units Subcutaneous TID WC  . insulin aspart  0-5 Units Subcutaneous QHS  . levothyroxine  75 mcg Oral QAC breakfast  . loratadine  10 mg Oral Daily  . metaxalone  400 mg Oral TID  . [START ON 08/07/2013] Vitamin D (Ergocalciferol)  50,000 Units Oral Q Sat   Continuous Infusions:    Principal Problem:   Hyponatremia Active Problems:   PAF (paroxysmal atrial fibrillation)   Chronic anticoagulation   History of pulmonary embolus (PE)   Essential hypertension   Back injury   Dehydration   Back pain   Closed left arm fracture  Time spent: 86min  Leonetta Mcgivern K Dylon Correa  [facility] Pager [phone]. If 7PM-7AM, please contact night-coverage at [email or web address], password [facility] 08/05/2013, 8:09 AM  LOS: 2 days

## 2013-08-05 NOTE — Care Management Note (Addendum)
    Page 1 of 2   08/06/2013     2:15:37 PM CARE MANAGEMENT NOTE 08/06/2013  Patient:  [name], [name]   Account Number:  [id number]  Date Initiated:  08/05/2013  Documentation initiated by:  MAYO,HENRIETTA  Subjective/Objective Assessment:   dx radial fx s/p fall; lives alone    PCP  Dr [name]     Action/Plan:   discharge planning   Anticipated DC Date:  08/07/2013   Anticipated DC Plan:  [place]  CM consult      [place]   Choice offered to / List presented to:  C-1 Patient   DME arranged  [place]      DME agency  [facility] arranged  HH-1 RN  [place] OT      [place].   Status of service:  Completed, signed off Medicare Important Message given?  YES (If response is "NO", the following Medicare IM given date fields will be blank) Date Medicare IM given:  08/03/2013 Date Additional Medicare IM given:  08/06/2013  Discharge Disposition:  [place]  Per UR Regulation:  Reviewed for med. necessity/level of care/duration of stay  If discussed at [place] of Stay Meetings, dates discussed:    Comments:  08/06/13 14:00 CM spoke with pt in room and daughter, Rush Landmark [phone] for choice of home health agency to render HHPT/OT/Aide/SW/RN for eval,  services.  Pt states she has had [facility] in the past and would like them again. [name] confirms pt's choice.  Left Platform walker will be delivered to room prior to discharge.  [name] states she has a raised toilet seat at home and a shower seat at home. CM left list of private duty agencies in the room for [facility] aware).  Referral called and left to voicemail to [facility] rep, [phone].  Address and contact information verified with [name] and [name] prefers [facility] contact her at [phone] [facility] aware).  No other CM needs were  communicated.  Mariane Masters, BSN, CM [phone].     Contact:  Rush Landmark, dtr  #[phone]  08/05/13 [place] RN MSN BSN CCM PT/OT recommending SNF, dtr states she thinks pt just needs a few days of therapy.  Because pt is observation status, explained that pt would need to pay privately for ST-SNF for rehab, pt would not qualify for inpt rehab.  Discussed home health PT and OT as possible option.  Dtr understands, may hire [facility] aide to assist upon pt discharge.

## 2013-08-06 DIAGNOSIS — Z7901 Long term (current) use of anticoagulants: Secondary | ICD-10-CM | POA: Diagnosis not present

## 2013-08-06 DIAGNOSIS — E860 Dehydration: Secondary | ICD-10-CM | POA: Diagnosis not present

## 2013-08-06 DIAGNOSIS — S42309A Unspecified fracture of shaft of humerus, unspecified arm, initial encounter for closed fracture: Secondary | ICD-10-CM | POA: Diagnosis not present

## 2013-08-06 DIAGNOSIS — E871 Hypo-osmolality and hyponatremia: Secondary | ICD-10-CM | POA: Diagnosis not present

## 2013-08-06 LAB — BASIC METABOLIC PANEL
BUN: 19 mg/dL (ref 6–23)
CALCIUM: 9.7 mg/dL (ref 8.4–10.5)
CO2: 24 mEq/L (ref 19–32)
CREATININE: 1.2 mg/dL — AB (ref 0.50–1.10)
Chloride: 94 mEq/L — ABNORMAL LOW (ref 96–112)
GFR, EST AFRICAN AMERICAN: 48 mL/min — AB (ref >90)
GFR, EST NON AFRICAN AMERICAN: 41 mL/min — AB (ref >90)
Glucose, Bld: 123 mg/dL — ABNORMAL HIGH (ref 70–99)
Potassium: 4.1 mEq/L (ref 3.7–5.3)
Sodium: 133 mEq/L — ABNORMAL LOW (ref 137–147)

## 2013-08-06 LAB — GLUCOSE, CAPILLARY
GLUCOSE-CAPILLARY: 98 mg/dL (ref 70–99)
Glucose-Capillary: 117 mg/dL — ABNORMAL HIGH (ref 70–99)
Glucose-Capillary: 118 mg/dL — ABNORMAL HIGH (ref 70–99)
Glucose-Capillary: 117 mg/dL — ABNORMAL HIGH (ref 70–99)

## 2013-08-06 LAB — CBC
HCT: 29.3 % — ABNORMAL LOW (ref 36.0–46.0)
Hemoglobin: 10 g/dL — ABNORMAL LOW (ref 12.0–15.0)
MCH: 28.1 pg (ref 26.0–34.0)
MCHC: 34.1 g/dL (ref 30.0–36.0)
MCV: 82.3 fL (ref 78.0–100.0)
PLATELETS: 191 10*3/uL (ref 150–400)
RBC: 3.56 MIL/uL — ABNORMAL LOW (ref 3.87–5.11)
RDW: 17.2 % — AB (ref 11.5–15.5)
WBC: 6.7 10*3/uL (ref 4.0–10.5)

## 2013-08-06 MED ORDER — FERROUS FUMARATE 325 (106 FE) MG PO TABS
1.0000 | ORAL_TABLET | Freq: Every day | ORAL | Status: DC
  Administered 2013-08-06 – 2013-08-07 (×2): 106 mg via ORAL
  Filled 2013-08-06 (×2): qty 1

## 2013-08-06 NOTE — Progress Notes (Signed)
Clinical Social Work Department BRIEF PSYCHOSOCIAL ASSESSMENT 08/06/2013  Patient:  [name], [name]     Account Number:  [id number]     Admit date:  08/03/2013  Clinical Social Worker:  Freeman Caldron  Date/Time:  08/06/2013 01:53 PM  Referred by:  Physician  Date Referred:  08/06/2013 Referred for  SNF Placement   Other Referral:   Interview type:  Patient Other interview type:   Also spoke with pt's daughter [name]    PSYCHOSOCIAL DATA Living Status:  ALONE Admitted from facility:   Level of care:   Primary support name:  Rush Landmark ([phone]) Primary support relationship to patient:  CHILD, ADULT Degree of support available:   Good--pt has support from daughter.    CURRENT CONCERNS Current Concerns  Post-Acute Placement   Other Concerns:    SOCIAL WORK ASSESSMENT / PLAN Pt agreeable to placement and wanted CSW to call her daughter to discuss. Pt was living in apartment independently before hospitalization. Daughter did not have any specific preferences, and CSW made referrals to all SNFs in [facility] and will provide bed offers once SNFs respond.   Assessment/plan status:  Psychosocial Support/Ongoing Assessment of Needs Other assessment/ plan:   Information/referral to community resources:   SNF    PATIENT'S/FAMILY'S RESPONSE TO PLAN OF CARE: Good--pt and daughter understanding of CSW role and thanked CSW for assistance with discharge planning.       Ky Barban, MSW, [facility] Clinical Social Worker [phone]

## 2013-08-06 NOTE — Progress Notes (Signed)
[facility] PROGRESS NOTE  [name] NLZ:[id number] DOB: 14-Mar-1930 DOA: 08/03/2013 PCP: Vidal Schwalbe, MD  Assessment/Plan: 1. Hyponatremia 1. Was slightly improved with ivf 2. Previous records reviewed and pt appears to have a hx of SIADH related hyponatremia as far back as 2010, improved with fluid restriction 3. Holding IVF and cont on 1200cc fluid restricted diet 4. Cont to monitor bmp 2. HTN 1. BP stable 3. Afib 1. On therapeutic lovenox 2. No signs of active bleed 4. Documented Hx of diabetes? 1. Listed as a diagnosis on previous clinic notes 2. Not on DM meds prior to admit 3. Daughter reports no history of actual diabetes 4. A1c of 5.9 5. CBG's have been very well controlled in-hospital 6. For now, will discontinue SSI coverage 5. Back pain 1. On pain meds as needed 6. L arm fx 1. Orthotic in place 7. DVT prophylaxis 1. Therapeutic lovenox 8. Social 1. Pt lives alone and has baseline memory loss 2. PT/OT -> with recs for SNF 3. SW notes reviewed. Will likely have to be self-pay for ST SNF. Consideration for home PT on d/c instead  Code Status: Full Family Communication: Pt in room, daughter at bedside Disposition Plan: Pending   Consultants:  PT/OT  Procedures:    Antibiotics:   (indicate start date, and stop date if known)  HPI/Subjective: No acute events noted overnight. Pt w/o complaints.  Objective: Filed Vitals:   08/04/13 1349 08/04/13 2125 08/05/13 2101 08/06/13 0626  BP: 116/40 107/70 154/52 143/53  Pulse: 65 75 81 78  Temp: 97.8 F (36.6 C) 97.8 F (36.6 C) 98.2 F (36.8 C) 97.5 F (36.4 C)  TempSrc: Oral Oral Oral Oral  Resp: 18 18 18 18   Height:      Weight:      SpO2: 93% 100% 98% 100%    Intake/Output Summary (Last 24 hours) at 08/06/13 0806 Last data filed at 08/06/13 9024  Gross per 24 hour  Intake      0 ml  Output    570 ml  Net   -570 ml   Filed Weights   08/03/13 2301  Weight: 98.7 kg (217 lb 9.5  oz)    Exam:  General:  Awake, in nad  Cardiovascular: Regular, s1, s2  Respiratory: normal resp, no wheezing  Abdomen: soft, nondistended  Musculoskeletal: perfused, no clubbing   Data Reviewed: Basic Metabolic Panel:  Recent Labs Lab 08/03/13 1526 08/04/13 0225 08/05/13 0329  NA 124* 126* 127*  K 4.5 4.6 4.5  CL 86* 92* 93*  CO2 27 24 21   GLUCOSE 112* 102* 103*  BUN 21 19 21   CREATININE 1.16* 1.13* 1.10  CALCIUM 9.0 8.8 8.8   Liver Function Tests:  Recent Labs Lab 08/03/13 1905  AST 16  ALT 10  ALKPHOS 107  BILITOT 0.2*  PROT 7.0  ALBUMIN 3.7   No results found for this basename: LIPASE, AMYLASE,  in the last 168 hours No results found for this basename: AMMONIA,  in the last 168 hours CBC:  Recent Labs Lab 08/03/13 1526 08/04/13 0225  WBC 5.1 6.9  HGB 9.6* 9.1*  HCT 28.0* 26.3*  MCV 80.5 80.7  PLT 178 179   Cardiac Enzymes: No results found for this basename: CKTOTAL, CKMB, CKMBINDEX, TROPONINI,  in the last 168 hours BNP (last 3 results) No results found for this basename: PROBNP,  in the last 8760 hours CBG:  Recent Labs Lab 08/05/13 0535 08/05/13 1108 08/05/13 1724 08/05/13 2109 08/06/13 0973  GLUCAP 95 106* 118* 90 98    No results found for this or any previous visit (from the past 240 hour(s)).   Studies: No results found.  Scheduled Meds: . amLODipine  5 mg Oral Daily  . carbamazepine  300 mg Oral BID AC & HS   And  . carbamazepine  200 mg Oral Q1200  . enoxaparin  100 mg Subcutaneous QHS  . folic acid  1 mg Oral q morning - 10a  . insulin aspart  0-15 Units Subcutaneous TID WC  . insulin aspart  0-5 Units Subcutaneous QHS  . levothyroxine  75 mcg Oral QAC breakfast  . loratadine  10 mg Oral Daily  . metaxalone  400 mg Oral TID  . [START ON 08/07/2013] Vitamin D (Ergocalciferol)  50,000 Units Oral Q Sat   Continuous Infusions:    Principal Problem:   Hyponatremia Active Problems:   PAF (paroxysmal atrial  fibrillation)   Chronic anticoagulation   History of pulmonary embolus (PE)   Essential hypertension   Back injury   Dehydration   Back pain   Closed left arm fracture  Time spent: 34min  Henery Betzold K Shanaye Rief  [facility] Pager [phone]. If 7PM-7AM, please contact night-coverage at [email or web address], password [facility] 08/06/2013, 8:06 AM  LOS: 3 days

## 2013-08-06 NOTE — Progress Notes (Signed)
Clinical Social Work Department CLINICAL SOCIAL WORK PLACEMENT NOTE 08/06/2013  Patient:  BRIENNE, LIGUORI  Account Number:  [id number] Admit date:  08/03/2013  Clinical Social Worker:  Ky Barban, Latanya Presser  Date/time:  08/06/2013 01:55 PM  Clinical Social Work is seeking post-discharge placement for this patient at the following level of care:   [place]   (*CSW will update this form in Epic as items are completed)   N/A-sent to all facilities in [facility]  Patient/family provided with [place] Department of Clinical Social Work's list of facilities offering this level of care within the geographic area requested by the patient (or if unable, by the patient's family).  08/06/2013  Patient/family informed of their freedom to choose among providers that offer the needed level of care, that participate in Medicare, Medicaid or managed care program needed by the patient, have an available bed and are willing to accept the patient.  N/A-not sent to this SNF  Patient/family informed of [facility]' ownership interest in [facility], as well as of the fact that they are under no obligation to receive care at this facility.  PASARR submitted to EDS on 08/06/2013 PASARR number received from EDS on 08/06/2013  FL2 transmitted to all facilities in geographic area requested by pt/family on  08/06/13 FL2 transmitted to all facilities within larger geographic area on   Patient informed that his/her managed care company has contracts with or will negotiate with  certain facilities, including the following:     Patient/family informed of bed offers received:   Patient chooses bed at  Physician recommends and patient chooses bed at    Patient to be transferred to  on   Patient to be transferred to facility by   The following physician request were entered in Epic:   Additional Comments:   Ky Barban, MSW, [place] Social Worker [phone]

## 2013-08-06 NOTE — Progress Notes (Signed)
Physical Therapy Treatment Patient Details Name: [name] MRN: [id number] DOB: [birth year] Today's Date: 08/18/13    History of Present Illness Patient is an 80 yo female admitted 08/03/13 with dehydration and hyponatremia.  Patient had fallen 07/23/13 with Lt distal radius fx, laceration forehead, and back pain.  Since d/c from ED, patient unable to move/eat due to pain.    PT Comments    Pt making good progress with mobility. Still requires assist for mobility.  Follow Up Recommendations  SNF     Equipment Recommendations  Other (comment) (platform for her walker at home.)    Recommendations for Other Services       Precautions / Restrictions Precautions Precautions: Fall Required Braces or Orthoses: Other Brace/Splint Other Brace/Splint: Cast/splint on LUE due to wrist fx. Restrictions LUE Weight Bearing: Weight bear through elbow only    Mobility  Bed Mobility Overal bed mobility: Needs Assistance Bed Mobility: Supine to Sit Rolling: Min assist         General bed mobility comments: Assist to bring trunk upright  Transfers Overall transfer level: Needs assistance Equipment used: Left platform walker Transfers: Sit to/from Stand Sit to Stand: Min assist         General transfer comment: Verbal cues for hand placement and technique  Ambulation/Gait Ambulation/Gait assistance: Min assist Ambulation Distance (Feet): 110 Feet Assistive device: Left platform walker Gait Pattern/deviations: Step-through pattern;Decreased step length - right;Decreased step length - left     General Gait Details: Verbal cues for use of walker with platform.   Stairs            Wheelchair Mobility    Modified Rankin (Stroke Patients Only)       Balance   Sitting-balance support: No upper extremity supported;Feet supported Sitting balance-Leahy Scale: Fair     Standing balance support: Bilateral upper extremity supported Standing balance-Leahy Scale:  Poor                      Cognition Arousal/Alertness: Awake/alert Behavior During Therapy: WFL for tasks assessed/performed Overall Cognitive Status: No family/caregiver present to determine baseline cognitive functioning   Orientation Level: Disoriented to;Place;Situation;Time   Memory: Decreased recall of precautions;Decreased short-term memory Following Commands: Follows one step commands consistently Safety/Judgement: Decreased awareness of deficits;Decreased awareness of safety   Problem Solving: Requires verbal cues;Requires tactile cues      Exercises      General Comments        Pertinent Vitals/Pain NAD    Home Living                      Prior Function            PT Goals (current goals can now be found in the care plan section) Progress towards PT goals: Goals met and updated - see care plan    Frequency  Min 3X/week    PT Plan Current plan remains appropriate    Co-evaluation             End of Session Equipment Utilized During Treatment: Gait belt Activity Tolerance: Patient tolerated treatment well Patient left: in chair;with call bell/phone within reach;with chair alarm set     Time: 4680-3212 PT Time Calculation (min): 12 min  Charges:  $Gait Training: 8-22 mins                    G Codes:      [name] [name] 2013/08/18, 1:30  PM  [employer] PT [phone]

## 2013-08-06 NOTE — Progress Notes (Signed)
Pt discharging with home health. RNCM has arranged for this. Family/pt informed and agreeable. CSW signing off.   Ky Barban, MSW, [facility] Clinical Social Worker [phone]

## 2013-08-07 DIAGNOSIS — IMO0002 Reserved for concepts with insufficient information to code with codable children: Secondary | ICD-10-CM | POA: Diagnosis not present

## 2013-08-07 DIAGNOSIS — E236 Other disorders of pituitary gland: Secondary | ICD-10-CM | POA: Diagnosis not present

## 2013-08-07 DIAGNOSIS — E222 Syndrome of inappropriate secretion of antidiuretic hormone: Secondary | ICD-10-CM | POA: Diagnosis present

## 2013-08-07 DIAGNOSIS — E871 Hypo-osmolality and hyponatremia: Secondary | ICD-10-CM | POA: Diagnosis not present

## 2013-08-07 DIAGNOSIS — I10 Essential (primary) hypertension: Secondary | ICD-10-CM | POA: Diagnosis not present

## 2013-08-07 LAB — BASIC METABOLIC PANEL
BUN: 23 mg/dL (ref 6–23)
CHLORIDE: 96 meq/L (ref 96–112)
CO2: 22 mEq/L (ref 19–32)
Calcium: 9 mg/dL (ref 8.4–10.5)
Creatinine, Ser: 1.21 mg/dL — ABNORMAL HIGH (ref 0.50–1.10)
GFR calc non Af Amer: 41 mL/min — ABNORMAL LOW (ref >90)
GFR, EST AFRICAN AMERICAN: 47 mL/min — AB (ref >90)
Glucose, Bld: 88 mg/dL (ref 70–99)
Potassium: 4.4 mEq/L (ref 3.7–5.3)
Sodium: 131 mEq/L — ABNORMAL LOW (ref 137–147)

## 2013-08-07 LAB — GLUCOSE, CAPILLARY
GLUCOSE-CAPILLARY: 98 mg/dL (ref 70–99)
GLUCOSE-CAPILLARY: 96 mg/dL (ref 70–99)
Glucose-Capillary: 90 mg/dL (ref 70–99)

## 2013-08-07 MED ORDER — METAXALONE 800 MG PO TABS: 400.0000 mg | ORAL_TABLET | Freq: Three times a day (TID) | ORAL | Status: DC

## 2013-08-07 MED ORDER — SODIUM CHLORIDE 1 G PO TABS: 1.0000 g | ORAL_TABLET | Freq: Two times a day (BID) | ORAL | Status: AC

## 2013-08-07 NOTE — Progress Notes (Signed)
Utilization review complete 

## 2013-08-07 NOTE — Discharge Summary (Addendum)
Physician Discharge Summary  [name] GEX:[id number] DOB: 10-21-1929 DOA: 08/03/2013  PCP: Vidal Schwalbe, MD  Admit date: 08/03/2013 Discharge date: 08/07/2013  Time spent: 40 minutes  Recommendations for Outpatient Follow-up:  1. Follow up with PCP in 1-2 weeks 2. Would repeat Basic Metabolic Panel within one week (with focus on sodium and Cr)  Discharge Diagnoses:  Principal Problem:   Hyponatremia Active Problems:   PAF (paroxysmal atrial fibrillation)   Chronic anticoagulation   History of pulmonary embolus (PE)   Essential hypertension   Back injury   Dehydration   Back pain   Closed left arm fracture   SIADH (syndrome of inappropriate ADH production)   Discharge Condition: Improved  Diet recommendation: Regular  Filed Weights   08/03/13 2301  Weight: 98.7 kg (217 lb 9.5 oz)    History of present illness:  80 yo female lives alone fell last week and hurt her back. She had imaging then which showed a fracture of left arm but no hip/pelvic or lumbar spine fractures. She went home and has not been able to move due to the pain. She is not eating and drinking well. No radiation of the pain. No dysuria. No saddle anesthesia. No weakness in her legs. She is having a lot of muscle spasms also. She is taking vicodin at home, and it helps some. No fevers. No n/v/d  Hospital Course:  1. Hyponatremia  1. Was slightly improved with ivf 2. Previous records reviewed and pt appears to have a hx of SIADH related hyponatremia as far back as 2010, improved with fluid restriction 3. Held further IVF and cont on 1200cc fluid restricted diet 4. Now much improved with Na in the low-mid 130's 5. Consider salt tabs as an outpatient 2. HTN  1. BP stable 3. Afib  1. On therapeutic lovenox 2. No signs of active bleed 4. Documented Hx of diabetes?  1. Listed as a diagnosis on previous clinic notes 2. Not on DM meds prior to admit 3. Daughter reported no history of actual  diabetes 4. A1c of 5.9 5. CBG's have been very well controlled in-hospital 6. Subsequentlyl discontinued SSI coverage 5. Back pain  1. On pain meds as needed 6. L arm fx  1. Orthotic remained in place 7. DVT prophylaxis  1. Therapeutic lovenox 8. Social  1. Pt lives alone and has baseline memory loss 2. PT/OT -> with recs for SNF 3. SW notes reviewed. Will likely have to be self-pay for ST SNF. Consideration for home PT on d/c instead.  Consultations:  Neurology  PT/OT  Discharge Exam: Filed Vitals:   08/05/13 2101 08/06/13 0626 08/06/13 2042 08/07/13 0403  BP: 154/52 143/53 134/45 134/42  Pulse: 81 78 83 77  Temp: 98.2 F (36.8 C) 97.5 F (36.4 C) 98.6 F (37 C) 98.2 F (36.8 C)  TempSrc: Oral Oral Oral Oral  Resp: 18 18    Height:      Weight:      SpO2: 98% 100% 97% 98%   General: Awake, in nad Cardiovascular: regular, s1, s2 Respiratory: normal resp effort, no wheezing  Discharge Instructions     Medication List         acetaminophen-codeine 300-30 MG per tablet  Commonly known as:  TYLENOL #3  Take 1 tablet by mouth every 6 (six) hours as needed for moderate pain.     ACIPHEX 20 MG tablet  Generic drug:  RABEprazole  Take 20 mg by mouth every morning.  amLODipine 5 MG tablet  Commonly known as:  NORVASC  Take 1 tablet (5 mg total) by mouth daily.     CARBATROL 100 MG 12 hr capsule  Generic drug:  carbamazepine  Take 200-300 mg by mouth 3 (three) times daily. Takes 300mg  in am, 200mg  at noon, 300mg  at bedtime (all after meals)     CARBATROL 100 MG 12 hr capsule  Generic drug:  carbamazepine  TAKE 3 caps BY MOUTH EVERY MORNING, 2 CAPS AT NOON, AND 3 caps AT BEDTIME. TAKE AFTER MEALS.     EPIPEN IJ  Inject as directed once as needed. For allergic reactions     EUCERIN SKIN CALMING 0.1 % Lotn  Generic drug:  Menthol  Apply 1 application topically daily as needed. For dry skin     folic acid 1 MG tablet  Commonly known as:  FOLVITE  Take  1 mg by mouth every morning.     furosemide 20 MG tablet  Commonly known as:  LASIX  Take 20 mg by mouth as needed for fluid.     Gabapentin (PHN) 300 MG Tabs  Take 300 mg by mouth at bedtime.     HEMOCYTE PLUS 106-1 MG Caps  Take 1 capsule by mouth daily.     HYDROcodone-acetaminophen 5-325 MG per tablet  Commonly known as:  NORCO/VICODIN  Take 1 tablet by mouth every 6 (six) hours as needed for moderate pain.     levothyroxine 75 MCG tablet  Commonly known as:  SYNTHROID, LEVOTHROID  Take 75 mcg by mouth daily before breakfast.     loratadine 10 MG tablet  Commonly known as:  CLARITIN  Take 10 mg by mouth daily.     LOVENOX 100 MG/ML injection  Generic drug:  enoxaparin  Inject 100 mg into the skin at bedtime.     meclizine 12.5 MG tablet  Commonly known as:  ANTIVERT  Take 12.5 mg by mouth 3 (three) times daily as needed. For dizziness     metaxalone 800 MG tablet  Commonly known as:  SKELAXIN  Take 0.5 tablets (400 mg total) by mouth 3 (three) times daily.     multivitamin tablet  Take 1 tablet by mouth every morning.     PRAVACHOL 40 MG tablet  Generic drug:  pravastatin  Take 40 mg by mouth every morning.     sodium chloride 1 G tablet  Take 1 tablet (1 g total) by mouth 2 (two) times daily with a meal.     Travoprost (BAK Free) 0.004 % Soln ophthalmic solution  Commonly known as:  TRAVATAN  Place 1 drop into both eyes at bedtime.     Vitamin D (Ergocalciferol) 50000 UNITS Caps capsule  Commonly known as:  DRISDOL  Take 50,000 Units by mouth every Saturday.       Allergies  Allergen Reactions  . Benadryl [Diphenhydramine Hcl] Hives and Shortness Of Breath  . Eye Drops [Tetrahydrazoline Hcl] Other (See Comments)    Burning, itching, swelling  . Flu Virus Vaccine Shortness Of Breath  . Iohexol Hives and Shortness Of Breath     Code: HIVES, Desc: PT IS ALLERGIC TO IVP DYE/IODINE/SHRIMP.  CAUSES SOB AND HIVES PER PT.  STEPHANIE, RT-R, Onset Date:  [id number]   . Shrimp [Shellfish Allergy] Anaphylaxis  . Voltaren [Diclofenac Sodium] Other (See Comments)    Acid reflux symptoms  . Ambien [Zolpidem Tartrate] Hives  . Gluten Meal Diarrhea  . Travatan Z [Travoprost] Other (See Comments)  BRAND NAME ONLY OLCANNOT TOLERATE GENERIC FORM OF EYE DROPS, IT CONTAINS PRESERVATIVES-SENSITIVITY  . Betadine Antibiotic-Moisturize [Bacitracin-Polymyxin B] Rash  . Biaxin [Clarithromycin] Hives  . Celebrex [Celecoxib] Rash  . Ciprofloxacin Hcl Rash       . Combigan [Brimonidine Tartrate-Timolol] Other (See Comments)    unknown  . Coumadin [Warfarin Sodium] Rash  . Ivp Dye [Iodinated Diagnostic Agents] Hives  . Levaquin [Levofloxacin In D5w] Rash  . Naprosyn [Naproxen] Rash  . Pataday [Olopatadine Hcl] Other (See Comments)  . Penicillins Hives  . Plavix [Clopidogrel Bisulfate] Rash  . Septra [Sulfamethoxazole-Tmp Ds] Hives   Follow-up Information   Follow up with [facility]. (home health physical and occupational therapy, nurse for eval, aide, and social worker)    Contact information:   [address] [place] [place] [address] [phone]       Follow up with Vidal Schwalbe, MD. Schedule an appointment as soon as possible for a visit in 1 week.   Specialty:  Family Medicine   Contact information:   [address], [place] [address] [phone]        The results of significant diagnostics from this hospitalization (including imaging, microbiology, ancillary and laboratory) are listed below for reference.    Significant Diagnostic Studies: Dg Chest 2 View  08/03/2013   CLINICAL DATA:  Weakness, fell 1 week ago, shortness of breath  EXAM: CHEST  2 VIEW  COMPARISON:  DG CHEST 1V PORT dated 03/06/2013  FINDINGS: The heart size and mediastinal contours are within normal limits. Both lungs are clear. The visualized skeletal structures are unremarkable.  IMPRESSION: No active cardiopulmonary  disease.   Electronically Signed   By: Skipper Cliche M.D.   On: 08/03/2013 20:32   Dg Lumbar Spine Complete  08/03/2013   CLINICAL DATA:  Right hip and low back pain starting today. Patient fell last week.  EXAM: LUMBAR SPINE - COMPLETE 4+ VIEW  COMPARISON:  DG LUMBAR SPINE COMPLETE dated 07/25/2013; CT VIRTUAL COLONOSCOPY DIAGNOSTIC dated 01/03/2010  FINDINGS: Mild endplate hypertrophic changes and degenerative changes in the facet joints There is no evidence of lumbar spine fracture. Alignment is normal. Intervertebral disc spaces are maintained. Incidental note of postoperative changes in the right upper quadrant and inferior vena caval filter.  IMPRESSION: Degenerative changes in the lumbar spine. No displaced fractures identified. .   Electronically Signed   By: Lucienne Capers M.D.   On: 08/03/2013 22:01   Dg Lumbar Spine Complete  07/25/2013   CLINICAL DATA:  Fall 2 days ago  EXAM: LUMBAR SPINE - COMPLETE 4+ VIEW  COMPARISON:  01/03/2010  FINDINGS: The bones are diffusely osteopenic. The alignment of the lumbar spine is normal. There is mild multi level degenerative disc disease. No fractures identified. IVC filter noted.  IMPRESSION: 1. No acute findings.   Electronically Signed   By: Kerby Moors M.D.   On: 07/25/2013 12:51   Dg Wrist Complete Left  07/23/2013   CLINICAL DATA:  Fall, left wrist pain  EXAM: LEFT WRIST - COMPLETE 3+ VIEW  COMPARISON:  None.  FINDINGS: Impacted distal radial metaphyseal fracture.  No definite intra-articular extension.  Mild degenerative changes involving the 1st carpometacarpal joint.  Mild soft tissue swelling.  IMPRESSION: Impacted distal radial fracture.   Electronically Signed   By: Julian Hy M.D.   On: 07/23/2013 19:04   Dg Hip Complete Right  08/03/2013   CLINICAL DATA:  Patient fell last week with right hip and  low back pain started today  EXAM: RIGHT HIP - COMPLETE 2+ VIEW  COMPARISON:  None.  FINDINGS: There is no evidence of hip fracture or  dislocation. There is no evidence of arthropathy or other focal bone abnormality.  IMPRESSION: Negative.   Electronically Signed   By: Esperanza Heir M.D.   On: 08/03/2013 22:00   Dg Hip Complete Right  07/25/2013   CLINICAL DATA:  Fall.  EXAM: RIGHT HIP - COMPLETE 2+ VIEW  COMPARISON:  None.  FINDINGS: No acute bony or joint abnormality.  Degenerative changes right hip.  IMPRESSION: No acute abnormality.  DJD.   Electronically Signed   By: Maisie Fus  Register   On: 07/25/2013 12:48   Ct Head Wo Contrast  07/23/2013   CLINICAL DATA:  Fall, head injury, laceration over left eyebrow  EXAM: CT HEAD WITHOUT CONTRAST  CT CERVICAL SPINE WITHOUT CONTRAST  TECHNIQUE: Multidetector CT imaging of the head and cervical spine was performed following the standard protocol without intravenous contrast. Multiplanar CT image reconstructions of the cervical spine were also generated.  COMPARISON:  CT head dated 10/30/2012  FINDINGS: CT HEAD FINDINGS  No evidence of parenchymal hemorrhage or extra-axial fluid collection. No mass lesion, mass effect, or midline shift.  No CT evidence of acute infarction.  Subcortical white matter and periventricular small vessel ischemic changes.  Mild age related atrophy.  No ventriculomegaly.  The visualized paranasal sinuses are essentially clear. The mastoid air cells are unopacified.  Mild soft tissue swelling/laceration overlying the left frontal bone (series 22/image 42).  No evidence of calvarial fracture.  CT CERVICAL SPINE FINDINGS  Exaggerated cervical lordosis.  No evidence of fracture or dislocation. Vertebral body heights and intervertebral disc spaces are maintained. Dens appears intact.  No prevertebral soft tissue swelling.  Visualized thyroid is mildly heterogeneous/ nodular.  Visualized lung apices are essentially clear.  IMPRESSION: Mild soft tissue swelling/laceration overlying the left frontal bone. No evidence of calvarial fracture.  No evidence of acute intracranial  abnormality.  No evidence of traumatic injury to the cervical spine.   Electronically Signed   By: Charline Bills M.D.   On: 07/23/2013 18:53   Ct Cervical Spine Wo Contrast  07/23/2013   CLINICAL DATA:  Fall, head injury, laceration over left eyebrow  EXAM: CT HEAD WITHOUT CONTRAST  CT CERVICAL SPINE WITHOUT CONTRAST  TECHNIQUE: Multidetector CT imaging of the head and cervical spine was performed following the standard protocol without intravenous contrast. Multiplanar CT image reconstructions of the cervical spine were also generated.  COMPARISON:  CT head dated 10/30/2012  FINDINGS: CT HEAD FINDINGS  No evidence of parenchymal hemorrhage or extra-axial fluid collection. No mass lesion, mass effect, or midline shift.  No CT evidence of acute infarction.  Subcortical white matter and periventricular small vessel ischemic changes.  Mild age related atrophy.  No ventriculomegaly.  The visualized paranasal sinuses are essentially clear. The mastoid air cells are unopacified.  Mild soft tissue swelling/laceration overlying the left frontal bone (series 22/image 42).  No evidence of calvarial fracture.  CT CERVICAL SPINE FINDINGS  Exaggerated cervical lordosis.  No evidence of fracture or dislocation. Vertebral body heights and intervertebral disc spaces are maintained. Dens appears intact.  No prevertebral soft tissue swelling.  Visualized thyroid is mildly heterogeneous/ nodular.  Visualized lung apices are essentially clear.  IMPRESSION: Mild soft tissue swelling/laceration overlying the left frontal bone. No evidence of calvarial fracture.  No evidence of acute intracranial abnormality.  No evidence of traumatic injury to the  cervical spine.   Electronically Signed   By: Julian Hy M.D.   On: 07/23/2013 18:53  Dg Hand Complete Left  07/23/2013   CLINICAL DATA:  Fall, left wrist pain  EXAM: LEFT HAND - COMPLETE 3+ VIEW  COMPARISON:  None.  FINDINGS: No fracture or dislocation is seen in the hand.  Transverse/impacted distal radial fracture.  The joint spaces are preserved.  The visualized soft tissues are unremarkable.  IMPRESSION: Impacted distal radial fracture.   Electronically Signed   By: Julian Hy M.D.   On: 07/23/2013 19:03    Microbiology: No results found for this or any previous visit (from the past 240 hour(s)).   Labs: Basic Metabolic Panel:  Recent Labs Lab 08/03/13 1526 08/04/13 0225 08/05/13 0329 08/06/13 0849 08/07/13 0350  NA 124* 126* 127* 133* 131*  K 4.5 4.6 4.5 4.1 4.4  CL 86* 92* 93* 94* 96  CO2 27 24 21 24 22   GLUCOSE 112* 102* 103* 123* 88  BUN 21 19 21 19 23   CREATININE 1.16* 1.13* 1.10 1.20* 1.21*  CALCIUM 9.0 8.8 8.8 9.7 9.0   Liver Function Tests:  Recent Labs Lab 08/03/13 1905  AST 16  ALT 10  ALKPHOS 107  BILITOT 0.2*  PROT 7.0  ALBUMIN 3.7   No results found for this basename: LIPASE, AMYLASE,  in the last 168 hours No results found for this basename: AMMONIA,  in the last 168 hours CBC:  Recent Labs Lab 08/03/13 1526 08/04/13 0225 08/06/13 0849  WBC 5.1 6.9 6.7  HGB 9.6* 9.1* 10.0*  HCT 28.0* 26.3* 29.3*  MCV 80.5 80.7 82.3  PLT 178 179 191   Cardiac Enzymes: No results found for this basename: CKTOTAL, CKMB, CKMBINDEX, TROPONINI,  in the last 168 hours BNP: BNP (last 3 results) No results found for this basename: PROBNP,  in the last 8760 hours CBG:  Recent Labs Lab 08/06/13 1127 08/06/13 1635 08/06/13 2129 08/07/13 0406 08/07/13 0627  GLUCAP 117* 118* 117* 98 90    Signed:  Donne Hazel  [facility] 08/07/2013, 9:33 AM

## 2013-08-07 NOTE — Discharge Instructions (Signed)
Hyponatremia   Hyponatremia is when the amount of salt (sodium) in your blood is too low. When sodium levels are low, your cells will absorb extra water and swell. The swelling happens throughout the body, but it mostly affects the brain. Severe brain swelling (cerebral edema), seizures, or coma can happen.   CAUSES    Heart, kidney, or liver problems.   Thyroid problems.   Adrenal gland problems.   Severe vomiting and diarrhea.   Certain medicines or illegal drugs.   Dehydration.   Drinking too much water.   Low-sodium diet.  SYMPTOMS    Nausea and vomiting.   Confusion.   Lethargy.   Agitation.   Headache.   Twitching or shaking (seizures).   Unconsciousness.   Appetite loss.   Muscle weakness and cramping.  DIAGNOSIS   Hyponatremia is identified by a simple blood test. Your caregiver will perform a history and physical exam to try to find the cause and type of hyponatremia. Other tests may be needed to measure the amount of sodium in your blood and urine.  TREATMENT   Treatment will depend on the cause.    Fluids may be given through the vein (IV).   Medicines may be used to correct the sodium imbalance. If medicines are causing the problem, they will need to be adjusted.   Water or fluid intake may be restricted to restore proper balance.  The speed of correcting the sodium problem is very important. If the problem is corrected too fast, nerve damage (sometimes unchangeable) can happen.  HOME CARE INSTRUCTIONS    Only take medicines as directed by your caregiver. Many medicines can make hyponatremia worse. Discuss all your medicines with your caregiver.   Carefully follow any recommended diet, including any fluid restrictions.   You may be asked to repeat lab tests. Follow these directions.   Avoid alcohol and recreational drugs.  SEEK MEDICAL CARE IF:    You develop worsening nausea, fatigue, headache, confusion, or weakness.   Your original hyponatremia symptoms return.   You have  problems following the recommended diet.  SEEK IMMEDIATE MEDICAL CARE IF:    You have a seizure.   You faint.   You have ongoing diarrhea or vomiting.  MAKE SURE YOU:    Understand these instructions.   Will watch your condition.   Will get help right away if you are not doing well or get worse.  Document Released: 03/01/2002 Document Revised: 06/03/2011 Document Reviewed: 08/26/2010  [employer] Patient Information 2014 [employer]

## 2013-08-08 DIAGNOSIS — I4891 Unspecified atrial fibrillation: Secondary | ICD-10-CM | POA: Diagnosis not present

## 2013-08-08 DIAGNOSIS — Z9181 History of falling: Secondary | ICD-10-CM | POA: Diagnosis not present

## 2013-08-08 DIAGNOSIS — M549 Dorsalgia, unspecified: Secondary | ICD-10-CM | POA: Diagnosis not present

## 2013-08-08 DIAGNOSIS — E119 Type 2 diabetes mellitus without complications: Secondary | ICD-10-CM | POA: Diagnosis not present

## 2013-08-08 DIAGNOSIS — Z7901 Long term (current) use of anticoagulants: Secondary | ICD-10-CM | POA: Diagnosis not present

## 2013-08-08 DIAGNOSIS — S42309D Unspecified fracture of shaft of humerus, unspecified arm, subsequent encounter for fracture with routine healing: Secondary | ICD-10-CM | POA: Diagnosis not present

## 2013-08-09 DIAGNOSIS — M545 Low back pain, unspecified: Secondary | ICD-10-CM | POA: Diagnosis not present

## 2013-08-09 DIAGNOSIS — M25539 Pain in unspecified wrist: Secondary | ICD-10-CM | POA: Diagnosis not present

## 2013-08-10 DIAGNOSIS — E119 Type 2 diabetes mellitus without complications: Secondary | ICD-10-CM | POA: Diagnosis not present

## 2013-08-10 DIAGNOSIS — M549 Dorsalgia, unspecified: Secondary | ICD-10-CM | POA: Diagnosis not present

## 2013-08-10 DIAGNOSIS — S42309D Unspecified fracture of shaft of humerus, unspecified arm, subsequent encounter for fracture with routine healing: Secondary | ICD-10-CM | POA: Diagnosis not present

## 2013-08-10 DIAGNOSIS — Z7901 Long term (current) use of anticoagulants: Secondary | ICD-10-CM | POA: Diagnosis not present

## 2013-08-10 DIAGNOSIS — Z9181 History of falling: Secondary | ICD-10-CM | POA: Diagnosis not present

## 2013-08-10 DIAGNOSIS — I4891 Unspecified atrial fibrillation: Secondary | ICD-10-CM | POA: Diagnosis not present

## 2013-08-10 NOTE — Progress Notes (Addendum)
OT NOTE- LATE GCODE ENTRY   08/05/13 1300  OT G-codes **NOT FOR INPATIENT CLASS**  Functional Assessment Tool Used clinical judgement  Functional Limitation Self care  Self Care Current Status (E9381) CM  Self Care Goal Status (O1751) CM      Jeri Modena   OTR/L Pager: [phone] Office: [phone] .

## 2013-08-11 ENCOUNTER — Ambulatory Visit: Payer: Medicare Other | Admitting: Neurology

## 2013-08-11 DIAGNOSIS — I4891 Unspecified atrial fibrillation: Secondary | ICD-10-CM | POA: Diagnosis not present

## 2013-08-11 DIAGNOSIS — M549 Dorsalgia, unspecified: Secondary | ICD-10-CM | POA: Diagnosis not present

## 2013-08-11 DIAGNOSIS — Z9181 History of falling: Secondary | ICD-10-CM | POA: Diagnosis not present

## 2013-08-11 DIAGNOSIS — S42309D Unspecified fracture of shaft of humerus, unspecified arm, subsequent encounter for fracture with routine healing: Secondary | ICD-10-CM | POA: Diagnosis not present

## 2013-08-11 DIAGNOSIS — Z7901 Long term (current) use of anticoagulants: Secondary | ICD-10-CM | POA: Diagnosis not present

## 2013-08-11 DIAGNOSIS — E119 Type 2 diabetes mellitus without complications: Secondary | ICD-10-CM | POA: Diagnosis not present

## 2013-08-12 ENCOUNTER — Ambulatory Visit (INDEPENDENT_AMBULATORY_CARE_PROVIDER_SITE_OTHER): Payer: Medicare Other | Admitting: Neurology

## 2013-08-12 ENCOUNTER — Encounter: Payer: Self-pay | Admitting: Neurology

## 2013-08-12 VITALS — BP 122/68 | HR 70 | Temp 97.5°F | Resp 16 | Ht 65.0 in | Wt 218.9 lb

## 2013-08-12 DIAGNOSIS — R413 Other amnesia: Secondary | ICD-10-CM

## 2013-08-12 DIAGNOSIS — Z7901 Long term (current) use of anticoagulants: Secondary | ICD-10-CM | POA: Diagnosis not present

## 2013-08-12 DIAGNOSIS — E871 Hypo-osmolality and hyponatremia: Secondary | ICD-10-CM

## 2013-08-12 DIAGNOSIS — I4891 Unspecified atrial fibrillation: Secondary | ICD-10-CM | POA: Diagnosis not present

## 2013-08-12 DIAGNOSIS — I6350 Cerebral infarction due to unspecified occlusion or stenosis of unspecified cerebral artery: Secondary | ICD-10-CM | POA: Diagnosis not present

## 2013-08-12 DIAGNOSIS — S42309D Unspecified fracture of shaft of humerus, unspecified arm, subsequent encounter for fracture with routine healing: Secondary | ICD-10-CM | POA: Diagnosis not present

## 2013-08-12 DIAGNOSIS — B0222 Postherpetic trigeminal neuralgia: Secondary | ICD-10-CM

## 2013-08-12 DIAGNOSIS — M549 Dorsalgia, unspecified: Secondary | ICD-10-CM | POA: Diagnosis not present

## 2013-08-12 DIAGNOSIS — Z9181 History of falling: Secondary | ICD-10-CM | POA: Diagnosis not present

## 2013-08-12 DIAGNOSIS — E119 Type 2 diabetes mellitus without complications: Secondary | ICD-10-CM | POA: Diagnosis not present

## 2013-08-12 NOTE — Progress Notes (Signed)
NEUROLOGY CONSULTATION NOTE  [name] MRN: [id number] DOB: 1929/12/01  Referring provider: Dr. Dema Severin Primary care provider: Dr. Dema Severin  Reason for consult:  Memory problems, post-herpetic trigeminal neuralgia  HISTORY OF PRESENT ILLNESS: [name] is an 80 year old right-handed woman with history of hypertension, CAD, atrial fibrillation, hypercholesterolemia, diabetes mellitus, DVT and pulmonary embolism (on Lovenox), migraine, herpes zoster, sleep apnea, gait disorder, memory problems and anxiety who presents for dementia.  She was previously managed by [facility].  Records and images personally reviewed.  She is accompanied by her daughter.  Records and images personally reviewed.  I. Atypical right facial pain: Onset of symptoms started in 2006, following episode of herpes zoster involving she describes sharp pain involving the the right V1 and V2 distribution, associated with allodynia. Chewing, talking, and brushing her teeth triggers and exacerbates the pain.  She was subsequently treated for postherpetic neuralgia.    Past medications:   carbamazepine generic which led to breakthrough pain.    Current medication:  Carbatrol 313m/200mg/300mg, Gabapentin 3057mat bedtime.  Doing well on this regimen as far as pain control.  Hasn't had an attack since August 2014.  09/23/08 MRI BRAIN WO: Small vessel disease, remote infarcts left caudate and left cerebellum 09/23/08 MRA/MRV HEAD:  Unremarkable.  07/23/13 CT Head:  Small vessel ischemic changes with old strokes in the left cerebellar hemisphere and body of caudate, stable from prior studies.  LABS: 03/06/13 TSH 1.012 03/07/13 LDL 75 04/22/13 carbamazepine level 7 08/03/13 TP 7, ALB 3.7, Alk Phos 107, AST 16, ALT 10, TB 0.2 08/05/13 Hgb A1c 5.9 08/06/13 WBC 6.7, HGB 10, HCT 29.3, PLT 191, MCV 82.3 08/07/13:  Na 131, K 4.4, Cl 96, bicarb 22, BUN 23, Cr 1.21, Ca 9  II.  Cognitive problems: This started about a year ago after  initiation of gabapentin. She tends to repeat herself often, such as telling stories. She repeats questions often. She has no difficulty typically with performing ADLs. She typically lives by herself in her one story house. She manages her own finances. She doesn't drive 2 to remote history of blackout spells. She typically does not have problems recognizing people or recalling names. She denies any depression. She denies any hallucinations. She does have history of vivid nightmares. Earlier this month, she fell bruising her hip and fracturing her arm. For the last month, she has been taking Vicodin. Since starting these pain medications, she is had more cognitive clouding. She has had some word finding difficulties. Since recent hospitalization, a family friend stays with her at night time. She reports a maternal aunt with history of dementia.  Of note, she has history of SIADH, in which she has had excessive urination. Earlier this month, she was found to be hyponatremic, thought to be due to dehydration. Her sodium was at 124. This was corrected. Most recent labs from 08/07/13 revealed sodium of 131 and creatinine of 1.21.  PAST MEDICAL HISTORY: Past Medical History  Diagnosis Date  . Shingles     zoster 1990 on the back and on the abdomen in 2007,facial zoster 2006  . Hypertension   . A-fib   . Diabetes mellitus without complication   . Glaucoma   . Pulmonary embolism recurrent     IVC filter  . Acid reflux   . Arthritis   . Migraine   . Postherpetic trigeminal neuralgia 10/20/2012  . Unspecified cerebral artery occlusion with cerebral infarction   . Memory loss   . Chronic anticoagulation  03/07/2013    Lovenox 100 QD  . Junctional bradycardia     resolved with discontinuation of sotalol (2014) after 10 yrs  . DVT (deep venous thrombosis) 2007    recurrent w PE s/p [place] filter  . Hypoglycemia   . GERD (gastroesophageal reflux disease)     w hiatal hernia endoscopy 2003, Dr Penelope Coop   . Diverticulosis   . Phlebitis   . Osteoarthritis     in Bilateral knees and back. -Dr Latanya Maudlin  . Panic attacks   . Anxiety   . IBS (irritable bowel syndrome)   . Hypothyroidism     goiter  . OSA (obstructive sleep apnea) Dx: 2008    could never use CPAP repeat study showed no sleep apnea (HST 03-03-13 ESS 8, AHI 2/hr)  . Vitamin D deficiency   . CVA (cerebral infarction) 1964  . Osteoporosis   . Trigeminal neuralgia     r face--Dr Dohmeier  . Hypercholesterolemia   . Chronic kidney disease     Stage III  . Dementia     Dr Brett Fairy    PAST SURGICAL HISTORY: Past Surgical History  Procedure Laterality Date  . Blood clots  06/2006    lower abdomen  . Pulmonary embolism surgery  03/2005  . Phlebitis  12/2005  . Cholecystectomy  01/1983  . Biopsies of breast Bilateral 1971  . Migraine speech impairment  07/1963  . Hemorrhoid surgery  1966  . Appendectomy  1945  . Tonsillectomy      1954  . Gallbladder surgery  1980  . Insertion of vena cova filter  2007  . Leg / ankle soft tissue biopsy Right     precancerous  . Cardiac catheterization  08/2001    A-fib/Palpitations Dr. Daneen Schick  . Dilation and curettage, diagnostic / therapeutic  1952/1977    x2  . Foot surgery Right 1978    Cyst removed from Right Foot    MEDICATIONS: Current Outpatient Prescriptions on File Prior to Visit  Medication Sig Dispense Refill  . acetaminophen-codeine (TYLENOL #3) 300-30 MG per tablet Take 1 tablet by mouth every 6 (six) hours as needed for moderate pain.  30 tablet  0  . amLODipine (NORVASC) 5 MG tablet Take 1 tablet (5 mg total) by mouth daily.  30 tablet  11  . carbamazepine (CARBATROL) 100 MG 12 hr capsule Take 200-300 mg by mouth 3 (three) times daily. Takes $RemoveBefo'300mg'wRzwrxDsaTi$  in am, $Remo'200mg'gsUej$  at noon, $Remove'300mg'WPQzrSC$  at bedtime (all after meals)      . CARBATROL 100 MG 12 hr capsule TAKE 3 caps BY MOUTH EVERY MORNING, 2 CAPS AT NOON, AND 3 caps AT BEDTIME. TAKE AFTER MEALS.  240 capsule  3  . enoxaparin  (LOVENOX) 100 MG/ML injection Inject 100 mg into the skin at bedtime.       Marland Kitchen EPINEPHrine (EPIPEN IJ) Inject as directed once as needed. For allergic reactions      . Fe Fum-FA-B Cmp-C-Zn-Mg-Mn-Cu (HEMOCYTE PLUS) 106-1 MG CAPS Take 1 capsule by mouth daily.       . folic acid (FOLVITE) 1 MG tablet Take 1 mg by mouth every morning.       . furosemide (LASIX) 20 MG tablet Take 20 mg by mouth as needed for fluid.       . Gabapentin, PHN, 300 MG TABS Take 300 mg by mouth at bedtime.      Marland Kitchen HYDROcodone-acetaminophen (NORCO/VICODIN) 5-325 MG per tablet Take 1 tablet by mouth every 6 (six) hours  as needed for moderate pain.      Marland Kitchen levothyroxine (SYNTHROID, LEVOTHROID) 75 MCG tablet Take 75 mcg by mouth daily before breakfast.       . loratadine (CLARITIN) 10 MG tablet Take 10 mg by mouth daily.      . meclizine (ANTIVERT) 12.5 MG tablet Take 12.5 mg by mouth 3 (three) times daily as needed. For dizziness      . Menthol (EUCERIN SKIN CALMING) 0.1 % LOTN Apply 1 application topically daily as needed. For dry skin      . metaxalone (SKELAXIN) 800 MG tablet Take 0.5 tablets (400 mg total) by mouth 3 (three) times daily.  90 tablet  0  . Multiple Vitamin (MULTIVITAMIN) tablet Take 1 tablet by mouth every morning.       . pravastatin (PRAVACHOL) 40 MG tablet Take 40 mg by mouth every morning.       . RABEprazole (ACIPHEX) 20 MG tablet Take 20 mg by mouth every morning.       . sodium chloride 1 G tablet Take 1 tablet (1 g total) by mouth 2 (two) times daily with a meal.  60 tablet  0  . Travoprost, BAK Free, (TRAVATAN) 0.004 % SOLN ophthalmic solution Place 1 drop into both eyes at bedtime.      . Vitamin D, Ergocalciferol, (DRISDOL) 50000 UNITS CAPS capsule Take 50,000 Units by mouth every Saturday.        No current facility-administered medications on file prior to visit.    ALLERGIES: Allergies  Allergen Reactions  . Benadryl [Diphenhydramine Hcl] Hives and Shortness Of Breath  . Eye Drops  [Tetrahydrazoline Hcl] Other (See Comments)    Burning, itching, swelling  . Flu Virus Vaccine Shortness Of Breath  . Iohexol Hives and Shortness Of Breath     Code: HIVES, Desc: PT IS ALLERGIC TO IVP DYE/IODINE/SHRIMP.  CAUSES SOB AND HIVES PER PT.  STEPHANIE, RT-R, Onset Date: [id number]   . Shrimp [Shellfish Allergy] Anaphylaxis  . Voltaren [Diclofenac Sodium] Other (See Comments)    Acid reflux symptoms  . Ambien [Zolpidem Tartrate] Hives  . Gluten Meal Diarrhea  . Travatan Z [Travoprost] Other (See Comments)    BRAND NAME ONLY OLCANNOT TOLERATE GENERIC FORM OF EYE DROPS, IT CONTAINS PRESERVATIVES-SENSITIVITY  . Betadine Antibiotic-Moisturize [Bacitracin-Polymyxin B] Rash  . Biaxin [Clarithromycin] Hives  . Celebrex [Celecoxib] Rash  . Ciprofloxacin Hcl Rash       . Combigan [Brimonidine Tartrate-Timolol] Other (See Comments)    unknown  . Coumadin [Warfarin Sodium] Rash  . Ivp Dye [Iodinated Diagnostic Agents] Hives  . Levaquin [Levofloxacin In D5w] Rash  . Naprosyn [Naproxen] Rash  . Pataday [Olopatadine Hcl] Other (See Comments)  . Penicillins Hives  . Plavix [Clopidogrel Bisulfate] Rash  . Septra [Sulfamethoxazole-Tmp Ds] Hives    FAMILY HISTORY: Family History  Problem Relation Age of Onset  . Diabetes Mother   . Hypertension Mother   . Glaucoma Mother   . CVA Mother   . CAD Mother   . Hypertension Father   . Heart attack Father   . Kidney failure Father   . Heart attack Brother     2 half brothers  . Hypertension Brother   . CVA Brother   . Glaucoma Other   . Glaucoma Other   . Breast cancer Maternal Aunt   . Colon cancer Maternal Uncle   . Rectal cancer Maternal Uncle   . Prostate cancer Maternal Grandfather     SOCIAL HISTORY: History  Social History  . Marital Status: Divorced    Spouse Name: N/A    Number of Children: 3  . Years of Education: 14   Occupational History  . retired     Armed forces training and education officer   Social History Main Topics    . Smoking status: Never Smoker   . Smokeless tobacco: Never Used  . Alcohol Use: Yes     Comment: very moderate, none  in the last two months  . Drug Use: No  . Sexual Activity: Not on file   Other Topics Concern  . Not on file   Social History Narrative   Patient is retired and lives at home alone. Patient has a college education.    Patient has three children.   Patient is right-handed.   Patient drinks 1-2 cups of caffeine daily.    REVIEW OF SYSTEMS: Constitutional: No fevers, chills, or sweats, no generalized fatigue, change in appetite Eyes: No visual changes, double vision, eye pain Ear, nose and throat: No hearing loss, ear pain, nasal congestion, sore throat Cardiovascular: No chest pain, palpitations Respiratory:  No shortness of breath at rest or with exertion, wheezes GastrointestinaI: No nausea, vomiting, diarrhea, abdominal pain, fecal incontinence Genitourinary:  No dysuria, urinary retention or frequency Musculoskeletal:  No neck pain, back pain Integumentary: No rash, pruritus, skin lesions Neurological: as above Psychiatric: No depression, insomnia, anxiety Endocrine: No palpitations, fatigue, diaphoresis, mood swings, change in appetite, change in weight, increased thirst Hematologic/Lymphatic:  No anemia, purpura, petechiae. Allergic/Immunologic: no itchy/runny eyes, nasal congestion, recent allergic reactions, rashes  PHYSICAL EXAM: Filed Vitals:   08/12/13 1035  BP: 122/68  Pulse: 70  Temp: 97.5 F (36.4 C)  Resp: 16   General: No acute distress Head:  Normocephalic/atraumatic Neck: supple, no paraspinal tenderness, full range of motion Back: No paraspinal tenderness Heart: regular rate and rhythm Lungs: Clear to auscultation bilaterally. Vascular: No carotid bruits. Neurological Exam: Mental status: alert and oriented to person, place, and time (except season), recent and remote memory intact (missed 1 of 3 words), fund of knowledge intact,  attention and concentration intact, speech fluent and not dysarthric, language intact. Serial 7 subtraction intact. Able to copy intersecting pentagons correctly. Able to draw a clock but had difficulty drawing the hands to the requested time. MMSE 28/30. Cranial nerves: CN I: not tested CN II: pupils equal, round and reactive to light, visual fields intact, fundi unremarkable, without vessel changes, exudates, hemorrhages or papilledema. CN III, IV, VI:  full range of motion, no nystagmus, no ptosis CN V: facial sensation intact CN VII: upper and lower face symmetric CN VIII: hearing intact CN IX, X: gag intact, uvula midline CN XI: sternocleidomastoid and trapezius muscles intact CN XII: tongue midline Bulk & Tone: normal, no fasciculations. Motor: 5 out of 5 throughout except 5 minus out of 5 in hip flexors. Sensation: Temperature and vibration intact Deep Tendon Reflexes: 2+ throughout, toes down Finger to nose testing: No dysmetria Gait: Has difficulty standing up out of a chair. Requires use of a walker. Romberg easily positive.  IMPRESSION: Right postherpetic trigeminal neuralgia Memory problems.  Very likely due to medication-effect given onset of symptoms.  Does not exhibit signs of dementia. The only way to tell if gabapentin may be contributing is to stop the gabapentin. However, she is at high risk for exacerbation of her postherpetic trigeminal neuralgia. Hyponatremia. Has history of SIADH. Carbatrol may also cause hyponatremia, but with history of SIADH it is hard to tell.  PLAN:  1. I would not make any changes to the Carbatrol and gabapentin at this time. 2. I would like to see her back in 3 months after she has finished her pain medications for reevaluation. 3. If we decide to taper off any of her neuropathic medications, other options may include Lamictal, Lyrica, or baclofen.  Thank you for allowing me to take part in the care of this patient.  Metta Clines, DO  CC:  [name], MD

## 2013-08-12 NOTE — Patient Instructions (Signed)
I don't see any evidence of dementia.  Definitely the gabapentin may be contributing to your memory but any changes in those medications for the neuralgia may exacerbate another pain attack.  Let's not make any changes at this time.  I want to see you back in 3 months for re-evaluation after you are off of some of the pain medications, and we can take it from there.

## 2013-08-13 DIAGNOSIS — S42309D Unspecified fracture of shaft of humerus, unspecified arm, subsequent encounter for fracture with routine healing: Secondary | ICD-10-CM | POA: Diagnosis not present

## 2013-08-13 DIAGNOSIS — M549 Dorsalgia, unspecified: Secondary | ICD-10-CM | POA: Diagnosis not present

## 2013-08-13 DIAGNOSIS — Z7901 Long term (current) use of anticoagulants: Secondary | ICD-10-CM | POA: Diagnosis not present

## 2013-08-13 DIAGNOSIS — Z9181 History of falling: Secondary | ICD-10-CM | POA: Diagnosis not present

## 2013-08-13 DIAGNOSIS — E119 Type 2 diabetes mellitus without complications: Secondary | ICD-10-CM | POA: Diagnosis not present

## 2013-08-13 DIAGNOSIS — I4891 Unspecified atrial fibrillation: Secondary | ICD-10-CM | POA: Diagnosis not present

## 2013-08-16 DIAGNOSIS — S42309D Unspecified fracture of shaft of humerus, unspecified arm, subsequent encounter for fracture with routine healing: Secondary | ICD-10-CM | POA: Diagnosis not present

## 2013-08-16 DIAGNOSIS — M549 Dorsalgia, unspecified: Secondary | ICD-10-CM | POA: Diagnosis not present

## 2013-08-16 DIAGNOSIS — Z9181 History of falling: Secondary | ICD-10-CM | POA: Diagnosis not present

## 2013-08-16 DIAGNOSIS — I4891 Unspecified atrial fibrillation: Secondary | ICD-10-CM | POA: Diagnosis not present

## 2013-08-16 DIAGNOSIS — Z7901 Long term (current) use of anticoagulants: Secondary | ICD-10-CM | POA: Diagnosis not present

## 2013-08-16 DIAGNOSIS — E119 Type 2 diabetes mellitus without complications: Secondary | ICD-10-CM | POA: Diagnosis not present

## 2013-08-17 DIAGNOSIS — Z9181 History of falling: Secondary | ICD-10-CM | POA: Diagnosis not present

## 2013-08-17 DIAGNOSIS — S42309D Unspecified fracture of shaft of humerus, unspecified arm, subsequent encounter for fracture with routine healing: Secondary | ICD-10-CM | POA: Diagnosis not present

## 2013-08-17 DIAGNOSIS — M549 Dorsalgia, unspecified: Secondary | ICD-10-CM | POA: Diagnosis not present

## 2013-08-17 DIAGNOSIS — I4891 Unspecified atrial fibrillation: Secondary | ICD-10-CM | POA: Diagnosis not present

## 2013-08-17 DIAGNOSIS — E119 Type 2 diabetes mellitus without complications: Secondary | ICD-10-CM | POA: Diagnosis not present

## 2013-08-17 DIAGNOSIS — Z7901 Long term (current) use of anticoagulants: Secondary | ICD-10-CM | POA: Diagnosis not present

## 2013-08-18 DIAGNOSIS — M545 Low back pain, unspecified: Secondary | ICD-10-CM | POA: Diagnosis not present

## 2013-08-18 DIAGNOSIS — M549 Dorsalgia, unspecified: Secondary | ICD-10-CM | POA: Diagnosis not present

## 2013-08-18 DIAGNOSIS — S5290XA Unspecified fracture of unspecified forearm, initial encounter for closed fracture: Secondary | ICD-10-CM | POA: Diagnosis not present

## 2013-08-18 DIAGNOSIS — D638 Anemia in other chronic diseases classified elsewhere: Secondary | ICD-10-CM | POA: Diagnosis not present

## 2013-08-18 DIAGNOSIS — S42309D Unspecified fracture of shaft of humerus, unspecified arm, subsequent encounter for fracture with routine healing: Secondary | ICD-10-CM | POA: Diagnosis not present

## 2013-08-18 DIAGNOSIS — I4891 Unspecified atrial fibrillation: Secondary | ICD-10-CM | POA: Diagnosis not present

## 2013-08-18 DIAGNOSIS — Z7901 Long term (current) use of anticoagulants: Secondary | ICD-10-CM | POA: Diagnosis not present

## 2013-08-18 DIAGNOSIS — E871 Hypo-osmolality and hyponatremia: Secondary | ICD-10-CM | POA: Diagnosis not present

## 2013-08-18 DIAGNOSIS — Z9181 History of falling: Secondary | ICD-10-CM | POA: Diagnosis not present

## 2013-08-18 DIAGNOSIS — E119 Type 2 diabetes mellitus without complications: Secondary | ICD-10-CM | POA: Diagnosis not present

## 2013-08-19 DIAGNOSIS — M549 Dorsalgia, unspecified: Secondary | ICD-10-CM | POA: Diagnosis not present

## 2013-08-19 DIAGNOSIS — Z9181 History of falling: Secondary | ICD-10-CM | POA: Diagnosis not present

## 2013-08-19 DIAGNOSIS — S42309D Unspecified fracture of shaft of humerus, unspecified arm, subsequent encounter for fracture with routine healing: Secondary | ICD-10-CM | POA: Diagnosis not present

## 2013-08-19 DIAGNOSIS — E119 Type 2 diabetes mellitus without complications: Secondary | ICD-10-CM | POA: Diagnosis not present

## 2013-08-19 DIAGNOSIS — Z7901 Long term (current) use of anticoagulants: Secondary | ICD-10-CM | POA: Diagnosis not present

## 2013-08-19 DIAGNOSIS — I4891 Unspecified atrial fibrillation: Secondary | ICD-10-CM | POA: Diagnosis not present

## 2013-08-20 DIAGNOSIS — Z9181 History of falling: Secondary | ICD-10-CM | POA: Diagnosis not present

## 2013-08-20 DIAGNOSIS — E119 Type 2 diabetes mellitus without complications: Secondary | ICD-10-CM | POA: Diagnosis not present

## 2013-08-20 DIAGNOSIS — Z7901 Long term (current) use of anticoagulants: Secondary | ICD-10-CM | POA: Diagnosis not present

## 2013-08-20 DIAGNOSIS — M549 Dorsalgia, unspecified: Secondary | ICD-10-CM | POA: Diagnosis not present

## 2013-08-20 DIAGNOSIS — S42309D Unspecified fracture of shaft of humerus, unspecified arm, subsequent encounter for fracture with routine healing: Secondary | ICD-10-CM | POA: Diagnosis not present

## 2013-08-20 DIAGNOSIS — I4891 Unspecified atrial fibrillation: Secondary | ICD-10-CM | POA: Diagnosis not present

## 2013-08-23 DIAGNOSIS — M549 Dorsalgia, unspecified: Secondary | ICD-10-CM | POA: Diagnosis not present

## 2013-08-23 DIAGNOSIS — Z9181 History of falling: Secondary | ICD-10-CM | POA: Diagnosis not present

## 2013-08-23 DIAGNOSIS — S42309D Unspecified fracture of shaft of humerus, unspecified arm, subsequent encounter for fracture with routine healing: Secondary | ICD-10-CM | POA: Diagnosis not present

## 2013-08-23 DIAGNOSIS — Z7901 Long term (current) use of anticoagulants: Secondary | ICD-10-CM | POA: Diagnosis not present

## 2013-08-23 DIAGNOSIS — I4891 Unspecified atrial fibrillation: Secondary | ICD-10-CM | POA: Diagnosis not present

## 2013-08-23 DIAGNOSIS — E119 Type 2 diabetes mellitus without complications: Secondary | ICD-10-CM | POA: Diagnosis not present

## 2013-08-24 DIAGNOSIS — Z9181 History of falling: Secondary | ICD-10-CM | POA: Diagnosis not present

## 2013-08-24 DIAGNOSIS — M549 Dorsalgia, unspecified: Secondary | ICD-10-CM | POA: Diagnosis not present

## 2013-08-24 DIAGNOSIS — I4891 Unspecified atrial fibrillation: Secondary | ICD-10-CM | POA: Diagnosis not present

## 2013-08-24 DIAGNOSIS — Z7901 Long term (current) use of anticoagulants: Secondary | ICD-10-CM | POA: Diagnosis not present

## 2013-08-24 DIAGNOSIS — E119 Type 2 diabetes mellitus without complications: Secondary | ICD-10-CM | POA: Diagnosis not present

## 2013-08-24 DIAGNOSIS — S42309D Unspecified fracture of shaft of humerus, unspecified arm, subsequent encounter for fracture with routine healing: Secondary | ICD-10-CM | POA: Diagnosis not present

## 2013-08-25 DIAGNOSIS — S42309D Unspecified fracture of shaft of humerus, unspecified arm, subsequent encounter for fracture with routine healing: Secondary | ICD-10-CM | POA: Diagnosis not present

## 2013-08-25 DIAGNOSIS — M549 Dorsalgia, unspecified: Secondary | ICD-10-CM | POA: Diagnosis not present

## 2013-08-25 DIAGNOSIS — I4891 Unspecified atrial fibrillation: Secondary | ICD-10-CM | POA: Diagnosis not present

## 2013-08-25 DIAGNOSIS — E119 Type 2 diabetes mellitus without complications: Secondary | ICD-10-CM | POA: Diagnosis not present

## 2013-08-25 DIAGNOSIS — Z7901 Long term (current) use of anticoagulants: Secondary | ICD-10-CM | POA: Diagnosis not present

## 2013-08-25 DIAGNOSIS — Z9181 History of falling: Secondary | ICD-10-CM | POA: Diagnosis not present

## 2013-08-26 DIAGNOSIS — E119 Type 2 diabetes mellitus without complications: Secondary | ICD-10-CM | POA: Diagnosis not present

## 2013-08-26 DIAGNOSIS — M549 Dorsalgia, unspecified: Secondary | ICD-10-CM | POA: Diagnosis not present

## 2013-08-26 DIAGNOSIS — Z7901 Long term (current) use of anticoagulants: Secondary | ICD-10-CM | POA: Diagnosis not present

## 2013-08-26 DIAGNOSIS — S42309D Unspecified fracture of shaft of humerus, unspecified arm, subsequent encounter for fracture with routine healing: Secondary | ICD-10-CM | POA: Diagnosis not present

## 2013-08-26 DIAGNOSIS — Z9181 History of falling: Secondary | ICD-10-CM | POA: Diagnosis not present

## 2013-08-26 DIAGNOSIS — I4891 Unspecified atrial fibrillation: Secondary | ICD-10-CM | POA: Diagnosis not present

## 2013-08-27 DIAGNOSIS — Z9181 History of falling: Secondary | ICD-10-CM | POA: Diagnosis not present

## 2013-08-27 DIAGNOSIS — Z7901 Long term (current) use of anticoagulants: Secondary | ICD-10-CM | POA: Diagnosis not present

## 2013-08-27 DIAGNOSIS — E119 Type 2 diabetes mellitus without complications: Secondary | ICD-10-CM | POA: Diagnosis not present

## 2013-08-27 DIAGNOSIS — I4891 Unspecified atrial fibrillation: Secondary | ICD-10-CM | POA: Diagnosis not present

## 2013-08-27 DIAGNOSIS — M549 Dorsalgia, unspecified: Secondary | ICD-10-CM | POA: Diagnosis not present

## 2013-08-27 DIAGNOSIS — S42309D Unspecified fracture of shaft of humerus, unspecified arm, subsequent encounter for fracture with routine healing: Secondary | ICD-10-CM | POA: Diagnosis not present

## 2013-08-30 DIAGNOSIS — G5600 Carpal tunnel syndrome, unspecified upper limb: Secondary | ICD-10-CM | POA: Diagnosis not present

## 2013-08-30 DIAGNOSIS — S42309D Unspecified fracture of shaft of humerus, unspecified arm, subsequent encounter for fracture with routine healing: Secondary | ICD-10-CM | POA: Diagnosis not present

## 2013-08-30 DIAGNOSIS — Z9181 History of falling: Secondary | ICD-10-CM | POA: Diagnosis not present

## 2013-08-30 DIAGNOSIS — E119 Type 2 diabetes mellitus without complications: Secondary | ICD-10-CM | POA: Diagnosis not present

## 2013-08-30 DIAGNOSIS — I4891 Unspecified atrial fibrillation: Secondary | ICD-10-CM | POA: Diagnosis not present

## 2013-08-30 DIAGNOSIS — Z7901 Long term (current) use of anticoagulants: Secondary | ICD-10-CM | POA: Diagnosis not present

## 2013-08-30 DIAGNOSIS — M25539 Pain in unspecified wrist: Secondary | ICD-10-CM | POA: Diagnosis not present

## 2013-08-30 DIAGNOSIS — M549 Dorsalgia, unspecified: Secondary | ICD-10-CM | POA: Diagnosis not present

## 2013-09-01 DIAGNOSIS — Z7901 Long term (current) use of anticoagulants: Secondary | ICD-10-CM | POA: Diagnosis not present

## 2013-09-01 DIAGNOSIS — M549 Dorsalgia, unspecified: Secondary | ICD-10-CM | POA: Diagnosis not present

## 2013-09-01 DIAGNOSIS — E119 Type 2 diabetes mellitus without complications: Secondary | ICD-10-CM | POA: Diagnosis not present

## 2013-09-01 DIAGNOSIS — I4891 Unspecified atrial fibrillation: Secondary | ICD-10-CM | POA: Diagnosis not present

## 2013-09-01 DIAGNOSIS — Z9181 History of falling: Secondary | ICD-10-CM | POA: Diagnosis not present

## 2013-09-01 DIAGNOSIS — S42309D Unspecified fracture of shaft of humerus, unspecified arm, subsequent encounter for fracture with routine healing: Secondary | ICD-10-CM | POA: Diagnosis not present

## 2013-09-02 DIAGNOSIS — S42309D Unspecified fracture of shaft of humerus, unspecified arm, subsequent encounter for fracture with routine healing: Secondary | ICD-10-CM | POA: Diagnosis not present

## 2013-09-02 DIAGNOSIS — M549 Dorsalgia, unspecified: Secondary | ICD-10-CM | POA: Diagnosis not present

## 2013-09-02 DIAGNOSIS — I4891 Unspecified atrial fibrillation: Secondary | ICD-10-CM | POA: Diagnosis not present

## 2013-09-02 DIAGNOSIS — Z9181 History of falling: Secondary | ICD-10-CM | POA: Diagnosis not present

## 2013-09-02 DIAGNOSIS — Z7901 Long term (current) use of anticoagulants: Secondary | ICD-10-CM | POA: Diagnosis not present

## 2013-09-02 DIAGNOSIS — E119 Type 2 diabetes mellitus without complications: Secondary | ICD-10-CM | POA: Diagnosis not present

## 2013-09-03 DIAGNOSIS — I4891 Unspecified atrial fibrillation: Secondary | ICD-10-CM | POA: Diagnosis not present

## 2013-09-03 DIAGNOSIS — Z9181 History of falling: Secondary | ICD-10-CM | POA: Diagnosis not present

## 2013-09-03 DIAGNOSIS — Z7901 Long term (current) use of anticoagulants: Secondary | ICD-10-CM | POA: Diagnosis not present

## 2013-09-03 DIAGNOSIS — S42309D Unspecified fracture of shaft of humerus, unspecified arm, subsequent encounter for fracture with routine healing: Secondary | ICD-10-CM | POA: Diagnosis not present

## 2013-09-03 DIAGNOSIS — M549 Dorsalgia, unspecified: Secondary | ICD-10-CM | POA: Diagnosis not present

## 2013-09-03 DIAGNOSIS — E119 Type 2 diabetes mellitus without complications: Secondary | ICD-10-CM | POA: Diagnosis not present

## 2013-09-05 DIAGNOSIS — M545 Low back pain, unspecified: Secondary | ICD-10-CM | POA: Diagnosis not present

## 2013-09-05 DIAGNOSIS — D638 Anemia in other chronic diseases classified elsewhere: Secondary | ICD-10-CM | POA: Diagnosis not present

## 2013-09-05 DIAGNOSIS — E871 Hypo-osmolality and hyponatremia: Secondary | ICD-10-CM | POA: Diagnosis not present

## 2013-09-05 DIAGNOSIS — S5290XA Unspecified fracture of unspecified forearm, initial encounter for closed fracture: Secondary | ICD-10-CM | POA: Diagnosis not present

## 2013-09-06 DIAGNOSIS — G5600 Carpal tunnel syndrome, unspecified upper limb: Secondary | ICD-10-CM | POA: Diagnosis not present

## 2013-09-07 DIAGNOSIS — Z7901 Long term (current) use of anticoagulants: Secondary | ICD-10-CM | POA: Diagnosis not present

## 2013-09-07 DIAGNOSIS — S42309D Unspecified fracture of shaft of humerus, unspecified arm, subsequent encounter for fracture with routine healing: Secondary | ICD-10-CM | POA: Diagnosis not present

## 2013-09-07 DIAGNOSIS — E119 Type 2 diabetes mellitus without complications: Secondary | ICD-10-CM | POA: Diagnosis not present

## 2013-09-07 DIAGNOSIS — Z9181 History of falling: Secondary | ICD-10-CM | POA: Diagnosis not present

## 2013-09-07 DIAGNOSIS — M549 Dorsalgia, unspecified: Secondary | ICD-10-CM | POA: Diagnosis not present

## 2013-09-07 DIAGNOSIS — I4891 Unspecified atrial fibrillation: Secondary | ICD-10-CM | POA: Diagnosis not present

## 2013-09-09 DIAGNOSIS — E119 Type 2 diabetes mellitus without complications: Secondary | ICD-10-CM | POA: Diagnosis not present

## 2013-09-09 DIAGNOSIS — M549 Dorsalgia, unspecified: Secondary | ICD-10-CM | POA: Diagnosis not present

## 2013-09-09 DIAGNOSIS — I4891 Unspecified atrial fibrillation: Secondary | ICD-10-CM | POA: Diagnosis not present

## 2013-09-09 DIAGNOSIS — Z9181 History of falling: Secondary | ICD-10-CM | POA: Diagnosis not present

## 2013-09-09 DIAGNOSIS — Z7901 Long term (current) use of anticoagulants: Secondary | ICD-10-CM | POA: Diagnosis not present

## 2013-09-09 DIAGNOSIS — S42309D Unspecified fracture of shaft of humerus, unspecified arm, subsequent encounter for fracture with routine healing: Secondary | ICD-10-CM | POA: Diagnosis not present

## 2013-09-10 DIAGNOSIS — M549 Dorsalgia, unspecified: Secondary | ICD-10-CM | POA: Diagnosis not present

## 2013-09-10 DIAGNOSIS — Z9181 History of falling: Secondary | ICD-10-CM | POA: Diagnosis not present

## 2013-09-10 DIAGNOSIS — E119 Type 2 diabetes mellitus without complications: Secondary | ICD-10-CM | POA: Diagnosis not present

## 2013-09-10 DIAGNOSIS — I4891 Unspecified atrial fibrillation: Secondary | ICD-10-CM | POA: Diagnosis not present

## 2013-09-10 DIAGNOSIS — S42309D Unspecified fracture of shaft of humerus, unspecified arm, subsequent encounter for fracture with routine healing: Secondary | ICD-10-CM | POA: Diagnosis not present

## 2013-09-10 DIAGNOSIS — Z7901 Long term (current) use of anticoagulants: Secondary | ICD-10-CM | POA: Diagnosis not present

## 2013-09-13 DIAGNOSIS — Z7901 Long term (current) use of anticoagulants: Secondary | ICD-10-CM | POA: Diagnosis not present

## 2013-09-13 DIAGNOSIS — E119 Type 2 diabetes mellitus without complications: Secondary | ICD-10-CM | POA: Diagnosis not present

## 2013-09-13 DIAGNOSIS — Z9181 History of falling: Secondary | ICD-10-CM | POA: Diagnosis not present

## 2013-09-13 DIAGNOSIS — I4891 Unspecified atrial fibrillation: Secondary | ICD-10-CM | POA: Diagnosis not present

## 2013-09-13 DIAGNOSIS — M549 Dorsalgia, unspecified: Secondary | ICD-10-CM | POA: Diagnosis not present

## 2013-09-13 DIAGNOSIS — S42309D Unspecified fracture of shaft of humerus, unspecified arm, subsequent encounter for fracture with routine healing: Secondary | ICD-10-CM | POA: Diagnosis not present

## 2013-09-15 DIAGNOSIS — S42309D Unspecified fracture of shaft of humerus, unspecified arm, subsequent encounter for fracture with routine healing: Secondary | ICD-10-CM | POA: Diagnosis not present

## 2013-09-15 DIAGNOSIS — I4891 Unspecified atrial fibrillation: Secondary | ICD-10-CM | POA: Diagnosis not present

## 2013-09-15 DIAGNOSIS — Z7901 Long term (current) use of anticoagulants: Secondary | ICD-10-CM | POA: Diagnosis not present

## 2013-09-15 DIAGNOSIS — Z9181 History of falling: Secondary | ICD-10-CM | POA: Diagnosis not present

## 2013-09-15 DIAGNOSIS — E119 Type 2 diabetes mellitus without complications: Secondary | ICD-10-CM | POA: Diagnosis not present

## 2013-09-15 DIAGNOSIS — M549 Dorsalgia, unspecified: Secondary | ICD-10-CM | POA: Diagnosis not present

## 2013-09-20 ENCOUNTER — Ambulatory Visit
Admission: RE | Admit: 2013-09-20 | Discharge: 2013-09-20 | Disposition: A | Discharge status: Home / Self Care | Payer: Medicare Other | Source: Ambulatory Visit | Attending: Family Medicine | Admitting: Family Medicine

## 2013-09-20 ENCOUNTER — Other Ambulatory Visit: Payer: Self-pay | Admitting: Family Medicine

## 2013-09-20 DIAGNOSIS — S20212A Contusion of left front wall of thorax, initial encounter: Secondary | ICD-10-CM

## 2013-09-20 DIAGNOSIS — R072 Precordial pain: Secondary | ICD-10-CM | POA: Diagnosis not present

## 2013-09-20 DIAGNOSIS — S8000XA Contusion of unspecified knee, initial encounter: Secondary | ICD-10-CM | POA: Diagnosis not present

## 2013-09-20 DIAGNOSIS — M25562 Pain in left knee: Secondary | ICD-10-CM

## 2013-09-20 DIAGNOSIS — S20219A Contusion of unspecified front wall of thorax, initial encounter: Secondary | ICD-10-CM | POA: Diagnosis not present

## 2013-09-20 DIAGNOSIS — S99929A Unspecified injury of unspecified foot, initial encounter: Secondary | ICD-10-CM | POA: Diagnosis not present

## 2013-09-20 DIAGNOSIS — M25569 Pain in unspecified knee: Secondary | ICD-10-CM | POA: Diagnosis not present

## 2013-09-20 DIAGNOSIS — S99919A Unspecified injury of unspecified ankle, initial encounter: Secondary | ICD-10-CM | POA: Diagnosis not present

## 2013-09-20 DIAGNOSIS — S8990XA Unspecified injury of unspecified lower leg, initial encounter: Secondary | ICD-10-CM | POA: Diagnosis not present

## 2013-09-20 DIAGNOSIS — S298XXA Other specified injuries of thorax, initial encounter: Secondary | ICD-10-CM | POA: Diagnosis not present

## 2013-09-22 DIAGNOSIS — S42309D Unspecified fracture of shaft of humerus, unspecified arm, subsequent encounter for fracture with routine healing: Secondary | ICD-10-CM | POA: Diagnosis not present

## 2013-09-22 DIAGNOSIS — Z9181 History of falling: Secondary | ICD-10-CM | POA: Diagnosis not present

## 2013-09-22 DIAGNOSIS — E119 Type 2 diabetes mellitus without complications: Secondary | ICD-10-CM | POA: Diagnosis not present

## 2013-09-22 DIAGNOSIS — I4891 Unspecified atrial fibrillation: Secondary | ICD-10-CM | POA: Diagnosis not present

## 2013-09-22 DIAGNOSIS — Z7901 Long term (current) use of anticoagulants: Secondary | ICD-10-CM | POA: Diagnosis not present

## 2013-09-22 DIAGNOSIS — M549 Dorsalgia, unspecified: Secondary | ICD-10-CM | POA: Diagnosis not present

## 2013-09-23 DIAGNOSIS — S42309D Unspecified fracture of shaft of humerus, unspecified arm, subsequent encounter for fracture with routine healing: Secondary | ICD-10-CM | POA: Diagnosis not present

## 2013-09-23 DIAGNOSIS — M549 Dorsalgia, unspecified: Secondary | ICD-10-CM | POA: Diagnosis not present

## 2013-09-23 DIAGNOSIS — Z7901 Long term (current) use of anticoagulants: Secondary | ICD-10-CM | POA: Diagnosis not present

## 2013-09-23 DIAGNOSIS — I4891 Unspecified atrial fibrillation: Secondary | ICD-10-CM | POA: Diagnosis not present

## 2013-09-23 DIAGNOSIS — E119 Type 2 diabetes mellitus without complications: Secondary | ICD-10-CM | POA: Diagnosis not present

## 2013-09-23 DIAGNOSIS — Z9181 History of falling: Secondary | ICD-10-CM | POA: Diagnosis not present

## 2013-09-28 DIAGNOSIS — E119 Type 2 diabetes mellitus without complications: Secondary | ICD-10-CM | POA: Diagnosis not present

## 2013-09-28 DIAGNOSIS — Z9181 History of falling: Secondary | ICD-10-CM | POA: Diagnosis not present

## 2013-09-28 DIAGNOSIS — I4891 Unspecified atrial fibrillation: Secondary | ICD-10-CM | POA: Diagnosis not present

## 2013-09-28 DIAGNOSIS — M549 Dorsalgia, unspecified: Secondary | ICD-10-CM | POA: Diagnosis not present

## 2013-09-28 DIAGNOSIS — S42309D Unspecified fracture of shaft of humerus, unspecified arm, subsequent encounter for fracture with routine healing: Secondary | ICD-10-CM | POA: Diagnosis not present

## 2013-09-28 DIAGNOSIS — Z7901 Long term (current) use of anticoagulants: Secondary | ICD-10-CM | POA: Diagnosis not present

## 2013-09-29 DIAGNOSIS — S42309D Unspecified fracture of shaft of humerus, unspecified arm, subsequent encounter for fracture with routine healing: Secondary | ICD-10-CM | POA: Diagnosis not present

## 2013-09-29 DIAGNOSIS — M549 Dorsalgia, unspecified: Secondary | ICD-10-CM | POA: Diagnosis not present

## 2013-09-29 DIAGNOSIS — Z7901 Long term (current) use of anticoagulants: Secondary | ICD-10-CM | POA: Diagnosis not present

## 2013-09-29 DIAGNOSIS — I4891 Unspecified atrial fibrillation: Secondary | ICD-10-CM | POA: Diagnosis not present

## 2013-09-29 DIAGNOSIS — Z9181 History of falling: Secondary | ICD-10-CM | POA: Diagnosis not present

## 2013-09-29 DIAGNOSIS — E119 Type 2 diabetes mellitus without complications: Secondary | ICD-10-CM | POA: Diagnosis not present

## 2013-09-30 DIAGNOSIS — S42309D Unspecified fracture of shaft of humerus, unspecified arm, subsequent encounter for fracture with routine healing: Secondary | ICD-10-CM | POA: Diagnosis not present

## 2013-09-30 DIAGNOSIS — E119 Type 2 diabetes mellitus without complications: Secondary | ICD-10-CM | POA: Diagnosis not present

## 2013-09-30 DIAGNOSIS — I4891 Unspecified atrial fibrillation: Secondary | ICD-10-CM | POA: Diagnosis not present

## 2013-09-30 DIAGNOSIS — Z7901 Long term (current) use of anticoagulants: Secondary | ICD-10-CM | POA: Diagnosis not present

## 2013-09-30 DIAGNOSIS — M549 Dorsalgia, unspecified: Secondary | ICD-10-CM | POA: Diagnosis not present

## 2013-09-30 DIAGNOSIS — Z9181 History of falling: Secondary | ICD-10-CM | POA: Diagnosis not present

## 2013-10-04 ENCOUNTER — Ambulatory Visit (INDEPENDENT_AMBULATORY_CARE_PROVIDER_SITE_OTHER): Payer: Medicare Other | Admitting: Podiatry

## 2013-10-04 ENCOUNTER — Encounter: Payer: Self-pay | Admitting: Podiatry

## 2013-10-04 VITALS — BP 155/78 | HR 71 | Resp 18 | Ht 65.0 in | Wt 232.0 lb

## 2013-10-04 DIAGNOSIS — M79609 Pain in unspecified limb: Secondary | ICD-10-CM | POA: Diagnosis not present

## 2013-10-04 DIAGNOSIS — M79673 Pain in unspecified foot: Secondary | ICD-10-CM

## 2013-10-04 DIAGNOSIS — B351 Tinea unguium: Secondary | ICD-10-CM

## 2013-10-04 DIAGNOSIS — I4891 Unspecified atrial fibrillation: Secondary | ICD-10-CM | POA: Diagnosis not present

## 2013-10-04 DIAGNOSIS — S42309D Unspecified fracture of shaft of humerus, unspecified arm, subsequent encounter for fracture with routine healing: Secondary | ICD-10-CM | POA: Diagnosis not present

## 2013-10-04 DIAGNOSIS — M549 Dorsalgia, unspecified: Secondary | ICD-10-CM | POA: Diagnosis not present

## 2013-10-04 DIAGNOSIS — Q828 Other specified congenital malformations of skin: Secondary | ICD-10-CM | POA: Diagnosis not present

## 2013-10-04 DIAGNOSIS — Z9181 History of falling: Secondary | ICD-10-CM | POA: Diagnosis not present

## 2013-10-04 DIAGNOSIS — Z7901 Long term (current) use of anticoagulants: Secondary | ICD-10-CM | POA: Diagnosis not present

## 2013-10-04 DIAGNOSIS — E119 Type 2 diabetes mellitus without complications: Secondary | ICD-10-CM | POA: Diagnosis not present

## 2013-10-05 DIAGNOSIS — H2510 Age-related nuclear cataract, unspecified eye: Secondary | ICD-10-CM | POA: Diagnosis not present

## 2013-10-05 DIAGNOSIS — H2589 Other age-related cataract: Secondary | ICD-10-CM | POA: Diagnosis not present

## 2013-10-05 NOTE — Progress Notes (Signed)
Patient ID: [name], female   DOB: March 17, 1930, 80 y.o.   MRN: [id number]    Subjective: This patient presents for ongoing debridement of painful toenails and a nucleated keratoses  Objective: Hypertrophic, elongated, brittle, discolored toenails 6-10 Large nucleated keratoses plantar right first MPJ  Assessment: Symptomatic onychomycoses 6-10 Porokeratoses x1  Plan: Nails 6-10 and keratoses x1 debrided without a bleeding  Reappoint x3 months

## 2013-10-06 DIAGNOSIS — G5600 Carpal tunnel syndrome, unspecified upper limb: Secondary | ICD-10-CM | POA: Diagnosis not present

## 2013-10-08 DIAGNOSIS — E871 Hypo-osmolality and hyponatremia: Secondary | ICD-10-CM | POA: Diagnosis not present

## 2013-10-08 DIAGNOSIS — Z9181 History of falling: Secondary | ICD-10-CM | POA: Diagnosis not present

## 2013-10-08 DIAGNOSIS — I10 Essential (primary) hypertension: Secondary | ICD-10-CM | POA: Diagnosis not present

## 2013-10-08 DIAGNOSIS — D649 Anemia, unspecified: Secondary | ICD-10-CM | POA: Diagnosis not present

## 2013-10-08 DIAGNOSIS — R7309 Other abnormal glucose: Secondary | ICD-10-CM | POA: Diagnosis not present

## 2013-10-11 DIAGNOSIS — G5600 Carpal tunnel syndrome, unspecified upper limb: Secondary | ICD-10-CM | POA: Diagnosis not present

## 2013-10-11 DIAGNOSIS — M1710 Unilateral primary osteoarthritis, unspecified knee: Secondary | ICD-10-CM | POA: Diagnosis not present

## 2013-10-11 DIAGNOSIS — M25569 Pain in unspecified knee: Secondary | ICD-10-CM | POA: Diagnosis not present

## 2013-10-12 DIAGNOSIS — M25569 Pain in unspecified knee: Secondary | ICD-10-CM | POA: Diagnosis not present

## 2013-10-12 DIAGNOSIS — G5600 Carpal tunnel syndrome, unspecified upper limb: Secondary | ICD-10-CM | POA: Diagnosis not present

## 2013-10-12 DIAGNOSIS — M1710 Unilateral primary osteoarthritis, unspecified knee: Secondary | ICD-10-CM | POA: Diagnosis not present

## 2013-10-26 ENCOUNTER — Ambulatory Visit: Payer: Medicare Other | Admitting: Nurse Practitioner

## 2013-10-27 DIAGNOSIS — M810 Age-related osteoporosis without current pathological fracture: Secondary | ICD-10-CM | POA: Diagnosis not present

## 2013-11-08 DIAGNOSIS — M810 Age-related osteoporosis without current pathological fracture: Secondary | ICD-10-CM | POA: Diagnosis not present

## 2013-11-08 DIAGNOSIS — S9030XA Contusion of unspecified foot, initial encounter: Secondary | ICD-10-CM | POA: Diagnosis not present

## 2013-11-15 ENCOUNTER — Ambulatory Visit (INDEPENDENT_AMBULATORY_CARE_PROVIDER_SITE_OTHER): Payer: Medicare Other | Admitting: Neurology

## 2013-11-15 ENCOUNTER — Encounter: Payer: Self-pay | Admitting: Neurology

## 2013-11-15 VITALS — BP 140/72 | HR 79 | Ht 65.0 in | Wt 214.0 lb

## 2013-11-15 DIAGNOSIS — B0222 Postherpetic trigeminal neuralgia: Secondary | ICD-10-CM | POA: Diagnosis not present

## 2013-11-15 DIAGNOSIS — E871 Hypo-osmolality and hyponatremia: Secondary | ICD-10-CM

## 2013-11-15 DIAGNOSIS — R4189 Other symptoms and signs involving cognitive functions and awareness: Secondary | ICD-10-CM

## 2013-11-15 DIAGNOSIS — I6350 Cerebral infarction due to unspecified occlusion or stenosis of unspecified cerebral artery: Secondary | ICD-10-CM | POA: Diagnosis not present

## 2013-11-15 DIAGNOSIS — F09 Unspecified mental disorder due to known physiological condition: Secondary | ICD-10-CM

## 2013-11-15 NOTE — Patient Instructions (Addendum)
1.  We will continue the Carbatrol and Gabapentin at current dose for now. 2.  We will get MRI of the brain to look for a cause for the memory problems [facility]  12/01/13 at 10:45 3.  Follow up afterwards to discuss the next step. 4.  We will check vitamin B12 level. 5.  Send me the latest lab work you had at Dr. Orest Dikes office.

## 2013-11-15 NOTE — Progress Notes (Signed)
NEUROLOGY FOLLOW UP OFFICE NOTE  [name] [id number]  HISTORY OF PRESENT ILLNESS: [name] is an 80 year old right-handed woman with history of hypertension, CAD, atrial fibrillation, stroke with right-sided weakness, hypercholesterolemia, diabetes mellitus, DVT and pulmonary embolism (on Lovenox), migraine, herpes zoster, sleep apnea, gait disorder, memory problems and anxiety who follows up for memory problems and right post-herpetic trigeminal neuralgia.  She is accompanied by her daughter.   I  ATYPICAL RIGHT FACIAL PAIN: Update: Currently controlled.  Recent blood work reportedly shows Na remains in the lower limit of normal.  The results are not available to me. Current medication:  Carbatrol 336m/200mg/300mg, Gabapentin 3049mat bedtime.    History: Onset of symptoms started in 2006, following episode of herpes zoster involving she describes sharp pain involving the the right V1 and V2 distribution, associated with allodynia. Chewing, talking, and brushing her teeth triggers and exacerbates the pain.  She was subsequently treated for postherpetic neuralgia.  Hasn't had an attack since August 2014.  Past medications:   carbamazepine generic which led to breakthrough pain.    09/23/08 MRI BRAIN WO: Small vessel disease, remote infarcts left caudate and left cerebellum 09/23/08 MRA/MRV HEAD:  Unremarkable.  07/23/13 CT Head:  Small vessel ischemic changes with old strokes in the left cerebellar hemisphere and body of caudate, stable from prior studies.  LABS: 03/06/13 TSH 1.012 03/07/13 LDL 75 04/22/13 carbamazepine level 7 08/03/13 TP 7, ALB 3.7, Alk Phos 107, AST 16, ALT 10, TB 0.2 08/05/13 Hgb A1c 5.9 08/06/13 WBC 6.7, HGB 10, HCT 29.3, PLT 191, MCV 82.3 08/07/13:  Na 131, K 4.4, Cl 96, bicarb 22, BUN 23, Cr 1.21, Ca 9   II.  COGNITIVE DEFICITS: Update: As symptoms began after initiation of gabapentin, and became worse after starting Vicodin, she was asked to return  for re-evaluation after tapering off these medications.  She only takes the Vicodin occasionally now.  There has been no improvement in memory.    History: This started about a year ago after initiation of gabapentin. It was fairly sudden onset and has not progressed.  She tends to repeat herself often, such as telling stories. She repeats questions often. She reports word-finding difficulties as well.  She has no difficulty typically with performing ADLs. She typically lives by herself in her one story house. She manages her own finances. She doesn't drive 2 to remote history of blackout spells. She typically does not have problems recognizing people or recalling names. She denies any depression. She denies any hallucinations. She does have history of vivid nightmares. Earlier this month, she fell bruising her hip and fracturing her arm. In June 2015, she was started on Vicodin and had since noted more cognitive clouding. She reports a maternal aunt with history of dementia.  Of note, she has history of SIADH, in which she has had excessive urination. Earlier this month, she was found to be hyponatremic, thought to be due to dehydration. Her sodium was at 124. This was corrected. Most recent labs from 08/07/13 revealed sodium of 131 and creatinine of 1.21.  She also reports previous episodes of bi-temporal head pain associated with feeling of lightheadedness.  It is not associated with spinning sensation.  It lasted about 2 weeks and resolved about a week ago.  It was different than her usual vertigo.  PAST MEDICAL HISTORY: Past Medical History  Diagnosis Date  . Shingles     zoster 1990 on the back and on the abdomen in  2007,facial zoster 2006  . Hypertension   . A-fib   . Diabetes mellitus without complication   . Glaucoma   . Pulmonary embolism recurrent     IVC filter  . Acid reflux   . Arthritis   . Migraine   . Postherpetic trigeminal neuralgia 10/20/2012  . Unspecified cerebral artery  occlusion with cerebral infarction   . Memory loss   . Chronic anticoagulation 03/07/2013    Lovenox 100 QD  . Junctional bradycardia     resolved with discontinuation of sotalol (2014) after 10 yrs  . DVT (deep venous thrombosis) 2007    recurrent w PE s/p [place] filter  . Hypoglycemia   . GERD (gastroesophageal reflux disease)     w hiatal hernia endoscopy 2003, Dr Penelope Coop  . Diverticulosis   . Phlebitis   . Osteoarthritis     in Bilateral knees and back. -Dr Latanya Maudlin  . Panic attacks   . Anxiety   . IBS (irritable bowel syndrome)   . Hypothyroidism     goiter  . OSA (obstructive sleep apnea) Dx: 2008    could never use CPAP repeat study showed no sleep apnea (HST 03-03-13 ESS 8, AHI 2/hr)  . Vitamin D deficiency   . CVA (cerebral infarction) 1964  . Osteoporosis   . Trigeminal neuralgia     r face--Dr Dohmeier  . Hypercholesterolemia   . Chronic kidney disease     Stage III  . Dementia     Dr Brett Fairy    MEDICATIONS: Current Outpatient Prescriptions on File Prior to Visit  Medication Sig Dispense Refill  . amLODipine (NORVASC) 5 MG tablet Take 1 tablet (5 mg total) by mouth daily.  30 tablet  11  . carbamazepine (CARBATROL) 100 MG 12 hr capsule Take 200-300 mg by mouth 3 (three) times daily. Takes 327m in am, 2045mat noon, 30051mt bedtime (all after meals)      . CARBATROL 100 MG 12 hr capsule TAKE 3 caps BY MOUTH EVERY MORNING, 2 CAPS AT NOON, AND 3 caps AT BEDTIME. TAKE AFTER MEALS.  240 capsule  3  . enoxaparin (LOVENOX) 100 MG/ML injection Inject 100 mg into the skin at bedtime.       . EMarland KitchenINEPHrine (EPIPEN IJ) Inject as directed once as needed. For allergic reactions      . Fe Fum-FA-B Cmp-C-Zn-Mg-Mn-Cu (HEMOCYTE PLUS) 106-1 MG CAPS Take 1 capsule by mouth daily.       . folic acid (FOLVITE) 1 MG tablet Take 1 mg by mouth every morning.       . Gabapentin, PHN, 300 MG TABS Take 300 mg by mouth at bedtime.      . HMarland KitchenDROcodone-acetaminophen (NORCO/VICODIN) 5-325  MG per tablet Take 1 tablet by mouth every 6 (six) hours as needed for moderate pain.      . lMarland Kitchenvothyroxine (SYNTHROID, LEVOTHROID) 75 MCG tablet Take 75 mcg by mouth daily before breakfast.       . loratadine (CLARITIN) 10 MG tablet Take 10 mg by mouth daily.      . meclizine (ANTIVERT) 12.5 MG tablet Take 12.5 mg by mouth 3 (three) times daily as needed. For dizziness      . Menthol (EUCERIN SKIN CALMING) 0.1 % LOTN Apply 1 application topically daily as needed. For dry skin      . metaxalone (SKELAXIN) 800 MG tablet Take 0.5 tablets (400 mg total) by mouth 3 (three) times daily.  90 tablet  0  . Multiple Vitamin (MULTIVITAMIN)  tablet Take 1 tablet by mouth every morning.       . pravastatin (PRAVACHOL) 40 MG tablet Take 40 mg by mouth every morning.       . RABEprazole (ACIPHEX) 20 MG tablet Take 20 mg by mouth every morning.       . sodium chloride 1 G tablet Take 1 tablet (1 g total) by mouth 2 (two) times daily with a meal.  60 tablet  0  . Travoprost, BAK Free, (TRAVATAN) 0.004 % SOLN ophthalmic solution Place 1 drop into both eyes at bedtime.      . Vitamin D, Ergocalciferol, (DRISDOL) 50000 UNITS CAPS capsule Take 50,000 Units by mouth every Saturday.       Marland Kitchen acetaminophen-codeine (TYLENOL #3) 300-30 MG per tablet Take 1 tablet by mouth every 6 (six) hours as needed for moderate pain.  30 tablet  0   No current facility-administered medications on file prior to visit.    ALLERGIES: Allergies  Allergen Reactions  . Benadryl [Diphenhydramine Hcl] Hives and Shortness Of Breath  . Eye Drops [Tetrahydrazoline Hcl] Other (See Comments)    Burning, itching, swelling  . Flu Virus Vaccine Shortness Of Breath  . Iohexol Hives and Shortness Of Breath     Code: HIVES, Desc: PT IS ALLERGIC TO IVP DYE/IODINE/SHRIMP.  CAUSES SOB AND HIVES PER PT.  STEPHANIE, RT-R, Onset Date: [id number]   . Shrimp [Shellfish Allergy] Anaphylaxis  . Voltaren [Diclofenac Sodium] Other (See Comments)    Acid reflux  symptoms  . Ambien [Zolpidem Tartrate] Hives  . Gluten Meal Diarrhea  . Travatan Z [Travoprost] Other (See Comments)    BRAND NAME ONLY OLCANNOT TOLERATE GENERIC FORM OF EYE DROPS, IT CONTAINS PRESERVATIVES-SENSITIVITY  . Ativan [Lorazepam]     delusional  . Hydrocodone     delusions  . Betadine Antibiotic-Moisturize [Bacitracin-Polymyxin B] Rash  . Biaxin [Clarithromycin] Hives  . Celebrex [Celecoxib] Rash  . Ciprofloxacin Hcl Rash       . Combigan [Brimonidine Tartrate-Timolol] Other (See Comments)    unknown  . Coumadin [Warfarin Sodium] Rash  . Ivp Dye [Iodinated Diagnostic Agents] Hives  . Levaquin [Levofloxacin In D5w] Rash  . Naprosyn [Naproxen] Rash  . Pataday [Olopatadine Hcl] Other (See Comments)  . Penicillins Hives  . Plavix [Clopidogrel Bisulfate] Rash  . Septra [Sulfamethoxazole-Tmp Ds] Hives    FAMILY HISTORY: Family History  Problem Relation Age of Onset  . Diabetes Mother   . Hypertension Mother   . Glaucoma Mother   . CVA Mother   . CAD Mother   . Hypertension Father   . Heart attack Father   . Kidney failure Father   . Heart attack Brother     2 half brothers  . Hypertension Brother   . CVA Brother   . Glaucoma Other   . Glaucoma Other   . Breast cancer Maternal Aunt   . Colon cancer Maternal Uncle   . Rectal cancer Maternal Uncle   . Prostate cancer Maternal Grandfather     SOCIAL HISTORY: History   Social History  . Marital Status: Divorced    Spouse Name: N/A    Number of Children: 3  . Years of Education: 14   Occupational History  . retired     Armed forces training and education officer   Social History Main Topics  . Smoking status: Never Smoker   . Smokeless tobacco: Never Used  . Alcohol Use: Yes     Comment: very moderate, none  in the last two  months  . Drug Use: No  . Sexual Activity: Not on file   Other Topics Concern  . Not on file   Social History Narrative   Patient is retired and lives at home alone. Patient has a college  education.    Patient has three children.   Patient is right-handed.   Patient drinks 1-2 cups of caffeine daily.    REVIEW OF SYSTEMS: Constitutional: No fevers, chills, or sweats, no generalized fatigue, change in appetite Eyes: No visual changes, double vision, eye pain Ear, nose and throat: No hearing loss, ear pain, nasal congestion, sore throat Cardiovascular: No chest pain, palpitations Respiratory:  No shortness of breath at rest or with exertion, wheezes GastrointestinaI: No nausea, vomiting, diarrhea, abdominal pain, fecal incontinence Genitourinary:  No dysuria, urinary retention or frequency Musculoskeletal:  No neck pain, back pain Integumentary: No rash, pruritus, skin lesions Neurological: as above Psychiatric: No depression, insomnia, anxiety Endocrine: No palpitations, fatigue, diaphoresis, mood swings, change in appetite, change in weight, increased thirst Hematologic/Lymphatic:  No anemia, purpura, petechiae. Allergic/Immunologic: no itchy/runny eyes, nasal congestion, recent allergic reactions, rashes  PHYSICAL EXAM: Filed Vitals:   11/15/13 1232  BP: 140/72  Pulse: 79   General: No acute distress Head:  Normocephalic/atraumatic Neck: supple, no paraspinal tenderness, full range of motion Heart:  Regular rate and rhythm Lungs:  Clear to auscultation bilaterally Back: No paraspinal tenderness Neurological Exam: alert and oriented to person, place, and time (except date). Attention span and concentration primarily intact, remote memory intact, fund of knowledge intact.   Montreal Cognitive Assessment  11/15/2013  Visuospatial/ Executive (0/5) 2  Naming (0/3) 2  Attention: Read list of digits (0/2) 2  Attention: Read list of letters (0/1) 1  Attention: Serial 7 subtraction starting at 100 (0/3) 2  Language: Repeat phrase (0/2) 2  Language : Fluency (0/1) 0  Abstraction (0/2) 1  Delayed Recall (0/5) 0  Orientation (0/6) 5  Total 17  Adjusted Score (based  on education) 18   Speech fluent and not dysarthric, language intact.   CN II-XII intact. Fundi not visualized.   Bulk and tone normal, muscle strength 5/5 throughout.   Sensation to temperature and vibration intact.   Deep tendon reflexes 1+ in upper extremities and absent in lower extremities, toes downgoing.   Finger to nose intact.   Requires use of walker.  IMPRESSION: 1.Right postherpetic trigeminal neuralgia, stable 2.  Dementia.  Multi-domain but primarily amnestic.  This, in addition to the sudden onset of symptoms and lack of progression, may suggest vascular dementia. 3.  Hyponatremia. Has history of SIADH. Carbatrol may also cause hyponatremia, but with history of SIADH it is hard to tell.  Sodium has been Reportedly sodium is stable, but would discontinue Carbatrol if it drops further.  PLAN: 1.  Will get MRI of the brain to evaluate for etiology of cognitive impairment 2.  Continue Carbatrol 300/200/300 and gabapentin 365m at bedtime for now.   3.  Follow up after MRI to discuss next step.  Consider initiating Aricept or possibly neuropsych testing if imaging not really revealing. 4.  Check vitamin B12 5.  Asked patient to send most recent lab results (Na).  30 minutes spent with patient and daughter, over 50% spent discussing etiology and coordinating care.  AMetta Clines DO  CC:  CHarlan Stains MD

## 2013-11-16 LAB — VITAMIN B12: VITAMIN B 12: 703 pg/mL (ref 211–911)

## 2013-11-17 DIAGNOSIS — M1710 Unilateral primary osteoarthritis, unspecified knee: Secondary | ICD-10-CM | POA: Diagnosis not present

## 2013-11-24 DIAGNOSIS — M1710 Unilateral primary osteoarthritis, unspecified knee: Secondary | ICD-10-CM | POA: Diagnosis not present

## 2013-12-01 ENCOUNTER — Ambulatory Visit (HOSPITAL_COMMUNITY)
Admission: RE | Admit: 2013-12-01 | Discharge: 2013-12-01 | Disposition: A | Discharge status: Home / Self Care | Payer: Medicare Other | Source: Ambulatory Visit | Attending: Diagnostic Radiology | Admitting: Diagnostic Radiology

## 2013-12-01 DIAGNOSIS — R413 Other amnesia: Secondary | ICD-10-CM | POA: Diagnosis not present

## 2013-12-01 DIAGNOSIS — R4189 Other symptoms and signs involving cognitive functions and awareness: Secondary | ICD-10-CM

## 2013-12-01 DIAGNOSIS — B0222 Postherpetic trigeminal neuralgia: Secondary | ICD-10-CM

## 2013-12-01 DIAGNOSIS — I6789 Other cerebrovascular disease: Secondary | ICD-10-CM | POA: Diagnosis not present

## 2013-12-01 DIAGNOSIS — F09 Unspecified mental disorder due to known physiological condition: Secondary | ICD-10-CM | POA: Diagnosis present

## 2013-12-01 DIAGNOSIS — M1710 Unilateral primary osteoarthritis, unspecified knee: Secondary | ICD-10-CM | POA: Diagnosis not present

## 2013-12-03 ENCOUNTER — Ambulatory Visit (INDEPENDENT_AMBULATORY_CARE_PROVIDER_SITE_OTHER): Payer: Medicare Other | Admitting: Neurology

## 2013-12-03 ENCOUNTER — Encounter (HOSPITAL_COMMUNITY): Payer: Self-pay | Admitting: Emergency Medicine

## 2013-12-03 ENCOUNTER — Encounter: Payer: Self-pay | Admitting: Neurology

## 2013-12-03 ENCOUNTER — Observation Stay (HOSPITAL_COMMUNITY)
Admission: EM | Admit: 2013-12-03 | Discharge: 2013-12-05 | Disposition: A | Discharge status: Home / Self Care | Payer: Medicare Other | Attending: Internal Medicine | Admitting: Internal Medicine

## 2013-12-03 ENCOUNTER — Observation Stay (HOSPITAL_COMMUNITY): Payer: Medicare Other

## 2013-12-03 ENCOUNTER — Ambulatory Visit (HOSPITAL_COMMUNITY): Payer: Medicare Other

## 2013-12-03 VITALS — BP 112/70 | HR 84 | Resp 16 | Wt 213.6 lb

## 2013-12-03 DIAGNOSIS — B0222 Postherpetic trigeminal neuralgia: Secondary | ICD-10-CM | POA: Diagnosis not present

## 2013-12-03 DIAGNOSIS — Z86711 Personal history of pulmonary embolism: Secondary | ICD-10-CM

## 2013-12-03 DIAGNOSIS — I679 Cerebrovascular disease, unspecified: Secondary | ICD-10-CM | POA: Diagnosis not present

## 2013-12-03 DIAGNOSIS — R42 Dizziness and giddiness: Secondary | ICD-10-CM

## 2013-12-03 DIAGNOSIS — Z887 Allergy status to serum and vaccine status: Secondary | ICD-10-CM | POA: Insufficient documentation

## 2013-12-03 DIAGNOSIS — Q828 Other specified congenital malformations of skin: Secondary | ICD-10-CM

## 2013-12-03 DIAGNOSIS — Z7901 Long term (current) use of anticoagulants: Secondary | ICD-10-CM

## 2013-12-03 DIAGNOSIS — G500 Trigeminal neuralgia: Secondary | ICD-10-CM | POA: Insufficient documentation

## 2013-12-03 DIAGNOSIS — D539 Nutritional anemia, unspecified: Secondary | ICD-10-CM

## 2013-12-03 DIAGNOSIS — R4789 Other speech disturbances: Principal | ICD-10-CM | POA: Insufficient documentation

## 2013-12-03 DIAGNOSIS — Z8673 Personal history of transient ischemic attack (TIA), and cerebral infarction without residual deficits: Secondary | ICD-10-CM | POA: Diagnosis not present

## 2013-12-03 DIAGNOSIS — G458 Other transient cerebral ischemic attacks and related syndromes: Secondary | ICD-10-CM

## 2013-12-03 DIAGNOSIS — E7800 Pure hypercholesterolemia, unspecified: Secondary | ICD-10-CM | POA: Insufficient documentation

## 2013-12-03 DIAGNOSIS — Z91041 Radiographic dye allergy status: Secondary | ICD-10-CM | POA: Diagnosis not present

## 2013-12-03 DIAGNOSIS — I4891 Unspecified atrial fibrillation: Secondary | ICD-10-CM | POA: Diagnosis not present

## 2013-12-03 DIAGNOSIS — Z880 Allergy status to penicillin: Secondary | ICD-10-CM | POA: Insufficient documentation

## 2013-12-03 DIAGNOSIS — I480 Paroxysmal atrial fibrillation: Secondary | ICD-10-CM

## 2013-12-03 DIAGNOSIS — E119 Type 2 diabetes mellitus without complications: Secondary | ICD-10-CM | POA: Diagnosis not present

## 2013-12-03 DIAGNOSIS — E871 Hypo-osmolality and hyponatremia: Secondary | ICD-10-CM | POA: Diagnosis not present

## 2013-12-03 DIAGNOSIS — F411 Generalized anxiety disorder: Secondary | ICD-10-CM | POA: Diagnosis not present

## 2013-12-03 DIAGNOSIS — E669 Obesity, unspecified: Secondary | ICD-10-CM

## 2013-12-03 DIAGNOSIS — E875 Hyperkalemia: Secondary | ICD-10-CM

## 2013-12-03 DIAGNOSIS — I6350 Cerebral infarction due to unspecified occlusion or stenosis of unspecified cerebral artery: Secondary | ICD-10-CM

## 2013-12-03 DIAGNOSIS — N390 Urinary tract infection, site not specified: Secondary | ICD-10-CM

## 2013-12-03 DIAGNOSIS — D649 Anemia, unspecified: Secondary | ICD-10-CM | POA: Insufficient documentation

## 2013-12-03 DIAGNOSIS — R4701 Aphasia: Secondary | ICD-10-CM | POA: Diagnosis not present

## 2013-12-03 DIAGNOSIS — E860 Dehydration: Secondary | ICD-10-CM

## 2013-12-03 DIAGNOSIS — Z91013 Allergy to seafood: Secondary | ICD-10-CM | POA: Diagnosis not present

## 2013-12-03 DIAGNOSIS — Z888 Allergy status to other drugs, medicaments and biological substances status: Secondary | ICD-10-CM | POA: Diagnosis not present

## 2013-12-03 DIAGNOSIS — I2510 Atherosclerotic heart disease of native coronary artery without angina pectoris: Secondary | ICD-10-CM | POA: Insufficient documentation

## 2013-12-03 DIAGNOSIS — R001 Bradycardia, unspecified: Secondary | ICD-10-CM

## 2013-12-03 DIAGNOSIS — B351 Tinea unguium: Secondary | ICD-10-CM

## 2013-12-03 DIAGNOSIS — R110 Nausea: Secondary | ICD-10-CM

## 2013-12-03 DIAGNOSIS — I10 Essential (primary) hypertension: Secondary | ICD-10-CM | POA: Diagnosis not present

## 2013-12-03 DIAGNOSIS — E039 Hypothyroidism, unspecified: Secondary | ICD-10-CM | POA: Diagnosis not present

## 2013-12-03 DIAGNOSIS — Z885 Allergy status to narcotic agent status: Secondary | ICD-10-CM | POA: Insufficient documentation

## 2013-12-03 DIAGNOSIS — G459 Transient cerebral ischemic attack, unspecified: Secondary | ICD-10-CM | POA: Diagnosis present

## 2013-12-03 DIAGNOSIS — E222 Syndrome of inappropriate secretion of antidiuretic hormone: Secondary | ICD-10-CM

## 2013-12-03 LAB — URINALYSIS, ROUTINE W REFLEX MICROSCOPIC
Bilirubin Urine: NEGATIVE
Glucose, UA: NEGATIVE mg/dL
Hgb urine dipstick: NEGATIVE
Ketones, ur: NEGATIVE mg/dL
Nitrite: POSITIVE — AB
Protein, ur: NEGATIVE mg/dL
Specific Gravity, Urine: 1.008 (ref 1.005–1.030)
Urobilinogen, UA: 0.2 mg/dL (ref 0.0–1.0)
pH: 7.5 (ref 5.0–8.0)

## 2013-12-03 LAB — CBC WITH DIFFERENTIAL/PLATELET
Basophils Absolute: 0.1 10*3/uL (ref 0.0–0.1)
Basophils Relative: 1 % (ref 0–1)
Eosinophils Absolute: 0.2 10*3/uL (ref 0.0–0.7)
Eosinophils Relative: 4 % (ref 0–5)
HCT: 29.6 % — ABNORMAL LOW (ref 36.0–46.0)
Hemoglobin: 10 g/dL — ABNORMAL LOW (ref 12.0–15.0)
Lymphocytes Relative: 19 % (ref 12–46)
Lymphs Abs: 1 10*3/uL (ref 0.7–4.0)
MCH: 27.9 pg (ref 26.0–34.0)
MCHC: 33.8 g/dL (ref 30.0–36.0)
MCV: 82.5 fL (ref 78.0–100.0)
Monocytes Absolute: 0.8 10*3/uL (ref 0.1–1.0)
Monocytes Relative: 15 % — ABNORMAL HIGH (ref 3–12)
Neutro Abs: 3.1 10*3/uL (ref 1.7–7.7)
Neutrophils Relative %: 61 % (ref 43–77)
Platelets: 163 10*3/uL (ref 150–400)
RBC: 3.59 MIL/uL — ABNORMAL LOW (ref 3.87–5.11)
RDW: 16.8 % — ABNORMAL HIGH (ref 11.5–15.5)
WBC: 5.1 10*3/uL (ref 4.0–10.5)

## 2013-12-03 LAB — BASIC METABOLIC PANEL
Anion gap: 13 (ref 5–15)
BUN: 19 mg/dL (ref 6–23)
CO2: 23 mEq/L (ref 19–32)
Calcium: 9 mg/dL (ref 8.4–10.5)
Chloride: 98 mEq/L (ref 96–112)
Creatinine, Ser: 1.19 mg/dL — ABNORMAL HIGH (ref 0.50–1.10)
GFR calc Af Amer: 48 mL/min — ABNORMAL LOW (ref >90)
GFR calc non Af Amer: 41 mL/min — ABNORMAL LOW (ref >90)
Glucose, Bld: 86 mg/dL (ref 70–99)
Potassium: 5.1 mEq/L (ref 3.7–5.3)
Sodium: 134 mEq/L — ABNORMAL LOW (ref 137–147)

## 2013-12-03 LAB — URINE MICROSCOPIC-ADD ON

## 2013-12-03 LAB — RAPID URINE DRUG SCREEN, HOSP PERFORMED
Amphetamines: NOT DETECTED
BENZODIAZEPINES: NOT DETECTED
Barbiturates: NOT DETECTED
COCAINE: NOT DETECTED
OPIATES: NOT DETECTED
Tetrahydrocannabinol: NOT DETECTED

## 2013-12-03 MED ORDER — CARBAMAZEPINE ER 200 MG PO TB12
300.0000 mg | ORAL_TABLET | Freq: Two times a day (BID) | ORAL | Status: DC
  Administered 2013-12-03 – 2013-12-05 (×4): 300 mg via ORAL
  Filled 2013-12-03 (×6): qty 1

## 2013-12-03 MED ORDER — FOLIC ACID 1 MG PO TABS
1.0000 mg | ORAL_TABLET | Freq: Every morning | ORAL | Status: DC
  Administered 2013-12-04 – 2013-12-05 (×2): 1 mg via ORAL
  Filled 2013-12-03 (×2): qty 1

## 2013-12-03 MED ORDER — AMLODIPINE BESYLATE 5 MG PO TABS
5.0000 mg | ORAL_TABLET | Freq: Every day | ORAL | Status: DC
  Administered 2013-12-04 – 2013-12-05 (×2): 5 mg via ORAL
  Filled 2013-12-03 (×2): qty 1

## 2013-12-03 MED ORDER — ONE-DAILY MULTI VITAMINS PO TABS
1.0000 | ORAL_TABLET | Freq: Every morning | ORAL | Status: DC
  Administered 2013-12-04 – 2013-12-05 (×2): 1 via ORAL
  Filled 2013-12-03 (×2): qty 1

## 2013-12-03 MED ORDER — STROKE: EARLY STAGES OF RECOVERY BOOK
Freq: Once | Status: DC
  Filled 2013-12-03: qty 1

## 2013-12-03 MED ORDER — HYDROCODONE-ACETAMINOPHEN 5-325 MG PO TABS
1.0000 | ORAL_TABLET | Freq: Four times a day (QID) | ORAL | Status: DC | PRN
  Administered 2013-12-03 – 2013-12-05 (×4): 1 via ORAL
  Filled 2013-12-03 (×4): qty 1

## 2013-12-03 MED ORDER — GABAPENTIN 300 MG PO CAPS
300.0000 mg | ORAL_CAPSULE | Freq: Every day | ORAL | Status: DC
  Administered 2013-12-03 – 2013-12-04 (×2): 300 mg via ORAL
  Filled 2013-12-03 (×2): qty 1

## 2013-12-03 MED ORDER — LORATADINE 10 MG PO TABS
10.0000 mg | ORAL_TABLET | Freq: Every day | ORAL | Status: DC
  Administered 2013-12-03 – 2013-12-05 (×3): 10 mg via ORAL
  Filled 2013-12-03 (×3): qty 1

## 2013-12-03 MED ORDER — SIMVASTATIN 20 MG PO TABS
20.0000 mg | ORAL_TABLET | Freq: Every day | ORAL | Status: DC
  Administered 2013-12-04: 20 mg via ORAL
  Filled 2013-12-03: qty 1

## 2013-12-03 MED ORDER — CARBAMAZEPINE ER 100 MG PO CP12: 200.0000 mg | ORAL_CAPSULE | Freq: Three times a day (TID) | ORAL | Status: DC

## 2013-12-03 MED ORDER — CARBAMAZEPINE ER 200 MG PO TB12
200.0000 mg | ORAL_TABLET | Freq: Every day | ORAL | Status: DC
  Administered 2013-12-04 – 2013-12-05 (×2): 200 mg via ORAL
  Filled 2013-12-03 (×2): qty 1

## 2013-12-03 MED ORDER — PANTOPRAZOLE SODIUM 40 MG PO TBEC
40.0000 mg | DELAYED_RELEASE_TABLET | Freq: Every day | ORAL | Status: DC
  Administered 2013-12-04 – 2013-12-05 (×2): 40 mg via ORAL
  Filled 2013-12-03 (×2): qty 1

## 2013-12-03 MED ORDER — HEMOCYTE PLUS 106-1 MG PO CAPS
1.0000 | ORAL_CAPSULE | Freq: Every day | ORAL | Status: DC
  Filled 2013-12-03 (×2): qty 1

## 2013-12-03 MED ORDER — ENOXAPARIN SODIUM 100 MG/ML ~~LOC~~ SOLN
100.0000 mg | Freq: Every day | SUBCUTANEOUS | Status: DC
  Administered 2013-12-03 – 2013-12-04 (×2): 100 mg via SUBCUTANEOUS
  Filled 2013-12-03 (×2): qty 1

## 2013-12-03 MED ORDER — SODIUM CHLORIDE 1 G PO TABS
1.0000 g | ORAL_TABLET | Freq: Two times a day (BID) | ORAL | Status: DC
  Administered 2013-12-03 – 2013-12-05 (×4): 1 g via ORAL
  Filled 2013-12-03 (×6): qty 1

## 2013-12-03 MED ORDER — GABAPENTIN (ONCE-DAILY) 300 MG PO TABS: 300.0000 mg | ORAL_TABLET | Freq: Every day | ORAL | Status: DC

## 2013-12-03 MED ORDER — FERROUS FUMARATE 325 (106 FE) MG PO TABS
1.0000 | ORAL_TABLET | Freq: Every day | ORAL | Status: DC
  Administered 2013-12-03 – 2013-12-05 (×3): 106 mg via ORAL
  Filled 2013-12-03 (×4): qty 1

## 2013-12-03 MED ORDER — DEXTROSE 5 % IV SOLN
1.0000 g | Freq: Once | INTRAVENOUS | Status: AC
  Administered 2013-12-03: 1 g via INTRAVENOUS
  Filled 2013-12-03: qty 10

## 2013-12-03 MED ORDER — VITAMIN D (ERGOCALCIFEROL) 1.25 MG (50000 UNIT) PO CAPS
50000.0000 [IU] | ORAL_CAPSULE | ORAL | Status: DC
  Administered 2013-12-04: 50000 [IU] via ORAL
  Filled 2013-12-03: qty 1

## 2013-12-03 MED ORDER — CARBAMAZEPINE ER 200 MG PO CP12: 200.0000 mg | ORAL_CAPSULE | Freq: Every day | ORAL | Status: DC

## 2013-12-03 MED ORDER — DEXTROSE 5 % IV SOLN
1.0000 g | INTRAVENOUS | Status: DC
  Filled 2013-12-03: qty 10

## 2013-12-03 MED ORDER — CARBAMAZEPINE ER 300 MG PO CP12
300.0000 mg | ORAL_CAPSULE | Freq: Two times a day (BID) | ORAL | Status: DC
  Filled 2013-12-03 (×2): qty 1

## 2013-12-03 MED ORDER — LEVOTHYROXINE SODIUM 50 MCG PO TABS
75.0000 ug | ORAL_TABLET | Freq: Every day | ORAL | Status: DC
  Administered 2013-12-04 – 2013-12-05 (×2): 75 ug via ORAL
  Filled 2013-12-03 (×4): qty 1

## 2013-12-03 NOTE — Progress Notes (Signed)
Patient arrived to room 4n23 via stretcher. VSS, NIH completed. Oriented to room / call bell. Family in room updated on plan. Q2 vitals/neuro checks until 6am.

## 2013-12-03 NOTE — ED Notes (Signed)
Patient returned from CT

## 2013-12-03 NOTE — H&P (Signed)
[facility] History and Physical  [name] SNK:[id number] DOB: [birth year] DOA: 12/03/2013  Referring physician: er PCP: Vidal Schwalbe, MD   Chief Complaint: difficulty speaking  HPI: [name] is a 80 y.o. female who was sent to the ER from her neurologist office.   Patient woke up yesterday and noted difficulty talking. She has difficulty getting words out. No issues when she sang.  She also noted left sided headache, which has  Resolved An MRI of the brain was performed on 12/01/13 to evaluate for cognitive difficulties, which revealed extensive small vessel ischemic changes throughout the deep and subcortical white matter, including remote small infarcts involving the cerebellum and left basal ganglia. No acute findings noted.   She says she has chronic UTIs for which she take macrodantin PRN.  She is not currently having burning or pain with urination.   No fever, no chills   In the ER, a CT scan was done with no acute findings.  Labs were remarkable for UTI but no WBC count  Review of Systems:  All systems reviewed, negative unless stated above    Past Medical History  Diagnosis Date  . Shingles     zoster 1990 on the back and on the abdomen in 2007,facial zoster 2006  . Hypertension   . A-fib   . Diabetes mellitus without complication   . Glaucoma   . Pulmonary embolism recurrent     IVC filter  . Acid reflux   . Arthritis   . Migraine   . Postherpetic trigeminal neuralgia 10/20/2012  . Unspecified cerebral artery occlusion with cerebral infarction   . Memory loss   . Chronic anticoagulation 03/07/2013    Lovenox 100 QD  . Junctional bradycardia     resolved with discontinuation of sotalol (2014) after 10 yrs  . DVT (deep venous thrombosis) 2007    recurrent w PE s/p [place] filter  . Hypoglycemia   . GERD (gastroesophageal reflux disease)     w hiatal hernia endoscopy 2003, Dr Penelope Coop  . Diverticulosis   . Phlebitis   . Osteoarthritis    in Bilateral knees and back. -Dr Latanya Maudlin  . Panic attacks   . Anxiety   . IBS (irritable bowel syndrome)   . Hypothyroidism     goiter  . OSA (obstructive sleep apnea) Dx: 2008    could never use CPAP repeat study showed no sleep apnea (HST 03-03-13 ESS 8, AHI 2/hr)  . Vitamin D deficiency   . CVA (cerebral infarction) 1964  . Osteoporosis   . Trigeminal neuralgia     r face--Dr Dohmeier  . Hypercholesterolemia   . Chronic kidney disease     Stage III  . Dementia     Dr Brett Fairy   Past Surgical History  Procedure Laterality Date  . Blood clots  06/2006    lower abdomen  . Pulmonary embolism surgery  03/2005  . Phlebitis  12/2005  . Cholecystectomy  01/1983  . Biopsies of breast Bilateral 1971  . Migraine speech impairment  07/1963  . Hemorrhoid surgery  1966  . Appendectomy  1945  . Tonsillectomy      1954  . Gallbladder surgery  1980  . Insertion of vena cova filter  2007  . Leg / ankle soft tissue biopsy Right     precancerous  . Cardiac catheterization  08/2001    A-fib/Palpitations Dr. Daneen Schick  . Dilation and curettage, diagnostic / therapeutic  1952/1977    x2  .  Foot surgery Right 1978    Cyst removed from Right Foot   Social History:  reports that she has never smoked. She has never used smokeless tobacco. She reports that she does not drink alcohol or use illicit drugs.  Allergies  Allergen Reactions  . Benadryl [Diphenhydramine Hcl] Hives and Shortness Of Breath  . Eye Drops [Tetrahydrazoline Hcl] Other (See Comments)    Burning, itching, swelling  . Flu Virus Vaccine Shortness Of Breath  . Iohexol Hives and Shortness Of Breath     Code: HIVES, Desc: PT IS ALLERGIC TO IVP DYE/IODINE/SHRIMP.  CAUSES SOB AND HIVES PER PT.  STEPHANIE, RT-R, Onset Date: [id number]   . Shrimp [Shellfish Allergy] Anaphylaxis  . Voltaren [Diclofenac Sodium] Other (See Comments)    Acid reflux symptoms  . Ambien [Zolpidem Tartrate] Hives  . Gluten Meal Diarrhea  . Travatan Z  [Travoprost] Other (See Comments)    BRAND NAME ONLY OLCANNOT TOLERATE GENERIC FORM OF EYE DROPS, IT CONTAINS PRESERVATIVES-SENSITIVITY  . Ativan [Lorazepam]     delusional  . Hydrocodone     delusions  . Betadine Antibiotic-Moisturize [Bacitracin-Polymyxin B] Rash  . Biaxin [Clarithromycin] Hives  . Celebrex [Celecoxib] Rash  . Ciprofloxacin Hcl Rash       . Combigan [Brimonidine Tartrate-Timolol] Other (See Comments)    unknown  . Coumadin [Warfarin Sodium] Rash  . Ivp Dye [Iodinated Diagnostic Agents] Hives  . Levaquin [Levofloxacin In D5w] Rash  . Naprosyn [Naproxen] Rash  . Pataday [Olopatadine Hcl] Other (See Comments)  . Penicillins Hives  . Plavix [Clopidogrel Bisulfate] Rash  . Septra [Sulfamethoxazole-Tmp Ds] Hives    Family History  Problem Relation Age of Onset  . Diabetes Mother   . Hypertension Mother   . Glaucoma Mother   . CVA Mother   . CAD Mother   . Hypertension Father   . Heart attack Father   . Kidney failure Father   . Heart attack Brother     2 half brothers  . Hypertension Brother   . CVA Brother   . Glaucoma Other   . Glaucoma Other   . Breast cancer Maternal Aunt   . Colon cancer Maternal Uncle   . Rectal cancer Maternal Uncle   . Prostate cancer Maternal Grandfather      Prior to Admission medications   Medication Sig Start Date End Date Taking? Authorizing Provider  acetaminophen-codeine (TYLENOL #3) 300-30 MG per tablet Take 1 tablet by mouth every 6 (six) hours as needed for moderate pain. 07/23/13  Yes Larene Pickett, PA-C  amLODipine (NORVASC) 5 MG tablet Take 1 tablet (5 mg total) by mouth daily. 05/25/13  Yes Belva Crome III, MD  carbamazepine (CARBATROL) 100 MG 12 hr capsule Take 200-300 mg by mouth 3 (three) times daily. Takes 300mg  in am, 200mg  at noon, 300mg  at bedtime (all after meals)   Yes Historical Provider, MD  enoxaparin (LOVENOX) 100 MG/ML injection Inject 100 mg into the skin at bedtime.    Yes Historical Provider, MD    Fe Fum-FA-B Cmp-C-Zn-Mg-Mn-Cu (HEMOCYTE PLUS) 106-1 MG CAPS Take 1 capsule by mouth daily.    Yes Historical Provider, MD  folic acid (FOLVITE) 1 MG tablet Take 1 mg by mouth every morning.    Yes Historical Provider, MD  Gabapentin, PHN, 300 MG TABS Take 300 mg by mouth at bedtime.   Yes Historical Provider, MD  HYDROcodone-acetaminophen (NORCO/VICODIN) 5-325 MG per tablet Take 1 tablet by mouth every 6 (six) hours as needed for  moderate pain.   Yes Historical Provider, MD  levothyroxine (SYNTHROID, LEVOTHROID) 75 MCG tablet Take 75 mcg by mouth daily before breakfast.    Yes Historical Provider, MD  loratadine (CLARITIN) 10 MG tablet Take 10 mg by mouth daily.   Yes Historical Provider, MD  Multiple Vitamin (MULTIVITAMIN) tablet Take 1 tablet by mouth every morning.    Yes Historical Provider, MD  pravastatin (PRAVACHOL) 40 MG tablet Take 40 mg by mouth every morning.    Yes Historical Provider, MD  RABEprazole (ACIPHEX) 20 MG tablet Take 20 mg by mouth every morning.    Yes Historical Provider, MD  sodium chloride 1 G tablet Take 1 tablet (1 g total) by mouth 2 (two) times daily with a meal. 08/07/13  Yes Donne Hazel, MD  Travoprost, BAK Free, (TRAVATAN) 0.004 % SOLN ophthalmic solution Place 1 drop into both eyes at bedtime.   Yes Historical Provider, MD  EPINEPHrine (EPIPEN IJ) Inject as directed once as needed. For allergic reactions    Historical Provider, MD  Vitamin D, Ergocalciferol, (DRISDOL) 50000 UNITS CAPS capsule Take 50,000 Units by mouth every Saturday.     Historical Provider, MD   Physical Exam: Filed Vitals:   12/03/13 1115 12/03/13 1145 12/03/13 1200 12/03/13 1202  BP: 136/82 143/59 140/65 140/65  Pulse: 80 73 76 74  Temp:    97.7 F (36.5 C)  TempSrc:    Oral  Resp: 22 13 21 18   Height:      Weight:      SpO2: 100% 99% 100% 100%    Wt Readings from Last 3 Encounters:  12/03/13 96.616 kg (213 lb)  12/03/13 96.888 kg (213 lb 9.6 oz)  11/15/13 97.07 kg (214 lb)     General:  Appears calm and comfortable Eyes: PERRL, normal lids, irises & conjunctiva ENT: grossly normal hearing, lips & tongue Neck: no LAD, masses or thyromegaly Cardiovascular: RRR, no m/r/g. No LE edema. Telemetry: SR, no arrhythmias  Respiratory: CTA bilaterally, no w/r/r. Normal respiratory effort. Abdomen: soft, ntnd Skin: no rash or induration seen on limited exam Musculoskeletal: grossly normal tone BUE/BLE Psychiatric: grossly normal mood and affect, speech fluent and appropriate Neurologic: some stuttering/word finding difficulties          Labs on Admission:  Basic Metabolic Panel:  Recent Labs Lab 12/03/13 0953  NA 134*  K 5.1  CL 98  CO2 23  GLUCOSE 86  BUN 19  CREATININE 1.19*  CALCIUM 9.0   Liver Function Tests: No results found for this basename: AST, ALT, ALKPHOS, BILITOT, PROT, ALBUMIN,  in the last 168 hours No results found for this basename: LIPASE, AMYLASE,  in the last 168 hours No results found for this basename: AMMONIA,  in the last 168 hours CBC:  Recent Labs Lab 12/03/13 0953  WBC 5.1  NEUTROABS 3.1  HGB 10.0*  HCT 29.6*  MCV 82.5  PLT 163   Cardiac Enzymes: No results found for this basename: CKTOTAL, CKMB, CKMBINDEX, TROPONINI,  in the last 168 hours  BNP (last 3 results) No results found for this basename: PROBNP,  in the last 8760 hours CBG: No results found for this basename: GLUCAP,  in the last 168 hours  Radiological Exams on Admission: Ct Head Wo Contrast  12/03/2013   CLINICAL DATA:  Sudden onset aphasia, stuttering and difficulty with speech yesterday  EXAM: CT HEAD WITHOUT CONTRAST  TECHNIQUE: Contiguous axial images were obtained from the base of the skull through the vertex without  intravenous contrast.  COMPARISON:  Brain MRI 12/01/2013 ; head CT 07/23/2013  FINDINGS: Negative for acute intracranial hemorrhage, acute infarction, mass, mass effect, hydrocephalus or midline shift. Gray-white differentiation is  preserved throughout. Similar degree of advanced confluent periventricular white matter hypoattenuation. While nonspecific, the findings are most suggestive of longstanding chronic microvascular ischemic white matter disease. Remote infarct in the left corona radiata. No focal scalp or calvarial abnormality. Atherosclerotic calcification of the bilateral cavernous carotid arteries. Normal aeration of the mastoid air cells and visualized paranasal sinuses.  IMPRESSION: 1. No acute intracranial abnormality. 2. Stable chronic microvascular ischemic white matter disease.   Electronically Signed   By: Jacqulynn Cadet M.D.   On: 12/03/2013 10:40    EKG: Independently reviewed. sinus  Assessment/Plan Active Problems:   Chronic anticoagulation   Diabetes   Essential hypertension   Hyponatremia   TIA (transient ischemic attack)   UTI (lower urinary tract infection)  ?TIA- MRI pending- hold on echo/carotids until confirmed CVA -suspect a combo of dementia/UTI/age caused symptoms of word finding difficulties  UTI- rocephin (has PCN allergy but tolerated rocephin in ER), culture  DM- SSI  Hyponatremia/SIADH- on salt tabs     neurology  Code Status: full DVT Prophylaxis: Family Communication: patient and caregiver at bedside Disposition Plan:   Time spent: 35 min  Eulogio Bear [facility] Pager [phone]  **Disclaimer: This note may have been dictated with voice recognition software. Similar sounding words can inadvertently be transcribed and this note may contain transcription errors which may not have been corrected upon publication of note.**

## 2013-12-03 NOTE — Consult Note (Signed)
Referring Physician: Wilson Singer    Chief Complaint: Slurred speech  HPI:                                                                                                                                         [name] is an 80 y.o. female hypertension, CAD, atrial fibrillation, stroke with right-sided weakness, hypercholesterolemia, diabetes mellitus, DVT and pulmonary embolism (on Lovenox), migraine, herpes zoster, sleep apnea, gait disorder, memory problems and anxiety who follows up for memory problems and right post-herpetic trigeminal neuralgia. Patient obtained a MRI brain on 12-01-2013 for evaluation of cognitive decline. This showed extensive small vessel ischemic changes throughout the deep and subcortical white matter, including remote small infarcts involving the cerebellum and left basal ganglia. No acute infarct noted.  She noted she was generally lethargic after the MRI.  She woke up yesterday noting stuttering speech and difficulty getting her words out. She states when she sings she has no speech difficulty. Due to continued symptoms she was sent to ED.  Currently she has periods of clear speech and periods where she stutters when trying to start a sentence. She denies weakness, numbness, tingling, blurred or double vision, lack of vision.   She is on daily Lovenox 100 mg QHS  Date last known well: Date: 12/02/2013 Time last known well: Unable to determine tPA Given: No: out of window  Past Medical History  Diagnosis Date  . Shingles     zoster 1990 on the back and on the abdomen in 2007,facial zoster 2006  . Hypertension   . A-fib   . Diabetes mellitus without complication   . Glaucoma   . Pulmonary embolism recurrent     IVC filter  . Acid reflux   . Arthritis   . Migraine   . Postherpetic trigeminal neuralgia 10/20/2012  . Unspecified cerebral artery occlusion with cerebral infarction   . Memory loss   . Chronic anticoagulation 03/07/2013    Lovenox 100 QD  .  Junctional bradycardia     resolved with discontinuation of sotalol (2014) after 10 yrs  . DVT (deep venous thrombosis) 2007    recurrent w PE s/p [place] filter  . Hypoglycemia   . GERD (gastroesophageal reflux disease)     w hiatal hernia endoscopy 2003, Dr Penelope Coop  . Diverticulosis   . Phlebitis   . Osteoarthritis     in Bilateral knees and back. -Dr Latanya Maudlin  . Panic attacks   . Anxiety   . IBS (irritable bowel syndrome)   . Hypothyroidism     goiter  . OSA (obstructive sleep apnea) Dx: 2008    could never use CPAP repeat study showed no sleep apnea (HST 03-03-13 ESS 8, AHI 2/hr)  . Vitamin D deficiency   . CVA (cerebral infarction) 1964  . Osteoporosis   . Trigeminal neuralgia     r face--Dr Dohmeier  . Hypercholesterolemia   .  Chronic kidney disease     Stage III  . Dementia     Dr Brett Fairy    Past Surgical History  Procedure Laterality Date  . Blood clots  06/2006    lower abdomen  . Pulmonary embolism surgery  03/2005  . Phlebitis  12/2005  . Cholecystectomy  01/1983  . Biopsies of breast Bilateral 1971  . Migraine speech impairment  07/1963  . Hemorrhoid surgery  1966  . Appendectomy  1945  . Tonsillectomy      1954  . Gallbladder surgery  1980  . Insertion of vena cova filter  2007  . Leg / ankle soft tissue biopsy Right     precancerous  . Cardiac catheterization  08/2001    A-fib/Palpitations Dr. Daneen Schick  . Dilation and curettage, diagnostic / therapeutic  1952/1977    x2  . Foot surgery Right 1978    Cyst removed from Right Foot    Family History  Problem Relation Age of Onset  . Diabetes Mother   . Hypertension Mother   . Glaucoma Mother   . CVA Mother   . CAD Mother   . Hypertension Father   . Heart attack Father   . Kidney failure Father   . Heart attack Brother     2 half brothers  . Hypertension Brother   . CVA Brother   . Glaucoma Other   . Glaucoma Other   . Breast cancer Maternal Aunt   . Colon cancer Maternal Uncle   .  Rectal cancer Maternal Uncle   . Prostate cancer Maternal Grandfather    Social History:  reports that she has never smoked. She has never used smokeless tobacco. She reports that she does not drink alcohol or use illicit drugs.  Allergies:  Allergies  Allergen Reactions  . Benadryl [Diphenhydramine Hcl] Hives and Shortness Of Breath  . Eye Drops [Tetrahydrazoline Hcl] Other (See Comments)    Burning, itching, swelling  . Flu Virus Vaccine Shortness Of Breath  . Iohexol Hives and Shortness Of Breath     Code: HIVES, Desc: PT IS ALLERGIC TO IVP DYE/IODINE/SHRIMP.  CAUSES SOB AND HIVES PER PT.  STEPHANIE, RT-R, Onset Date: [id number]   . Shrimp [Shellfish Allergy] Anaphylaxis  . Voltaren [Diclofenac Sodium] Other (See Comments)    Acid reflux symptoms  . Ambien [Zolpidem Tartrate] Hives  . Gluten Meal Diarrhea  . Travatan Z [Travoprost] Other (See Comments)    BRAND NAME ONLY OLCANNOT TOLERATE GENERIC FORM OF EYE DROPS, IT CONTAINS PRESERVATIVES-SENSITIVITY  . Ativan [Lorazepam]     delusional  . Hydrocodone     delusions  . Betadine Antibiotic-Moisturize [Bacitracin-Polymyxin B] Rash  . Biaxin [Clarithromycin] Hives  . Celebrex [Celecoxib] Rash  . Ciprofloxacin Hcl Rash       . Combigan [Brimonidine Tartrate-Timolol] Other (See Comments)    unknown  . Coumadin [Warfarin Sodium] Rash  . Ivp Dye [Iodinated Diagnostic Agents] Hives  . Levaquin [Levofloxacin In D5w] Rash  . Naprosyn [Naproxen] Rash  . Pataday [Olopatadine Hcl] Other (See Comments)  . Penicillins Hives  . Plavix [Clopidogrel Bisulfate] Rash  . Septra [Sulfamethoxazole-Tmp Ds] Hives    Medications:  No current facility-administered medications for this encounter.   Current Outpatient Prescriptions  Medication Sig Dispense Refill  . acetaminophen-codeine (TYLENOL #3) 300-30 MG per tablet  Take 1 tablet by mouth every 6 (six) hours as needed for moderate pain.  30 tablet  0  . amLODipine (NORVASC) 5 MG tablet Take 1 tablet (5 mg total) by mouth daily.  30 tablet  11  . carbamazepine (CARBATROL) 100 MG 12 hr capsule Take 200-300 mg by mouth 3 (three) times daily. Takes 300mg  in am, 200mg  at noon, 300mg  at bedtime (all after meals)      . enoxaparin (LOVENOX) 100 MG/ML injection Inject 100 mg into the skin at bedtime.       . Fe Fum-FA-B Cmp-C-Zn-Mg-Mn-Cu (HEMOCYTE PLUS) 106-1 MG CAPS Take 1 capsule by mouth daily.       . folic acid (FOLVITE) 1 MG tablet Take 1 mg by mouth every morning.       . Gabapentin, PHN, 300 MG TABS Take 300 mg by mouth at bedtime.      Marland Kitchen HYDROcodone-acetaminophen (NORCO/VICODIN) 5-325 MG per tablet Take 1 tablet by mouth every 6 (six) hours as needed for moderate pain.      Marland Kitchen levothyroxine (SYNTHROID, LEVOTHROID) 75 MCG tablet Take 75 mcg by mouth daily before breakfast.       . loratadine (CLARITIN) 10 MG tablet Take 10 mg by mouth daily.      . Multiple Vitamin (MULTIVITAMIN) tablet Take 1 tablet by mouth every morning.       . pravastatin (PRAVACHOL) 40 MG tablet Take 40 mg by mouth every morning.       . RABEprazole (ACIPHEX) 20 MG tablet Take 20 mg by mouth every morning.       . sodium chloride 1 G tablet Take 1 tablet (1 g total) by mouth 2 (two) times daily with a meal.  60 tablet  0  . Travoprost, BAK Free, (TRAVATAN) 0.004 % SOLN ophthalmic solution Place 1 drop into both eyes at bedtime.      Marland Kitchen EPINEPHrine (EPIPEN IJ) Inject as directed once as needed. For allergic reactions      . Vitamin D, Ergocalciferol, (DRISDOL) 50000 UNITS CAPS capsule Take 50,000 Units by mouth every Saturday.          ROS:                                                                                                                                       History obtained from the patient  General ROS: negative for - chills, fatigue, fever, night sweats, weight gain or  weight loss Psychological ROS: negative for - behavioral disorder, hallucinations, memory difficulties, mood swings or suicidal ideation Ophthalmic ROS: negative for - blurry vision, double vision, eye pain or loss of vision ENT ROS: negative for - epistaxis, nasal discharge, oral lesions, sore throat, tinnitus or vertigo Allergy and Immunology  ROS: negative for - hives or itchy/watery eyes Hematological and Lymphatic ROS: negative for - bleeding problems, bruising or swollen lymph nodes Endocrine ROS: negative for - galactorrhea, hair pattern changes, polydipsia/polyuria or temperature intolerance Respiratory ROS: negative for - cough, hemoptysis, shortness of breath or wheezing Cardiovascular ROS: negative for - chest pain, dyspnea on exertion, edema or irregular heartbeat Gastrointestinal ROS: negative for - abdominal pain, diarrhea, hematemesis, nausea/vomiting or stool incontinence Genito-Urinary ROS: negative for - dysuria, hematuria, incontinence or urinary frequency/urgency Musculoskeletal ROS: negative for - joint swelling or muscular weakness Neurological ROS: as noted in HPI Dermatological ROS: negative for rash and skin lesion changes  Neurologic Examination:                                                                                                      Blood pressure 140/65, pulse 74, temperature 97.7 F (36.5 C), temperature source Oral, resp. rate 18, height 5\' 5"  (1.651 m), weight 96.616 kg (213 lb), SpO2 100.00%.   General: NAD Mental Status: Alert, oriented, thought content appropriate.  Speech intermittently stuttering when starting a sentence and other times fluent without evidence of aphasia.  Able to follow 3 step commands without difficulty. Cranial Nerves: II: Discs flat bilaterally; Visual fields grossly normal, pupils equal, round, reactive to light and accommodation III,IV, VI: ptosis not present, extra-ocular motions intact bilaterally V,VII: smile  symmetric, facial light touch sensation normal bilaterally VIII: hearing normal bilaterally IX,X: gag reflex present XI: bilateral shoulder shrug XII: midline tongue extension without atrophy or fasciculations  Motor: Right : Upper extremity   5/5    Left:     Upper extremity   5/5  Lower extremity   5/5     Lower extremity   5/5 Tone and bulk:normal tone throughout; no atrophy noted Sensory: Pinprick and light touch intact throughout, bilaterally Deep Tendon Reflexes:  Right: Upper Extremity   Left: Upper extremity   biceps (C-5 to C-6) 2/4   biceps (C-5 to C-6) 2/4 tricep (C7) 2/4    triceps (C7) 2/4 Brachioradialis (C6) 2/4  Brachioradialis (C6) 2/4  Lower Extremity Lower Extremity  quadriceps (L-2 to L-4) 2/4   quadriceps (L-2 to L-4) 2/4 Achilles (S1) 1/4   Achilles (S1) 1/4  Plantars: Right: downgoing   Left: downgoing Cerebellar: normal finger-to-nose,  normal heel-to-shin test Gait: not tested.  CV: pulses palpable throughout    Lab Results: Basic Metabolic Panel:  Recent Labs Lab 12/03/13 0953  NA 134*  K 5.1  CL 98  CO2 23  GLUCOSE 86  BUN 19  CREATININE 1.19*  CALCIUM 9.0    Liver Function Tests: No results found for this basename: AST, ALT, ALKPHOS, BILITOT, PROT, ALBUMIN,  in the last 168 hours No results found for this basename: LIPASE, AMYLASE,  in the last 168 hours No results found for this basename: AMMONIA,  in the last 168 hours  CBC:  Recent Labs Lab 12/03/13 0953  WBC 5.1  NEUTROABS 3.1  HGB 10.0*  HCT 29.6*  MCV 82.5  PLT 163    Cardiac Enzymes: No  results found for this basename: CKTOTAL, CKMB, CKMBINDEX, TROPONINI,  in the last 168 hours  Lipid Panel: No results found for this basename: CHOL, TRIG, HDL, CHOLHDL, VLDL, LDLCALC,  in the last 168 hours  CBG: No results found for this basename: GLUCAP,  in the last 168 hours  Microbiology: Results for orders placed during the hospital encounter of 03/06/13  MRSA PCR  SCREENING     Status: None   Collection Time    03/07/13 12:35 AM      Result Value Ref Range Status   MRSA by PCR NEGATIVE  NEGATIVE Final   Comment:            The GeneXpert MRSA Assay (FDA     approved for NASAL specimens     only), is one component of a     comprehensive MRSA colonization     surveillance program. It is not     intended to diagnose MRSA     infection nor to guide or     monitor treatment for     MRSA infections.  URINE CULTURE     Status: None   Collection Time    03/07/13  3:30 PM      Result Value Ref Range Status   Specimen Description URINE, CLEAN CATCH   Final   Special Requests Normal   Final   Culture  Setup Time     Final   Value: 03/07/2013 21:36     Performed at [facility]     Final   Value: 15,000 COLONIES/ML     Performed at [facility]   Culture     Final   Value: ESCHERICHIA COLI     Performed at [facility]   Report Status 03/09/2013 FINAL   Final   Organism ID, Bacteria ESCHERICHIA COLI   Final    Coagulation Studies: No results found for this basename: LABPROT, INR,  in the last 72 hours  Imaging: Ct Head Wo Contrast  12/03/2013   CLINICAL DATA:  Sudden onset aphasia, stuttering and difficulty with speech yesterday  EXAM: CT HEAD WITHOUT CONTRAST  TECHNIQUE: Contiguous axial images were obtained from the base of the skull through the vertex without intravenous contrast.  COMPARISON:  Brain MRI 12/01/2013 ; head CT 07/23/2013  FINDINGS: Negative for acute intracranial hemorrhage, acute infarction, mass, mass effect, hydrocephalus or midline shift. Gray-white differentiation is preserved throughout. Similar degree of advanced confluent periventricular white matter hypoattenuation. While nonspecific, the findings are most suggestive of longstanding chronic microvascular ischemic white matter disease. Remote infarct in the left corona radiata. No focal scalp or calvarial abnormality. Atherosclerotic  calcification of the bilateral cavernous carotid arteries. Normal aeration of the mastoid air cells and visualized paranasal sinuses.  IMPRESSION: 1. No acute intracranial abnormality. 2. Stable chronic microvascular ischemic white matter disease.   Electronically Signed   By: Jacqulynn Cadet M.D.   On: 12/03/2013 10:40       Assessment and plan discussed with with attending physician and they are in agreement.    Etta Quill PA-C [facility] [phone]  12/03/2013, 12:41 PM   Assessment: 80 y.o. female with new onset stuttering speech and intermittent difficulty with word finding.  CT head is negative for acute infarct. Exam shows difficulty getting intermittent words out but no aphasia. Labs while in ED does show UA (+) for UTI. Although cannot exclude possible stroke, word finding difficulty likely due to UTI.   Stroke  Risk Factors - atrial fibrillation, diabetes mellitus, hyperlipidemia and hypertension   Recommend: 1) MRI brain --If positive for stroke continue with stroke workup 2) Treat underlying UTI.   Patient seen and examined together with physician assistant and I concur with the assessment and plan.  Dorian Pod, MD

## 2013-12-03 NOTE — ED Notes (Signed)
Patient returned from MRI.

## 2013-12-03 NOTE — Patient Instructions (Signed)
1.  I am concerned you have had an acute stroke.  Given the significant trouble talking and your low oxygen saturation, I want you evaluated in the ED immediately.

## 2013-12-03 NOTE — Progress Notes (Signed)
NEUROLOGY FOLLOW UP OFFICE NOTE  [name] [id number]  HISTORY OF PRESENT ILLNESS: [name] is an 80 year old right-handed woman with history of hypertension, CAD, atrial fibrillation, stroke with right-sided weakness, hypercholesterolemia, diabetes mellitus, DVT and pulmonary embolism (on Lovenox), migraine, herpes zoster, sleep apnea, gait disorder, memory problems and anxiety who follows up for memory problems and right post-herpetic trigeminal neuralgia.  She is accompanied by her friend.  UPDATE: To evaluate cognitive problems, an MRI of the brain was performed on 12/01/13, which revealed extensive small vessel ischemic changes throughout the deep and subcortical white matter, including remote small infarcts involving the cerebellum and left basal ganglia.  No acute findings noted.  Yesterday, she woke up and noted difficulty talking.  She has difficulty getting words out.  When she woke up yesterday, she noted left sided headache, which has since resolved.  I called her yesterday to discuss the MRI results, but she didn't call me back because she couldn't talk.  She denies any vision or focal numbness or weakness.  Her O2 saturation was 92%.  Vitamin B12 level from 11/15/13 was 703.  Other recent labs from 09/10/12 include WBC 6.1, HGB 9.8, HCT 29.8, PLT 120, Na 135, K 5.2, Cl 107, glucose 89, BUN 19, Cr 1.16, ALP 83, ALT 7, AST 12, cholesterol 141 and LDL 66.  I  ATYPICAL RIGHT FACIAL PAIN: History: Onset of symptoms started in 2006, following episode of herpes zoster involving she describes sharp pain involving the the right V1 and V2 distribution, associated with allodynia. Chewing, talking, and brushing her teeth triggers and exacerbates the pain.  She was subsequently treated for postherpetic neuralgia.  Hasn't had an attack since August 2014.  Current medication:  Carbatrol 300mg /200mg /300mg , Gabapentin 300mg  at bedtime.   Past medications:   carbamazepine generic which  led to breakthrough pain.    09/23/08 MRI BRAIN WO: Small vessel disease, remote infarcts left caudate and left cerebellum 09/23/08 MRA/MRV HEAD:  Unremarkable.  07/23/13 CT Head:  Small vessel ischemic changes with old strokes in the left cerebellar hemisphere and body of caudate, stable from prior studies.  LABS: 04/22/13 carbamazepine level 7 08/05/13 Hgb A1c 5.9 08/06/13 WBC 6.7, HGB 10, HCT 29.3, PLT 191, MCV 82.3 08/07/13:  Na 131, K 4.4, Cl 96, bicarb 22, BUN 23, Cr 1.21, Ca 9  II.  COGNITIVE DEFICITS: History: This started about a year ago after initiation of gabapentin. It was fairly sudden onset and has not progressed.  She tends to repeat herself often, such as telling stories. She repeats questions often. She reports word-finding difficulties as well.  She has no difficulty typically with performing ADLs. She typically lives by herself in her one story house. She manages her own finances. She doesn't drive 2 to remote history of blackout spells. She typically does not have problems recognizing people or recalling names. She denies any depression. She denies any hallucinations. She does have history of vivid nightmares. Earlier this month, she fell bruising her hip and fracturing her arm. In June 2015, she was started on Vicodin and then developed more cognitive clouding. As symptoms began after initiation of gabapentin, and became worse after starting Vicodin, she was asked to return for re-evaluation after tapering off these medications.  She only takes the Vicodin occasionally now.  There has been no improvement in memory.  She reports a maternal aunt with history of dementia.  Of note, she has history of SIADH, in which she has had excessive urination.  Earlier this month, she was found to be hyponatremic, thought to be due to dehydration. Her sodium was at 124. This was corrected. Most recent labs from 08/07/13 revealed sodium of 131 and creatinine of 1.21.  She also reports previous episodes  of bi-temporal head pain associated with feeling of lightheadedness.  It is not associated with spinning sensation.  It lasted about 2 weeks and resolved about a week ago.  It was different than her usual vertigo.  PAST MEDICAL HISTORY: Past Medical History  Diagnosis Date  . Shingles     zoster 1990 on the back and on the abdomen in 2007,facial zoster 2006  . Hypertension   . A-fib   . Diabetes mellitus without complication   . Glaucoma   . Pulmonary embolism recurrent     IVC filter  . Acid reflux   . Arthritis   . Migraine   . Postherpetic trigeminal neuralgia 10/20/2012  . Unspecified cerebral artery occlusion with cerebral infarction   . Memory loss   . Chronic anticoagulation 03/07/2013    Lovenox 100 QD  . Junctional bradycardia     resolved with discontinuation of sotalol (2014) after 10 yrs  . DVT (deep venous thrombosis) 2007    recurrent w PE s/p [place] filter  . Hypoglycemia   . GERD (gastroesophageal reflux disease)     w hiatal hernia endoscopy 2003, Dr Penelope Coop  . Diverticulosis   . Phlebitis   . Osteoarthritis     in Bilateral knees and back. -Dr Latanya Maudlin  . Panic attacks   . Anxiety   . IBS (irritable bowel syndrome)   . Hypothyroidism     goiter  . OSA (obstructive sleep apnea) Dx: 2008    could never use CPAP repeat study showed no sleep apnea (HST 03-03-13 ESS 8, AHI 2/hr)  . Vitamin D deficiency   . CVA (cerebral infarction) 1964  . Osteoporosis   . Trigeminal neuralgia     r face--Dr Dohmeier  . Hypercholesterolemia   . Chronic kidney disease     Stage III  . Dementia     Dr Brett Fairy    MEDICATIONS: Current Outpatient Prescriptions on File Prior to Visit  Medication Sig Dispense Refill  . acetaminophen-codeine (TYLENOL #3) 300-30 MG per tablet Take 1 tablet by mouth every 6 (six) hours as needed for moderate pain.  30 tablet  0  . amLODipine (NORVASC) 5 MG tablet Take 1 tablet (5 mg total) by mouth daily.  30 tablet  11  . carbamazepine  (CARBATROL) 100 MG 12 hr capsule Take 200-300 mg by mouth 3 (three) times daily. Takes 300mg  in am, 200mg  at noon, 300mg  at bedtime (all after meals)      . CARBATROL 100 MG 12 hr capsule TAKE 3 caps BY MOUTH EVERY MORNING, 2 CAPS AT NOON, AND 3 caps AT BEDTIME. TAKE AFTER MEALS.  240 capsule  3  . enoxaparin (LOVENOX) 100 MG/ML injection Inject 100 mg into the skin at bedtime.       Marland Kitchen EPINEPHrine (EPIPEN IJ) Inject as directed once as needed. For allergic reactions      . Fe Fum-FA-B Cmp-C-Zn-Mg-Mn-Cu (HEMOCYTE PLUS) 106-1 MG CAPS Take 1 capsule by mouth daily.       . folic acid (FOLVITE) 1 MG tablet Take 1 mg by mouth every morning.       . Gabapentin, PHN, 300 MG TABS Take 300 mg by mouth at bedtime.      Marland Kitchen HYDROcodone-acetaminophen (NORCO/VICODIN) 5-325 MG  per tablet Take 1 tablet by mouth every 6 (six) hours as needed for moderate pain.      Marland Kitchen levothyroxine (SYNTHROID, LEVOTHROID) 75 MCG tablet Take 75 mcg by mouth daily before breakfast.       . loratadine (CLARITIN) 10 MG tablet Take 10 mg by mouth daily.      . meclizine (ANTIVERT) 12.5 MG tablet Take 12.5 mg by mouth 3 (three) times daily as needed. For dizziness      . Menthol (EUCERIN SKIN CALMING) 0.1 % LOTN Apply 1 application topically daily as needed. For dry skin      . metaxalone (SKELAXIN) 800 MG tablet Take 0.5 tablets (400 mg total) by mouth 3 (three) times daily.  90 tablet  0  . Multiple Vitamin (MULTIVITAMIN) tablet Take 1 tablet by mouth every morning.       . pravastatin (PRAVACHOL) 40 MG tablet Take 40 mg by mouth every morning.       . RABEprazole (ACIPHEX) 20 MG tablet Take 20 mg by mouth every morning.       . sodium chloride 1 G tablet Take 1 tablet (1 g total) by mouth 2 (two) times daily with a meal.  60 tablet  0  . Travoprost, BAK Free, (TRAVATAN) 0.004 % SOLN ophthalmic solution Place 1 drop into both eyes at bedtime.      . Vitamin D, Ergocalciferol, (DRISDOL) 50000 UNITS CAPS capsule Take 50,000 Units by mouth  every Saturday.        No current facility-administered medications on file prior to visit.    ALLERGIES: Allergies  Allergen Reactions  . Benadryl [Diphenhydramine Hcl] Hives and Shortness Of Breath  . Eye Drops [Tetrahydrazoline Hcl] Other (See Comments)    Burning, itching, swelling  . Flu Virus Vaccine Shortness Of Breath  . Iohexol Hives and Shortness Of Breath     Code: HIVES, Desc: PT IS ALLERGIC TO IVP DYE/IODINE/SHRIMP.  CAUSES SOB AND HIVES PER PT.  STEPHANIE, RT-R, Onset Date: [id number]   . Shrimp [Shellfish Allergy] Anaphylaxis  . Voltaren [Diclofenac Sodium] Other (See Comments)    Acid reflux symptoms  . Ambien [Zolpidem Tartrate] Hives  . Gluten Meal Diarrhea  . Travatan Z [Travoprost] Other (See Comments)    BRAND NAME ONLY OLCANNOT TOLERATE GENERIC FORM OF EYE DROPS, IT CONTAINS PRESERVATIVES-SENSITIVITY  . Ativan [Lorazepam]     delusional  . Hydrocodone     delusions  . Betadine Antibiotic-Moisturize [Bacitracin-Polymyxin B] Rash  . Biaxin [Clarithromycin] Hives  . Celebrex [Celecoxib] Rash  . Ciprofloxacin Hcl Rash       . Combigan [Brimonidine Tartrate-Timolol] Other (See Comments)    unknown  . Coumadin [Warfarin Sodium] Rash  . Ivp Dye [Iodinated Diagnostic Agents] Hives  . Levaquin [Levofloxacin In D5w] Rash  . Naprosyn [Naproxen] Rash  . Pataday [Olopatadine Hcl] Other (See Comments)  . Penicillins Hives  . Plavix [Clopidogrel Bisulfate] Rash  . Septra [Sulfamethoxazole-Tmp Ds] Hives    FAMILY HISTORY: Family History  Problem Relation Age of Onset  . Diabetes Mother   . Hypertension Mother   . Glaucoma Mother   . CVA Mother   . CAD Mother   . Hypertension Father   . Heart attack Father   . Kidney failure Father   . Heart attack Brother     2 half brothers  . Hypertension Brother   . CVA Brother   . Glaucoma Other   . Glaucoma Other   . Breast cancer Maternal Aunt   .  Colon cancer Maternal Uncle   . Rectal cancer Maternal Uncle     . Prostate cancer Maternal Grandfather     SOCIAL HISTORY: History   Social History  . Marital Status: Divorced    Spouse Name: N/A    Number of Children: 3  . Years of Education: 14   Occupational History  . retired     Armed forces training and education officer   Social History Main Topics  . Smoking status: Never Smoker   . Smokeless tobacco: Never Used  . Alcohol Use: Yes     Comment: very moderate, none  in the last two months  . Drug Use: No  . Sexual Activity: Not on file   Other Topics Concern  . Not on file   Social History Narrative   Patient is retired and lives at home alone. Patient has a college education.    Patient has three children.   Patient is right-handed.   Patient drinks 1-2 cups of caffeine daily.    REVIEW OF SYSTEMS: Constitutional: No fevers, chills, or sweats, no generalized fatigue, change in appetite Eyes: No visual changes, double vision, eye pain Ear, nose and throat: No hearing loss, ear pain, nasal congestion, sore throat Cardiovascular: No chest pain, palpitations Respiratory:  No shortness of breath at rest or with exertion, wheezes GastrointestinaI: No nausea, vomiting, diarrhea, abdominal pain, fecal incontinence Genitourinary:  No dysuria, urinary retention or frequency Musculoskeletal:  No neck pain, back pain Integumentary: No rash, pruritus, skin lesions Neurological: as above Psychiatric: No depression, insomnia, anxiety Endocrine: No palpitations, fatigue, diaphoresis, mood swings, change in appetite, change in weight, increased thirst Hematologic/Lymphatic:  No anemia, purpura, petechiae. Allergic/Immunologic: no itchy/runny eyes, nasal congestion, recent allergic reactions, rashes  PHYSICAL EXAM: Filed Vitals:   12/03/13 0747  BP: 112/70  Pulse: 84  Resp: 16   General: No acute distress Head:  Normocephalic/atraumatic Neck: supple, no paraspinal tenderness, full range of motion Heart:  Regular rate and rhythm Lungs:  Clear to  auscultation bilaterally Back: No paraspinal tenderness Neurological Exam: alert and oriented to person, place, and time. Attention span and concentration intact, recent and remote memory intact, fund of knowledge intact.  Speech dysfluent but some periods of fluency but not dysarthric.  Mild difficulty with naming but able to repeat and write.  CN II-XII intact. Fundi not visualized.  Bulk and tone normal, muscle strength 5-/5 throughout.  Sensation to pinprick and vibration intact.  Deep tendon reflexes 2+ in upper extremities, absent in lower extremities.  She has an extensor first digit of the right foot related to surgery but with extensor reflex.  Left toe downgoing.  Finger to nose intact.  Gait wide based and unsteady requiring use of walker, Romberg negative.  IMPRESSION: 1.  Concerned for acute stroke as demonstrated by partial expressive aphasia.  She does have chronic extension of the big toe of her right foot due to prior surgery, but my exams in the past revealed plantar response, which is upgoing now. 2.  Vascular dementia 3.  Post-herpetic trigeminal neuralgia, stable  PLAN: Concern for acute stroke and mild hypoxia, so will have her go to ED. Sodium is stable, so will continue Carbatrol 300/200/300 and gabapentin 300mg  at bedtime.  Will periodically follow sodium levels. Would favor trial of Aricept.  Will have her follow up to discuss.  Metta Clines, DO  CC:  [name], MD

## 2013-12-03 NOTE — ED Provider Notes (Signed)
CSN: [id number]     Arrival date & time 12/03/13  6213 History   First MD Initiated Contact with Patient 12/03/13 0830     No chief complaint on file.    (Consider location/radiation/quality/duration/timing/severity/associated sxs/prior Treatment) HPI  84yF with expressive aphasia. Noted when woke up yesterday. Persistent since. Reports has had intermittently for past year or so but not ever to this degree or lasting this long. Seen by her neurologist before arrival and referred to ED. Additionally complaining of short term memory problems. She feels like this has been intermittent but sure if she necessarily noticed it at same as speech problems. She reports she can read outloud and also sing and stuttering is not as noticeable. Wrote checks out yesterday w/o apparent problem. Denies and HA. No other neurological complaints.   Past Medical History  Diagnosis Date  . Shingles     zoster 1990 on the back and on the abdomen in 2007,facial zoster 2006  . Hypertension   . A-fib   . Diabetes mellitus without complication   . Glaucoma   . Pulmonary embolism recurrent     IVC filter  . Acid reflux   . Arthritis   . Migraine   . Postherpetic trigeminal neuralgia 10/20/2012  . Unspecified cerebral artery occlusion with cerebral infarction   . Memory loss   . Chronic anticoagulation 03/07/2013    Lovenox 100 QD  . Junctional bradycardia     resolved with discontinuation of sotalol (2014) after 10 yrs  . DVT (deep venous thrombosis) 2007    recurrent w PE s/p [place] filter  . Hypoglycemia   . GERD (gastroesophageal reflux disease)     w hiatal hernia endoscopy 2003, Dr Penelope Coop  . Diverticulosis   . Phlebitis   . Osteoarthritis     in Bilateral knees and back. -Dr Latanya Maudlin  . Panic attacks   . Anxiety   . IBS (irritable bowel syndrome)   . Hypothyroidism     goiter  . OSA (obstructive sleep apnea) Dx: 2008    could never use CPAP repeat study showed no sleep apnea (HST 03-03-13  ESS 8, AHI 2/hr)  . Vitamin D deficiency   . CVA (cerebral infarction) 1964  . Osteoporosis   . Trigeminal neuralgia     r face--Dr Dohmeier  . Hypercholesterolemia   . Chronic kidney disease     Stage III  . Dementia     Dr Brett Fairy   Past Surgical History  Procedure Laterality Date  . Blood clots  06/2006    lower abdomen  . Pulmonary embolism surgery  03/2005  . Phlebitis  12/2005  . Cholecystectomy  01/1983  . Biopsies of breast Bilateral 1971  . Migraine speech impairment  07/1963  . Hemorrhoid surgery  1966  . Appendectomy  1945  . Tonsillectomy      1954  . Gallbladder surgery  1980  . Insertion of vena cova filter  2007  . Leg / ankle soft tissue biopsy Right     precancerous  . Cardiac catheterization  08/2001    A-fib/Palpitations Dr. Daneen Schick  . Dilation and curettage, diagnostic / therapeutic  1952/1977    x2  . Foot surgery Right 1978    Cyst removed from Right Foot   Family History  Problem Relation Age of Onset  . Diabetes Mother   . Hypertension Mother   . Glaucoma Mother   . CVA Mother   . CAD Mother   . Hypertension Father   .  Heart attack Father   . Kidney failure Father   . Heart attack Brother     2 half brothers  . Hypertension Brother   . CVA Brother   . Glaucoma Other   . Glaucoma Other   . Breast cancer Maternal Aunt   . Colon cancer Maternal Uncle   . Rectal cancer Maternal Uncle   . Prostate cancer Maternal Grandfather    History  Substance Use Topics  . Smoking status: Never Smoker   . Smokeless tobacco: Never Used  . Alcohol Use: Yes     Comment: very moderate, none  in the last two months   OB History   Grav Para Term Preterm Abortions TAB SAB Ect Mult Living                 Review of Systems  All systems reviewed and negative, other than as noted in HPI.   Allergies  Benadryl; Eye drops; Flu virus vaccine; Iohexol; Shrimp; Voltaren; Ambien; Gluten meal; Travatan z; Ativan; Hydrocodone; Betadine  antibiotic-moisturize; Biaxin; Celebrex; Ciprofloxacin hcl; Combigan; Coumadin; Ivp dye; Levaquin; Naprosyn; Pataday; Penicillins; Plavix; and Septra  Home Medications   Prior to Admission medications   Medication Sig Start Date End Date Taking? Authorizing Provider  acetaminophen-codeine (TYLENOL #3) 300-30 MG per tablet Take 1 tablet by mouth every 6 (six) hours as needed for moderate pain. 07/23/13   Larene Pickett, PA-C  amLODipine (NORVASC) 5 MG tablet Take 1 tablet (5 mg total) by mouth daily. 05/25/13   Belva Crome III, MD  carbamazepine (CARBATROL) 100 MG 12 hr capsule Take 200-300 mg by mouth 3 (three) times daily. Takes 300mg  in am, 200mg  at noon, 300mg  at bedtime (all after meals)    Historical Provider, MD  CARBATROL 100 MG 12 hr capsule TAKE 3 caps BY MOUTH EVERY MORNING, 2 CAPS AT NOON, AND 3 caps AT BEDTIME. TAKE AFTER MEALS. 08/05/13   Asencion Partridge Dohmeier, MD  enoxaparin (LOVENOX) 100 MG/ML injection Inject 100 mg into the skin at bedtime.     Historical Provider, MD  EPINEPHrine (EPIPEN IJ) Inject as directed once as needed. For allergic reactions    Historical Provider, MD  Fe Fum-FA-B Cmp-C-Zn-Mg-Mn-Cu (HEMOCYTE PLUS) 106-1 MG CAPS Take 1 capsule by mouth daily.     Historical Provider, MD  folic acid (FOLVITE) 1 MG tablet Take 1 mg by mouth every morning.     Historical Provider, MD  Gabapentin, PHN, 300 MG TABS Take 300 mg by mouth at bedtime.    Historical Provider, MD  HYDROcodone-acetaminophen (NORCO/VICODIN) 5-325 MG per tablet Take 1 tablet by mouth every 6 (six) hours as needed for moderate pain.    Historical Provider, MD  levothyroxine (SYNTHROID, LEVOTHROID) 75 MCG tablet Take 75 mcg by mouth daily before breakfast.     Historical Provider, MD  loratadine (CLARITIN) 10 MG tablet Take 10 mg by mouth daily.    Historical Provider, MD  meclizine (ANTIVERT) 12.5 MG tablet Take 12.5 mg by mouth 3 (three) times daily as needed. For dizziness    Historical Provider, MD  Menthol  (EUCERIN SKIN CALMING) 0.1 % LOTN Apply 1 application topically daily as needed. For dry skin    Historical Provider, MD  metaxalone (SKELAXIN) 800 MG tablet Take 0.5 tablets (400 mg total) by mouth 3 (three) times daily. 08/07/13   Donne Hazel, MD  Multiple Vitamin (MULTIVITAMIN) tablet Take 1 tablet by mouth every morning.     Historical Provider, MD  pravastatin (PRAVACHOL)  40 MG tablet Take 40 mg by mouth every morning.     Historical Provider, MD  RABEprazole (ACIPHEX) 20 MG tablet Take 20 mg by mouth every morning.     Historical Provider, MD  sodium chloride 1 G tablet Take 1 tablet (1 g total) by mouth 2 (two) times daily with a meal. 08/07/13   Donne Hazel, MD  Travoprost, BAK Free, (TRAVATAN) 0.004 % SOLN ophthalmic solution Place 1 drop into both eyes at bedtime.    Historical Provider, MD  Vitamin D, Ergocalciferol, (DRISDOL) 50000 UNITS CAPS capsule Take 50,000 Units by mouth every Saturday.     Historical Provider, MD   BP 145/78  Pulse 86  Resp 14  SpO2 100% Physical Exam  Nursing note and vitals reviewed. Constitutional: She appears well-developed and well-nourished. No distress.  HENT:  Head: Normocephalic and atraumatic.  Eyes: Conjunctivae are normal. Right eye exhibits no discharge. Left eye exhibits no discharge.  Neck: Neck supple.  Cardiovascular: Normal rate, regular rhythm and normal heart sounds.  Exam reveals no gallop and no friction rub.   No murmur heard. Pulmonary/Chest: Effort normal and breath sounds normal. No respiratory distress.  Abdominal: Soft. She exhibits no distension. There is no tenderness.  Musculoskeletal: She exhibits no edema and no tenderness.  Neurological: She is alert.  Awake. AOx3. Speech clear, but sometimes hesitant/stuttering. Same with reading text. Able to write several sentences w/o apparent difficulty/error. Three word recall was 0/3. CN intact. Strength 5/5 b/l u/l extremities. Good finger to nose b/l.   Skin: Skin is warm  and dry.  Psychiatric: She has a normal mood and affect. Her behavior is normal. Thought content normal.    ED Course  Procedures (including critical care time) Labs Review Labs Reviewed  CBC WITH DIFFERENTIAL - Abnormal; Notable for the following:    RBC 3.59 (*)    Hemoglobin 10.0 (*)    HCT 29.6 (*)    RDW 16.8 (*)    Monocytes Relative 15 (*)    All other components within normal limits  BASIC METABOLIC PANEL - Abnormal; Notable for the following:    Sodium 134 (*)    Creatinine, Ser 1.19 (*)    GFR calc non Af Amer 41 (*)    GFR calc Af Amer 48 (*)    All other components within normal limits  URINALYSIS, ROUTINE W REFLEX MICROSCOPIC - Abnormal; Notable for the following:    APPearance HAZY (*)    Nitrite POSITIVE (*)    Leukocytes, UA SMALL (*)    All other components within normal limits  URINE MICROSCOPIC-ADD ON - Abnormal; Notable for the following:    Squamous Epithelial / LPF FEW (*)    Bacteria, UA MANY (*)    All other components within normal limits  URINE CULTURE    Imaging Review Mr Brain Wo Contrast  12/01/2013   CLINICAL DATA:  Cognitive issues.  Memory loss.  EXAM: MRI HEAD WITHOUT CONTRAST  TECHNIQUE: Multiplanar, multiecho pulse sequences of the brain and surrounding structures were obtained without intravenous contrast.  COMPARISON:  10/30/2012.  09/23/2008.  FINDINGS: Diffusion imaging does not show any acute or subacute infarction. The brainstem is normal. There are old small vessel cerebellar infarctions, more on the left. The cerebral hemispheres show old infarction in the left basal ganglia and extensive chronic small vessel ischemic changes throughout the deep and subcortical white matter. No large vessel territory infarction. There are scattered foci of hemosiderin deposition related to old small  vessel infarctions. No evidence of previous large hemorrhage. No mass lesion, hydrocephalus or extra-axial collection. No pituitary mass. No inflammatory sinus  disease. No skull or skullbase lesion. Major vessels at the base of the brain show flow.  IMPRESSION: No acute or reversible finding. Extensive chronic small vessel ischemic changes throughout the brain as outlined above.   Electronically Signed   By: Nelson Chimes M.D.   On: 12/01/2013 13:27     EKG Interpretation   Date/Time:  Friday December 03 2013 08:36:53 EDT Ventricular Rate:  79 PR Interval:  163 QRS Duration: 86 QT Interval:  357 QTC Calculation: 409 R Axis:   0 Text Interpretation:  Sinus rhythm Abnormal R-wave progression, early  transition Left ventricular hypertrophy No significant change since last  tracing Confirmed by [place] ([id number]) on 12/03/2013 4:46:26 PM      MDM   Final diagnoses:  Expressive aphasia  UTI  84yF with speech and short term memory impairment. She reports that symptoms seem to resolve when singing/reading, but when reading for me she clearly has persistent symptoms. Seemed consistent with expressive aphasia, but wrote several sentences though w/o apparent difficulty. 0/3 on recall. Neuro exam otherwise nonfocal.. Outside of TPa window with symptoms stuttering over the past year and then acutely worse/persistent since yesterday morning. Also on lovenox, but being already being outside of window will defer from checking PTT. CT head. Basic Labs. EKG.    CT head neg. Incidentally noted UTI. Extensive allergy list including quinolones, PCN, trim/sulfa. PCN allergy listed as hives. Pt cannot tell me what specifically her symptoms are. I'm not a big fan of nitrofurantoin in elderly population. Will dose rocephin and observe closely.   Virgel Manifold, MD 12/08/13 1710

## 2013-12-03 NOTE — ED Notes (Addendum)
Was sent to ED from [facility] (Neurologist) office.  Expressive aphasia since yesterday morning, no other neurological deficits noted on initial exam.  Dr. Wilson Singer at bedside.  Yesterday morning patient states that her beautician noted that patient was having difficulty forming words, her friend also has noticed the symptoms as well.  These symptoms have never went away and have stayed continuously.  Patient has no difficulty singing or reading a book, nor does she have any difficulty writing.  Patient appears to be stuttering a lot which she says in unusual for her.  Also states that she has had increased trouble with memory recently. VSS, denies pain  In addition, patient states that on May 1st, she hit her head and had to have stiches placed, patient also has been having aches near that region. May 12th patient was in the hospital for hyponatremia.

## 2013-12-03 NOTE — ED Notes (Signed)
Meal delivered

## 2013-12-03 NOTE — ED Notes (Signed)
Patient to CT.

## 2013-12-03 NOTE — Progress Notes (Signed)
Quillian Quince, transport attempted to get patient for CT- RN requesting 20 minutes before patient ready for imaging @ [phone]

## 2013-12-04 DIAGNOSIS — I10 Essential (primary) hypertension: Secondary | ICD-10-CM | POA: Diagnosis not present

## 2013-12-04 DIAGNOSIS — R4701 Aphasia: Secondary | ICD-10-CM

## 2013-12-04 DIAGNOSIS — E039 Hypothyroidism, unspecified: Secondary | ICD-10-CM

## 2013-12-04 DIAGNOSIS — E119 Type 2 diabetes mellitus without complications: Secondary | ICD-10-CM

## 2013-12-04 DIAGNOSIS — R4789 Other speech disturbances: Secondary | ICD-10-CM | POA: Diagnosis not present

## 2013-12-04 DIAGNOSIS — Z7901 Long term (current) use of anticoagulants: Secondary | ICD-10-CM | POA: Diagnosis not present

## 2013-12-04 DIAGNOSIS — G458 Other transient cerebral ischemic attacks and related syndromes: Secondary | ICD-10-CM | POA: Diagnosis not present

## 2013-12-04 LAB — LIPID PANEL
CHOL/HDL RATIO: 2.1 ratio
Cholesterol: 156 mg/dL (ref 0–200)
HDL: 76 mg/dL (ref >39)
LDL Cholesterol: 66 mg/dL (ref 0–99)
Triglycerides: 72 mg/dL (ref <150)
VLDL: 14 mg/dL (ref 0–40)

## 2013-12-04 LAB — HEMOGLOBIN A1C
HEMOGLOBIN A1C: 5.8 % — AB (ref <5.7)
MEAN PLASMA GLUCOSE: 120 mg/dL — AB (ref <117)

## 2013-12-04 MED ORDER — FOSFOMYCIN TROMETHAMINE 3 G PO PACK: 3.0000 g | PACK | Freq: Once | ORAL | Status: DC

## 2013-12-04 MED ORDER — FOSFOMYCIN TROMETHAMINE 3 G PO PACK
3.0000 g | PACK | Freq: Once | ORAL | Status: AC
  Administered 2013-12-04: 3 g via ORAL
  Filled 2013-12-04: qty 3

## 2013-12-04 MED ORDER — TRAVOPROST (BAK FREE) 0.004 % OP SOLN
1.0000 [drp] | Freq: Every day | OPHTHALMIC | Status: DC
  Administered 2013-12-04: 1 [drp] via OPHTHALMIC
  Filled 2013-12-04: qty 2.5

## 2013-12-04 NOTE — Progress Notes (Signed)
UR completed 

## 2013-12-04 NOTE — Progress Notes (Signed)
NEURO HOSPITALIST PROGRESS NOTE   SUBJECTIVE:                                                                                                                        Resting comfortably in bed. Said that her language/speech is back to baseline and offers no new neurological complains. MRI brain negative for acute abnormality. UTI on ceftriaxone.   OBJECTIVE:                                                                                                                           Vital signs in last 24 hours: Temp:  [97.7 F (36.5 C)-98 F (36.7 C)] 97.7 F (36.5 C) (09/12 0537) Pulse Rate:  [62-77] 73 (09/12 0600) Resp:  [12-21] 16 (09/11 1800) BP: (115-154)/(47-96) 123/52 mmHg (09/12 0600) SpO2:  [95 %-100 %] 95 % (09/12 0600)  Intake/Output from previous day: 09/11 0701 - 09/12 0700 In: 980 [P.O.:980] Out: -  Intake/Output this shift: Total I/O In: 400 [P.O.:400] Out: -  Nutritional status:    Past Medical History  Diagnosis Date  . Shingles     zoster 1990 on the back and on the abdomen in 2007,facial zoster 2006  . Hypertension   . A-fib   . Glaucoma   . Pulmonary embolism recurrent     IVC filter  . Acid reflux   . Arthritis   . Migraine   . Postherpetic trigeminal neuralgia 10/20/2012  . Unspecified cerebral artery occlusion with cerebral infarction   . Memory loss   . Chronic anticoagulation 03/07/2013    Lovenox 100 QD  . Junctional bradycardia     resolved with discontinuation of sotalol (2014) after 10 yrs  . DVT (deep venous thrombosis) 2007    recurrent w PE s/p [place] filter  . Hypoglycemia   . GERD (gastroesophageal reflux disease)     w hiatal hernia endoscopy 2003, Dr Penelope Coop  . Diverticulosis   . Phlebitis   . Osteoarthritis     in Bilateral knees and back. -Dr Latanya Maudlin  . Panic attacks   . Anxiety   . IBS (irritable bowel syndrome)   . Hypothyroidism     goiter  . Vitamin D deficiency   . CVA  (  cerebral infarction) 1964  . Osteoporosis   . Trigeminal neuralgia     r face--Dr Dohmeier  . Hypercholesterolemia   . Chronic kidney disease     Stage III  . Dementia     Dr Dohmeier    Neurologic Exam:  Mental Status:  Alert, oriented, thought content appropriate. Speech intermittently stuttering when starting a sentence and other times fluent without evidence of aphasia. Able to follow 3 step commands without difficulty.  Cranial Nerves:  II: Discs flat bilaterally; Visual fields grossly normal, pupils equal, round, reactive to light and accommodation  III,IV, VI: ptosis not present, extra-ocular motions intact bilaterally  V,VII: smile symmetric, facial light touch sensation normal bilaterally  VIII: hearing normal bilaterally  IX,X: gag reflex present  XI: bilateral shoulder shrug  XII: midline tongue extension without atrophy or fasciculations  Motor:  Right : Upper extremity 5/5 Left: Upper extremity 5/5  Lower extremity 5/5 Lower extremity 5/5  Tone and bulk:normal tone throughout; no atrophy noted  Sensory: Pinprick and light touch intact throughout, bilaterally  Deep Tendon Reflexes:  Right: Upper Extremity Left: Upper extremity  biceps (C-5 to C-6) 2/4 biceps (C-5 to C-6) 2/4  tricep (C7) 2/4 triceps (C7) 2/4  Brachioradialis (C6) 2/4 Brachioradialis (C6) 2/4  Lower Extremity Lower Extremity  quadriceps (L-2 to L-4) 2/4 quadriceps (L-2 to L-4) 2/4  Achilles (S1) 1/4 Achilles (S1) 1/4  Plantars:  Right: downgoing Left: downgoing  Cerebellar:  normal finger-to-nose, normal heel-to-shin test  Gait: not tested.    Lab Results: Lab Results  Component Value Date/Time   CHOL 156 12/04/2013  6:15 AM   Lipid Panel  Recent Labs  12/04/13 0615  CHOL 156  TRIG 72  HDL 76  CHOLHDL 2.1  VLDL 14  LDLCALC 66    Studies/Results: Ct Head Wo Contrast  12/03/2013   CLINICAL DATA:  Sudden onset aphasia, stuttering and difficulty with speech yesterday  EXAM: CT HEAD  WITHOUT CONTRAST  TECHNIQUE: Contiguous axial images were obtained from the base of the skull through the vertex without intravenous contrast.  COMPARISON:  Brain MRI 12/01/2013 ; head CT 07/23/2013  FINDINGS: Negative for acute intracranial hemorrhage, acute infarction, mass, mass effect, hydrocephalus or midline shift. Gray-white differentiation is preserved throughout. Similar degree of advanced confluent periventricular white matter hypoattenuation. While nonspecific, the findings are most suggestive of longstanding chronic microvascular ischemic white matter disease. Remote infarct in the left corona radiata. No focal scalp or calvarial abnormality. Atherosclerotic calcification of the bilateral cavernous carotid arteries. Normal aeration of the mastoid air cells and visualized paranasal sinuses.  IMPRESSION: 1. No acute intracranial abnormality. 2. Stable chronic microvascular ischemic white matter disease.   Electronically Signed   By: Jacqulynn Cadet M.D.   On: 12/03/2013 10:40   Mr Brain Wo Contrast  12/03/2013   CLINICAL DATA:  Persistent slurred speech.  EXAM: MRI HEAD WITHOUT CONTRAST  TECHNIQUE: Multiplanar, multiecho pulse sequences of the brain and surrounding structures were obtained without intravenous contrast.  COMPARISON:  CT head 12/03/2013.  MRI brain 12/01/2013.  FINDINGS: The diffusion-weighted images demonstrate no evidence for acute or subacute infarction. An additional set of thin cut diffusion-weighted images were performed as well without evidence for infarct.  Extensive periventricular and subcortical white matter disease bilaterally is stable. No hemorrhage or mass lesion is present. The ventricles are proportionate to the degree of atrophy.  Remote infarcts are again noted within the left cerebellum and left corona radiata.  Flow is present in the major intracranial  arteries. The patient is status post bilateral lens extractions. The paranasal sinuses and mastoid air cells are  clear.  IMPRESSION: 1. No acute intracranial abnormality or significant interval change. Specifically, no acute infarct is evident. 2. Stable atrophy and diffuse white matter disease. 3. Remote infarcts involving the left cerebellum and corona radiata.   Electronically Signed   By: Lawrence Santiago M.D.   On: 12/03/2013 15:44    MEDICATIONS                                                                                                                        Scheduled: .  stroke: mapping our early stages of recovery book   Does not apply Once  . amLODipine  5 mg Oral Daily  . carbamazepine  200 mg Oral Daily  . carbamazepine  300 mg Oral BID  . cefTRIAXone (ROCEPHIN)  IV  1 g Intravenous Q24H  . enoxaparin  100 mg Subcutaneous QHS  . ferrous fumarate  1 tablet Oral Daily  . folic acid  1 mg Oral q morning - 10a  . gabapentin  300 mg Oral QHS  . levothyroxine  75 mcg Oral QAC breakfast  . loratadine  10 mg Oral Daily  . multivitamin  1 tablet Oral q morning - 10a  . pantoprazole  40 mg Oral Daily  . simvastatin  20 mg Oral q1800  . sodium chloride  1 g Oral BID WC  . Travoprost (BAK Free)  1 drop Both Eyes QHS  . Vitamin D (Ergocalciferol)  50,000 Units Oral Q Sat    ASSESSMENT/PLAN:                                                                                                            80 y.o. female with hypertension, CAD, atrial fibrillation, stroke with right-sided weakness, hypercholesterolemia, diabetes mellitus, DVT and pulmonary embolism (on Lovenox), migraine, herpes zoster, sleep apnea, gait disorder, memory problems and anxiety who follows up for memory problems and right post-herpetic trigeminal neuralgia, admitted to [facility] due tonew onset stuttering speech and intermittent difficulty with word finding. MRI brain reveals no acute intracranial abnormality, EEG unremarkable. Feels back to baseline. Although can not completely ruled out TIA, I am more inclined to believe that her  speech/language impairment results from a combination of UTI and cognitive dysfunction.  Dorian Pod, MD [facility] [phone]  12/04/2013, 11:49 AM

## 2013-12-04 NOTE — Progress Notes (Signed)
[facility]                                                                              Patient Demographics  [name], is a 80 y.o. female, DOB - Apr 20, 1929, EXN:[id number]  Admit date - 12/03/2013   Admitting Physician Geradine Girt, DO  Outpatient Primary MD for the patient is Vidal Schwalbe, MD  LOS - 1   Chief Complaint  Patient presents with  . Aphasia    expressive        Assessment & Plan  Expressive aphasia, Questionable TIA  -CT of the head:No acute intracranial abnormality  -MRI of the brain:No acute intracranial abnormality or significant interval change. Specifically, no acute infarct is evident.  -Neurology consulted and appreciated, did not recommend further workup including echocardiogram or EEG as patient's MRI was negative for stroke. Feels that patient's impairment was a result of combination of urinary tract infection and cognitive dysfunction.  -Patient currently back to baseline  -Hemoglobin A1c 5.8, LDL 66  -PT and OT consulted Urinary tract infection  -Per patient, she has had several of UTIs. Patient may benefit from urology outpatient followup.  -UA: Many bacteria, WBC 21-50, positive nitrites, small leukocytes  -Urine culture pending  -ceftriaxone during hospitalization   Diabetes mellitus, type 2  -Patient was placed on insulin sliding scale with CBG monitoring  -On no home medications, diet controlled  -Hemoglobin A1c 5.8   History of hyponatremia and SIADH  -Continue sodium chloride tablet   History of trigeminal neuralgia  -Continue carbamazepine   Chronic anemia  -Continue iron supplementation   Hypertension  -Continue amlodipine   Hypothyroidism  -Continue Synthroid   History of DVT  -Continue Lovenox   Procedures:  None  Consultations:  Neurology   Code Status: Full  Family Communication: None at bedside  Disposition Plan: Will d/c tomorrow.   Time Spent in minutes   30 minutes  Lab Results    Component Value Date   PLT 163 12/03/2013    Medications  Scheduled Meds: .  stroke: mapping our early stages of recovery book   Does not apply Once  . amLODipine  5 mg Oral Daily  . carbamazepine  200 mg Oral Daily  . carbamazepine  300 mg Oral BID  . enoxaparin  100 mg Subcutaneous QHS  . ferrous fumarate  1 tablet Oral Daily  . folic acid  1 mg Oral q morning - 10a  . gabapentin  300 mg Oral QHS  . levothyroxine  75 mcg Oral QAC breakfast  . loratadine  10 mg Oral Daily  . multivitamin  1 tablet Oral q morning - 10a  . pantoprazole  40 mg Oral Daily  . simvastatin  20 mg Oral q1800  . sodium chloride  1 g Oral BID WC  . Travoprost (BAK Free)  1 drop Both Eyes QHS  . Vitamin D (Ergocalciferol)  50,000 Units Oral Q Sat   Continuous Infusions:  PRN Meds:.HYDROcodone-acetaminophen  Antibiotics    Anti-infectives   Start     Dose/Rate Route Frequency Ordered Stop   12/04/13 1200  cefTRIAXone (ROCEPHIN) 1 g in dextrose 5 % 50 mL IVPB  Status:  Discontinued     1 g 100 mL/hr over 30 Minutes Intravenous Every 24 hours 12/03/13 1707 12/04/13 1259   12/03/13 1130  cefTRIAXone (ROCEPHIN) 1 g in dextrose 5 % 50 mL IVPB     1 g 100 mL/hr over 30 Minutes Intravenous  Once 12/03/13 1128 12/03/13 1237        Subjective:   [name] seen and examined today.  Patient has no complaints and feels her speech has improved.   Objective:   Filed Vitals:   12/04/13 0400 12/04/13 0537 12/04/13 0600 12/04/13 1524  BP: 115/89  123/52 153/60  Pulse: 62  73 81  Temp:  97.7 F (36.5 C)  97.8 F (36.6 C)  TempSrc:  Oral  Oral  Resp:      Height:      Weight:      SpO2: 97%  95% 99%    Wt Readings from Last 3 Encounters:  12/03/13 96.616 kg (213 lb)  12/03/13 96.888 kg (213 lb 9.6 oz)  11/15/13 97.07 kg (214 lb)     Intake/Output Summary (Last 24 hours) at 12/04/13 1540 Last data filed at 12/04/13 1238  Gross per 24 hour  Intake   1680 ml  Output      0 ml  Net   1680  ml    Exam General: Well developed, well nourished, NAD, appears stated age  HEENT: NCAT, PERRLA, EOMI, Anicteic Sclera, mucous membranes moist.  Cardiovascular: S1 S2 auscultated, no rubs, murmurs or gallops. Regular rate and rhythm.  Respiratory: Clear to auscultation bilaterally with equal chest rise  Abdomen: Soft, obese, nontender, nondistended, + bowel sounds  Extremities: warm dry without cyanosis clubbing or edema  Neuro: AAOx3, cranial nerves grossly intact. Strength 5/5 in patient's upper and lower extremities bilaterally  Skin: Without rashes exudates or nodules  Psych: Normal affect and demeanor with intact judgement and insight  Data Review   Micro Results No results found for this or any previous visit (from the past 240 hour(s)).  Radiology Reports Ct Head Wo Contrast  12/03/2013   CLINICAL DATA:  Sudden onset aphasia, stuttering and difficulty with speech yesterday  EXAM: CT HEAD WITHOUT CONTRAST  TECHNIQUE: Contiguous axial images were obtained from the base of the skull through the vertex without intravenous contrast.  COMPARISON:  Brain MRI 12/01/2013 ; head CT 07/23/2013  FINDINGS: Negative for acute intracranial hemorrhage, acute infarction, mass, mass effect, hydrocephalus or midline shift. Gray-white differentiation is preserved throughout. Similar degree of advanced confluent periventricular white matter hypoattenuation. While nonspecific, the findings are most suggestive of longstanding chronic microvascular ischemic white matter disease. Remote infarct in the left corona radiata. No focal scalp or calvarial abnormality. Atherosclerotic calcification of the bilateral cavernous carotid arteries. Normal aeration of the mastoid air cells and visualized paranasal sinuses.  IMPRESSION: 1. No acute intracranial abnormality. 2. Stable chronic microvascular ischemic white matter disease.   Electronically Signed   By: Jacqulynn Cadet M.D.   On: 12/03/2013 10:40   Mr Brain Wo  Contrast  12/03/2013   CLINICAL DATA:  Persistent slurred speech.  EXAM: MRI HEAD WITHOUT CONTRAST  TECHNIQUE: Multiplanar, multiecho pulse sequences of the brain and surrounding structures were obtained without intravenous contrast.  COMPARISON:  CT head 12/03/2013.  MRI brain 12/01/2013.  FINDINGS: The diffusion-weighted images demonstrate no evidence for acute or subacute infarction. An additional set of thin cut diffusion-weighted images were performed as well without evidence for infarct.  Extensive periventricular and subcortical white matter disease bilaterally  is stable. No hemorrhage or mass lesion is present. The ventricles are proportionate to the degree of atrophy.  Remote infarcts are again noted within the left cerebellum and left corona radiata.  Flow is present in the major intracranial arteries. The patient is status post bilateral lens extractions. The paranasal sinuses and mastoid air cells are clear.  IMPRESSION: 1. No acute intracranial abnormality or significant interval change. Specifically, no acute infarct is evident. 2. Stable atrophy and diffuse white matter disease. 3. Remote infarcts involving the left cerebellum and corona radiata.   Electronically Signed   By: Lawrence Santiago M.D.   On: 12/03/2013 15:44   Mr Brain Wo Contrast  12/01/2013   CLINICAL DATA:  Cognitive issues.  Memory loss.  EXAM: MRI HEAD WITHOUT CONTRAST  TECHNIQUE: Multiplanar, multiecho pulse sequences of the brain and surrounding structures were obtained without intravenous contrast.  COMPARISON:  10/30/2012.  09/23/2008.  FINDINGS: Diffusion imaging does not show any acute or subacute infarction. The brainstem is normal. There are old small vessel cerebellar infarctions, more on the left. The cerebral hemispheres show old infarction in the left basal ganglia and extensive chronic small vessel ischemic changes throughout the deep and subcortical white matter. No large vessel territory infarction. There are scattered  foci of hemosiderin deposition related to old small vessel infarctions. No evidence of previous large hemorrhage. No mass lesion, hydrocephalus or extra-axial collection. No pituitary mass. No inflammatory sinus disease. No skull or skullbase lesion. Major vessels at the base of the brain show flow.  IMPRESSION: No acute or reversible finding. Extensive chronic small vessel ischemic changes throughout the brain as outlined above.   Electronically Signed   By: Nelson Chimes M.D.   On: 12/01/2013 13:27    CBC  Recent Labs Lab 12/03/13 0953  WBC 5.1  HGB 10.0*  HCT 29.6*  PLT 163  MCV 82.5  MCH 27.9  MCHC 33.8  RDW 16.8*  LYMPHSABS 1.0  MONOABS 0.8  EOSABS 0.2  BASOSABS 0.1    Chemistries   Recent Labs Lab 12/03/13 0953  NA 134*  K 5.1  CL 98  CO2 23  GLUCOSE 86  BUN 19  CREATININE 1.19*  CALCIUM 9.0   ------------------------------------------------------------------------------------------------------------------ estimated creatinine clearance is 41.9 ml/min (by C-G formula based on Cr of 1.19). ------------------------------------------------------------------------------------------------------------------  Recent Labs  12/03/13 1849  HGBA1C 5.8*   ------------------------------------------------------------------------------------------------------------------  Recent Labs  12/04/13 0615  CHOL 156  HDL 76  LDLCALC 66  TRIG 72  CHOLHDL 2.1   ------------------------------------------------------------------------------------------------------------------ No results found for this basename: TSH, T4TOTAL, FREET3, T3FREE, THYROIDAB,  in the last 72 hours ------------------------------------------------------------------------------------------------------------------ No results found for this basename: VITAMINB12, FOLATE, FERRITIN, TIBC, IRON, RETICCTPCT,  in the last 72 hours  Coagulation profile No results found for this basename: INR, PROTIME,  in the  last 168 hours  No results found for this basename: DDIMER,  in the last 72 hours  Cardiac Enzymes No results found for this basename: CK, CKMB, TROPONINI, MYOGLOBIN,  in the last 168 hours ------------------------------------------------------------------------------------------------------------------ No components found with this basename: POCBNP,     Jhoana Upham D.O. on 12/04/2013 at 3:40 PM  Between 7am to 7pm - Pager - [phone]  After 7pm go to [email or web address] - password [email or web address]  And look for the night coverage person covering for me after hours  [facility] Office  [phone]

## 2013-12-04 NOTE — Evaluation (Signed)
Physical Therapy Evaluation Patient Details Name: [name] MRN: [id number] DOB: [birth year] Today's Date: 12/04/2013   History of Present Illness  80 y.o. female with hypertension, CAD, atrial fibrillation, stroke with right-sided weakness, hypercholesterolemia, diabetes mellitus, DVT and pulmonary embolism (on Lovenox), migraine, herpes zoster, sleep apnea, gait disorder, memory problems and anxiety who follows up for memory problems and right post-herpetic trigeminal neuralgia, admitted to [facility] due tonew onset stuttering speech and intermittent difficulty with word finding. Neuro work-up underway, possible TIA, vs UTI.   Clinical Impression  Patient with some modest deficits in mobility, believe that this is baseline for patient. Ambulating and performing various self care tasks and dynamic mobility without physical assist. Anticipate patient will be safe for dc home with supervision initially to ensure safety. No further acute PT needs, will sign off. Patient in agreement. Encouraged continued mobility with staff and educated patient on signs and symptoms of stroke.    Follow Up Recommendations Supervision/Assistance - 24 hour (initially (states son is coming to provide))    Equipment Recommendations  None recommended by PT    Recommendations for Other Services       Precautions / Restrictions Precautions Precautions: Fall      Mobility  Bed Mobility Overal bed mobility: Modified Independent             General bed mobility comments: increased time to perform with HOB elevated  Transfers Overall transfer level: Needs assistance Equipment used: Rolling walker (2 wheeled) Transfers: Sit to/from Stand Sit to Stand: Supervision         General transfer comment: Patient able to get up from bed, chair and toilet without physical assist  Ambulation/Gait Ambulation/Gait assistance: Supervision Ambulation Distance (Feet): 240 Feet Assistive device: Rolling walker (2  wheeled) Gait Pattern/deviations: Step-through pattern;Decreased stride length;Trunk flexed Gait velocity: decreased Gait velocity interpretation: Below normal speed for age/gender    Stairs            Wheelchair Mobility    Modified Rankin (Stroke Patients Only)       Balance Overall balance assessment: Modified Independent (performed self care several dynamic balance activities Mod I)                                           Pertinent Vitals/Pain      Home Living Family/patient expects to be discharged to:: Private residence Living Arrangements: Alone Available Help at Discharge: Family Type of Home: House Home Access: Level entry     Home Layout: One level Home Equipment: Environmental consultant - 2 wheels;Walker - 4 wheels      Prior Function Level of Independence: Independent with assistive device(s)               Hand Dominance   Dominant Hand: Right    Extremity/Trunk Assessment   Upper Extremity Assessment: Generalized weakness           Lower Extremity Assessment: Generalized weakness      Cervical / Trunk Assessment: Kyphotic  Communication   Communication:  (wears glassess all the time)  Cognition Arousal/Alertness: Awake/alert Behavior During Therapy: WFL for tasks assessed/performed Overall Cognitive Status: Within Functional Limits for tasks assessed                      General Comments General comments (skin integrity, edema, etc.): itching reported bilateral feet with hospital socks  Exercises        Assessment/Plan    PT Assessment Patent does not need any further PT services  PT Diagnosis     PT Problem List    PT Treatment Interventions     PT Goals (Current goals can be found in the Care Plan section) Acute Rehab PT Goals PT Goal Formulation: No goals set, d/c therapy    Frequency     Barriers to discharge        Co-evaluation               End of Session Equipment Utilized  During Treatment: Gait belt Activity Tolerance: Patient tolerated treatment well Patient left: in bed;with call bell/phone within reach;with bed alarm set Nurse Communication: Mobility status         Time: 1413-1440 PT Time Calculation (min): 27 min   Charges:   PT Evaluation $Initial PT Evaluation Tier I: 1 Procedure PT Treatments $Gait Training: 8-22 mins $Self Care/Home Management: 8-22   PT G CodesDuncan Dull 12/04/2013, 2:43 PM Alben Deeds, [place] DPT  [phone]

## 2013-12-04 NOTE — Discharge Summary (Signed)
Physician Discharge Summary  [name] VVO:[id number] DOB: 04/21/1929 DOA: 12/03/2013  PCP: Vidal Schwalbe, MD  Admit date: 12/03/2013 Discharge date: 12/05/2013  Time spent: 35 minutes  Recommendations for Outpatient Follow-up:  Patient will be discharged to home. She is to follow up with her primary care physician within one week of discharge. Patient to continue doing her medications as prescribed. She should follow a heart healthy carb modified diet. Patient to resume normal physical activity as tolerated.  Discharge Diagnoses:  Expressive aphasia, questionable TIA Urinary tract infection Diabetes mellitus, type II History of hyponatremia and as I 88 History of trigeminal neuralgia Chronic anemia Hypertension Hypothyroidism History of DVT  Discharge Condition: Stable  Diet recommendation: Heart healthy soft modified  Filed Weights   12/03/13 0900  Weight: 96.616 kg (213 lb)    History of present illness:  On 12/03/2013 [name] is an 80 y.o. female hypertension, CAD, atrial fibrillation, stroke with right-sided weakness, hypercholesterolemia, diabetes mellitus, DVT and pulmonary embolism (on Lovenox), migraine, herpes zoster, sleep apnea, gait disorder, memory problems and anxiety who follows up for memory problems and right post-herpetic trigeminal neuralgia. Patient obtained a MRI brain on 12-01-2013 for evaluation of cognitive decline. This showed extensive small vessel ischemic changes throughout the deep and subcortical white matter, including remote small infarcts involving the cerebellum and left basal ganglia. No acute infarct noted. She noted she was generally lethargic after the MRI. She woke up yesterday noting stuttering speech and difficulty getting her words out. She stated when she sings she has no speech difficulty. Due to continued symptoms she was sent to ED. She has had periods of clear speech and periods where she stutters when trying to start a  sentence. She denied weakness, numbness, tingling, blurred or double vision, lack of vision.   Hospital Course:  Expressive aphasia,  Questionable TIA -CT of the head:No acute intracranial abnormality -MRI of the brain:No acute intracranial abnormality or significant interval change. Specifically, no acute infarct is evident. -Neurology consulted and appreciated, did not recommend further workup including echocardiogram or EEG as patient's MRI was negative for stroke.  Feels that patient's impairment was a result of combination of urinary tract infection and cognitive dysfunction. -Patient currently back to baseline -Hemoglobin A1c 5.8, LDL 66 -PT and OT consulted  Urinary tract infection -Per patient, she has had several of UTIs.  Patient may benefit from urology outpatient followup. -UA: Many bacteria, WBC 21-50, positive nitrites, small leukocytes -Urine culture pending -Was placed on ceftriaxone during hospitalization -Will be discharged on fosfomycin (due to patient's multiple drug allergies)  Diabetes mellitus, type 2 -Patient was placed on insulin sliding scale with CBG monitoring -On no home medications, diet controlled -Hemoglobin A1c 5.8  History of hyponatremia and SIADH -Continue sodium chloride tablet  History of trigeminal neuralgia -Continue carbamazepine  Chronic anemia -Continue iron supplementation  Hypertension -Continue amlodipine  Hypothyroidism -Continue Synthroid  History of DVT -Continue Lovenox  Procedures: None  Consultations: Neurology  Discharge Exam: Filed Vitals:   12/05/13 1022  BP: 124/57  Pulse: 65  Temp: 97.8 F (36.6 C)  Resp: 20     General: Well developed, well nourished, NAD, appears stated age  HEENT: NCAT, PERRLA, EOMI, Anicteic Sclera, mucous membranes moist.  Cardiovascular: S1 S2 auscultated, no rubs, murmurs or gallops. Regular rate and rhythm.  Respiratory: Clear to auscultation bilaterally with equal chest  rise  Abdomen: Soft, obese, nontender, nondistended, + bowel sounds  Extremities: warm dry without cyanosis clubbing or edema  Neuro: AAOx3, cranial nerves grossly intact. Strength 5/5 in patient's upper and lower extremities bilaterally  Skin: Without rashes exudates or nodules  Psych: Normal affect and demeanor with intact judgement and insight  Discharge Instructions      Discharge Instructions   Discharge instructions    Complete by:  As directed   Patient will be discharged to home. She is to follow up with her primary care physician within one week of discharge. Patient to continue doing her medications as prescribed. She should follow a heart healthy carb modified diet. Patient to resume normal physical activity as tolerated.            Medication List         acetaminophen-codeine 300-30 MG per tablet  Commonly known as:  TYLENOL #3  Take 1 tablet by mouth every 6 (six) hours as needed for moderate pain.     ACIPHEX 20 MG tablet  Generic drug:  RABEprazole  Take 20 mg by mouth every morning.     amLODipine 5 MG tablet  Commonly known as:  NORVASC  Take 1 tablet (5 mg total) by mouth daily.     CARBATROL 100 MG 12 hr capsule  Generic drug:  carbamazepine  Take 200-300 mg by mouth 3 (three) times daily. Takes 300mg  in am, 200mg  at noon, 300mg  at bedtime (all after meals)     EPIPEN IJ  Inject as directed once as needed. For allergic reactions     folic acid 1 MG tablet  Commonly known as:  FOLVITE  Take 1 mg by mouth every morning.     fosfomycin 3 G Pack  Commonly known as:  MONUROL  Take 3 g by mouth once.  Start taking on:  12/07/2013     Gabapentin (PHN) 300 MG Tabs  Take 300 mg by mouth at bedtime.     HEMOCYTE PLUS 106-1 MG Caps  Take 1 capsule by mouth daily.     HYDROcodone-acetaminophen 5-325 MG per tablet  Commonly known as:  NORCO/VICODIN  Take 1 tablet by mouth every 6 (six) hours as needed for moderate pain.     levothyroxine 75 MCG  tablet  Commonly known as:  SYNTHROID, LEVOTHROID  Take 75 mcg by mouth daily before breakfast.     loratadine 10 MG tablet  Commonly known as:  CLARITIN  Take 10 mg by mouth daily.     LOVENOX 100 MG/ML injection  Generic drug:  enoxaparin  Inject 100 mg into the skin at bedtime.     multivitamin tablet  Take 1 tablet by mouth every morning.     PRAVACHOL 40 MG tablet  Generic drug:  pravastatin  Take 40 mg by mouth every morning.     sodium chloride 1 G tablet  Take 1 tablet (1 g total) by mouth 2 (two) times daily with a meal.     Travoprost (BAK Free) 0.004 % Soln ophthalmic solution  Commonly known as:  TRAVATAN  Place 1 drop into both eyes at bedtime.     Vitamin D (Ergocalciferol) 50000 UNITS Caps capsule  Commonly known as:  DRISDOL  Take 50,000 Units by mouth every Saturday.       Allergies  Allergen Reactions  . Benadryl [Diphenhydramine Hcl] Hives and Shortness Of Breath  . Eye Drops [Tetrahydrazoline Hcl] Other (See Comments)    Burning, itching, swelling  . Flu Virus Vaccine Shortness Of Breath  . Iohexol Hives and Shortness Of Breath     Code: HIVES,  Desc: PT IS ALLERGIC TO IVP DYE/IODINE/SHRIMP.  CAUSES SOB AND HIVES PER PT.  STEPHANIE, RT-R, Onset Date: [id number]   . Shrimp [Shellfish Allergy] Anaphylaxis  . Voltaren [Diclofenac Sodium] Other (See Comments)    Acid reflux symptoms  . Ambien [Zolpidem Tartrate] Hives  . Gluten Meal Diarrhea  . Travatan Z [Travoprost] Other (See Comments)    BRAND NAME ONLY OLCANNOT TOLERATE GENERIC FORM OF EYE DROPS, IT CONTAINS PRESERVATIVES-SENSITIVITY  . Ativan [Lorazepam]     delusional  . Hydrocodone     Only on the higher dosages  . Betadine Antibiotic-Moisturize [Bacitracin-Polymyxin B] Rash  . Biaxin [Clarithromycin] Hives  . Celebrex [Celecoxib] Rash  . Ciprofloxacin Hcl Rash       . Combigan [Brimonidine Tartrate-Timolol] Other (See Comments)    unknown  . Coumadin [Warfarin Sodium] Rash  . Ivp Dye  [Iodinated Diagnostic Agents] Hives  . Levaquin [Levofloxacin In D5w] Rash  . Naprosyn [Naproxen] Rash  . Pataday [Olopatadine Hcl] Other (See Comments)  . Penicillins Hives  . Plavix [Clopidogrel Bisulfate] Rash  . Septra [Sulfamethoxazole-Tmp Ds] Hives   Follow-up Information   Follow up with WHITE,CYNTHIA S, MD. Schedule an appointment as soon as possible for a visit in 1 week. [facility] followup)    Specialty:  Family Medicine   Contact information:   [address] [place] [address] [phone]        The results of significant diagnostics from this hospitalization (including imaging, microbiology, ancillary and laboratory) are listed below for reference.    Significant Diagnostic Studies: Ct Head Wo Contrast  12/03/2013   CLINICAL DATA:  Sudden onset aphasia, stuttering and difficulty with speech yesterday  EXAM: CT HEAD WITHOUT CONTRAST  TECHNIQUE: Contiguous axial images were obtained from the base of the skull through the vertex without intravenous contrast.  COMPARISON:  Brain MRI 12/01/2013 ; head CT 07/23/2013  FINDINGS: Negative for acute intracranial hemorrhage, acute infarction, mass, mass effect, hydrocephalus or midline shift. Gray-white differentiation is preserved throughout. Similar degree of advanced confluent periventricular white matter hypoattenuation. While nonspecific, the findings are most suggestive of longstanding chronic microvascular ischemic white matter disease. Remote infarct in the left corona radiata. No focal scalp or calvarial abnormality. Atherosclerotic calcification of the bilateral cavernous carotid arteries. Normal aeration of the mastoid air cells and visualized paranasal sinuses.  IMPRESSION: 1. No acute intracranial abnormality. 2. Stable chronic microvascular ischemic white matter disease.   Electronically Signed   By: Jacqulynn Cadet M.D.   On: 12/03/2013 10:40   Mr Brain Wo Contrast  12/03/2013   CLINICAL DATA:   Persistent slurred speech.  EXAM: MRI HEAD WITHOUT CONTRAST  TECHNIQUE: Multiplanar, multiecho pulse sequences of the brain and surrounding structures were obtained without intravenous contrast.  COMPARISON:  CT head 12/03/2013.  MRI brain 12/01/2013.  FINDINGS: The diffusion-weighted images demonstrate no evidence for acute or subacute infarction. An additional set of thin cut diffusion-weighted images were performed as well without evidence for infarct.  Extensive periventricular and subcortical white matter disease bilaterally is stable. No hemorrhage or mass lesion is present. The ventricles are proportionate to the degree of atrophy.  Remote infarcts are again noted within the left cerebellum and left corona radiata.  Flow is present in the major intracranial arteries. The patient is status post bilateral lens extractions. The paranasal sinuses and mastoid air cells are clear.  IMPRESSION: 1. No acute intracranial abnormality or significant interval change. Specifically, no acute infarct is evident. 2. Stable atrophy and diffuse  white matter disease. 3. Remote infarcts involving the left cerebellum and corona radiata.   Electronically Signed   By: Lawrence Santiago M.D.   On: 12/03/2013 15:44   Mr Brain Wo Contrast  12/01/2013   CLINICAL DATA:  Cognitive issues.  Memory loss.  EXAM: MRI HEAD WITHOUT CONTRAST  TECHNIQUE: Multiplanar, multiecho pulse sequences of the brain and surrounding structures were obtained without intravenous contrast.  COMPARISON:  10/30/2012.  09/23/2008.  FINDINGS: Diffusion imaging does not show any acute or subacute infarction. The brainstem is normal. There are old small vessel cerebellar infarctions, more on the left. The cerebral hemispheres show old infarction in the left basal ganglia and extensive chronic small vessel ischemic changes throughout the deep and subcortical white matter. No large vessel territory infarction. There are scattered foci of hemosiderin deposition related  to old small vessel infarctions. No evidence of previous large hemorrhage. No mass lesion, hydrocephalus or extra-axial collection. No pituitary mass. No inflammatory sinus disease. No skull or skullbase lesion. Major vessels at the base of the brain show flow.  IMPRESSION: No acute or reversible finding. Extensive chronic small vessel ischemic changes throughout the brain as outlined above.   Electronically Signed   By: Nelson Chimes M.D.   On: 12/01/2013 13:27    Microbiology: Recent Results (from the past 240 hour(s))  URINE CULTURE     Status: None   Collection Time    12/03/13 11:45 AM      Result Value Ref Range Status   Specimen Description URINE, CLEAN CATCH   Final   Special Requests NONE   Final   Culture  Setup Time     Final   Value: 12/03/2013 17:15     Performed at [facility]     Final   Value: >=100,000 COLONIES/ML     Performed at [facility]   Culture     Final   Value: ESCHERICHIA COLI     Performed at [facility]   Report Status PENDING   Incomplete     Labs: Basic Metabolic Panel:  Recent Labs Lab 12/03/13 0953  NA 134*  K 5.1  CL 98  CO2 23  GLUCOSE 86  BUN 19  CREATININE 1.19*  CALCIUM 9.0   Liver Function Tests: No results found for this basename: AST, ALT, ALKPHOS, BILITOT, PROT, ALBUMIN,  in the last 168 hours No results found for this basename: LIPASE, AMYLASE,  in the last 168 hours No results found for this basename: AMMONIA,  in the last 168 hours CBC:  Recent Labs Lab 12/03/13 0953  WBC 5.1  NEUTROABS 3.1  HGB 10.0*  HCT 29.6*  MCV 82.5  PLT 163   Cardiac Enzymes: No results found for this basename: CKTOTAL, CKMB, CKMBINDEX, TROPONINI,  in the last 168 hours BNP: BNP (last 3 results) No results found for this basename: PROBNP,  in the last 8760 hours CBG: No results found for this basename: GLUCAP,  in the last 168 hours     Signed:  Cristal Ford  [facility] 12/05/2013, 10:43 AM

## 2013-12-05 DIAGNOSIS — I10 Essential (primary) hypertension: Secondary | ICD-10-CM | POA: Diagnosis not present

## 2013-12-05 DIAGNOSIS — E236 Other disorders of pituitary gland: Secondary | ICD-10-CM

## 2013-12-05 DIAGNOSIS — B0222 Postherpetic trigeminal neuralgia: Secondary | ICD-10-CM

## 2013-12-05 DIAGNOSIS — G458 Other transient cerebral ischemic attacks and related syndromes: Secondary | ICD-10-CM | POA: Diagnosis not present

## 2013-12-05 DIAGNOSIS — E119 Type 2 diabetes mellitus without complications: Secondary | ICD-10-CM | POA: Diagnosis not present

## 2013-12-05 DIAGNOSIS — R4789 Other speech disturbances: Secondary | ICD-10-CM | POA: Diagnosis not present

## 2013-12-05 DIAGNOSIS — Z7901 Long term (current) use of anticoagulants: Secondary | ICD-10-CM | POA: Diagnosis not present

## 2013-12-05 DIAGNOSIS — D539 Nutritional anemia, unspecified: Secondary | ICD-10-CM

## 2013-12-05 NOTE — Progress Notes (Signed)
Utilization review completed.  

## 2013-12-05 NOTE — Discharge Instructions (Signed)
Aphasia Aphasia is a neurological disorder caused by damage to the parts of the brain that control language. CAUSES  Aphasia is not a disease, but a symptom of brain damage. Aphasia is commonly seen in adults who have suffered a stroke. Aphasia also can result from:  A brain tumor.  Infection.  Head injury.  A rare type of dementia called Primary Progressive Aphasia. Common types of dementia may be associated with aphasia but can also exist without language problems. SYMPTOMS  Primary signs of the disorder include:  Problems expressing oneself when speaking.  Trouble understanding speech.  Difficulty with reading and writing.  Speaking in short or incomplete sentences.  Speaking in sentences that don't make sense.  Speaking unrecognizable words.  Interpreting figurative language literally.  Writing sentences that don't make sense. The type and severity of language problems depend on the precise location and extent of the damaged brain tissue. Aphasia can be divided into four broad categories:  Expressive aphasia - difficulty in conveying thoughts through speech or writing. The patient knows what they want to say, but cannot find the words they need.  Receptive aphasia - difficulty understanding spoken or written language. The patient hears the voice or sees the print but cannot make sense of the words.  Anomic or amnesia aphasia - difficulty in using the correct names for particular objects, people, places, or events. This is the least severe form of aphasia.  Global aphasia results from severe and extensive damage to the language areas of the brain. Patients lose almost all language function, both comprehension (understanding) and expression. They cannot speak, understand speech, read, or write. TREATMENT  Sometimes an individual will completely recover from aphasia without treatment. In most cases, language therapy should begin as soon as possible. Language therapy should  be tailored to the individual needs of the patient. Therapy with a speech pathologist involves exercises in which patients:  Read.  Write.  Follow directions.  Repeat what they hear.  Computer-aided therapy may also be used. PROGNOSIS  The outcome of aphasia is difficult to predict. People who are younger or have less extensive brain damage do better. The location of the injury is also important. The location is a clue to prognosis. In general, patients tend to recover skills in language comprehension (understanding) more completely than those skills involving expression (speaking or writing). Document Released: 12/01/2001 Document Revised: 06/03/2011 Document Reviewed: 05/31/2013 [facility] Patient Information 2015 [place], [place]. This information is not intended to replace advice given to you by your health care provider. Make sure you discuss any questions you have with your health care provider. Urinary Tract Infection Urinary tract infections (UTIs) can develop anywhere along your urinary tract. Your urinary tract is your body's drainage system for removing wastes and extra water. Your urinary tract includes two kidneys, two ureters, a bladder, and a urethra. Your kidneys are a pair of bean-shaped organs. Each kidney is about the size of your fist. They are located below your ribs, one on each side of your spine. CAUSES Infections are caused by microbes, which are microscopic organisms, including fungi, viruses, and bacteria. These organisms are so small that they can only be seen through a microscope. Bacteria are the microbes that most commonly cause UTIs. SYMPTOMS  Symptoms of UTIs may vary by age and gender of the patient and by the location of the infection. Symptoms in young women typically include a frequent and intense urge to urinate and a painful, burning feeling in the bladder or urethra during  urination. Older women and men are more likely to be tired, shaky, and weak and have  muscle aches and abdominal pain. A fever may mean the infection is in your kidneys. Other symptoms of a kidney infection include pain in your back or sides below the ribs, nausea, and vomiting. DIAGNOSIS To diagnose a UTI, your caregiver will ask you about your symptoms. Your caregiver also will ask to provide a urine sample. The urine sample will be tested for bacteria and white blood cells. White blood cells are made by your body to help fight infection. TREATMENT  Typically, UTIs can be treated with medication. Because most UTIs are caused by a bacterial infection, they usually can be treated with the use of antibiotics. The choice of antibiotic and length of treatment depend on your symptoms and the type of bacteria causing your infection. HOME CARE INSTRUCTIONS  If you were prescribed antibiotics, take them exactly as your caregiver instructs you. Finish the medication even if you feel better after you have only taken some of the medication.  Drink enough water and fluids to keep your urine clear or pale yellow.  Avoid caffeine, tea, and carbonated beverages. They tend to irritate your bladder.  Empty your bladder often. Avoid holding urine for long periods of time.  Empty your bladder before and after sexual intercourse.  After a bowel movement, women should cleanse from front to back. Use each tissue only once. SEEK MEDICAL CARE IF:   You have back pain.  You develop a fever.  Your symptoms do not begin to resolve within 3 days. SEEK IMMEDIATE MEDICAL CARE IF:   You have severe back pain or lower abdominal pain.  You develop chills.  You have nausea or vomiting.  You have continued burning or discomfort with urination. MAKE SURE YOU:   Understand these instructions.  Will watch your condition.  Will get help right away if you are not doing well or get worse. Document Released: 12/19/2004 Document Revised: 09/10/2011 Document Reviewed: 04/19/2011 [facility] Patient  Information 2015 [place], [place]. This information is not intended to replace advice given to you by your health care provider. Make sure you discuss any questions you have with your health care provider.

## 2013-12-06 LAB — URINE CULTURE: Colony Count: >=100000

## 2013-12-15 DIAGNOSIS — R300 Dysuria: Secondary | ICD-10-CM | POA: Diagnosis not present

## 2013-12-15 DIAGNOSIS — N390 Urinary tract infection, site not specified: Secondary | ICD-10-CM | POA: Diagnosis not present

## 2013-12-15 DIAGNOSIS — M1710 Unilateral primary osteoarthritis, unspecified knee: Secondary | ICD-10-CM | POA: Diagnosis not present

## 2013-12-15 DIAGNOSIS — F411 Generalized anxiety disorder: Secondary | ICD-10-CM | POA: Diagnosis not present

## 2013-12-15 DIAGNOSIS — G479 Sleep disorder, unspecified: Secondary | ICD-10-CM | POA: Diagnosis not present

## 2013-12-22 DIAGNOSIS — M1710 Unilateral primary osteoarthritis, unspecified knee: Secondary | ICD-10-CM | POA: Diagnosis not present

## 2013-12-29 DIAGNOSIS — M1711 Unilateral primary osteoarthritis, right knee: Secondary | ICD-10-CM | POA: Diagnosis not present

## 2014-01-03 ENCOUNTER — Ambulatory Visit (INDEPENDENT_AMBULATORY_CARE_PROVIDER_SITE_OTHER): Payer: Medicare Other | Admitting: Podiatry

## 2014-01-03 DIAGNOSIS — B351 Tinea unguium: Secondary | ICD-10-CM

## 2014-01-03 DIAGNOSIS — G479 Sleep disorder, unspecified: Secondary | ICD-10-CM | POA: Diagnosis not present

## 2014-01-03 DIAGNOSIS — Q828 Other specified congenital malformations of skin: Secondary | ICD-10-CM

## 2014-01-03 DIAGNOSIS — M79676 Pain in unspecified toe(s): Secondary | ICD-10-CM

## 2014-01-03 DIAGNOSIS — F419 Anxiety disorder, unspecified: Secondary | ICD-10-CM | POA: Diagnosis not present

## 2014-01-03 DIAGNOSIS — R300 Dysuria: Secondary | ICD-10-CM | POA: Diagnosis not present

## 2014-01-03 DIAGNOSIS — N390 Urinary tract infection, site not specified: Secondary | ICD-10-CM | POA: Diagnosis not present

## 2014-01-03 NOTE — Progress Notes (Signed)
   Subjective:    Patient ID: [name], female    DOB: 1929-05-19, 80 y.o.   MRN: [id number]  HPI  This patient presents for ongoing debridement of painful toenails and keratoses  Review of Systems  All other systems reviewed and are negative.      Objective:   Physical Exam  Orientated x3  The toenails are elongated, hypertrophic, discolored 6-10 Nucleated plantar keratoses right first MPJ       Assessment & Plan:   Assessment: Symptomatic onychomycoses 6-10 Porokeratosis x1  Plan: Nails x10 and keratoses x1 debrided without a bleeding  Reappoint x3 months

## 2014-01-06 DIAGNOSIS — N390 Urinary tract infection, site not specified: Secondary | ICD-10-CM | POA: Diagnosis not present

## 2014-01-06 DIAGNOSIS — R479 Unspecified speech disturbances: Secondary | ICD-10-CM | POA: Diagnosis not present

## 2014-01-12 ENCOUNTER — Ambulatory Visit (INDEPENDENT_AMBULATORY_CARE_PROVIDER_SITE_OTHER): Payer: Medicare Other | Admitting: Neurology

## 2014-01-12 ENCOUNTER — Encounter: Payer: Self-pay | Admitting: Neurology

## 2014-01-12 VITALS — BP 106/60 | HR 100 | Resp 18 | Wt 206.0 lb

## 2014-01-12 DIAGNOSIS — R413 Other amnesia: Secondary | ICD-10-CM | POA: Diagnosis not present

## 2014-01-12 DIAGNOSIS — G501 Atypical facial pain: Secondary | ICD-10-CM | POA: Diagnosis not present

## 2014-01-12 DIAGNOSIS — I639 Cerebral infarction, unspecified: Secondary | ICD-10-CM

## 2014-01-12 NOTE — Patient Instructions (Signed)
Stop the gabapentin Repeat in 2 months.

## 2014-01-14 ENCOUNTER — Encounter: Payer: Self-pay | Admitting: Neurology

## 2014-01-14 NOTE — Progress Notes (Signed)
NEUROLOGY FOLLOW UP OFFICE NOTE  [name] [id number]  HISTORY OF PRESENT ILLNESS: [name] is an 80 year old right-handed woman with history of hypertension, CAD, atrial fibrillation, stroke with right-sided weakness, hypercholesterolemia, diabetes mellitus, DVT and pulmonary embolism (on Lovenox), migraine, herpes zoster, sleep apnea, gait disorder, memory problems and anxiety who follows up for memory problems and right post-herpetic trigeminal neuralgia.  She is accompanied by her friend.  UPDATE: When I saw her last month, she had difficulty getting words out since the prior day.  She was sent to the ED for further evaluation and she was admitted for stroke workup.  MRI of the brain showed stable atrophy and white matter disease with remote left basal ganglia lacunar infarct but was negative for acute stroke.  She did have another UTI, which was thought to be the cause of the speech disturbance.  There has been no change in memory since last visit.  Pain has been under control.  I  ATYPICAL RIGHT FACIAL PAIN: History: Onset of symptoms started in 2006, following episode of herpes zoster involving she describes sharp pain involving the the right V1 and V2 distribution, associated with allodynia. Chewing, talking, and brushing her teeth triggers and exacerbates the pain.  She was subsequently treated for postherpetic neuralgia.  Hasn't had an attack since August 2014.  Hgb A1c was 5.8 and LDL was 66.  It was questionable whether the expressive aphasia was actually a TIA, as she sometimes had periods of clear speech.  She was discharged without change in medication.  Current medication:  Carbatrol 300mg /200mg /300mg , Gabapentin 300mg  at bedtime.    Past medications:   carbamazepine generic which led to breakthrough pain.    09/23/08 MRI BRAIN WO: Small vessel disease, remote infarcts left caudate and left cerebellum 09/23/08 MRA/MRV HEAD:  Unremarkable.  07/23/13 CT Head:  Small vessel  ischemic changes with old strokes in the left cerebellar hemisphere and body of caudate, stable from prior studies.  LABS: 04/22/13 carbamazepine level 7 12/03/13 WBC 5.1, HGB 10, HCT 29.6, PLT 163, MCV 82.5, Na 134, K 5.1, Cl 98, CO2 23, glucose 86, BUN 19, Cr 1.19, Ca 9, Hgb A1c 5.8  II.  COGNITIVE DEFICITS: History: This started about a year ago after initiation of gabapentin. It was fairly sudden onset and has not progressed.  She tends to repeat herself often, such as telling stories. She repeats questions often. She reports word-finding difficulties as well.  She has no difficulty typically with performing ADLs. She typically lives by herself in her one story house. She manages her own finances. She doesn't drive 2 to remote history of blackout spells. She typically does not have problems recognizing people or recalling names. She denies any depression. She denies any hallucinations. She does have history of vivid nightmares. Earlier this month, she fell bruising her hip and fracturing her arm. In June 2015, she was started on Vicodin and then developed more cognitive clouding. As symptoms began after initiation of gabapentin, and became worse after starting Vicodin, she was asked to return for re-evaluation after tapering off these medications.  She only takes the Vicodin occasionally now.  B12 level from 11/15/13 was 703.  There has been no improvement in memory.  She reports a maternal aunt with history of dementia. To evaluate cognitive problems, an MRI of the brain was performed on 12/01/13, which revealed extensive small vessel ischemic changes throughout the deep and subcortical white matter, including remote small infarcts involving the cerebellum and  left basal ganglia.  No acute findings noted.  She also reports previous episodes of bi-temporal head pain associated with feeling of lightheadedness.  It is not associated with spinning sensation.  It lasted about 2 weeks and resolved about a week  ago.  It was different than her usual vertigo.  PAST MEDICAL HISTORY: Past Medical History  Diagnosis Date  . Shingles     zoster 1990 on the back and on the abdomen in 2007,facial zoster 2006  . Hypertension   . A-fib   . Glaucoma   . Pulmonary embolism recurrent     IVC filter  . Acid reflux   . Arthritis   . Migraine   . Postherpetic trigeminal neuralgia 10/20/2012  . Unspecified cerebral artery occlusion with cerebral infarction   . Memory loss   . Chronic anticoagulation 03/07/2013    Lovenox 100 QD  . Junctional bradycardia     resolved with discontinuation of sotalol (2014) after 10 yrs  . DVT (deep venous thrombosis) 2007    recurrent w PE s/p [place] filter  . Hypoglycemia   . GERD (gastroesophageal reflux disease)     w hiatal hernia endoscopy 2003, Dr Penelope Coop  . Diverticulosis   . Phlebitis   . Osteoarthritis     in Bilateral knees and back. -Dr Latanya Maudlin  . Panic attacks   . Anxiety   . IBS (irritable bowel syndrome)   . Hypothyroidism     goiter  . Vitamin D deficiency   . CVA (cerebral infarction) 1964  . Osteoporosis   . Trigeminal neuralgia     r face--Dr Dohmeier  . Hypercholesterolemia   . Chronic kidney disease     Stage III  . Dementia     Dr Brett Fairy    MEDICATIONS: Current Outpatient Prescriptions on File Prior to Visit  Medication Sig Dispense Refill  . acetaminophen-codeine (TYLENOL #3) 300-30 MG per tablet Take 1 tablet by mouth every 6 (six) hours as needed for moderate pain.  30 tablet  0  . amLODipine (NORVASC) 5 MG tablet Take 1 tablet (5 mg total) by mouth daily.  30 tablet  11  . carbamazepine (CARBATROL) 100 MG 12 hr capsule Take 200-300 mg by mouth 3 (three) times daily. Takes 300mg  in am, 200mg  at noon, 300mg  at bedtime (all after meals)      . enoxaparin (LOVENOX) 100 MG/ML injection Inject 100 mg into the skin at bedtime.       Marland Kitchen EPINEPHrine (EPIPEN IJ) Inject as directed once as needed. For allergic reactions      . Fe  Fum-FA-B Cmp-C-Zn-Mg-Mn-Cu (HEMOCYTE PLUS) 106-1 MG CAPS Take 1 capsule by mouth daily.       . folic acid (FOLVITE) 1 MG tablet Take 1 mg by mouth every morning.       . fosfomycin (MONUROL) 3 G PACK Take 3 g by mouth once.  1 g  0  . Gabapentin, PHN, 300 MG TABS Take 300 mg by mouth at bedtime.      Marland Kitchen HYDROcodone-acetaminophen (NORCO/VICODIN) 5-325 MG per tablet Take 1 tablet by mouth every 6 (six) hours as needed for moderate pain.      Marland Kitchen levothyroxine (SYNTHROID, LEVOTHROID) 75 MCG tablet Take 75 mcg by mouth daily before breakfast.       . loratadine (CLARITIN) 10 MG tablet Take 10 mg by mouth daily.      . Multiple Vitamin (MULTIVITAMIN) tablet Take 1 tablet by mouth every morning.       Marland Kitchen  pravastatin (PRAVACHOL) 40 MG tablet Take 40 mg by mouth every morning.       . RABEprazole (ACIPHEX) 20 MG tablet Take 20 mg by mouth every morning.       . sodium chloride 1 G tablet Take 1 tablet (1 g total) by mouth 2 (two) times daily with a meal.  60 tablet  0  . Travoprost, BAK Free, (TRAVATAN) 0.004 % SOLN ophthalmic solution Place 1 drop into both eyes at bedtime.      . Vitamin D, Ergocalciferol, (DRISDOL) 50000 UNITS CAPS capsule Take 50,000 Units by mouth every Saturday.        No current facility-administered medications on file prior to visit.    ALLERGIES: Allergies  Allergen Reactions  . Benadryl [Diphenhydramine Hcl] Hives and Shortness Of Breath  . Eye Drops [Tetrahydrazoline Hcl] Other (See Comments)    Burning, itching, swelling  . Flu Virus Vaccine Shortness Of Breath  . Iohexol Hives and Shortness Of Breath     Code: HIVES, Desc: PT IS ALLERGIC TO IVP DYE/IODINE/SHRIMP.  CAUSES SOB AND HIVES PER PT.  STEPHANIE, RT-R, Onset Date: [id number]   . Shrimp [Shellfish Allergy] Anaphylaxis  . Voltaren [Diclofenac Sodium] Other (See Comments)    Acid reflux symptoms  . Ambien [Zolpidem Tartrate] Hives  . Gluten Meal Diarrhea  . Travatan Z [Travoprost] Other (See Comments)    BRAND  NAME ONLY OLCANNOT TOLERATE GENERIC FORM OF EYE DROPS, IT CONTAINS PRESERVATIVES-SENSITIVITY  . Ativan [Lorazepam]     delusional  . Hydrocodone     Only on the higher dosages  . Betadine Antibiotic-Moisturize [Bacitracin-Polymyxin B] Rash  . Biaxin [Clarithromycin] Hives  . Celebrex [Celecoxib] Rash  . Ciprofloxacin Hcl Rash       . Combigan [Brimonidine Tartrate-Timolol] Other (See Comments)    unknown  . Coumadin [Warfarin Sodium] Rash  . Ivp Dye [Iodinated Diagnostic Agents] Hives  . Levaquin [Levofloxacin In D5w] Rash  . Naprosyn [Naproxen] Rash  . Pataday [Olopatadine Hcl] Other (See Comments)  . Penicillins Hives  . Plavix [Clopidogrel Bisulfate] Rash  . Septra [Sulfamethoxazole-Tmp Ds] Hives    FAMILY HISTORY: Family History  Problem Relation Age of Onset  . Diabetes Mother   . Hypertension Mother   . Glaucoma Mother   . CVA Mother   . CAD Mother   . Hypertension Father   . Heart attack Father   . Kidney failure Father   . Heart attack Brother     2 half brothers  . Hypertension Brother   . CVA Brother   . Glaucoma Other   . Glaucoma Other   . Breast cancer Maternal Aunt   . Colon cancer Maternal Uncle   . Rectal cancer Maternal Uncle   . Prostate cancer Maternal Grandfather     SOCIAL HISTORY: History   Social History  . Marital Status: Divorced    Spouse Name: N/A    Number of Children: 3  . Years of Education: 14   Occupational History  . retired     Armed forces training and education officer   Social History Main Topics  . Smoking status: Never Smoker   . Smokeless tobacco: Never Used  . Alcohol Use: No     Comment: very moderate, none  in the last two months  . Drug Use: No  . Sexual Activity: No   Other Topics Concern  . Not on file   Social History Narrative   Patient is retired and lives at home alone. Patient has a  college education.    Patient has three children.   Patient is right-handed.   Patient drinks 1-2 cups of caffeine daily.     REVIEW OF SYSTEMS: Constitutional: No fevers, chills, or sweats, no generalized fatigue, change in appetite Eyes: No visual changes, double vision, eye pain Ear, nose and throat: No hearing loss, ear pain, nasal congestion, sore throat Cardiovascular: No chest pain, palpitations Respiratory:  No shortness of breath at rest or with exertion, wheezes GastrointestinaI: No nausea, vomiting, diarrhea, abdominal pain, fecal incontinence Genitourinary:  No dysuria, urinary retention or frequency Musculoskeletal:  No neck pain, back pain Integumentary: No rash, pruritus, skin lesions Neurological: as above Psychiatric: No depression, insomnia, anxiety Endocrine: No palpitations, fatigue, diaphoresis, mood swings, change in appetite, change in weight, increased thirst Hematologic/Lymphatic:  No anemia, purpura, petechiae. Allergic/Immunologic: no itchy/runny eyes, nasal congestion, recent allergic reactions, rashes  PHYSICAL EXAM: Filed Vitals:   01/12/14 0753  BP: 106/60  Pulse: 100  Resp: 18   General: No acute distress Head:  Normocephalic/atraumatic Neck: supple, no paraspinal tenderness, full range of motion Heart:  Regular rate and rhythm Lungs:  Clear to auscultation bilaterally Back: No paraspinal tenderness Neurological Exam: alert and oriented to person, place, and time. Attention span and concentration intact, delayed recall poor, and remote memory intact, fund of knowledge intact.  Speech fluent and not dysarthric, language intact.   Montreal Cognitive Assessment  01/14/2014 11/15/2013  Visuospatial/ Executive (0/5) 3 2  Naming (0/3) 2 2  Attention: Read list of digits (0/2) 2 2  Attention: Read list of letters (0/1) 1 1  Attention: Serial 7 subtraction starting at 100 (0/3) 3 2  Language: Repeat phrase (0/2) 2 2  Language : Fluency (0/1) 0 0  Abstraction (0/2) 2 1  Delayed Recall (0/5) 1 0  Orientation (0/6) 6 5  Total 22 17  Adjusted Score (based on education) 22  18    CN II-XII intact. Fundi not visualized. Bulk and tone normal, muscle strength 5-/5 throughout. Sensation to pinprick and vibration intact. Deep tendon reflexes 2+ in upper extremities, absent in lower extremities. She has an extensor first digit of the right foot related to surgery but with extensor reflex. Left toe downgoing. Finger to nose intact. Gait wide based and unsteady requiring use of walker, Romberg negative.   IMPRESSION: Post-herpetic neuralgia, stable Mild cognitive impairment, possibly vascular-related.  However, patient and daughter maintain it occurred sudden onset following initiation of gabapentin. Expressive aphasia, questionable TIA, stable  PLAN: 1.  We will discontinue the gabapentin and recheck [place] in 2 months.  If there is no significant improvement, it is likely related to early vascular cognitive impairment.  At that time, would favor starting Aricept. 2.  Continue Carbatrol 3.  Follow up in 2 months.  Metta Clines, DO  CC: [name], MD

## 2014-01-26 ENCOUNTER — Ambulatory Visit: Payer: Medicare Other | Admitting: Neurology

## 2014-01-26 DIAGNOSIS — M170 Bilateral primary osteoarthritis of knee: Secondary | ICD-10-CM | POA: Diagnosis not present

## 2014-02-09 ENCOUNTER — Encounter: Payer: Self-pay | Admitting: Neurology

## 2014-02-14 ENCOUNTER — Other Ambulatory Visit: Payer: Self-pay | Admitting: Neurology

## 2014-02-15 ENCOUNTER — Encounter: Payer: Self-pay | Admitting: Neurology

## 2014-02-21 ENCOUNTER — Telehealth: Payer: Self-pay | Admitting: Neurology

## 2014-02-21 DIAGNOSIS — R42 Dizziness and giddiness: Secondary | ICD-10-CM | POA: Diagnosis not present

## 2014-02-21 DIAGNOSIS — R350 Frequency of micturition: Secondary | ICD-10-CM | POA: Diagnosis not present

## 2014-02-21 NOTE — Telephone Encounter (Signed)
[name], pt's daughter called wanting to speak to a nurse regarding Grilise. [name] would like to know if there Dr. Tomi Likens can recommend physical therapy for pt's vertigo. C/B [phone]

## 2014-02-22 NOTE — Telephone Encounter (Signed)
I have not seen her for vertigo before so she would need to be seen before I can make a referral to PT for vestibular rehab.

## 2014-02-22 NOTE — Telephone Encounter (Signed)
Patients daughter [name]  states she has had face pain again and started taking the Grilise again on 02/21/14 . The daughter is also asking about Physical therapy for the vertigo please advise

## 2014-02-28 ENCOUNTER — Ambulatory Visit (INDEPENDENT_AMBULATORY_CARE_PROVIDER_SITE_OTHER): Payer: Medicare Other | Admitting: Neurology

## 2014-02-28 ENCOUNTER — Encounter: Payer: Self-pay | Admitting: Neurology

## 2014-02-28 VITALS — BP 142/60 | HR 86 | Temp 97.9°F | Resp 14 | Wt 214.3 lb

## 2014-02-28 DIAGNOSIS — R479 Unspecified speech disturbances: Secondary | ICD-10-CM

## 2014-02-28 DIAGNOSIS — R42 Dizziness and giddiness: Secondary | ICD-10-CM

## 2014-02-28 DIAGNOSIS — G501 Atypical facial pain: Secondary | ICD-10-CM | POA: Diagnosis not present

## 2014-02-28 DIAGNOSIS — I639 Cerebral infarction, unspecified: Secondary | ICD-10-CM | POA: Diagnosis not present

## 2014-02-28 DIAGNOSIS — G250 Essential tremor: Secondary | ICD-10-CM | POA: Diagnosis not present

## 2014-02-28 NOTE — Progress Notes (Signed)
NEUROLOGY FOLLOW UP OFFICE NOTE  [name] [id number]  HISTORY OF PRESENT ILLNESS: [name] is an 80 year old right-handed woman with history of hypertension, CAD, atrial fibrillation, stroke with right-sided weakness, hypercholesterolemia, diabetes mellitus, DVT and pulmonary embolism (on Lovenox), migraine, herpes zoster, sleep apnea, gait disorder, memory problems and anxiety who follows up for memory problems and right post-herpetic trigeminal neuralgia. She is accompanied by her daughter.  UPDATE: 1.  She also has been experiencing dizzy spells for the past couple of weeks.  They are brief.  It occurs for less than a second.  It happens when she turns in bed to the left or any head turn when she is up.  There is no associated nausea. 2.  We stopped the gabapentin to see if it was contributing to the cognitive problems, however she developed the facial pain and had to resume it.   3.  She also has been experiencing the speech difficulties, specifically trouble getting some words out.  She was found to have another UTI and yeast infection.  However, the symptoms have persisted longer than usual.  She has not had any other associated focal symptoms. 4.  She reports that her tremor is worse, particularly when she is holding utensils.  I ATYPICAL RIGHT FACIAL PAIN: History: Onset of symptoms started in 2006, following episode of herpes zoster involving she describes sharp pain involving the the right V1 and V2 distribution, associated with allodynia. Chewing, talking, and brushing her teeth triggers and exacerbates the pain. She was subsequently treated for postherpetic neuralgia. Hasn't had an attack since August 2014. Hgb A1c was 5.8 and LDL was 66. It was questionable whether the expressive aphasia was actually a TIA, as she sometimes had periods of clear speech. She was discharged without change in medication.  Current medication: Carbatrol 300mg /200mg /300mg , Gabapentin  300mg  at bedtime.  Past medications: carbamazepine generic which led to breakthrough pain.   09/23/08 MRI BRAIN WO: Small vessel disease, remote infarcts left caudate and left cerebellum 09/23/08 MRA/MRV HEAD: Unremarkable.  07/23/13 CT Head: Small vessel ischemic changes with old strokes in the left cerebellar hemisphere and body of caudate, stable from prior studies.  LABS: 04/22/13 carbamazepine level 7 12/03/13 WBC 5.1, HGB 10, HCT 29.6, PLT 163, MCV 82.5, Na 134, K 5.1, Cl 98, CO2 23, glucose 86, BUN 19, Cr 1.19, Ca 9, Hgb A1c 5.8  II. COGNITIVE DEFICITS: History: This started about a year ago after initiation of gabapentin. It was fairly sudden onset and has not progressed. She tends to repeat herself often, such as telling stories. She repeats questions often. She reports word-finding difficulties as well. She has no difficulty typically with performing ADLs. She typically lives by herself in her one story house. She manages her own finances. She doesn't drive 2 to remote history of blackout spells. She typically does not have problems recognizing people or recalling names. She denies any depression. She denies any hallucinations. She does have history of vivid nightmares. Earlier this month, she fell bruising her hip and fracturing her arm. In June 2015, she was started on Vicodin and then developed more cognitive clouding. As symptoms began after initiation of gabapentin, and became worse after starting Vicodin, she was asked to return for re-evaluation after tapering off these medications. She only takes the Vicodin occasionally now. B12 level from 11/15/13 was 703. There has been no improvement in memory. She reports a maternal aunt with history of dementia. To evaluate cognitive problems, an MRI of  the brain was performed on 12/01/13, which revealed extensive small vessel ischemic changes throughout the deep and subcortical white matter, including remote small infarcts involving the  cerebellum and left basal ganglia. No acute findings noted.  She also reports previous episodes of bi-temporal head pain associated with feeling of lightheadedness. It is not associated with spinning sensation. It lasted about 2 weeks and resolved about a week ago. It was different than her usual vertigo.  PAST MEDICAL HISTORY: Past Medical History  Diagnosis Date  . Shingles     zoster 1990 on the back and on the abdomen in 2007,facial zoster 2006  . Hypertension   . A-fib   . Glaucoma   . Pulmonary embolism recurrent     IVC filter  . Acid reflux   . Arthritis   . Migraine   . Postherpetic trigeminal neuralgia 10/20/2012  . Unspecified cerebral artery occlusion with cerebral infarction   . Memory loss   . Chronic anticoagulation 03/07/2013    Lovenox 100 QD  . Junctional bradycardia     resolved with discontinuation of sotalol (2014) after 10 yrs  . DVT (deep venous thrombosis) 2007    recurrent w PE s/p [place] filter  . Hypoglycemia   . GERD (gastroesophageal reflux disease)     w hiatal hernia endoscopy 2003, Dr Penelope Coop  . Diverticulosis   . Phlebitis   . Osteoarthritis     in Bilateral knees and back. -Dr Latanya Maudlin  . Panic attacks   . Anxiety   . IBS (irritable bowel syndrome)   . Hypothyroidism     goiter  . Vitamin D deficiency   . CVA (cerebral infarction) 1964  . Osteoporosis   . Trigeminal neuralgia     r face--Dr Dohmeier  . Hypercholesterolemia   . Chronic kidney disease     Stage III  . Dementia     Dr Brett Fairy    MEDICATIONS: Current Outpatient Prescriptions on File Prior to Visit  Medication Sig Dispense Refill  . acetaminophen-codeine (TYLENOL #3) 300-30 MG per tablet Take 1 tablet by mouth every 6 (six) hours as needed for moderate pain. 30 tablet 0  . amLODipine (NORVASC) 5 MG tablet Take 1 tablet (5 mg total) by mouth daily. 30 tablet 11  . carbamazepine (CARBATROL) 100 MG 12 hr capsule Take 200-300 mg by mouth 3 (three) times daily.  Takes 300mg  in am, 200mg  at noon, 300mg  at bedtime (all after meals)    . CARBATROL 100 MG 12 hr capsule TAKE 3 caps BY MOUTH EVERY MORNING, 2 CAPS AT NOON, AND 3 caps AT BEDTIME. TAKE AFTER MEALS. 240 capsule 2  . enoxaparin (LOVENOX) 100 MG/ML injection Inject 100 mg into the skin at bedtime.     Marland Kitchen EPINEPHrine (EPIPEN IJ) Inject as directed once as needed. For allergic reactions    . Fe Fum-FA-B Cmp-C-Zn-Mg-Mn-Cu (HEMOCYTE PLUS) 106-1 MG CAPS Take 1 capsule by mouth daily.     . folic acid (FOLVITE) 1 MG tablet Take 1 mg by mouth every morning.     . fosfomycin (MONUROL) 3 G PACK Take 3 g by mouth once. 1 g 0  . Gabapentin, PHN, 300 MG TABS Take 300 mg by mouth at bedtime.    Marland Kitchen HYDROcodone-acetaminophen (NORCO/VICODIN) 5-325 MG per tablet Take 1 tablet by mouth every 6 (six) hours as needed for moderate pain.    Marland Kitchen levothyroxine (SYNTHROID, LEVOTHROID) 75 MCG tablet Take 75 mcg by mouth daily before breakfast.     . loratadine (  CLARITIN) 10 MG tablet Take 10 mg by mouth daily.    . Multiple Vitamin (MULTIVITAMIN) tablet Take 1 tablet by mouth every morning.     . pravastatin (PRAVACHOL) 40 MG tablet Take 40 mg by mouth every morning.     . RABEprazole (ACIPHEX) 20 MG tablet Take 20 mg by mouth every morning.     . sodium chloride 1 G tablet Take 1 tablet (1 g total) by mouth 2 (two) times daily with a meal. 60 tablet 0  . Travoprost, BAK Free, (TRAVATAN) 0.004 % SOLN ophthalmic solution Place 1 drop into both eyes at bedtime.    . Vitamin D, Ergocalciferol, (DRISDOL) 50000 UNITS CAPS capsule Take 50,000 Units by mouth every Saturday.      No current facility-administered medications on file prior to visit.    ALLERGIES: Allergies  Allergen Reactions  . Benadryl [Diphenhydramine Hcl] Hives and Shortness Of Breath  . Eye Drops [Tetrahydrazoline Hcl] Other (See Comments)    Burning, itching, swelling  . Flu Virus Vaccine Shortness Of Breath  . Iohexol Hives and Shortness Of Breath      Code: HIVES, Desc: PT IS ALLERGIC TO IVP DYE/IODINE/SHRIMP.  CAUSES SOB AND HIVES PER PT.  STEPHANIE, RT-R, Onset Date: [id number]   . Shrimp [Shellfish Allergy] Anaphylaxis  . Voltaren [Diclofenac Sodium] Other (See Comments)    Acid reflux symptoms  . Ambien [Zolpidem Tartrate] Hives  . Gluten Meal Diarrhea  . Travatan Z [Travoprost] Other (See Comments)    BRAND NAME ONLY OLCANNOT TOLERATE GENERIC FORM OF EYE DROPS, IT CONTAINS PRESERVATIVES-SENSITIVITY  . Ativan [Lorazepam]     delusional  . Hydrocodone     Only on the higher dosages  . Betadine Antibiotic-Moisturize [Bacitracin-Polymyxin B] Rash  . Biaxin [Clarithromycin] Hives  . Celebrex [Celecoxib] Rash  . Ciprofloxacin Hcl Rash       . Combigan [Brimonidine Tartrate-Timolol] Other (See Comments)    unknown  . Coumadin [Warfarin Sodium] Rash  . Ivp Dye [Iodinated Diagnostic Agents] Hives  . Levaquin [Levofloxacin In D5w] Rash  . Naprosyn [Naproxen] Rash  . Pataday [Olopatadine Hcl] Other (See Comments)  . Penicillins Hives  . Plavix [Clopidogrel Bisulfate] Rash  . Septra [Sulfamethoxazole-Trimethoprim] Hives    FAMILY HISTORY: Family History  Problem Relation Age of Onset  . Diabetes Mother   . Hypertension Mother   . Glaucoma Mother   . CVA Mother   . CAD Mother   . Hypertension Father   . Heart attack Father   . Kidney failure Father   . Heart attack Brother     2 half brothers  . Hypertension Brother   . CVA Brother   . Glaucoma Other   . Glaucoma Other   . Breast cancer Maternal Aunt   . Colon cancer Maternal Uncle   . Rectal cancer Maternal Uncle   . Prostate cancer Maternal Grandfather     SOCIAL HISTORY: History   Social History  . Marital Status: Divorced    Spouse Name: N/A    Number of Children: 3  . Years of Education: 14   Occupational History  . retired     Armed forces training and education officer   Social History Main Topics  . Smoking status: Never Smoker   . Smokeless tobacco: Never Used  .  Alcohol Use: No     Comment: very moderate, none  in the last two months  . Drug Use: No  . Sexual Activity: No   Other Topics Concern  . Not  on file   Social History Narrative   Patient is retired and lives at home alone. Patient has a college education.    Patient has three children.   Patient is right-handed.   Patient drinks 1-2 cups of caffeine daily.    REVIEW OF SYSTEMS: Constitutional: No fevers, chills, or sweats, no generalized fatigue, change in appetite Eyes: No visual changes, double vision, eye pain Ear, nose and throat: No hearing loss, ear pain, nasal congestion, sore throat Cardiovascular: No chest pain, palpitations Respiratory:  No shortness of breath at rest or with exertion, wheezes GastrointestinaI: No nausea, vomiting, diarrhea, abdominal pain, fecal incontinence Genitourinary:  No dysuria, urinary retention or frequency Musculoskeletal:  No neck pain, back pain Integumentary: No rash, pruritus, skin lesions Neurological: as above Psychiatric: No depression, insomnia, anxiety Endocrine: No palpitations, fatigue, diaphoresis, mood swings, change in appetite, change in weight, increased thirst Hematologic/Lymphatic:  No anemia, purpura, petechiae. Allergic/Immunologic: no itchy/runny eyes, nasal congestion, recent allergic reactions, rashes  PHYSICAL EXAM: Filed Vitals:   02/28/14 1133  BP: 142/60  Pulse: 86  Temp: 97.9 F (36.6 C)  Resp: 14   General: No acute distress Head:  Normocephalic/atraumatic Eyes:  Fundi not able to be visualized on inspection Neck: supple, no paraspinal tenderness, full range of motion Heart:  Regular rate and rhythm Lungs:  Clear to auscultation bilaterally Back: No paraspinal tenderness Neurological Exam: alert and oriented to person, place, and time. Attention span and concentration intact, recent and remote memory intact, fund of knowledge intact.  Speech with some paucity but overall fluent and not dysarthric,  language intact.  CN II-XII intact. Fundi not visualized.  Bulk and tone normal, muscle strength 5/5 throughout.  Finger to nose testing intact.  Ambulates with walker  IMPRESSION: 1.  Dizziness, likely left benign paroxysmal peripheral vertigo 2.  Atypical facial pain 3.  Essential tremor 4.  Speech abnormality.  In the past, negative for acute stroke.  Due to recurrent nature, unlikely to be TIA.  Tends to occur in setting of UTI.  5.  Follow up in 2 months.  PLAN: 1.  Vestibular rehab 2.  EEG to assess for any epileptogenic cause of transient speech disturbance. 3.  Continue Carbatrol and gabapentin 4.  Follow up in 2 months  Metta Clines, DO  CC: [name], MD

## 2014-02-28 NOTE — Patient Instructions (Signed)
Continue the gabapentin 300mg  at bedtime We will get an EEG to look for an alternate cause for the speech difficulties We will send you to vestibular rehab for the dizziness Follow up in 2 months.

## 2014-03-10 ENCOUNTER — Ambulatory Visit (INDEPENDENT_AMBULATORY_CARE_PROVIDER_SITE_OTHER): Payer: Medicare Other | Admitting: Neurology

## 2014-03-10 ENCOUNTER — Telehealth: Payer: Self-pay | Admitting: *Deleted

## 2014-03-10 DIAGNOSIS — R479 Unspecified speech disturbances: Secondary | ICD-10-CM

## 2014-03-10 NOTE — Procedures (Signed)
ELECTROENCEPHALOGRAM REPORT  Date of Study: 03/09/2014  Patient's Name: [name] MRN: [id number] Date of Birth: May 27, 1929  Indication: Transient speech disturbance  Medications: Carbatrol, gabapentin, Vicodin, levothyroxine  Technical Summary: This is a multichannel digital EEG recording, using the international 10-20 placement system.  Spike detection software was employed.  Description: The EEG background is symmetric, with a well-developed posterior dominant rhythm of 7-8 Hz, which is reactive to eye opening and closing.  Diffuse beta activity is seen, with a bilateral frontal preponderance.  No focal or generalized abnormalities are seen.  No focal or generalized epileptiform discharges are seen.  Stage II sleep is not seen.  Photic stimulation was performed, and produced no abnormalities.  Hyperventilation was not performed.  ECG revealed normal cardiac rate and rhythm.  Impression: This is a normal routine EEG of the awake and drowsy states, with activating procedures.  A normal study does not rule out the possibility of a seizure disorder in this patient.  Adam R. Tomi Likens, DO

## 2014-03-10 NOTE — Telephone Encounter (Signed)
-----   Message from Dudley Major, DO sent at 03/10/2014  1:37 PM EST ----- eeg looks okay ----- Message -----    From: Dudley Major, DO    Sent: 03/10/2014   1:25 PM      To: Dudley Major, DO

## 2014-03-10 NOTE — Telephone Encounter (Signed)
Patient is aware of normal eeg 

## 2014-03-22 ENCOUNTER — Ambulatory Visit: Payer: Medicare Other | Admitting: Neurology

## 2014-03-28 DIAGNOSIS — E559 Vitamin D deficiency, unspecified: Secondary | ICD-10-CM | POA: Diagnosis not present

## 2014-03-28 DIAGNOSIS — I480 Paroxysmal atrial fibrillation: Secondary | ICD-10-CM | POA: Diagnosis not present

## 2014-03-28 DIAGNOSIS — E039 Hypothyroidism, unspecified: Secondary | ICD-10-CM | POA: Diagnosis not present

## 2014-03-28 DIAGNOSIS — D638 Anemia in other chronic diseases classified elsewhere: Secondary | ICD-10-CM | POA: Diagnosis not present

## 2014-03-28 DIAGNOSIS — N183 Chronic kidney disease, stage 3 (moderate): Secondary | ICD-10-CM | POA: Diagnosis not present

## 2014-03-28 DIAGNOSIS — I129 Hypertensive chronic kidney disease with stage 1 through stage 4 chronic kidney disease, or unspecified chronic kidney disease: Secondary | ICD-10-CM | POA: Diagnosis not present

## 2014-03-28 DIAGNOSIS — Z86010 Personal history of colonic polyps: Secondary | ICD-10-CM | POA: Diagnosis not present

## 2014-03-28 DIAGNOSIS — R7302 Impaired glucose tolerance (oral): Secondary | ICD-10-CM | POA: Diagnosis not present

## 2014-03-28 DIAGNOSIS — E785 Hyperlipidemia, unspecified: Secondary | ICD-10-CM | POA: Diagnosis not present

## 2014-03-28 DIAGNOSIS — G500 Trigeminal neuralgia: Secondary | ICD-10-CM | POA: Diagnosis not present

## 2014-03-28 DIAGNOSIS — Z86711 Personal history of pulmonary embolism: Secondary | ICD-10-CM | POA: Diagnosis not present

## 2014-03-28 DIAGNOSIS — D696 Thrombocytopenia, unspecified: Secondary | ICD-10-CM | POA: Diagnosis not present

## 2014-04-11 ENCOUNTER — Ambulatory Visit (INDEPENDENT_AMBULATORY_CARE_PROVIDER_SITE_OTHER): Payer: Medicare Other | Admitting: Podiatry

## 2014-04-11 ENCOUNTER — Encounter: Payer: Self-pay | Admitting: Podiatry

## 2014-04-11 VITALS — BP 125/67 | HR 80 | Resp 12

## 2014-04-11 DIAGNOSIS — M79676 Pain in unspecified toe(s): Secondary | ICD-10-CM

## 2014-04-11 DIAGNOSIS — B351 Tinea unguium: Secondary | ICD-10-CM

## 2014-04-11 DIAGNOSIS — L84 Corns and callosities: Secondary | ICD-10-CM

## 2014-04-11 NOTE — Patient Instructions (Signed)
Diabetes and Foot Care Diabetes may cause you to have problems because of poor blood supply (circulation) to your feet and legs. This may cause the skin on your feet to become thinner, break easier, and heal more slowly. Your skin may become dry, and the skin may peel and crack. You may also have nerve damage in your legs and feet causing decreased feeling in them. You may not notice minor injuries to your feet that could lead to infections or more serious problems. Taking care of your feet is one of the most important things you can do for yourself.  HOME CARE INSTRUCTIONS  Wear shoes at all times, even in the house. Do not go barefoot. Bare feet are easily injured.  Check your feet daily for blisters, cuts, and redness. If you cannot see the bottom of your feet, use a mirror or ask someone for help.  Wash your feet with warm water (do not use hot water) and mild soap. Then pat your feet and the areas between your toes until they are completely dry. Do not soak your feet as this can dry your skin.  Apply a moisturizing lotion or petroleum jelly (that does not contain alcohol and is unscented) to the skin on your feet and to dry, brittle toenails. Do not apply lotion between your toes.  Trim your toenails straight across. Do not dig under them or around the cuticle. File the edges of your nails with an emery board or nail file.  Do not cut corns or calluses or try to remove them with medicine.  Wear clean socks or stockings every day. Make sure they are not too tight. Do not wear knee-high stockings since they may decrease blood flow to your legs.  Wear shoes that fit properly and have enough cushioning. To break in new shoes, wear them for just a few hours a day. This prevents you from injuring your feet. Always look in your shoes before you put them on to be sure there are no objects inside.  Do not cross your legs. This may decrease the blood flow to your feet.  If you find a minor scrape,  cut, or break in the skin on your feet, keep it and the skin around it clean and dry. These areas may be cleansed with mild soap and water. Do not cleanse the area with peroxide, alcohol, or iodine.  When you remove an adhesive bandage, be sure not to damage the skin around it.  If you have a wound, look at it several times a day to make sure it is healing.  Do not use heating pads or hot water bottles. They may burn your skin. If you have lost feeling in your feet or legs, you may not know it is happening until it is too late.  Make sure your health care provider performs a complete foot exam at least annually or more often if you have foot problems. Report any cuts, sores, or bruises to your health care provider immediately. SEEK MEDICAL CARE IF:   You have an injury that is not healing.  You have cuts or breaks in the skin.  You have an ingrown nail.  You notice redness on your legs or feet.  You feel burning or tingling in your legs or feet.  You have pain or cramps in your legs and feet.  Your legs or feet are numb.  Your feet always feel cold. SEEK IMMEDIATE MEDICAL CARE IF:   There is increasing redness,   swelling, or pain in or around a wound.  There is a red line that goes up your leg.  Pus is coming from a wound.  You develop a fever or as directed by your health care provider.  You notice a bad smell coming from an ulcer or wound. Document Released: 03/08/2000 Document Revised: 11/11/2012 Document Reviewed: 08/18/2012 Exit[employer] Patient Information 2015 [employer]. This information is not intended to replace advice given to you by your health care provider. Make sure you discuss any questions you have with your health care provider.  

## 2014-04-12 NOTE — Progress Notes (Signed)
Patient ID: [name], female   DOB: 10-21-1929, 81 y.o.   MRN: [id number]  Subjective: This patient presents again requesting debridement of painful toenails which are uncomfortable walking wearing shoes as well as a painful keratoses  Objective: The toenails are hypertrophic elongated, incurvated, discolored and tender to pressure 6-10 Bleeding within the plantar keratoses right first MPJ  Assessment: Symptomatic onychomycoses 6-10 Keratoses 1  Plan: Debrided toenails 10 and keratoses 1 without a bleeding  Appointment 3 months

## 2014-04-15 ENCOUNTER — Other Ambulatory Visit: Payer: Self-pay | Admitting: Interventional Cardiology

## 2014-04-28 DIAGNOSIS — H4011X3 Primary open-angle glaucoma, severe stage: Secondary | ICD-10-CM | POA: Diagnosis not present

## 2014-04-28 DIAGNOSIS — H4011X1 Primary open-angle glaucoma, mild stage: Secondary | ICD-10-CM | POA: Diagnosis not present

## 2014-05-02 ENCOUNTER — Encounter: Payer: Self-pay | Admitting: Neurology

## 2014-05-02 ENCOUNTER — Ambulatory Visit (INDEPENDENT_AMBULATORY_CARE_PROVIDER_SITE_OTHER): Payer: Medicare Other | Admitting: Neurology

## 2014-05-02 VITALS — BP 106/64 | HR 76 | Temp 97.7°F | Resp 18 | Ht 65.0 in | Wt 214.8 lb

## 2014-05-02 DIAGNOSIS — R479 Unspecified speech disturbances: Secondary | ICD-10-CM

## 2014-05-02 DIAGNOSIS — B0222 Postherpetic trigeminal neuralgia: Secondary | ICD-10-CM | POA: Diagnosis not present

## 2014-05-02 NOTE — Patient Instructions (Signed)
1.  Continue the Carbatrol and gabapentin 2.  We will re-evaluate memory next visit 3.  Follow up in 6 months

## 2014-05-02 NOTE — Progress Notes (Signed)
NEUROLOGY FOLLOW UP OFFICE NOTE  TRAMAINE Bishop [id number]  HISTORY OF PRESENT ILLNESS: [name] is an 81 year old right-handed woman with history of hypertension, CAD, atrial fibrillation, stroke with right-sided weakness, hypercholesterolemia, diabetes mellitus, DVT and pulmonary embolism (on Lovenox), migraine, herpes zoster, sleep apnea, gait disorder, memory problems and anxiety who follows up for memory problems and right post-herpetic trigeminal neuralgia.  She is accompanied by her daughter.  UPDATE: For post-herpetic neuralgia, she is taking carbatrol 300mg /200mg /300mg  and gabapentin 300mg  at bedtime.  She feels slight allodynia right before her next dose of Carbatrol.  To evaluate episodes of transient speech dysfunction, EEG was performed on 03/10/14, which was normal.    I  ATYPICAL RIGHT FACIAL PAIN: History: Onset of symptoms started in 2006, following episode of herpes zoster involving she describes sharp pain involving the the right V1 and V2 distribution, associated with allodynia. Chewing, talking, and brushing her teeth triggers and exacerbates the pain.  She was subsequently treated for postherpetic neuralgia.   Current medication:  Carbatrol 300mg /200mg /300mg , Gabapentin 300mg  at bedtime.    Past medications:   carbamazepine generic which led to breakthrough pain.    II.  COGNITIVE DEFICITS: History: This started about a year ago after initiation of gabapentin. It was fairly sudden onset and has not progressed.  She tends to repeat herself often, such as telling stories. She repeats questions often. She reports word-finding difficulties as well.  She has no difficulty typically with performing ADLs. She typically lives by herself in her one story house. She manages her own finances. She doesn't drive 2 to remote history of blackout spells. She typically does not have problems recognizing people or recalling names. She denies any depression. She denies any  hallucinations. She does have history of vivid nightmares.  She reports a maternal aunt with history of dementia. To evaluate cognitive problems, an MRI of the brain was performed on 12/01/13, which revealed extensive small vessel ischemic changes throughout the deep and subcortical white matter, including remote small infarcts involving the cerebellum and left basal ganglia.  No acute findings noted.  For several years, she has episodes of recurrent speech hesitancy.  She knows what she wants to say but has trouble getting the words out.  In the past, it has occurred in setting of UTI.    PAST MEDICAL HISTORY: Past Medical History  Diagnosis Date  . Shingles     zoster 1990 on the back and on the abdomen in 2007,facial zoster 2006  . Hypertension   . A-fib   . Glaucoma   . Pulmonary embolism recurrent     IVC filter  . Acid reflux   . Arthritis   . Migraine   . Postherpetic trigeminal neuralgia 10/20/2012  . Unspecified cerebral artery occlusion with cerebral infarction   . Memory loss   . Chronic anticoagulation 03/07/2013    Lovenox 100 QD  . Junctional bradycardia     resolved with discontinuation of sotalol (2014) after 10 yrs  . DVT (deep venous thrombosis) 2007    recurrent w PE s/p [place] filter  . Hypoglycemia   . GERD (gastroesophageal reflux disease)     w hiatal hernia endoscopy 2003, Dr Penelope Coop  . Diverticulosis   . Phlebitis   . Osteoarthritis     in Bilateral knees and back. -Dr Latanya Maudlin  . Panic attacks   . Anxiety   . IBS (irritable bowel syndrome)   . Hypothyroidism     goiter  . Vitamin  D deficiency   . CVA (cerebral infarction) 1964  . Osteoporosis   . Trigeminal neuralgia     r face--Dr Dohmeier  . Hypercholesterolemia   . Chronic kidney disease     Stage III  . Dementia     Dr Brett Fairy    MEDICATIONS: Current Outpatient Prescriptions on File Prior to Visit  Medication Sig Dispense Refill  . acetaminophen-codeine (TYLENOL #3) 300-30 MG per  tablet Take 1 tablet by mouth every 6 (six) hours as needed for moderate pain. 30 tablet 0  . amLODipine (NORVASC) 5 MG tablet TAKE ONE TABLET BY MOUTH EVERY DAY 30 tablet 3  . carbamazepine (CARBATROL) 100 MG 12 hr capsule Take 200-300 mg by mouth 3 (three) times daily. Takes 300mg  in am, 200mg  at noon, 300mg  at bedtime (all after meals)    . CARBATROL 100 MG 12 hr capsule TAKE 3 caps BY MOUTH EVERY MORNING, 2 CAPS AT NOON, AND 3 caps AT BEDTIME. TAKE AFTER MEALS. 240 capsule 2  . enoxaparin (LOVENOX) 100 MG/ML injection Inject 100 mg into the skin at bedtime.     Marland Kitchen EPINEPHrine (EPIPEN IJ) Inject as directed once as needed. For allergic reactions    . Fe Fum-FA-B Cmp-C-Zn-Mg-Mn-Cu (HEMOCYTE PLUS) 106-1 MG CAPS Take 1 capsule by mouth daily.     . folic acid (FOLVITE) 1 MG tablet Take 1 mg by mouth every morning.     . fosfomycin (MONUROL) 3 G PACK Take 3 g by mouth once. 1 g 0  . Gabapentin, PHN, 300 MG TABS Take 300 mg by mouth at bedtime.    Marland Kitchen HYDROcodone-acetaminophen (NORCO/VICODIN) 5-325 MG per tablet Take 1 tablet by mouth every 6 (six) hours as needed for moderate pain.    Marland Kitchen levothyroxine (SYNTHROID, LEVOTHROID) 75 MCG tablet Take 75 mcg by mouth daily before breakfast.     . loratadine (CLARITIN) 10 MG tablet Take 10 mg by mouth daily.    . meclizine (ANTIVERT) 25 MG tablet   1  . Multiple Vitamin (MULTIVITAMIN) tablet Take 1 tablet by mouth every morning.     . pravastatin (PRAVACHOL) 40 MG tablet Take 40 mg by mouth every morning.     . RABEprazole (ACIPHEX) 20 MG tablet Take 20 mg by mouth every morning.     . sodium chloride 1 G tablet Take 1 tablet (1 g total) by mouth 2 (two) times daily with a meal. 60 tablet 0  . Travoprost, BAK Free, (TRAVATAN) 0.004 % SOLN ophthalmic solution Place 1 drop into both eyes at bedtime.    . Vitamin D, Ergocalciferol, (DRISDOL) 50000 UNITS CAPS capsule Take 50,000 Units by mouth every Saturday.     . fluconazole (DIFLUCAN) 150 MG tablet   0   No  current facility-administered medications on file prior to visit.    ALLERGIES: Allergies  Allergen Reactions  . Benadryl [Diphenhydramine Hcl] Hives and Shortness Of Breath  . Eye Drops [Tetrahydrazoline Hcl] Other (See Comments)    Burning, itching, swelling  . Flu Virus Vaccine Shortness Of Breath  . Iohexol Hives and Shortness Of Breath     Code: HIVES, Desc: PT IS ALLERGIC TO IVP DYE/IODINE/SHRIMP.  CAUSES SOB AND HIVES PER PT.  STEPHANIE, RT-R, Onset Date: [id number]   . Shrimp [Shellfish Allergy] Anaphylaxis  . Voltaren [Diclofenac Sodium] Other (See Comments)    Acid reflux symptoms  . Ambien [Zolpidem Tartrate] Hives  . Gluten Meal Diarrhea  . Travatan Z [Travoprost] Other (See Comments)    BRAND  NAME ONLY OLCANNOT TOLERATE GENERIC FORM OF EYE DROPS, IT CONTAINS PRESERVATIVES-SENSITIVITY  . Ativan [Lorazepam]     delusional  . Hydrocodone     Only on the higher dosages  . Betadine Antibiotic-Moisturize [Bacitracin-Polymyxin B] Rash  . Biaxin [Clarithromycin] Hives  . Celebrex [Celecoxib] Rash  . Ciprofloxacin Hcl Rash       . Combigan [Brimonidine Tartrate-Timolol] Other (See Comments)    unknown  . Coumadin [Warfarin Sodium] Rash  . Ivp Dye [Iodinated Diagnostic Agents] Hives  . Levaquin [Levofloxacin In D5w] Rash  . Naprosyn [Naproxen] Rash  . Pataday [Olopatadine Hcl] Other (See Comments)  . Penicillins Hives  . Plavix [Clopidogrel Bisulfate] Rash  . Septra [Sulfamethoxazole-Trimethoprim] Hives    FAMILY HISTORY: Family History  Problem Relation Age of Onset  . Diabetes Mother   . Hypertension Mother   . Glaucoma Mother   . CVA Mother   . CAD Mother   . Hypertension Father   . Heart attack Father   . Kidney failure Father   . Heart attack Brother     2 half brothers  . Hypertension Brother   . CVA Brother   . Glaucoma Other   . Glaucoma Other   . Breast cancer Maternal Aunt   . Colon cancer Maternal Uncle   . Rectal cancer Maternal Uncle   .  Prostate cancer Maternal Grandfather     SOCIAL HISTORY: History   Social History  . Marital Status: Divorced    Spouse Name: N/A    Number of Children: 3  . Years of Education: 14   Occupational History  . retired     Armed forces training and education officer   Social History Main Topics  . Smoking status: Never Smoker   . Smokeless tobacco: Never Used  . Alcohol Use: No     Comment: very moderate, none  in the last two months  . Drug Use: No  . Sexual Activity: No   Other Topics Concern  . Not on file   Social History Narrative   Patient is retired and lives at home alone. Patient has a college education.    Patient has three children.   Patient is right-handed.   Patient drinks 1-2 cups of caffeine daily.    REVIEW OF SYSTEMS: Constitutional: No fevers, chills, or sweats, no generalized fatigue, change in appetite Eyes: No visual changes, double vision, eye pain Ear, nose and throat: No hearing loss, ear pain, nasal congestion, sore throat Cardiovascular: No chest pain, palpitations Respiratory:  No shortness of breath at rest or with exertion, wheezes GastrointestinaI: No nausea, vomiting, diarrhea, abdominal pain, fecal incontinence Genitourinary:  No dysuria, urinary retention or frequency Musculoskeletal:  No neck pain, back pain Integumentary: No rash, pruritus, skin lesions Neurological: as above Psychiatric: No depression, insomnia, anxiety Endocrine: No palpitations, fatigue, diaphoresis, mood swings, change in appetite, change in weight, increased thirst Hematologic/Lymphatic:  No anemia, purpura, petechiae. Allergic/Immunologic: no itchy/runny eyes, nasal congestion, recent allergic reactions, rashes  PHYSICAL EXAM: Filed Vitals:   05/02/14 1351  BP: 106/64  Pulse: 76  Temp: 97.7 F (36.5 C)  Resp: 18   General: No acute distress Head:  Normocephalic/atraumatic Eyes:  Fundi not visualized Neck: supple, no paraspinal tenderness, full range of motion Heart:   Regular rate and rhythm Lungs:  Clear to auscultation bilaterally Back: No paraspinal tenderness Neurological Exam: alert and oriented to person, place, and time. Attention span and concentration intact, recent and remote memory intact, fund of knowledge intact.  Speech with  some paucity but overall fluent and not dysarthric, language intact.  CN II-XII intact. Fundi not visualized.  Bulk and tone normal, muscle strength 5/5 throughout.  Finger to nose testing intact.  Ambulates with walker  IMPRESSION: Atypical facial pain Speech abnormality.  Possibly behavioral  PLAN: 1.  Continue Carbatrol 300mg /200mg /300mg , Gabapentin 300mg  at bedtime 2.  Follow up in 6 months and will recheck [place], DO  CC:  [name], MD

## 2014-05-13 ENCOUNTER — Other Ambulatory Visit: Payer: Self-pay | Admitting: *Deleted

## 2014-05-13 ENCOUNTER — Telehealth: Payer: Self-pay | Admitting: Neurology

## 2014-05-13 DIAGNOSIS — R51 Headache: Secondary | ICD-10-CM

## 2014-05-13 DIAGNOSIS — R251 Tremor, unspecified: Secondary | ICD-10-CM

## 2014-05-13 DIAGNOSIS — R519 Headache, unspecified: Secondary | ICD-10-CM

## 2014-05-13 NOTE — Telephone Encounter (Signed)
PT called wanting to speak to a nurse regarding her current pain. She states that when she touched the right side of her face it is painful. It is not a constant pain like it use to be. Pt wants to know if she needs to increase her meds. Please call pt # [phone]

## 2014-05-13 NOTE — Telephone Encounter (Signed)
If it bothers her enough, then we should increase her medications.  If it is manageable, then I wouldn't do anything.  If it is not manageable, then I would increase gabapentin to 300mg  twice daily (morning and bedtime).  I would also like to check CBC, CMP and carbamazepine level.

## 2014-05-13 NOTE — Telephone Encounter (Signed)
Patient was advise to increase her Gabapentin to 300 mg in am 300 mg noon and 300 mg at bedtime  For the pain . She will pick up lab slip for CBC, CMP and carbamazepine level.  on Monday 05/16/14

## 2014-05-13 NOTE — Telephone Encounter (Signed)
     Expand All Collapse All   PT called wanting to speak to a nurse regarding her current pain. She states that when she touched the right side of her face it is painful. It is not a constant pain like it use to be.  Pt wants to know if she needs to increase her meds.   Please advise

## 2014-05-16 ENCOUNTER — Other Ambulatory Visit: Payer: Self-pay | Admitting: Neurology

## 2014-05-16 DIAGNOSIS — Z86711 Personal history of pulmonary embolism: Secondary | ICD-10-CM | POA: Diagnosis not present

## 2014-05-16 DIAGNOSIS — R238 Other skin changes: Secondary | ICD-10-CM | POA: Diagnosis not present

## 2014-05-16 DIAGNOSIS — I480 Paroxysmal atrial fibrillation: Secondary | ICD-10-CM | POA: Diagnosis not present

## 2014-05-16 DIAGNOSIS — R5383 Other fatigue: Secondary | ICD-10-CM | POA: Diagnosis not present

## 2014-05-16 DIAGNOSIS — G500 Trigeminal neuralgia: Secondary | ICD-10-CM | POA: Diagnosis not present

## 2014-05-17 DIAGNOSIS — R51 Headache: Secondary | ICD-10-CM | POA: Diagnosis not present

## 2014-05-17 DIAGNOSIS — R251 Tremor, unspecified: Secondary | ICD-10-CM | POA: Diagnosis not present

## 2014-05-18 ENCOUNTER — Telehealth: Payer: Self-pay | Admitting: *Deleted

## 2014-05-18 ENCOUNTER — Other Ambulatory Visit: Payer: Self-pay | Admitting: Neurology

## 2014-05-18 LAB — COMPLETE METABOLIC PANEL WITH GFR
ALBUMIN: 4 g/dL (ref 3.5–5.2)
ALK PHOS: 69 U/L (ref 39–117)
ALT: 8 U/L (ref 0–35)
AST: 11 U/L (ref 0–37)
BUN: 24 mg/dL — ABNORMAL HIGH (ref 6–23)
CO2: 23 mEq/L (ref 19–32)
Calcium: 8.7 mg/dL (ref 8.4–10.5)
Chloride: 103 mEq/L (ref 96–112)
Creat: 1.16 mg/dL — ABNORMAL HIGH (ref 0.50–1.10)
GFR, Est African American: 51 mL/min — ABNORMAL LOW
GFR, Est Non African American: 44 mL/min — ABNORMAL LOW
Glucose, Bld: 94 mg/dL (ref 70–99)
Potassium: 4.5 mEq/L (ref 3.5–5.3)
Sodium: 137 mEq/L (ref 135–145)
TOTAL PROTEIN: 6.3 g/dL (ref 6.0–8.3)
Total Bilirubin: 0.3 mg/dL (ref 0.2–1.2)

## 2014-05-18 LAB — CBC
HCT: 30.1 % — ABNORMAL LOW (ref 36.0–46.0)
Hemoglobin: 9.9 g/dL — ABNORMAL LOW (ref 12.0–15.0)
MCH: 27.4 pg (ref 26.0–34.0)
MCHC: 32.9 g/dL (ref 30.0–36.0)
MCV: 83.4 fL (ref 78.0–100.0)
MPV: 9.9 fL (ref 8.6–12.4)
PLATELETS: 162 10*3/uL (ref 150–400)
RBC: 3.61 MIL/uL — ABNORMAL LOW (ref 3.87–5.11)
RDW: 18 % — AB (ref 11.5–15.5)
WBC: 5 10*3/uL (ref 4.0–10.5)

## 2014-05-18 LAB — CARBAMAZEPINE LEVEL, TOTAL: CARBAMAZEPINE LVL: 7.4 ug/mL (ref 4.0–12.0)

## 2014-05-18 NOTE — Telephone Encounter (Signed)
-----   Message from Dudley Major, DO sent at 05/18/2014 11:47 AM EST ----- Labs look stable ----- Message -----    From: Lab in [facility] Interface    Sent: 05/18/2014   5:29 AM      To: Dudley Major, DO

## 2014-05-18 NOTE — Telephone Encounter (Signed)
3 in am 3 at noon and 3 in pm

## 2014-05-18 NOTE — Telephone Encounter (Signed)
Patient is aware of normal labs  

## 2014-05-20 ENCOUNTER — Other Ambulatory Visit: Payer: Self-pay | Admitting: Neurology

## 2014-05-20 DIAGNOSIS — Z1231 Encounter for screening mammogram for malignant neoplasm of breast: Secondary | ICD-10-CM | POA: Diagnosis not present

## 2014-05-20 DIAGNOSIS — Z803 Family history of malignant neoplasm of breast: Secondary | ICD-10-CM | POA: Diagnosis not present

## 2014-05-23 ENCOUNTER — Encounter: Payer: Self-pay | Admitting: Podiatry

## 2014-05-23 ENCOUNTER — Ambulatory Visit (INDEPENDENT_AMBULATORY_CARE_PROVIDER_SITE_OTHER): Payer: Medicare Other | Admitting: Podiatry

## 2014-05-23 ENCOUNTER — Encounter: Payer: Self-pay | Admitting: *Deleted

## 2014-05-23 ENCOUNTER — Telehealth: Payer: Self-pay | Admitting: *Deleted

## 2014-05-23 DIAGNOSIS — L84 Corns and callosities: Secondary | ICD-10-CM

## 2014-05-23 NOTE — Telephone Encounter (Signed)
-----   Message from Blenda Peals sent at 05/23/2014  9:16 AM EST ----- Regarding: Rachel Moulds from [facility]: [phone] Please call Rachel Moulds regarding pt's script that was sent Friday.  Thanks!

## 2014-05-23 NOTE — Telephone Encounter (Signed)
This encounter was created in error - please disregard.

## 2014-05-23 NOTE — Telephone Encounter (Signed)
I spoke with [name] regarding this IP

## 2014-05-24 NOTE — Progress Notes (Signed)
Patient ID: [name], female   DOB: April 11, 1929, 81 y.o.   MRN: [id number]  Subjective: Patient presents complaining of painful plantar callus sub-first right MPJ. She admits that she's been quite active relocating from her house to assisted living. The plantar callus was last debrided on 04/11/2014  Objective: Bleeding callus plantar right first MPJ without any surrounding erythema, edema, warmth or drainage  Assessment: Plantar callus right first MPJ  Plan: Debrided plantar callus right first MPJ and applied salinocaine and a protective pad  Reappoint at next scheduled visit of 3 months from the visit of 04/11/2014

## 2014-06-02 ENCOUNTER — Telehealth: Payer: Self-pay | Admitting: Neurology

## 2014-06-02 NOTE — Telephone Encounter (Signed)
Patient called to verify her medication Gabapentin, she is going to take 1 in am 1 at noon and 1 at bedtime

## 2014-06-02 NOTE — Telephone Encounter (Signed)
Pt called wanting to speak to a nurse in regards to her meds. C/b [phone]

## 2014-06-03 ENCOUNTER — Telehealth: Payer: Self-pay | Admitting: Neurology

## 2014-06-03 NOTE — Telephone Encounter (Signed)
I spoke with [name] patient's daughter  And clarified her  Directions for the cabatrol and Gabapentin

## 2014-06-03 NOTE — Telephone Encounter (Signed)
[name], pt's daughter called wanting to speak to a nurse regarding pt' script for [place] GABAPENTIN  C/B [phone]

## 2014-06-27 DIAGNOSIS — J309 Allergic rhinitis, unspecified: Secondary | ICD-10-CM | POA: Diagnosis not present

## 2014-06-27 DIAGNOSIS — I129 Hypertensive chronic kidney disease with stage 1 through stage 4 chronic kidney disease, or unspecified chronic kidney disease: Secondary | ICD-10-CM | POA: Diagnosis not present

## 2014-06-27 DIAGNOSIS — N183 Chronic kidney disease, stage 3 (moderate): Secondary | ICD-10-CM | POA: Diagnosis not present

## 2014-06-27 DIAGNOSIS — E559 Vitamin D deficiency, unspecified: Secondary | ICD-10-CM | POA: Diagnosis not present

## 2014-07-18 ENCOUNTER — Encounter: Payer: Self-pay | Admitting: Podiatry

## 2014-07-18 ENCOUNTER — Ambulatory Visit (INDEPENDENT_AMBULATORY_CARE_PROVIDER_SITE_OTHER): Payer: Medicare Other | Admitting: Podiatry

## 2014-07-18 DIAGNOSIS — M79676 Pain in unspecified toe(s): Secondary | ICD-10-CM | POA: Diagnosis not present

## 2014-07-18 DIAGNOSIS — Q828 Other specified congenital malformations of skin: Secondary | ICD-10-CM | POA: Diagnosis not present

## 2014-07-18 DIAGNOSIS — B351 Tinea unguium: Secondary | ICD-10-CM

## 2014-07-18 NOTE — Patient Instructions (Signed)
Diabetes and Foot Care Diabetes may cause you to have problems because of poor blood supply (circulation) to your feet and legs. This may cause the skin on your feet to become thinner, break easier, and heal more slowly. Your skin may become dry, and the skin may peel and crack. You may also have nerve damage in your legs and feet causing decreased feeling in them. You may not notice minor injuries to your feet that could lead to infections or more serious problems. Taking care of your feet is one of the most important things you can do for yourself.  HOME CARE INSTRUCTIONS  Wear shoes at all times, even in the house. Do not go barefoot. Bare feet are easily injured.  Check your feet daily for blisters, cuts, and redness. If you cannot see the bottom of your feet, use a mirror or ask someone for help.  Wash your feet with warm water (do not use hot water) and mild soap. Then pat your feet and the areas between your toes until they are completely dry. Do not soak your feet as this can dry your skin.  Apply a moisturizing lotion or petroleum jelly (that does not contain alcohol and is unscented) to the skin on your feet and to dry, brittle toenails. Do not apply lotion between your toes.  Trim your toenails straight across. Do not dig under them or around the cuticle. File the edges of your nails with an emery board or nail file.  Do not cut corns or calluses or try to remove them with medicine.  Wear clean socks or stockings every day. Make sure they are not too tight. Do not wear knee-high stockings since they may decrease blood flow to your legs.  Wear shoes that fit properly and have enough cushioning. To break in new shoes, wear them for just a few hours a day. This prevents you from injuring your feet. Always look in your shoes before you put them on to be sure there are no objects inside.  Do not cross your legs. This may decrease the blood flow to your feet.  If you find a minor scrape,  cut, or break in the skin on your feet, keep it and the skin around it clean and dry. These areas may be cleansed with mild soap and water. Do not cleanse the area with peroxide, alcohol, or iodine.  When you remove an adhesive bandage, be sure not to damage the skin around it.  If you have a wound, look at it several times a day to make sure it is healing.  Do not use heating pads or hot water bottles. They may burn your skin. If you have lost feeling in your feet or legs, you may not know it is happening until it is too late.  Make sure your health care provider performs a complete foot exam at least annually or more often if you have foot problems. Report any cuts, sores, or bruises to your health care provider immediately. SEEK MEDICAL CARE IF:   You have an injury that is not healing.  You have cuts or breaks in the skin.  You have an ingrown nail.  You notice redness on your legs or feet.  You feel burning or tingling in your legs or feet.  You have pain or cramps in your legs and feet.  Your legs or feet are numb.  Your feet always feel cold. SEEK IMMEDIATE MEDICAL CARE IF:   There is increasing redness,   swelling, or pain in or around a wound.  There is a red line that goes up your leg.  Pus is coming from a wound.  You develop a fever or as directed by your health care provider.  You notice a bad smell coming from an ulcer or wound. Document Released: 03/08/2000 Document Revised: 11/11/2012 Document Reviewed: 08/18/2012 Exit[employer] Patient Information 2015 [employer]. This information is not intended to replace advice given to you by your health care provider. Make sure you discuss any questions you have with your health care provider.  

## 2014-07-19 ENCOUNTER — Other Ambulatory Visit: Payer: Self-pay | Admitting: Neurology

## 2014-07-19 NOTE — Progress Notes (Signed)
Patient ID: [name], female   DOB: 1929/12/22, 81 y.o.   MRN: [id number]  Subjective: This patient presents again complaining of painful toenails and a painful plantar callus 1 right foot  Objective: The toenails are elongated, hypertrophic, discolored 6-10 Bleeding callus plantar right first MPJ without any surrounding erythema, edema or warmth noted  Assessment: Symptomatic onychomycoses 6-10  plantar callus right first MPJ  Plan: Debridement toenails 10 and plantar callus 1 without any bleeding  Reappoint 3 months

## 2014-07-20 DIAGNOSIS — R4701 Aphasia: Secondary | ICD-10-CM | POA: Diagnosis not present

## 2014-08-01 DIAGNOSIS — R4701 Aphasia: Secondary | ICD-10-CM | POA: Diagnosis not present

## 2014-08-03 DIAGNOSIS — R4701 Aphasia: Secondary | ICD-10-CM | POA: Diagnosis not present

## 2014-08-08 DIAGNOSIS — R4701 Aphasia: Secondary | ICD-10-CM | POA: Diagnosis not present

## 2014-08-10 DIAGNOSIS — R4701 Aphasia: Secondary | ICD-10-CM | POA: Diagnosis not present

## 2014-08-15 DIAGNOSIS — R4701 Aphasia: Secondary | ICD-10-CM | POA: Diagnosis not present

## 2014-08-17 DIAGNOSIS — R4701 Aphasia: Secondary | ICD-10-CM | POA: Diagnosis not present

## 2014-08-18 DIAGNOSIS — R4701 Aphasia: Secondary | ICD-10-CM | POA: Diagnosis not present

## 2014-08-20 ENCOUNTER — Other Ambulatory Visit: Payer: Self-pay | Admitting: Interventional Cardiology

## 2014-08-23 ENCOUNTER — Other Ambulatory Visit: Payer: Self-pay

## 2014-08-24 DIAGNOSIS — R296 Repeated falls: Secondary | ICD-10-CM | POA: Diagnosis not present

## 2014-08-24 DIAGNOSIS — M6281 Muscle weakness (generalized): Secondary | ICD-10-CM | POA: Diagnosis not present

## 2014-08-24 DIAGNOSIS — R4701 Aphasia: Secondary | ICD-10-CM | POA: Diagnosis not present

## 2014-08-24 DIAGNOSIS — R262 Difficulty in walking, not elsewhere classified: Secondary | ICD-10-CM | POA: Diagnosis not present

## 2014-08-25 ENCOUNTER — Telehealth: Payer: Self-pay | Admitting: Neurology

## 2014-08-25 DIAGNOSIS — M6281 Muscle weakness (generalized): Secondary | ICD-10-CM | POA: Diagnosis not present

## 2014-08-25 DIAGNOSIS — R296 Repeated falls: Secondary | ICD-10-CM | POA: Diagnosis not present

## 2014-08-25 DIAGNOSIS — R4701 Aphasia: Secondary | ICD-10-CM | POA: Diagnosis not present

## 2014-08-25 DIAGNOSIS — R262 Difficulty in walking, not elsewhere classified: Secondary | ICD-10-CM | POA: Diagnosis not present

## 2014-08-25 NOTE — Telephone Encounter (Signed)
Trying to close referral out for Neuro Rehab referral. Did patient go for NeurRehab? Do we have notes? Please follow up on this and let me know so I can closed the referral / sherri S.

## 2014-08-29 ENCOUNTER — Telehealth: Payer: Self-pay | Admitting: Neurology

## 2014-08-29 DIAGNOSIS — R296 Repeated falls: Secondary | ICD-10-CM | POA: Diagnosis not present

## 2014-08-29 DIAGNOSIS — M6281 Muscle weakness (generalized): Secondary | ICD-10-CM | POA: Diagnosis not present

## 2014-08-29 DIAGNOSIS — R262 Difficulty in walking, not elsewhere classified: Secondary | ICD-10-CM | POA: Diagnosis not present

## 2014-08-29 DIAGNOSIS — R4701 Aphasia: Secondary | ICD-10-CM | POA: Diagnosis not present

## 2014-08-29 NOTE — Telephone Encounter (Signed)
Please advise 

## 2014-08-29 NOTE — Telephone Encounter (Signed)
I need to know what these orders for PT/OT are for.

## 2014-08-29 NOTE — Telephone Encounter (Signed)
[facility] called, needs VO for OT and PT. Please call [phone] / Sherri S.

## 2014-08-29 NOTE — Telephone Encounter (Signed)
Spoke with [facility] she states that [name] had a fall recently in her bathroom and the patient is requesting PT and OT for strengthening. She stae she will fax over the order and it details. It just needs to be signed and sent back

## 2014-08-30 NOTE — Telephone Encounter (Signed)
I reviewed the note from [facility].  We can place order for PT/OT as requested.

## 2014-08-30 NOTE — Telephone Encounter (Signed)
Orders faxed back to [facility]

## 2014-08-30 NOTE — Telephone Encounter (Signed)
This needs to be ordered by her PCP.  I am a specialist who has seen her for facial pain and memory problems only.

## 2014-08-30 NOTE — Telephone Encounter (Signed)
Pt would like a call back in regards to her medication/Dawn CB# [phone]

## 2014-08-30 NOTE — Telephone Encounter (Signed)
[name] called in regards to a referral for physical and occupational therapy for [name]  CB# [phone]

## 2014-08-30 NOTE — Telephone Encounter (Signed)
Please advise 

## 2014-08-31 ENCOUNTER — Other Ambulatory Visit: Payer: Self-pay | Admitting: *Deleted

## 2014-08-31 DIAGNOSIS — R296 Repeated falls: Secondary | ICD-10-CM | POA: Diagnosis not present

## 2014-08-31 DIAGNOSIS — R262 Difficulty in walking, not elsewhere classified: Secondary | ICD-10-CM | POA: Diagnosis not present

## 2014-08-31 DIAGNOSIS — M6281 Muscle weakness (generalized): Secondary | ICD-10-CM | POA: Diagnosis not present

## 2014-08-31 DIAGNOSIS — R4701 Aphasia: Secondary | ICD-10-CM | POA: Diagnosis not present

## 2014-08-31 MED ORDER — GABAPENTIN 300 MG PO CAPS: 300.0000 mg | ORAL_CAPSULE | Freq: Three times a day (TID) | ORAL | Status: DC

## 2014-08-31 NOTE — Telephone Encounter (Signed)
Spoke with patient Rx clarified and refill called in to the pharmacy; Gabapentin 300mg  three time a day 1 in am 1 at noon 1 at bed time.  Orders also faxed to [facility] for therapy

## 2014-09-01 DIAGNOSIS — R262 Difficulty in walking, not elsewhere classified: Secondary | ICD-10-CM | POA: Diagnosis not present

## 2014-09-01 DIAGNOSIS — R296 Repeated falls: Secondary | ICD-10-CM | POA: Diagnosis not present

## 2014-09-01 DIAGNOSIS — R4701 Aphasia: Secondary | ICD-10-CM | POA: Diagnosis not present

## 2014-09-01 DIAGNOSIS — M6281 Muscle weakness (generalized): Secondary | ICD-10-CM | POA: Diagnosis not present

## 2014-09-05 DIAGNOSIS — R4701 Aphasia: Secondary | ICD-10-CM | POA: Diagnosis not present

## 2014-09-05 DIAGNOSIS — R296 Repeated falls: Secondary | ICD-10-CM | POA: Diagnosis not present

## 2014-09-05 DIAGNOSIS — M6281 Muscle weakness (generalized): Secondary | ICD-10-CM | POA: Diagnosis not present

## 2014-09-05 DIAGNOSIS — R262 Difficulty in walking, not elsewhere classified: Secondary | ICD-10-CM | POA: Diagnosis not present

## 2014-09-06 ENCOUNTER — Other Ambulatory Visit: Payer: Self-pay | Admitting: Neurology

## 2014-09-06 DIAGNOSIS — R296 Repeated falls: Secondary | ICD-10-CM | POA: Diagnosis not present

## 2014-09-06 DIAGNOSIS — R4701 Aphasia: Secondary | ICD-10-CM | POA: Diagnosis not present

## 2014-09-06 DIAGNOSIS — R262 Difficulty in walking, not elsewhere classified: Secondary | ICD-10-CM | POA: Diagnosis not present

## 2014-09-06 DIAGNOSIS — M6281 Muscle weakness (generalized): Secondary | ICD-10-CM | POA: Diagnosis not present

## 2014-09-08 ENCOUNTER — Telehealth: Payer: Self-pay | Admitting: *Deleted

## 2014-09-08 DIAGNOSIS — M6281 Muscle weakness (generalized): Secondary | ICD-10-CM | POA: Diagnosis not present

## 2014-09-08 DIAGNOSIS — R262 Difficulty in walking, not elsewhere classified: Secondary | ICD-10-CM | POA: Diagnosis not present

## 2014-09-08 DIAGNOSIS — R296 Repeated falls: Secondary | ICD-10-CM | POA: Diagnosis not present

## 2014-09-08 DIAGNOSIS — R4701 Aphasia: Secondary | ICD-10-CM | POA: Diagnosis not present

## 2014-09-08 NOTE — Telephone Encounter (Signed)
[facility] called to clarify a Rx Call back number [phone]

## 2014-09-08 NOTE — Telephone Encounter (Signed)
I spoke with pharmacy and  Clarified with then the medication dose

## 2014-09-09 DIAGNOSIS — M6281 Muscle weakness (generalized): Secondary | ICD-10-CM | POA: Diagnosis not present

## 2014-09-09 DIAGNOSIS — R296 Repeated falls: Secondary | ICD-10-CM | POA: Diagnosis not present

## 2014-09-09 DIAGNOSIS — R4701 Aphasia: Secondary | ICD-10-CM | POA: Diagnosis not present

## 2014-09-09 DIAGNOSIS — R262 Difficulty in walking, not elsewhere classified: Secondary | ICD-10-CM | POA: Diagnosis not present

## 2014-09-12 ENCOUNTER — Other Ambulatory Visit: Payer: Self-pay

## 2014-09-12 ENCOUNTER — Other Ambulatory Visit: Payer: Self-pay | Admitting: Interventional Cardiology

## 2014-09-12 DIAGNOSIS — R262 Difficulty in walking, not elsewhere classified: Secondary | ICD-10-CM | POA: Diagnosis not present

## 2014-09-12 DIAGNOSIS — R4701 Aphasia: Secondary | ICD-10-CM | POA: Diagnosis not present

## 2014-09-12 DIAGNOSIS — M6281 Muscle weakness (generalized): Secondary | ICD-10-CM | POA: Diagnosis not present

## 2014-09-12 DIAGNOSIS — R296 Repeated falls: Secondary | ICD-10-CM | POA: Diagnosis not present

## 2014-09-12 MED ORDER — AMLODIPINE BESYLATE 2.5 MG PO TABS: 2.5000 mg | ORAL_TABLET | Freq: Every day | ORAL | Status: DC

## 2014-09-13 DIAGNOSIS — R296 Repeated falls: Secondary | ICD-10-CM | POA: Diagnosis not present

## 2014-09-13 DIAGNOSIS — M6281 Muscle weakness (generalized): Secondary | ICD-10-CM | POA: Diagnosis not present

## 2014-09-13 DIAGNOSIS — R262 Difficulty in walking, not elsewhere classified: Secondary | ICD-10-CM | POA: Diagnosis not present

## 2014-09-13 DIAGNOSIS — R4701 Aphasia: Secondary | ICD-10-CM | POA: Diagnosis not present

## 2014-09-14 DIAGNOSIS — R262 Difficulty in walking, not elsewhere classified: Secondary | ICD-10-CM | POA: Diagnosis not present

## 2014-09-14 DIAGNOSIS — M6281 Muscle weakness (generalized): Secondary | ICD-10-CM | POA: Diagnosis not present

## 2014-09-14 DIAGNOSIS — R296 Repeated falls: Secondary | ICD-10-CM | POA: Diagnosis not present

## 2014-09-14 DIAGNOSIS — R4701 Aphasia: Secondary | ICD-10-CM | POA: Diagnosis not present

## 2014-09-15 ENCOUNTER — Other Ambulatory Visit: Payer: Self-pay | Admitting: Interventional Cardiology

## 2014-09-15 DIAGNOSIS — R262 Difficulty in walking, not elsewhere classified: Secondary | ICD-10-CM | POA: Diagnosis not present

## 2014-09-15 DIAGNOSIS — R4701 Aphasia: Secondary | ICD-10-CM | POA: Diagnosis not present

## 2014-09-15 DIAGNOSIS — M6281 Muscle weakness (generalized): Secondary | ICD-10-CM | POA: Diagnosis not present

## 2014-09-15 DIAGNOSIS — R296 Repeated falls: Secondary | ICD-10-CM | POA: Diagnosis not present

## 2014-09-16 DIAGNOSIS — R4701 Aphasia: Secondary | ICD-10-CM | POA: Diagnosis not present

## 2014-09-16 DIAGNOSIS — R296 Repeated falls: Secondary | ICD-10-CM | POA: Diagnosis not present

## 2014-09-16 DIAGNOSIS — M6281 Muscle weakness (generalized): Secondary | ICD-10-CM | POA: Diagnosis not present

## 2014-09-16 DIAGNOSIS — R262 Difficulty in walking, not elsewhere classified: Secondary | ICD-10-CM | POA: Diagnosis not present

## 2014-09-19 DIAGNOSIS — M6281 Muscle weakness (generalized): Secondary | ICD-10-CM | POA: Diagnosis not present

## 2014-09-19 DIAGNOSIS — R296 Repeated falls: Secondary | ICD-10-CM | POA: Diagnosis not present

## 2014-09-19 DIAGNOSIS — R262 Difficulty in walking, not elsewhere classified: Secondary | ICD-10-CM | POA: Diagnosis not present

## 2014-09-19 DIAGNOSIS — R4701 Aphasia: Secondary | ICD-10-CM | POA: Diagnosis not present

## 2014-09-20 DIAGNOSIS — N390 Urinary tract infection, site not specified: Secondary | ICD-10-CM | POA: Diagnosis not present

## 2014-09-20 DIAGNOSIS — R262 Difficulty in walking, not elsewhere classified: Secondary | ICD-10-CM | POA: Diagnosis not present

## 2014-09-20 DIAGNOSIS — R4701 Aphasia: Secondary | ICD-10-CM | POA: Diagnosis not present

## 2014-09-20 DIAGNOSIS — M6281 Muscle weakness (generalized): Secondary | ICD-10-CM | POA: Diagnosis not present

## 2014-09-20 DIAGNOSIS — R296 Repeated falls: Secondary | ICD-10-CM | POA: Diagnosis not present

## 2014-09-21 DIAGNOSIS — R262 Difficulty in walking, not elsewhere classified: Secondary | ICD-10-CM | POA: Diagnosis not present

## 2014-09-21 DIAGNOSIS — M6281 Muscle weakness (generalized): Secondary | ICD-10-CM | POA: Diagnosis not present

## 2014-09-21 DIAGNOSIS — R296 Repeated falls: Secondary | ICD-10-CM | POA: Diagnosis not present

## 2014-09-21 DIAGNOSIS — R4701 Aphasia: Secondary | ICD-10-CM | POA: Diagnosis not present

## 2014-09-22 DIAGNOSIS — R296 Repeated falls: Secondary | ICD-10-CM | POA: Diagnosis not present

## 2014-09-22 DIAGNOSIS — M6281 Muscle weakness (generalized): Secondary | ICD-10-CM | POA: Diagnosis not present

## 2014-09-22 DIAGNOSIS — R262 Difficulty in walking, not elsewhere classified: Secondary | ICD-10-CM | POA: Diagnosis not present

## 2014-09-22 DIAGNOSIS — R4701 Aphasia: Secondary | ICD-10-CM | POA: Diagnosis not present

## 2014-09-23 DIAGNOSIS — R4701 Aphasia: Secondary | ICD-10-CM | POA: Diagnosis not present

## 2014-09-23 DIAGNOSIS — M6281 Muscle weakness (generalized): Secondary | ICD-10-CM | POA: Diagnosis not present

## 2014-09-23 DIAGNOSIS — R262 Difficulty in walking, not elsewhere classified: Secondary | ICD-10-CM | POA: Diagnosis not present

## 2014-09-23 DIAGNOSIS — R296 Repeated falls: Secondary | ICD-10-CM | POA: Diagnosis not present

## 2014-09-27 DIAGNOSIS — R296 Repeated falls: Secondary | ICD-10-CM | POA: Diagnosis not present

## 2014-09-27 DIAGNOSIS — R262 Difficulty in walking, not elsewhere classified: Secondary | ICD-10-CM | POA: Diagnosis not present

## 2014-09-27 DIAGNOSIS — R4701 Aphasia: Secondary | ICD-10-CM | POA: Diagnosis not present

## 2014-09-27 DIAGNOSIS — M6281 Muscle weakness (generalized): Secondary | ICD-10-CM | POA: Diagnosis not present

## 2014-09-28 DIAGNOSIS — R262 Difficulty in walking, not elsewhere classified: Secondary | ICD-10-CM | POA: Diagnosis not present

## 2014-09-28 DIAGNOSIS — M6281 Muscle weakness (generalized): Secondary | ICD-10-CM | POA: Diagnosis not present

## 2014-09-28 DIAGNOSIS — R4701 Aphasia: Secondary | ICD-10-CM | POA: Diagnosis not present

## 2014-09-28 DIAGNOSIS — R296 Repeated falls: Secondary | ICD-10-CM | POA: Diagnosis not present

## 2014-09-29 DIAGNOSIS — N183 Chronic kidney disease, stage 3 (moderate): Secondary | ICD-10-CM | POA: Diagnosis not present

## 2014-09-29 DIAGNOSIS — R296 Repeated falls: Secondary | ICD-10-CM | POA: Diagnosis not present

## 2014-09-29 DIAGNOSIS — N390 Urinary tract infection, site not specified: Secondary | ICD-10-CM | POA: Diagnosis not present

## 2014-09-29 DIAGNOSIS — R479 Unspecified speech disturbances: Secondary | ICD-10-CM | POA: Diagnosis not present

## 2014-09-29 DIAGNOSIS — R4701 Aphasia: Secondary | ICD-10-CM | POA: Diagnosis not present

## 2014-09-29 DIAGNOSIS — M6281 Muscle weakness (generalized): Secondary | ICD-10-CM | POA: Diagnosis not present

## 2014-09-29 DIAGNOSIS — I129 Hypertensive chronic kidney disease with stage 1 through stage 4 chronic kidney disease, or unspecified chronic kidney disease: Secondary | ICD-10-CM | POA: Diagnosis not present

## 2014-09-29 DIAGNOSIS — I480 Paroxysmal atrial fibrillation: Secondary | ICD-10-CM | POA: Diagnosis not present

## 2014-09-29 DIAGNOSIS — R262 Difficulty in walking, not elsewhere classified: Secondary | ICD-10-CM | POA: Diagnosis not present

## 2014-09-30 DIAGNOSIS — M6281 Muscle weakness (generalized): Secondary | ICD-10-CM | POA: Diagnosis not present

## 2014-09-30 DIAGNOSIS — R296 Repeated falls: Secondary | ICD-10-CM | POA: Diagnosis not present

## 2014-09-30 DIAGNOSIS — R262 Difficulty in walking, not elsewhere classified: Secondary | ICD-10-CM | POA: Diagnosis not present

## 2014-09-30 DIAGNOSIS — R4701 Aphasia: Secondary | ICD-10-CM | POA: Diagnosis not present

## 2014-10-03 ENCOUNTER — Ambulatory Visit: Payer: Self-pay | Admitting: Podiatry

## 2014-10-03 DIAGNOSIS — M25532 Pain in left wrist: Secondary | ICD-10-CM | POA: Diagnosis not present

## 2014-10-03 DIAGNOSIS — R296 Repeated falls: Secondary | ICD-10-CM | POA: Diagnosis not present

## 2014-10-03 DIAGNOSIS — R262 Difficulty in walking, not elsewhere classified: Secondary | ICD-10-CM | POA: Diagnosis not present

## 2014-10-03 DIAGNOSIS — R4701 Aphasia: Secondary | ICD-10-CM | POA: Diagnosis not present

## 2014-10-03 DIAGNOSIS — M6281 Muscle weakness (generalized): Secondary | ICD-10-CM | POA: Diagnosis not present

## 2014-10-04 ENCOUNTER — Encounter: Payer: Self-pay | Admitting: Podiatry

## 2014-10-04 ENCOUNTER — Ambulatory Visit (INDEPENDENT_AMBULATORY_CARE_PROVIDER_SITE_OTHER): Payer: Medicare Other | Admitting: Podiatry

## 2014-10-04 DIAGNOSIS — M79676 Pain in unspecified toe(s): Secondary | ICD-10-CM | POA: Diagnosis not present

## 2014-10-04 DIAGNOSIS — R4701 Aphasia: Secondary | ICD-10-CM | POA: Diagnosis not present

## 2014-10-04 DIAGNOSIS — B351 Tinea unguium: Secondary | ICD-10-CM | POA: Diagnosis not present

## 2014-10-04 DIAGNOSIS — R296 Repeated falls: Secondary | ICD-10-CM | POA: Diagnosis not present

## 2014-10-04 DIAGNOSIS — M6281 Muscle weakness (generalized): Secondary | ICD-10-CM | POA: Diagnosis not present

## 2014-10-04 DIAGNOSIS — R262 Difficulty in walking, not elsewhere classified: Secondary | ICD-10-CM | POA: Diagnosis not present

## 2014-10-04 DIAGNOSIS — L84 Corns and callosities: Secondary | ICD-10-CM

## 2014-10-04 NOTE — Patient Instructions (Signed)
Diabetes and Foot Care Diabetes may cause you to have problems because of poor blood supply (circulation) to your feet and legs. This may cause the skin on your feet to become thinner, break easier, and heal more slowly. Your skin may become dry, and the skin may peel and crack. You may also have nerve damage in your legs and feet causing decreased feeling in them. You may not notice minor injuries to your feet that could lead to infections or more serious problems. Taking care of your feet is one of the most important things you can do for yourself.  HOME CARE INSTRUCTIONS  Wear shoes at all times, even in the house. Do not go barefoot. Bare feet are easily injured.  Check your feet daily for blisters, cuts, and redness. If you cannot see the bottom of your feet, use a mirror or ask someone for help.  Wash your feet with warm water (do not use hot water) and mild soap. Then pat your feet and the areas between your toes until they are completely dry. Do not soak your feet as this can dry your skin.  Apply a moisturizing lotion or petroleum jelly (that does not contain alcohol and is unscented) to the skin on your feet and to dry, brittle toenails. Do not apply lotion between your toes.  Trim your toenails straight across. Do not dig under them or around the cuticle. File the edges of your nails with an emery board or nail file.  Do not cut corns or calluses or try to remove them with medicine.  Wear clean socks or stockings every day. Make sure they are not too tight. Do not wear knee-high stockings since they may decrease blood flow to your legs.  Wear shoes that fit properly and have enough cushioning. To break in new shoes, wear them for just a few hours a day. This prevents you from injuring your feet. Always look in your shoes before you put them on to be sure there are no objects inside.  Do not cross your legs. This may decrease the blood flow to your feet.  If you find a minor scrape,  cut, or break in the skin on your feet, keep it and the skin around it clean and dry. These areas may be cleansed with mild soap and water. Do not cleanse the area with peroxide, alcohol, or iodine.  When you remove an adhesive bandage, be sure not to damage the skin around it.  If you have a wound, look at it several times a day to make sure it is healing.  Do not use heating pads or hot water bottles. They may burn your skin. If you have lost feeling in your feet or legs, you may not know it is happening until it is too late.  Make sure your health care provider performs a complete foot exam at least annually or more often if you have foot problems. Report any cuts, sores, or bruises to your health care provider immediately. SEEK MEDICAL CARE IF:   You have an injury that is not healing.  You have cuts or breaks in the skin.  You have an ingrown nail.  You notice redness on your legs or feet.  You feel burning or tingling in your legs or feet.  You have pain or cramps in your legs and feet.  Your legs or feet are numb.  Your feet always feel cold. SEEK IMMEDIATE MEDICAL CARE IF:   There is increasing redness,   swelling, or pain in or around a wound.  There is a red line that goes up your leg.  Pus is coming from a wound.  You develop a fever or as directed by your health care provider.  You notice a bad smell coming from an ulcer or wound. Document Released: 03/08/2000 Document Revised: 11/11/2012 Document Reviewed: 08/18/2012 Exit[employer] Patient Information 2015 [employer]. This information is not intended to replace advice given to you by your health care provider. Make sure you discuss any questions you have with your health care provider.  

## 2014-10-04 NOTE — Progress Notes (Signed)
Patient ID: [name], female   DOB: 1929-08-16, 80 y.o.   MRN: [id number]  Subjective: This patient presents for scheduled visit complaining of painful toenails and a painful plantar callus  Objective: Orientated 3 The toenails are hypertrophic, incurvated, elongated, discolored and tender to direct palpation 6-10 Large plantar callus right first MPJ  Assessment: Symptomatic onychomycoses 6-10 Plantar keratoses 1 Diabetic  Plan: Debridement toenails mechanically and electrically without any bleeding Debrided plantar callus 1 without any bleeding  Reappoint at 3 months

## 2014-10-06 DIAGNOSIS — R296 Repeated falls: Secondary | ICD-10-CM | POA: Diagnosis not present

## 2014-10-06 DIAGNOSIS — M6281 Muscle weakness (generalized): Secondary | ICD-10-CM | POA: Diagnosis not present

## 2014-10-06 DIAGNOSIS — R4701 Aphasia: Secondary | ICD-10-CM | POA: Diagnosis not present

## 2014-10-06 DIAGNOSIS — R262 Difficulty in walking, not elsewhere classified: Secondary | ICD-10-CM | POA: Diagnosis not present

## 2014-10-07 DIAGNOSIS — M6281 Muscle weakness (generalized): Secondary | ICD-10-CM | POA: Diagnosis not present

## 2014-10-07 DIAGNOSIS — R296 Repeated falls: Secondary | ICD-10-CM | POA: Diagnosis not present

## 2014-10-07 DIAGNOSIS — R262 Difficulty in walking, not elsewhere classified: Secondary | ICD-10-CM | POA: Diagnosis not present

## 2014-10-07 DIAGNOSIS — R4701 Aphasia: Secondary | ICD-10-CM | POA: Diagnosis not present

## 2014-10-10 DIAGNOSIS — R296 Repeated falls: Secondary | ICD-10-CM | POA: Diagnosis not present

## 2014-10-10 DIAGNOSIS — M6281 Muscle weakness (generalized): Secondary | ICD-10-CM | POA: Diagnosis not present

## 2014-10-10 DIAGNOSIS — R4701 Aphasia: Secondary | ICD-10-CM | POA: Diagnosis not present

## 2014-10-10 DIAGNOSIS — R262 Difficulty in walking, not elsewhere classified: Secondary | ICD-10-CM | POA: Diagnosis not present

## 2014-10-11 DIAGNOSIS — M6281 Muscle weakness (generalized): Secondary | ICD-10-CM | POA: Diagnosis not present

## 2014-10-11 DIAGNOSIS — R262 Difficulty in walking, not elsewhere classified: Secondary | ICD-10-CM | POA: Diagnosis not present

## 2014-10-11 DIAGNOSIS — R4701 Aphasia: Secondary | ICD-10-CM | POA: Diagnosis not present

## 2014-10-11 DIAGNOSIS — R296 Repeated falls: Secondary | ICD-10-CM | POA: Diagnosis not present

## 2014-10-13 DIAGNOSIS — M6281 Muscle weakness (generalized): Secondary | ICD-10-CM | POA: Diagnosis not present

## 2014-10-13 DIAGNOSIS — R262 Difficulty in walking, not elsewhere classified: Secondary | ICD-10-CM | POA: Diagnosis not present

## 2014-10-13 DIAGNOSIS — R296 Repeated falls: Secondary | ICD-10-CM | POA: Diagnosis not present

## 2014-10-13 DIAGNOSIS — R4701 Aphasia: Secondary | ICD-10-CM | POA: Diagnosis not present

## 2014-10-17 DIAGNOSIS — M6281 Muscle weakness (generalized): Secondary | ICD-10-CM | POA: Diagnosis not present

## 2014-10-17 DIAGNOSIS — R4701 Aphasia: Secondary | ICD-10-CM | POA: Diagnosis not present

## 2014-10-17 DIAGNOSIS — R262 Difficulty in walking, not elsewhere classified: Secondary | ICD-10-CM | POA: Diagnosis not present

## 2014-10-17 DIAGNOSIS — R296 Repeated falls: Secondary | ICD-10-CM | POA: Diagnosis not present

## 2014-10-21 DIAGNOSIS — M1712 Unilateral primary osteoarthritis, left knee: Secondary | ICD-10-CM | POA: Diagnosis not present

## 2014-10-27 DIAGNOSIS — H4011X1 Primary open-angle glaucoma, mild stage: Secondary | ICD-10-CM | POA: Diagnosis not present

## 2014-10-28 DIAGNOSIS — M1712 Unilateral primary osteoarthritis, left knee: Secondary | ICD-10-CM | POA: Diagnosis not present

## 2014-10-31 ENCOUNTER — Encounter: Payer: Self-pay | Admitting: Neurology

## 2014-10-31 ENCOUNTER — Ambulatory Visit (INDEPENDENT_AMBULATORY_CARE_PROVIDER_SITE_OTHER): Payer: Medicare Other | Admitting: Neurology

## 2014-10-31 VITALS — BP 132/70 | HR 70 | Resp 16 | Ht 65.0 in | Wt 226.2 lb

## 2014-10-31 DIAGNOSIS — F801 Expressive language disorder: Secondary | ICD-10-CM | POA: Diagnosis not present

## 2014-10-31 DIAGNOSIS — R4189 Other symptoms and signs involving cognitive functions and awareness: Secondary | ICD-10-CM

## 2014-10-31 DIAGNOSIS — B0222 Postherpetic trigeminal neuralgia: Secondary | ICD-10-CM

## 2014-10-31 DIAGNOSIS — I679 Cerebrovascular disease, unspecified: Secondary | ICD-10-CM

## 2014-10-31 NOTE — Patient Instructions (Signed)
1.  Continue Carbatrol 300mg  in AM, 200mg  at noon and 300mg  in PM, as well as gabapentin 300mg  three times daily 2.  We will refer you for neurocognitive testing at [facility] 3.  Follow up soon after testing.

## 2014-10-31 NOTE — Progress Notes (Signed)
NEUROLOGY FOLLOW UP OFFICE NOTE  [name] [id number]  HISTORY OF PRESENT ILLNESS: [name] is an 81 year old right-handed woman with history of hypertension, CAD, atrial fibrillation, stroke with right-sided weakness, hypercholesterolemia, diabetes mellitus, DVT and pulmonary embolism (on Lovenox), migraine, herpes zoster, sleep apnea, gait disorder, memory problems and anxiety who follows up for memory problems and right post-herpetic trigeminal neuralgia.  She is accompanied by her daughter who provides some history.  Labs from February reviewed.  I  ATYPICAL RIGHT FACIAL PAIN: History: Onset of symptoms started in 2006, following episode of herpes zoster involving she describes sharp pain involving the the right V1 and V2 distribution, associated with allodynia. Chewing, talking, and brushing her teeth triggers and exacerbates the pain.  She was subsequently treated for postherpetic neuralgia.    Current medication:  Carbatrol 300mg /200mg /300mg , Gabapentin 300mg  at bedtime.    Past medications:   carbamazepine generic which led to breakthrough pain.    Update: Pain fairly well-controlled.  She only notes discomfort when she touches the side of her face.  Labs from 05/17/14 include Na 137, WBC 5, HGB 9.9, HCT 30.1, PLT 162 and carbamazepine level of 7.4.  II.  COGNITIVE DEFICITS: History: This started about a year ago after initiation of gabapentin. It was fairly sudden onset and has not progressed.  She tends to repeat herself often, such as telling stories. She repeats questions often. She reports word-finding difficulties as well.  She has no difficulty typically with performing ADLs. She typically lives by herself in her one story house. She manages her own finances. She doesn't drive 2 to remote history of blackout spells. She typically does not have problems recognizing people or recalling names. She denies any depression. She denies any hallucinations. She does have history  of vivid nightmares.  She reports a maternal aunt with history of dementia. To evaluate cognitive problems, an MRI of the brain was performed on 12/01/13, which revealed extensive small vessel ischemic changes throughout the deep and subcortical white matter, including remote small infarcts involving the cerebellum and left basal ganglia.  No acute findings noted.  For several years, she has episodes of recurrent speech hesitancy.  She knows what she wants to say but has trouble getting the words out.  In the past, it has occurred in setting of UTI.   To evaluate episodes of transient speech dysfunction, EEG was performed on 03/10/14, which was normal.    Update: She went to speech therapy without any real improvement.  Therapist thinks it is likely medication-related.  PAST MEDICAL HISTORY: Past Medical History  Diagnosis Date  . Shingles     zoster 1990 on the back and on the abdomen in 2007,facial zoster 2006  . Hypertension   . A-fib   . Glaucoma   . Pulmonary embolism recurrent     IVC filter  . Acid reflux   . Arthritis   . Migraine   . Postherpetic trigeminal neuralgia 10/20/2012  . Unspecified cerebral artery occlusion with cerebral infarction   . Memory loss   . Chronic anticoagulation 03/07/2013    Lovenox 100 QD  . Junctional bradycardia     resolved with discontinuation of sotalol (2014) after 10 yrs  . DVT (deep venous thrombosis) 2007    recurrent w PE s/p [place] filter  . Hypoglycemia   . GERD (gastroesophageal reflux disease)     w hiatal hernia endoscopy 2003, Dr Penelope Coop  . Diverticulosis   . Phlebitis   . Osteoarthritis  in Bilateral knees and back. -Dr Latanya Maudlin  . Panic attacks   . Anxiety   . IBS (irritable bowel syndrome)   . Hypothyroidism     goiter  . Vitamin D deficiency   . CVA (cerebral infarction) 1964  . Osteoporosis   . Trigeminal neuralgia     r face--Dr Dohmeier  . Hypercholesterolemia   . Chronic kidney disease     Stage III  .  Dementia     Dr Brett Fairy    MEDICATIONS: Current Outpatient Prescriptions on File Prior to Visit  Medication Sig Dispense Refill  . acetaminophen-codeine (TYLENOL #3) 300-30 MG per tablet Take 1 tablet by mouth every 6 (six) hours as needed for moderate pain. (Patient not taking: Reported on 10/31/2014) 30 tablet 0  . amLODipine (NORVASC) 2.5 MG tablet Take 1 tablet (2.5 mg total) by mouth daily. 30 tablet 3  . amLODipine (NORVASC) 5 MG tablet TAKE ONE TABLET BY MOUTH EVERY DAY 30 tablet 3  . carbamazepine (CARBATROL) 100 MG 12 hr capsule Take 200-300 mg by mouth 3 (three) times daily. Takes 300mg  in am, 200mg  at noon, 300mg  at bedtime (all after meals)    . CARBATROL 100 MG 12 hr capsule TAKE 3 CAPSULES BY MOUTH EVERY MORNING, 3 caps AT NOON, & 3 caps AT BEDTIME. TAKE AFTER MEALS. 270 capsule 2  . enoxaparin (LOVENOX) 100 MG/ML injection Inject 100 mg into the skin at bedtime.     Marland Kitchen EPINEPHrine (EPIPEN IJ) Inject as directed once as needed. For allergic reactions    . Fe Fum-FA-B Cmp-C-Zn-Mg-Mn-Cu (HEMOCYTE PLUS) 106-1 MG CAPS Take 1 capsule by mouth daily.     . fluconazole (DIFLUCAN) 150 MG tablet   0  . fluticasone (FLONASE) 50 MCG/ACT nasal spray   12  . folic acid (FOLVITE) 1 MG tablet Take 1 mg by mouth every morning.     . fosfomycin (MONUROL) 3 G PACK Take 3 g by mouth once. 1 g 0  . gabapentin (NEURONTIN) 300 MG capsule Take 1 capsule (300 mg total) by mouth 3 (three) times daily. 90 capsule 3  . Gabapentin, PHN, 300 MG TABS Take 300 mg by mouth at bedtime.    . [name] 300 MG TABS TAKE 2 TABLETS BY MOUTH AT BEDTIME (Patient taking differently: tid) 180 tablet 2  . HYDROcodone-acetaminophen (NORCO/VICODIN) 5-325 MG per tablet Take 1 tablet by mouth every 6 (six) hours as needed for moderate pain.    Marland Kitchen levothyroxine (SYNTHROID, LEVOTHROID) 75 MCG tablet Take 75 mcg by mouth daily before breakfast.     . loratadine (CLARITIN) 10 MG tablet Take 10 mg by mouth daily.    . meclizine  (ANTIVERT) 25 MG tablet   1  . Multiple Vitamin (MULTIVITAMIN) tablet Take 1 tablet by mouth every morning.     . pravastatin (PRAVACHOL) 40 MG tablet Take 40 mg by mouth every morning.     . RABEprazole (ACIPHEX) 20 MG tablet Take 20 mg by mouth every morning.     . sodium chloride 1 G tablet Take 1 tablet (1 g total) by mouth 2 (two) times daily with a meal. 60 tablet 0  . Travoprost, BAK Free, (TRAVATAN) 0.004 % SOLN ophthalmic solution Place 1 drop into both eyes at bedtime.    . Vitamin D, Ergocalciferol, (DRISDOL) 50000 UNITS CAPS capsule Take 50,000 Units by mouth every Saturday.      No current facility-administered medications on file prior to visit.    ALLERGIES: Allergies  Allergen  Reactions  . Benadryl [Diphenhydramine Hcl] Hives and Shortness Of Breath  . Eye Drops [Tetrahydrazoline Hcl] Other (See Comments)    Burning, itching, swelling  . Flu Virus Vaccine Shortness Of Breath  . Iohexol Hives and Shortness Of Breath     Code: HIVES, Desc: PT IS ALLERGIC TO IVP DYE/IODINE/SHRIMP.  CAUSES SOB AND HIVES PER PT.  STEPHANIE, RT-R, Onset Date: [id number]   . Shrimp [Shellfish Allergy] Anaphylaxis  . Voltaren [Diclofenac Sodium] Other (See Comments)    Acid reflux symptoms  . Ambien [Zolpidem Tartrate] Hives  . Gluten Meal Diarrhea  . Travatan Z [Travoprost] Other (See Comments)    BRAND NAME ONLY OLCANNOT TOLERATE GENERIC FORM OF EYE DROPS, IT CONTAINS PRESERVATIVES-SENSITIVITY  . Ativan [Lorazepam]     delusional  . Hydrocodone     Only on the higher dosages  . Betadine Antibiotic-Moisturize [Bacitracin-Polymyxin B] Rash  . Biaxin [Clarithromycin] Hives  . Celebrex [Celecoxib] Rash  . Ciprofloxacin Hcl Rash       . Combigan [Brimonidine Tartrate-Timolol] Other (See Comments)    unknown  . Coumadin [Warfarin Sodium] Rash  . Ivp Dye [Iodinated Diagnostic Agents] Hives  . Levaquin [Levofloxacin In D5w] Rash  . Naprosyn [Naproxen] Rash  . Pataday [Olopatadine Hcl]  Other (See Comments)  . Penicillins Hives  . Plavix [Clopidogrel Bisulfate] Rash  . Septra [Sulfamethoxazole-Trimethoprim] Hives    FAMILY HISTORY: Family History  Problem Relation Age of Onset  . Diabetes Mother   . Hypertension Mother   . Glaucoma Mother   . CVA Mother   . CAD Mother   . Hypertension Father   . Heart attack Father   . Kidney failure Father   . Heart attack Brother     2 half brothers  . Hypertension Brother   . CVA Brother   . Glaucoma Other   . Glaucoma Other   . Breast cancer Maternal Aunt   . Colon cancer Maternal Uncle   . Rectal cancer Maternal Uncle   . Prostate cancer Maternal Grandfather     SOCIAL HISTORY: History   Social History  . Marital Status: Divorced    Spouse Name: N/A  . Number of Children: 3  . Years of Education: 14   Occupational History  . retired     Armed forces training and education officer   Social History Main Topics  . Smoking status: Never Smoker   . Smokeless tobacco: Never Used  . Alcohol Use: No     Comment: very moderate, none  in the last two months  . Drug Use: No  . Sexual Activity: No   Other Topics Concern  . Not on file   Social History Narrative   Patient is retired and lives at home alone. Patient has a college education.    Patient has three children.   Patient is right-handed.   Patient drinks 1-2 cups of caffeine daily.    REVIEW OF SYSTEMS: Constitutional: No fevers, chills, or sweats, no generalized fatigue, change in appetite Eyes: No visual changes, double vision, left eye pain (glaucoma) Ear, nose and throat: No hearing loss, ear pain, nasal congestion, sore throat Cardiovascular: No chest pain, palpitations Respiratory:  No shortness of breath at rest or with exertion, wheezes GastrointestinaI: No nausea, vomiting, diarrhea, abdominal pain, fecal incontinence Genitourinary:  No dysuria, urinary retention or frequency Musculoskeletal:  No neck pain, back pain Integumentary: No rash, pruritus, skin  lesions Neurological: as above Psychiatric: No depression, insomnia, anxiety Endocrine: No palpitations, fatigue, diaphoresis, mood swings, change  in appetite, change in weight, increased thirst Hematologic/Lymphatic:  No anemia, purpura, petechiae. Allergic/Immunologic: no itchy/runny eyes, nasal congestion, recent allergic reactions, rashes  PHYSICAL EXAM: Filed Vitals:   10/31/14 1350  BP: 132/70  Pulse: 70  Resp: 16   General: No acute distress.  Patient appears well-groomed.   Head:  Normocephalic/atraumatic Eyes:  Fundi not visualized on inspection Neck: supple, no paraspinal tenderness, full range of motion Heart:  Regular rate and rhythm Lungs:  Clear to auscultation bilaterally Back: No paraspinal tenderness Neurological Exam: alert and oriented to person, place, and time. Attention span and concentration intact, recent memory poor,and remote memory intact, fund of knowledge intact.  Speech with some paucity but overall fluent and not dysarthric, language intact.   Montreal Cognitive Assessment  10/31/2014 01/14/2014 11/15/2013  Visuospatial/ Executive (0/5) 3 3 2   Naming (0/3) 2 2 2   Attention: Read list of digits (0/2) 2 2 2   Attention: Read list of letters (0/1) 1 1 1   Attention: Serial 7 subtraction starting at 100 (0/3) 2 3 2   Language: Repeat phrase (0/2) 2 2 2   Language : Fluency (0/1) 0 0 0  Abstraction (0/2) 2 2 1   Delayed Recall (0/5) 1 1 0  Orientation (0/6) 5 6 5   Total 20 22 17   Adjusted Score (based on education) 20 22 18    CN II-XII intact. Fundi not visualized.  Bulk and tone normal, muscle strength 5/5 throughout.  Finger to nose testing intact.  Ambulates with walker  IMPRESSION: Post-herpetic trigeminal neuralgia Memory deficits.  Possible etiologies include mild cognitive impairment of the Alzheimer's type, vascular or medication related.  Stable compared to prior MOCA from October.  Memory is primarily involved, rather than subcortical, suggesting a  neurodegenerative process. Speech hesitancy Cerebrovascular disease.  PLAN: 1.  The only change in regards to memory would be to start Aricept and/or Namenda.  Since she already may be experiencing side effects on multiple medications, we will forego this and send her for neurocognitive testing first. 2.  On anticoagulation for secondary stroke prevention. 3.  Will continue Carbatrol 300mg /200mg /300mg  and gabapentin 300mg  three times daily 4.  Follow up after testing.  Metta Clines, DO  CC:  [name], MD

## 2014-11-02 DIAGNOSIS — H4011X1 Primary open-angle glaucoma, mild stage: Secondary | ICD-10-CM | POA: Diagnosis not present

## 2014-11-04 DIAGNOSIS — M1712 Unilateral primary osteoarthritis, left knee: Secondary | ICD-10-CM | POA: Diagnosis not present

## 2014-11-07 ENCOUNTER — Other Ambulatory Visit: Payer: Self-pay | Admitting: Neurology

## 2014-11-14 DIAGNOSIS — M1712 Unilateral primary osteoarthritis, left knee: Secondary | ICD-10-CM | POA: Diagnosis not present

## 2014-11-21 DIAGNOSIS — M1712 Unilateral primary osteoarthritis, left knee: Secondary | ICD-10-CM | POA: Diagnosis not present

## 2014-12-06 ENCOUNTER — Telehealth: Payer: Self-pay | Admitting: Neurology

## 2014-12-06 NOTE — Telephone Encounter (Signed)
Pt's daughter [name] called and said they were ready to schedule the testing that Dr Tomi Likens wanted her to have/Dawn CB# [phone]

## 2014-12-07 NOTE — Telephone Encounter (Signed)
Spoke with [name] (Patients daughter) advised that we had faxed referral to [facility].

## 2014-12-07 NOTE — Telephone Encounter (Signed)
LMTCB.. MAH 

## 2014-12-08 ENCOUNTER — Ambulatory Visit (INDEPENDENT_AMBULATORY_CARE_PROVIDER_SITE_OTHER): Payer: Medicare Other | Admitting: Interventional Cardiology

## 2014-12-08 ENCOUNTER — Encounter: Payer: Self-pay | Admitting: Interventional Cardiology

## 2014-12-08 VITALS — BP 158/78 | HR 66 | Ht 65.0 in | Wt 226.1 lb

## 2014-12-08 DIAGNOSIS — Z7901 Long term (current) use of anticoagulants: Secondary | ICD-10-CM

## 2014-12-08 DIAGNOSIS — I480 Paroxysmal atrial fibrillation: Secondary | ICD-10-CM | POA: Diagnosis not present

## 2014-12-08 DIAGNOSIS — G459 Transient cerebral ischemic attack, unspecified: Secondary | ICD-10-CM

## 2014-12-08 DIAGNOSIS — I10 Essential (primary) hypertension: Secondary | ICD-10-CM | POA: Diagnosis not present

## 2014-12-08 NOTE — Patient Instructions (Signed)
Medication Instructions:  Your physician recommends that you continue on your current medications as directed. Please refer to the Current Medication list given to you today.   Labwork: None ordered  Testing/Procedures: None ordered  Follow-Up: Your physician wants you to follow-up in: 1 year with Dr.Smith You will receive a reminder letter in the mail two months in advance. If you don't receive a letter, please call our office to schedule the follow-up appointment.   Any Other Special Instructions Will Be Listed Below (If Applicable).   

## 2014-12-08 NOTE — Progress Notes (Signed)
Cardiology Office Note   Date:  12/08/2014   ID:  UGOCHI HENZLER, DOB 1929/04/04, MRN [id number]  PCP:  Vidal Schwalbe, MD  Cardiologist:  Sinclair Grooms, MD   Chief Complaint  Patient presents with  . Atrial Fibrillation      History of Present Illness: ONEISHA AMMONS is a 81 y.o. female who presents for paroxysmal atrial fibrillation, hypertension, history pulmonary emboli, IVC filter, venous insufficiency, history of dementia, history of CVA, and chronic Lovenox therapy.  Mrs. Voytko is doing relatively well. She has no real cardiopulmonary complaints. She denies hematuria and other bleeding, palpitations. No headache, fall, or head trauma. No history of new neurological complaints. She has had some palpitations but nothing lasting longer than 15-20 seconds.    Past Medical History  Diagnosis Date  . Shingles     zoster 1990 on the back and on the abdomen in 2007,facial zoster 2006  . Hypertension   . A-fib   . Glaucoma   . Pulmonary embolism recurrent     IVC filter  . Acid reflux   . Arthritis   . Migraine   . Postherpetic trigeminal neuralgia 10/20/2012  . Unspecified cerebral artery occlusion with cerebral infarction   . Memory loss   . Chronic anticoagulation 03/07/2013    Lovenox 100 QD  . Junctional bradycardia     resolved with discontinuation of sotalol (2014) after 10 yrs  . DVT (deep venous thrombosis) 2007    recurrent w PE s/p [place] filter  . Hypoglycemia   . GERD (gastroesophageal reflux disease)     w hiatal hernia endoscopy 2003, Dr Penelope Coop  . Diverticulosis   . Phlebitis   . Osteoarthritis     in Bilateral knees and back. -Dr Latanya Maudlin  . Panic attacks   . Anxiety   . IBS (irritable bowel syndrome)   . Hypothyroidism     goiter  . Vitamin D deficiency   . CVA (cerebral infarction) 1964  . Osteoporosis   . Trigeminal neuralgia     r face--Dr Dohmeier  . Hypercholesterolemia   . Chronic kidney disease     Stage III  .  Dementia     Dr Brett Fairy    Past Surgical History  Procedure Laterality Date  . Blood clots  06/2006    lower abdomen  . Pulmonary embolism surgery  03/2005  . Phlebitis  12/2005  . Cholecystectomy  01/1983  . Biopsies of breast Bilateral 1971  . Migraine speech impairment  07/1963  . Hemorrhoid surgery  1966  . Appendectomy  1945  . Tonsillectomy      1954  . Gallbladder surgery  1980  . Insertion of vena cova filter  2007  . Leg / ankle soft tissue biopsy Right     precancerous  . Cardiac catheterization  08/2001    A-fib/Palpitations Dr. Daneen Schick  . Dilation and curettage, diagnostic / therapeutic  1952/1977    x2  . Foot surgery Right 1978    Cyst removed from Right Foot     Current Outpatient Prescriptions  Medication Sig Dispense Refill  . acetaminophen-codeine (TYLENOL #3) 300-30 MG per tablet Take 1 tablet by mouth every 6 (six) hours as needed for moderate pain. 30 tablet 0  . amLODipine (NORVASC) 2.5 MG tablet Take 1 tablet (2.5 mg total) by mouth daily. 30 tablet 3  . CARBATROL 100 MG 12 hr capsule TAKE 3 CAPSULES BY MOUTH EVERY MORNING, 3 caps AT NOON, & 3  caps AT BEDTIME. TAKE AFTER MEALS. 270 capsule 2  . enoxaparin (LOVENOX) 100 MG/ML injection Inject 100 mg into the skin at bedtime.     Marland Kitchen EPINEPHrine (EPIPEN IJ) Inject as directed once as needed. For allergic reactions    . Fe Fum-FA-B Cmp-C-Zn-Mg-Mn-Cu (HEMOCYTE PLUS) 106-1 MG CAPS Take 1 capsule by mouth daily.     . fluticasone (FLONASE) 50 MCG/ACT nasal spray Place 1 spray into left nostril daily as needed for allergies or rhinitis.   12  . folic acid (FOLVITE) 1 MG tablet Take 1 mg by mouth every morning.     . gabapentin (NEURONTIN) 300 MG capsule Take 1 capsule (300 mg total) by mouth 3 (three) times daily. 90 capsule 3  . HYDROcodone-acetaminophen (NORCO/VICODIN) 5-325 MG per tablet Take 1 tablet by mouth every 6 (six) hours as needed for moderate pain.    Marland Kitchen levothyroxine (SYNTHROID, LEVOTHROID) 75  MCG tablet Take 75 mcg by mouth daily before breakfast.     . loratadine (CLARITIN) 10 MG tablet Take 10 mg by mouth daily.    . meclizine (ANTIVERT) 25 MG tablet   1  . Multiple Vitamin (MULTIVITAMIN) tablet Take 1 tablet by mouth every morning.     . pravastatin (PRAVACHOL) 40 MG tablet Take 40 mg by mouth every morning.     . RABEprazole (ACIPHEX) 20 MG tablet Take 20 mg by mouth every morning.     . sodium chloride 1 G tablet Take 1 tablet (1 g total) by mouth 2 (two) times daily with a meal. 60 tablet 0  . Travoprost, BAK Free, (TRAVATAN) 0.004 % SOLN ophthalmic solution Place 1 drop into both eyes at bedtime.    . Vitamin D, Ergocalciferol, (DRISDOL) 50000 UNITS CAPS capsule Take 50,000 Units by mouth every Saturday.      No current facility-administered medications for this visit.    Allergies:   Benadryl; Eye drops; Flu virus vaccine; Iohexol; Shrimp; Voltaren; Ambien; Gluten meal; Travatan z; Ativan; Hydrocodone; Betadine antibiotic-moisturize; Biaxin; Celebrex; Ciprofloxacin hcl; Combigan; Coumadin; Ivp dye; Levaquin; Naprosyn; Pataday; Penicillins; Plavix; and Septra    Social History:  The patient  reports that she has never smoked. She has never used smokeless tobacco. She reports that she does not drink alcohol or use illicit drugs.   Family History:  The patient's family history includes Breast cancer in her maternal aunt; CAD in her mother; CVA in her brother and mother; Colon cancer in her maternal uncle; Diabetes in her mother; Glaucoma in her mother, other, and other; Heart attack in her brother and father; Hypertension in her brother, father, and mother; Kidney failure in her father; Prostate cancer in her maternal grandfather; Rectal cancer in her maternal uncle.    ROS:  Please see the history of present illness.   Otherwise, review of systems are positive for difficulty with speech (a residual of her prior stroke), exertional fatigue, leg pain, palpitations, snoring,  constipation, joint swelling, difficulty with balance, low back discomfort, easy bruising but no real change compared to 6 months ago. Occasional diarrhea and depression..   All other systems are reviewed and negative.    PHYSICAL EXAM: VS:  BP 158/78 mmHg  Pulse 66  Ht 5\' 5"  (1.651 m)  Wt 102.567 kg (226 lb 1.9 oz)  BMI 37.63 kg/m2 , BMI Body mass index is 37.63 kg/(m^2). GEN: Well nourished, well developed, in no acute distress HEENT: normal Neck: no JVD, carotid bruits, or masses Cardiac: RRR.  There is no murmur,  rub, or gallop. There is right greater than left 1+ edema. Respiratory:  clear to auscultation bilaterally, normal work of breathing. GI: soft, nontender, nondistended, + BS MS: no deformity or atrophy Skin: warm and dry, no rash Neuro:  Strength and sensation are intact Psych: euthymic mood, full affect   EKG:  EKG reveals normal sinus rhythm with nonspecific T-wave flattening.   Recent Labs: 05/17/2014: ALT 8; BUN 24*; Creat 1.16*; Hemoglobin 9.9*; Platelets 162; Potassium 4.5; Sodium 137    Lipid Panel    Component Value Date/Time   CHOL 156 12/04/2013 0615   TRIG 72 12/04/2013 0615   HDL 76 12/04/2013 0615   CHOLHDL 2.1 12/04/2013 0615   VLDL 14 12/04/2013 0615   LDLCALC 66 12/04/2013 0615      Wt Readings from Last 3 Encounters:  12/08/14 102.567 kg (226 lb 1.9 oz)  10/31/14 102.604 kg (226 lb 3.2 oz)  05/02/14 97.433 kg (214 lb 12.8 oz)      Other studies Reviewed: Additional studies/ records that were reviewed today include: None. .    ASSESSMENT AND PLAN:  1. Essential hypertension Controlled. My repeat blood pressure was 138/74 - EKG 12-Lead  2. PAF (paroxysmal atrial fibrillation) No prolonged clinical recurrences. - EKG 12-Lead  3. Transient cerebral ischemia, unspecified transient cerebral ischemia type No recurrences.  4. Chronic anticoagulation No bleeding complications on once a day in occipital area and subcutaneous  anticoagulation therapy.    Current medicines are reviewed at length with the patient today.  The patient has the following concerns regarding medicines: None.  The following changes/actions have been instituted:    Call of prolonged palpitations.  No change in medical recommendations or therapy.  Labs/ tests ordered today include:  No orders of the defined types were placed in this encounter.     Disposition:   FU with HS in 1 year  Signed, Sinclair Grooms, MD  12/08/2014 2:37 PM    [address], [place], [place]  [address] Phone: [phone]; Fax: [phone]

## 2014-12-23 DIAGNOSIS — M1712 Unilateral primary osteoarthritis, left knee: Secondary | ICD-10-CM | POA: Diagnosis not present

## 2014-12-28 DIAGNOSIS — H401111 Primary open-angle glaucoma, right eye, mild stage: Secondary | ICD-10-CM | POA: Diagnosis not present

## 2014-12-29 ENCOUNTER — Telehealth: Payer: Self-pay | Admitting: Neurology

## 2014-12-29 NOTE — Telephone Encounter (Signed)
Returned patients call. She just wanted to know the dates that she had her MRI and CT last year for her [facility] Paperwork

## 2014-12-29 NOTE — Telephone Encounter (Signed)
PT would like a call back in regards to forms she is filling out for [facility]/Dawn CB# [phone]

## 2015-01-04 ENCOUNTER — Ambulatory Visit: Payer: Self-pay | Admitting: Podiatry

## 2015-01-07 ENCOUNTER — Other Ambulatory Visit: Payer: Self-pay | Admitting: Interventional Cardiology

## 2015-01-09 DIAGNOSIS — I129 Hypertensive chronic kidney disease with stage 1 through stage 4 chronic kidney disease, or unspecified chronic kidney disease: Secondary | ICD-10-CM | POA: Diagnosis not present

## 2015-01-09 DIAGNOSIS — E559 Vitamin D deficiency, unspecified: Secondary | ICD-10-CM | POA: Diagnosis not present

## 2015-01-09 DIAGNOSIS — R350 Frequency of micturition: Secondary | ICD-10-CM | POA: Diagnosis not present

## 2015-01-09 DIAGNOSIS — D638 Anemia in other chronic diseases classified elsewhere: Secondary | ICD-10-CM | POA: Diagnosis not present

## 2015-01-09 DIAGNOSIS — E785 Hyperlipidemia, unspecified: Secondary | ICD-10-CM | POA: Diagnosis not present

## 2015-01-09 DIAGNOSIS — N183 Chronic kidney disease, stage 3 (moderate): Secondary | ICD-10-CM | POA: Diagnosis not present

## 2015-01-09 DIAGNOSIS — B379 Candidiasis, unspecified: Secondary | ICD-10-CM | POA: Diagnosis not present

## 2015-01-09 DIAGNOSIS — E039 Hypothyroidism, unspecified: Secondary | ICD-10-CM | POA: Diagnosis not present

## 2015-01-10 ENCOUNTER — Ambulatory Visit (INDEPENDENT_AMBULATORY_CARE_PROVIDER_SITE_OTHER): Payer: Medicare Other | Admitting: Podiatry

## 2015-01-10 ENCOUNTER — Encounter: Payer: Self-pay | Admitting: Podiatry

## 2015-01-10 DIAGNOSIS — B351 Tinea unguium: Secondary | ICD-10-CM | POA: Diagnosis not present

## 2015-01-10 DIAGNOSIS — M79676 Pain in unspecified toe(s): Secondary | ICD-10-CM

## 2015-01-10 DIAGNOSIS — L84 Corns and callosities: Secondary | ICD-10-CM | POA: Diagnosis not present

## 2015-01-10 NOTE — Patient Instructions (Signed)
Removed Band-Aid on fourth left toe 1-2 days and apply topical antibiotic ointment and a Band-Aid until a scab forms  Diabetes and Foot Care Diabetes may cause you to have problems because of poor blood supply (circulation) to your feet and legs. This may cause the skin on your feet to become thinner, break easier, and heal more slowly. Your skin may become dry, and the skin may peel and crack. You may also have nerve damage in your legs and feet causing decreased feeling in them. You may not notice minor injuries to your feet that could lead to infections or more serious problems. Taking care of your feet is one of the most important things you can do for yourself.  HOME CARE INSTRUCTIONS  Wear shoes at all times, even in the house. Do not go barefoot. Bare feet are easily injured.  Check your feet daily for blisters, cuts, and redness. If you cannot see the bottom of your feet, use a mirror or ask someone for help.  Wash your feet with warm water (do not use hot water) and mild soap. Then pat your feet and the areas between your toes until they are completely dry. Do not soak your feet as this can dry your skin.  Apply a moisturizing lotion or petroleum jelly (that does not contain alcohol and is unscented) to the skin on your feet and to dry, brittle toenails. Do not apply lotion between your toes.  Trim your toenails straight across. Do not dig under them or around the cuticle. File the edges of your nails with an emery board or nail file.  Do not cut corns or calluses or try to remove them with medicine.  Wear clean socks or stockings every day. Make sure they are not too tight. Do not wear knee-high stockings since they may decrease blood flow to your legs.  Wear shoes that fit properly and have enough cushioning. To break in new shoes, wear them for just a few hours a day. This prevents you from injuring your feet. Always look in your shoes before you put them on to be sure there are no  objects inside.  Do not cross your legs. This may decrease the blood flow to your feet.  If you find a minor scrape, cut, or break in the skin on your feet, keep it and the skin around it clean and dry. These areas may be cleansed with mild soap and water. Do not cleanse the area with peroxide, alcohol, or iodine.  When you remove an adhesive bandage, be sure not to damage the skin around it.  If you have a wound, look at it several times a day to make sure it is healing.  Do not use heating pads or hot water bottles. They may burn your skin. If you have lost feeling in your feet or legs, you may not know it is happening until it is too late.  Make sure your health care provider performs a complete foot exam at least annually or more often if you have foot problems. Report any cuts, sores, or bruises to your health care provider immediately. SEEK MEDICAL CARE IF:   You have an injury that is not healing.  You have cuts or breaks in the skin.  You have an ingrown nail.  You notice redness on your legs or feet.  You feel burning or tingling in your legs or feet.  You have pain or cramps in your legs and feet.  Your legs  or feet are numb.  Your feet always feel cold. SEEK IMMEDIATE MEDICAL CARE IF:   There is increasing redness, swelling, or pain in or around a wound.  There is a red line that goes up your leg.  Pus is coming from a wound.  You develop a fever or as directed by your health care provider.  You notice a bad smell coming from an ulcer or wound.   This information is not intended to replace advice given to you by your health care provider. Make sure you discuss any questions you have with your health care provider.   Document Released: 03/08/2000 Document Revised: 11/11/2012 Document Reviewed: 08/18/2012 Elsevier Interactive Patient Education [employer].

## 2015-01-10 NOTE — Progress Notes (Signed)
Patient ID: DEYA BIGOS, female   DOB: 1929/09/07, 81 y.o.   MRN: [id number]  Subjective: This patient presents for scheduled visit complaining of painful toenails and walking wearing shoes and a painful plantar callus on the right foot  Objective: Orientated 3 The toenails are elongated, hypertrophic, discolored, incurvated and tender to direct palpation 6-10 Plantar callus sub-first right MPJ  Assessment: No open skin lesions by 1 Symptomatic onychomycoses 6-10 Plantar keratoses 1 Diabetic  Plan: Debridement toenails 10 mechanically and electronically with slight bleeding distal fourth left toe. Antibiotic ointment and dressing applied. Patient advised to leave Band-Aid on 1-2 days and then apply topical antibiotic ointment to the scab forms Debride plantar callus 1 right without any bleeding  Appoint 3 months

## 2015-01-11 ENCOUNTER — Ambulatory Visit: Payer: Self-pay | Admitting: Podiatry

## 2015-01-11 DIAGNOSIS — R413 Other amnesia: Secondary | ICD-10-CM | POA: Diagnosis not present

## 2015-01-12 DIAGNOSIS — R413 Other amnesia: Secondary | ICD-10-CM | POA: Diagnosis not present

## 2015-01-19 NOTE — Telephone Encounter (Signed)
Error

## 2015-01-25 DIAGNOSIS — F0390 Unspecified dementia without behavioral disturbance: Secondary | ICD-10-CM | POA: Diagnosis not present

## 2015-02-06 ENCOUNTER — Telehealth: Payer: Self-pay

## 2015-02-06 DIAGNOSIS — R4189 Other symptoms and signs involving cognitive functions and awareness: Secondary | ICD-10-CM

## 2015-02-06 DIAGNOSIS — R4689 Other symptoms and signs involving appearance and behavior: Principal | ICD-10-CM

## 2015-02-06 NOTE — Telephone Encounter (Signed)
Daughter wanted to know what the next steps were after having completed the neuropsychological testing at [facility]. Report was requested from [place].

## 2015-02-08 MED ORDER — DONEPEZIL HCL 5 MG PO TABS: 5.0000 mg | ORAL_TABLET | Freq: Every day | ORAL | Status: DC

## 2015-02-08 NOTE — Addendum Note (Signed)
Addended by: Gerda Diss A on: 02/08/2015 01:25 PM   Modules accepted: Orders

## 2015-02-08 NOTE — Telephone Encounter (Signed)
MRI scheduled @ [facility] for 11/30 @ 9 am.

## 2015-02-08 NOTE — Telephone Encounter (Signed)
Daughter, [name], aware. Medication sent in. MRI ordered.

## 2015-02-08 NOTE — Telephone Encounter (Signed)
I would have liked her to come in for another visit to discuss further.  But anyway, we will start donepezil (Aricept) 5mg  daily for four weeks.  In 4 weeks, she is to call us for a refill.  If she is tolerating the medication, then after four weeks, we will increase the dose to 10mg  daily.  Side effects include nausea, vomiting, diarrhea, vivid dreams, and muscle cramps. It was also recommended to repeat MRI of brain to re-evaluate for any changes.  She should follow up in 6 months for re-evaluation.

## 2015-02-08 NOTE — Telephone Encounter (Signed)
Report placed in provider's inbox yesterday.

## 2015-02-13 ENCOUNTER — Telehealth: Payer: Self-pay | Admitting: Neurology

## 2015-02-13 NOTE — Telephone Encounter (Signed)
PT called in regards to her medication Donepezil/Dawn CB# [phone]

## 2015-02-13 NOTE — Telephone Encounter (Signed)
We can continue it

## 2015-02-13 NOTE — Telephone Encounter (Signed)
Pt called to ask if medication is safe for her. She was reading insert from medication and it said people with h/x of irregular heart beat should let provider know. Pt states she does have an irregular heart beat. Please advise.

## 2015-02-13 NOTE — Telephone Encounter (Signed)
Message relayed to patient. Verbalized understanding and denied questions.   

## 2015-02-22 ENCOUNTER — Ambulatory Visit (HOSPITAL_COMMUNITY)
Admission: RE | Admit: 2015-02-22 | Discharge: 2015-02-22 | Disposition: A | Discharge status: Home / Self Care | Payer: Medicare Other | Source: Ambulatory Visit | Attending: Neurology | Admitting: Neurology

## 2015-02-22 DIAGNOSIS — G319 Degenerative disease of nervous system, unspecified: Secondary | ICD-10-CM | POA: Insufficient documentation

## 2015-02-22 DIAGNOSIS — I619 Nontraumatic intracerebral hemorrhage, unspecified: Secondary | ICD-10-CM | POA: Insufficient documentation

## 2015-02-22 DIAGNOSIS — R4189 Other symptoms and signs involving cognitive functions and awareness: Secondary | ICD-10-CM | POA: Insufficient documentation

## 2015-02-22 DIAGNOSIS — I6782 Cerebral ischemia: Secondary | ICD-10-CM | POA: Diagnosis not present

## 2015-02-22 DIAGNOSIS — F919 Conduct disorder, unspecified: Secondary | ICD-10-CM | POA: Insufficient documentation

## 2015-02-22 DIAGNOSIS — R4689 Other symptoms and signs involving appearance and behavior: Secondary | ICD-10-CM

## 2015-02-23 ENCOUNTER — Encounter: Payer: Self-pay | Admitting: Neurology

## 2015-02-24 ENCOUNTER — Telehealth: Payer: Self-pay | Admitting: Neurology

## 2015-02-24 NOTE — Telephone Encounter (Signed)
Please see response on MRI results

## 2015-02-24 NOTE — Telephone Encounter (Signed)
Pt/[name] /Daughter//called for MRI results/ call back @ [phone]

## 2015-02-24 NOTE — Telephone Encounter (Signed)
Results given. Neuropsych testing done. Schedule next available which is 06/22/15, placed on wait list as well.

## 2015-02-24 NOTE — Telephone Encounter (Signed)
-----   Message from Pieter Partridge, DO sent at 02/24/2015  1:43 PM EST ----- MRI looks stable compared to last year, specifically it shows cerebrovascular changes.  I would like to see her after the neuropsychological testing.

## 2015-03-06 ENCOUNTER — Other Ambulatory Visit: Payer: Self-pay | Admitting: Neurology

## 2015-03-15 DIAGNOSIS — T24201A Burn of second degree of unspecified site of right lower limb, except ankle and foot, initial encounter: Secondary | ICD-10-CM | POA: Diagnosis not present

## 2015-03-15 DIAGNOSIS — L03119 Cellulitis of unspecified part of limb: Secondary | ICD-10-CM | POA: Diagnosis not present

## 2015-04-11 DIAGNOSIS — I129 Hypertensive chronic kidney disease with stage 1 through stage 4 chronic kidney disease, or unspecified chronic kidney disease: Secondary | ICD-10-CM | POA: Diagnosis not present

## 2015-04-11 DIAGNOSIS — E785 Hyperlipidemia, unspecified: Secondary | ICD-10-CM | POA: Diagnosis not present

## 2015-04-11 DIAGNOSIS — R350 Frequency of micturition: Secondary | ICD-10-CM | POA: Diagnosis not present

## 2015-04-11 DIAGNOSIS — D638 Anemia in other chronic diseases classified elsewhere: Secondary | ICD-10-CM | POA: Diagnosis not present

## 2015-04-11 DIAGNOSIS — N183 Chronic kidney disease, stage 3 (moderate): Secondary | ICD-10-CM | POA: Diagnosis not present

## 2015-04-18 DIAGNOSIS — M6281 Muscle weakness (generalized): Secondary | ICD-10-CM | POA: Diagnosis not present

## 2015-04-18 DIAGNOSIS — M25561 Pain in right knee: Secondary | ICD-10-CM | POA: Diagnosis not present

## 2015-04-18 DIAGNOSIS — R278 Other lack of coordination: Secondary | ICD-10-CM | POA: Diagnosis not present

## 2015-04-18 DIAGNOSIS — R2681 Unsteadiness on feet: Secondary | ICD-10-CM | POA: Diagnosis not present

## 2015-04-18 DIAGNOSIS — R262 Difficulty in walking, not elsewhere classified: Secondary | ICD-10-CM | POA: Diagnosis not present

## 2015-04-20 DIAGNOSIS — R2681 Unsteadiness on feet: Secondary | ICD-10-CM | POA: Diagnosis not present

## 2015-04-20 DIAGNOSIS — R262 Difficulty in walking, not elsewhere classified: Secondary | ICD-10-CM | POA: Diagnosis not present

## 2015-04-20 DIAGNOSIS — R278 Other lack of coordination: Secondary | ICD-10-CM | POA: Diagnosis not present

## 2015-04-20 DIAGNOSIS — M6281 Muscle weakness (generalized): Secondary | ICD-10-CM | POA: Diagnosis not present

## 2015-04-20 DIAGNOSIS — M25561 Pain in right knee: Secondary | ICD-10-CM | POA: Diagnosis not present

## 2015-04-21 DIAGNOSIS — M25561 Pain in right knee: Secondary | ICD-10-CM | POA: Diagnosis not present

## 2015-04-21 DIAGNOSIS — R262 Difficulty in walking, not elsewhere classified: Secondary | ICD-10-CM | POA: Diagnosis not present

## 2015-04-21 DIAGNOSIS — M6281 Muscle weakness (generalized): Secondary | ICD-10-CM | POA: Diagnosis not present

## 2015-04-21 DIAGNOSIS — R2681 Unsteadiness on feet: Secondary | ICD-10-CM | POA: Diagnosis not present

## 2015-04-21 DIAGNOSIS — R278 Other lack of coordination: Secondary | ICD-10-CM | POA: Diagnosis not present

## 2015-04-24 DIAGNOSIS — M6281 Muscle weakness (generalized): Secondary | ICD-10-CM | POA: Diagnosis not present

## 2015-04-24 DIAGNOSIS — M25561 Pain in right knee: Secondary | ICD-10-CM | POA: Diagnosis not present

## 2015-04-24 DIAGNOSIS — R278 Other lack of coordination: Secondary | ICD-10-CM | POA: Diagnosis not present

## 2015-04-24 DIAGNOSIS — R2681 Unsteadiness on feet: Secondary | ICD-10-CM | POA: Diagnosis not present

## 2015-04-24 DIAGNOSIS — R262 Difficulty in walking, not elsewhere classified: Secondary | ICD-10-CM | POA: Diagnosis not present

## 2015-04-25 ENCOUNTER — Ambulatory Visit (INDEPENDENT_AMBULATORY_CARE_PROVIDER_SITE_OTHER): Payer: Medicare Other | Admitting: Podiatry

## 2015-04-25 ENCOUNTER — Encounter: Payer: Self-pay | Admitting: Podiatry

## 2015-04-25 DIAGNOSIS — R2681 Unsteadiness on feet: Secondary | ICD-10-CM | POA: Diagnosis not present

## 2015-04-25 DIAGNOSIS — M79676 Pain in unspecified toe(s): Secondary | ICD-10-CM | POA: Diagnosis not present

## 2015-04-25 DIAGNOSIS — R278 Other lack of coordination: Secondary | ICD-10-CM | POA: Diagnosis not present

## 2015-04-25 DIAGNOSIS — M6281 Muscle weakness (generalized): Secondary | ICD-10-CM | POA: Diagnosis not present

## 2015-04-25 DIAGNOSIS — L84 Corns and callosities: Secondary | ICD-10-CM

## 2015-04-25 DIAGNOSIS — B351 Tinea unguium: Secondary | ICD-10-CM | POA: Diagnosis not present

## 2015-04-25 DIAGNOSIS — M25561 Pain in right knee: Secondary | ICD-10-CM | POA: Diagnosis not present

## 2015-04-25 DIAGNOSIS — R262 Difficulty in walking, not elsewhere classified: Secondary | ICD-10-CM | POA: Diagnosis not present

## 2015-04-25 NOTE — Progress Notes (Signed)
Patient ID: [name], female   DOB: 07-03-1929, 82 y.o.   MRN: [id number]  Subjective: This patient presents today for scheduled visit complaining of painful and thickened toenails which from cough walking wearing shoes and a painful plantar callus on the right foot  Objective: No open skin lesions bilaterally The toenails are hypertrophic, elongated, incurvated, discolored, deformed and tender direct palpation 6-10 Plantar callus sub-first MPJ right  Assessment: Symptomatic onychomycoses 6-10 Plantar keratoses 1 Diabetic  Plan: Debridement toenails 6-10 mechanically and electronically without any bleeding Debride plantar keratoses right without any bleeding  Reappoint 3 month

## 2015-04-25 NOTE — Patient Instructions (Signed)
Diabetes and Foot Care Diabetes may cause you to have problems because of poor blood supply (circulation) to your feet and legs. This may cause the skin on your feet to become thinner, break easier, and heal more slowly. Your skin may become dry, and the skin may peel and crack. You may also have nerve damage in your legs and feet causing decreased feeling in them. You may not notice minor injuries to your feet that could lead to infections or more serious problems. Taking care of your feet is one of the most important things you can do for yourself.  HOME CARE INSTRUCTIONS  Wear shoes at all times, even in the house. Do not go barefoot. Bare feet are easily injured.  Check your feet daily for blisters, cuts, and redness. If you cannot see the bottom of your feet, use a mirror or ask someone for help.  Wash your feet with warm water (do not use hot water) and mild soap. Then pat your feet and the areas between your toes until they are completely dry. Do not soak your feet as this can dry your skin.  Apply a moisturizing lotion or petroleum jelly (that does not contain alcohol and is unscented) to the skin on your feet and to dry, brittle toenails. Do not apply lotion between your toes.  Trim your toenails straight across. Do not dig under them or around the cuticle. File the edges of your nails with an emery board or nail file.  Do not cut corns or calluses or try to remove them with medicine.  Wear clean socks or stockings every day. Make sure they are not too tight. Do not wear knee-high stockings since they may decrease blood flow to your legs.  Wear shoes that fit properly and have enough cushioning. To break in new shoes, wear them for just a few hours a day. This prevents you from injuring your feet. Always look in your shoes before you put them on to be sure there are no objects inside.  Do not cross your legs. This may decrease the blood flow to your feet.  If you find a minor scrape,  cut, or break in the skin on your feet, keep it and the skin around it clean and dry. These areas may be cleansed with mild soap and water. Do not cleanse the area with peroxide, alcohol, or iodine.  When you remove an adhesive bandage, be sure not to damage the skin around it.  If you have a wound, look at it several times a day to make sure it is healing.  Do not use heating pads or hot water bottles. They may burn your skin. If you have lost feeling in your feet or legs, you may not know it is happening until it is too late.  Make sure your health care provider performs a complete foot exam at least annually or more often if you have foot problems. Report any cuts, sores, or bruises to your health care provider immediately. SEEK MEDICAL CARE IF:   You have an injury that is not healing.  You have cuts or breaks in the skin.  You have an ingrown nail.  You notice redness on your legs or feet.  You feel burning or tingling in your legs or feet.  You have pain or cramps in your legs and feet.  Your legs or feet are numb.  Your feet always feel cold. SEEK IMMEDIATE MEDICAL CARE IF:   There is increasing redness,   swelling, or pain in or around a wound.  There is a red line that goes up your leg.  Pus is coming from a wound.  You develop a fever or as directed by your health care provider.  You notice a bad smell coming from an ulcer or wound.   This information is not intended to replace advice given to you by your health care provider. Make sure you discuss any questions you have with your health care provider.   Document Released: 03/08/2000 Document Revised: 11/11/2012 Document Reviewed: 08/18/2012 Elsevier Interactive Patient Education 2016 [employer]  

## 2015-04-27 DIAGNOSIS — R2681 Unsteadiness on feet: Secondary | ICD-10-CM | POA: Diagnosis not present

## 2015-04-27 DIAGNOSIS — R262 Difficulty in walking, not elsewhere classified: Secondary | ICD-10-CM | POA: Diagnosis not present

## 2015-04-27 DIAGNOSIS — R41841 Cognitive communication deficit: Secondary | ICD-10-CM | POA: Diagnosis not present

## 2015-04-27 DIAGNOSIS — M25561 Pain in right knee: Secondary | ICD-10-CM | POA: Diagnosis not present

## 2015-04-27 DIAGNOSIS — R278 Other lack of coordination: Secondary | ICD-10-CM | POA: Diagnosis not present

## 2015-04-27 DIAGNOSIS — M6281 Muscle weakness (generalized): Secondary | ICD-10-CM | POA: Diagnosis not present

## 2015-05-01 ENCOUNTER — Other Ambulatory Visit: Payer: Self-pay | Admitting: Neurology

## 2015-05-01 DIAGNOSIS — M6281 Muscle weakness (generalized): Secondary | ICD-10-CM | POA: Diagnosis not present

## 2015-05-01 DIAGNOSIS — R41841 Cognitive communication deficit: Secondary | ICD-10-CM | POA: Diagnosis not present

## 2015-05-01 DIAGNOSIS — R278 Other lack of coordination: Secondary | ICD-10-CM | POA: Diagnosis not present

## 2015-05-01 DIAGNOSIS — M25561 Pain in right knee: Secondary | ICD-10-CM | POA: Diagnosis not present

## 2015-05-01 DIAGNOSIS — R2681 Unsteadiness on feet: Secondary | ICD-10-CM | POA: Diagnosis not present

## 2015-05-01 DIAGNOSIS — R262 Difficulty in walking, not elsewhere classified: Secondary | ICD-10-CM | POA: Diagnosis not present

## 2015-05-01 NOTE — Telephone Encounter (Signed)
Last OV: 8/8/163 Next OV: 06/22/15

## 2015-05-02 DIAGNOSIS — M25561 Pain in right knee: Secondary | ICD-10-CM | POA: Diagnosis not present

## 2015-05-02 DIAGNOSIS — R2681 Unsteadiness on feet: Secondary | ICD-10-CM | POA: Diagnosis not present

## 2015-05-02 DIAGNOSIS — M6281 Muscle weakness (generalized): Secondary | ICD-10-CM | POA: Diagnosis not present

## 2015-05-02 DIAGNOSIS — R262 Difficulty in walking, not elsewhere classified: Secondary | ICD-10-CM | POA: Diagnosis not present

## 2015-05-02 DIAGNOSIS — R41841 Cognitive communication deficit: Secondary | ICD-10-CM | POA: Diagnosis not present

## 2015-05-02 DIAGNOSIS — R278 Other lack of coordination: Secondary | ICD-10-CM | POA: Diagnosis not present

## 2015-05-04 DIAGNOSIS — R278 Other lack of coordination: Secondary | ICD-10-CM | POA: Diagnosis not present

## 2015-05-04 DIAGNOSIS — R262 Difficulty in walking, not elsewhere classified: Secondary | ICD-10-CM | POA: Diagnosis not present

## 2015-05-04 DIAGNOSIS — R41841 Cognitive communication deficit: Secondary | ICD-10-CM | POA: Diagnosis not present

## 2015-05-04 DIAGNOSIS — M6281 Muscle weakness (generalized): Secondary | ICD-10-CM | POA: Diagnosis not present

## 2015-05-04 DIAGNOSIS — M25561 Pain in right knee: Secondary | ICD-10-CM | POA: Diagnosis not present

## 2015-05-04 DIAGNOSIS — R2681 Unsteadiness on feet: Secondary | ICD-10-CM | POA: Diagnosis not present

## 2015-05-08 DIAGNOSIS — R531 Weakness: Secondary | ICD-10-CM | POA: Diagnosis not present

## 2015-05-08 DIAGNOSIS — S9031XA Contusion of right foot, initial encounter: Secondary | ICD-10-CM | POA: Diagnosis not present

## 2015-05-08 DIAGNOSIS — I959 Hypotension, unspecified: Secondary | ICD-10-CM | POA: Diagnosis not present

## 2015-05-09 ENCOUNTER — Other Ambulatory Visit: Payer: Self-pay | Admitting: Family Medicine

## 2015-05-09 ENCOUNTER — Ambulatory Visit
Admission: RE | Admit: 2015-05-09 | Discharge: 2015-05-09 | Disposition: A | Discharge status: Home / Self Care | Payer: Medicare Other | Source: Ambulatory Visit | Attending: Family Medicine | Admitting: Family Medicine

## 2015-05-09 DIAGNOSIS — S9031XA Contusion of right foot, initial encounter: Secondary | ICD-10-CM

## 2015-05-09 DIAGNOSIS — R278 Other lack of coordination: Secondary | ICD-10-CM | POA: Diagnosis not present

## 2015-05-09 DIAGNOSIS — M79671 Pain in right foot: Secondary | ICD-10-CM | POA: Diagnosis not present

## 2015-05-09 DIAGNOSIS — R262 Difficulty in walking, not elsewhere classified: Secondary | ICD-10-CM | POA: Diagnosis not present

## 2015-05-09 DIAGNOSIS — R2681 Unsteadiness on feet: Secondary | ICD-10-CM | POA: Diagnosis not present

## 2015-05-09 DIAGNOSIS — M6281 Muscle weakness (generalized): Secondary | ICD-10-CM | POA: Diagnosis not present

## 2015-05-09 DIAGNOSIS — R41841 Cognitive communication deficit: Secondary | ICD-10-CM | POA: Diagnosis not present

## 2015-05-09 DIAGNOSIS — M25561 Pain in right knee: Secondary | ICD-10-CM | POA: Diagnosis not present

## 2015-05-10 ENCOUNTER — Encounter (HOSPITAL_COMMUNITY): Payer: Self-pay | Admitting: Emergency Medicine

## 2015-05-10 ENCOUNTER — Emergency Department (HOSPITAL_COMMUNITY): Payer: Medicare Other

## 2015-05-10 ENCOUNTER — Emergency Department (HOSPITAL_COMMUNITY)
Admission: EM | Admit: 2015-05-10 | Discharge: 2015-05-10 | Disposition: A | Discharge status: Home / Self Care | Payer: Medicare Other | Attending: Emergency Medicine | Admitting: Emergency Medicine

## 2015-05-10 DIAGNOSIS — S0181XA Laceration without foreign body of other part of head, initial encounter: Secondary | ICD-10-CM | POA: Diagnosis not present

## 2015-05-10 DIAGNOSIS — I4891 Unspecified atrial fibrillation: Secondary | ICD-10-CM | POA: Insufficient documentation

## 2015-05-10 DIAGNOSIS — G43909 Migraine, unspecified, not intractable, without status migrainosus: Secondary | ICD-10-CM | POA: Insufficient documentation

## 2015-05-10 DIAGNOSIS — E559 Vitamin D deficiency, unspecified: Secondary | ICD-10-CM | POA: Insufficient documentation

## 2015-05-10 DIAGNOSIS — Y9389 Activity, other specified: Secondary | ICD-10-CM | POA: Diagnosis not present

## 2015-05-10 DIAGNOSIS — S0990XA Unspecified injury of head, initial encounter: Secondary | ICD-10-CM | POA: Diagnosis not present

## 2015-05-10 DIAGNOSIS — W01198A Fall on same level from slipping, tripping and stumbling with subsequent striking against other object, initial encounter: Secondary | ICD-10-CM | POA: Insufficient documentation

## 2015-05-10 DIAGNOSIS — Z8619 Personal history of other infectious and parasitic diseases: Secondary | ICD-10-CM | POA: Insufficient documentation

## 2015-05-10 DIAGNOSIS — M1990 Unspecified osteoarthritis, unspecified site: Secondary | ICD-10-CM | POA: Insufficient documentation

## 2015-05-10 DIAGNOSIS — Z8673 Personal history of transient ischemic attack (TIA), and cerebral infarction without residual deficits: Secondary | ICD-10-CM | POA: Diagnosis not present

## 2015-05-10 DIAGNOSIS — Y998 Other external cause status: Secondary | ICD-10-CM | POA: Diagnosis not present

## 2015-05-10 DIAGNOSIS — I129 Hypertensive chronic kidney disease with stage 1 through stage 4 chronic kidney disease, or unspecified chronic kidney disease: Secondary | ICD-10-CM | POA: Diagnosis not present

## 2015-05-10 DIAGNOSIS — S0101XA Laceration without foreign body of scalp, initial encounter: Secondary | ICD-10-CM | POA: Insufficient documentation

## 2015-05-10 DIAGNOSIS — E7800 Pure hypercholesterolemia, unspecified: Secondary | ICD-10-CM | POA: Diagnosis not present

## 2015-05-10 DIAGNOSIS — E039 Hypothyroidism, unspecified: Secondary | ICD-10-CM | POA: Insufficient documentation

## 2015-05-10 DIAGNOSIS — Z79899 Other long term (current) drug therapy: Secondary | ICD-10-CM | POA: Insufficient documentation

## 2015-05-10 DIAGNOSIS — Z86718 Personal history of other venous thrombosis and embolism: Secondary | ICD-10-CM | POA: Insufficient documentation

## 2015-05-10 DIAGNOSIS — Z9889 Other specified postprocedural states: Secondary | ICD-10-CM | POA: Diagnosis not present

## 2015-05-10 DIAGNOSIS — S098XXA Other specified injuries of head, initial encounter: Secondary | ICD-10-CM | POA: Diagnosis not present

## 2015-05-10 DIAGNOSIS — M810 Age-related osteoporosis without current pathological fracture: Secondary | ICD-10-CM | POA: Insufficient documentation

## 2015-05-10 DIAGNOSIS — N183 Chronic kidney disease, stage 3 (moderate): Secondary | ICD-10-CM | POA: Diagnosis not present

## 2015-05-10 DIAGNOSIS — Z880 Allergy status to penicillin: Secondary | ICD-10-CM | POA: Insufficient documentation

## 2015-05-10 DIAGNOSIS — Y9289 Other specified places as the place of occurrence of the external cause: Secondary | ICD-10-CM | POA: Insufficient documentation

## 2015-05-10 DIAGNOSIS — F419 Anxiety disorder, unspecified: Secondary | ICD-10-CM | POA: Insufficient documentation

## 2015-05-10 DIAGNOSIS — K219 Gastro-esophageal reflux disease without esophagitis: Secondary | ICD-10-CM | POA: Diagnosis not present

## 2015-05-10 DIAGNOSIS — H409 Unspecified glaucoma: Secondary | ICD-10-CM | POA: Diagnosis not present

## 2015-05-10 DIAGNOSIS — Z86711 Personal history of pulmonary embolism: Secondary | ICD-10-CM | POA: Insufficient documentation

## 2015-05-10 DIAGNOSIS — M542 Cervicalgia: Secondary | ICD-10-CM | POA: Diagnosis not present

## 2015-05-10 DIAGNOSIS — F0390 Unspecified dementia without behavioral disturbance: Secondary | ICD-10-CM | POA: Diagnosis not present

## 2015-05-10 DIAGNOSIS — M25461 Effusion, right knee: Secondary | ICD-10-CM | POA: Diagnosis not present

## 2015-05-10 DIAGNOSIS — Z7901 Long term (current) use of anticoagulants: Secondary | ICD-10-CM | POA: Diagnosis not present

## 2015-05-10 DIAGNOSIS — W19XXXA Unspecified fall, initial encounter: Secondary | ICD-10-CM

## 2015-05-10 MED ORDER — LIDOCAINE-EPINEPHRINE (PF) 2 %-1:200000 IJ SOLN
20.0000 mL | Freq: Once | INTRAMUSCULAR | Status: AC
  Administered 2015-05-10: 20 mL
  Filled 2015-05-10: qty 20

## 2015-05-10 MED ORDER — ACETAMINOPHEN 500 MG PO TABS
1000.0000 mg | ORAL_TABLET | Freq: Once | ORAL | Status: AC
  Administered 2015-05-10: 1000 mg via ORAL
  Filled 2015-05-10: qty 2

## 2015-05-10 NOTE — ED Provider Notes (Signed)
CSN: [id number]     Arrival date & time 05/10/15  [id number] History   First MD Initiated Contact with Patient 05/10/15 [phone]     Chief Complaint  Patient presents with  . Fall     (Consider location/radiation/quality/duration/timing/severity/associated sxs/prior Treatment) The history is provided by the patient.     Pt on lovenox presents after mechanical fall.  States she got up from bed to go to the bathroom, took off too quickly without her cane and got her foot caught on a piece of furniture.  [place] forward hitting her head on either the bathroom door or the wall.  Denies syncope, dizziness, confusion after the event.  Reports mild headache, right knee pain, large amount of blood around her head.  Denies any other pain or injury.    Past Medical History  Diagnosis Date  . Shingles     zoster 1990 on the back and on the abdomen in 2007,facial zoster 2006  . Hypertension   . A-fib ([place])   . Glaucoma   . Pulmonary embolism recurrent     IVC filter  . Acid reflux   . Arthritis   . Migraine   . Postherpetic trigeminal neuralgia 10/20/2012  . Unspecified cerebral artery occlusion with cerebral infarction   . Memory loss   . Chronic anticoagulation 03/07/2013    Lovenox 100 QD  . Junctional bradycardia     resolved with discontinuation of sotalol (2014) after 10 yrs  . DVT (deep venous thrombosis) ([place]) 2007    recurrent w PE s/p [place] filter  . Hypoglycemia   . GERD (gastroesophageal reflux disease)     w hiatal hernia endoscopy 2003, Dr Penelope Coop  . Diverticulosis   . Phlebitis   . Osteoarthritis     in Bilateral knees and back. -Dr Latanya Maudlin  . Panic attacks   . Anxiety   . IBS (irritable bowel syndrome)   . Hypothyroidism     goiter  . Vitamin D deficiency   . CVA (cerebral infarction) 1964  . Osteoporosis   . Trigeminal neuralgia     r face--Dr Dohmeier  . Hypercholesterolemia   . Chronic kidney disease     Stage III  . Dementia     Dr Brett Fairy   Past Surgical  History  Procedure Laterality Date  . Blood clots  06/2006    lower abdomen  . Pulmonary embolism surgery  03/2005  . Phlebitis  12/2005  . Cholecystectomy  01/1983  . Biopsies of breast Bilateral 1971  . Migraine speech impairment  07/1963  . Hemorrhoid surgery  1966  . Appendectomy  1945  . Tonsillectomy      1954  . Gallbladder surgery  1980  . Insertion of vena cova filter  2007  . Leg / ankle soft tissue biopsy Right     precancerous  . Cardiac catheterization  08/2001    A-fib/Palpitations Dr. Daneen Schick  . Dilation and curettage, diagnostic / therapeutic  1952/1977    x2  . Foot surgery Right 1978    Cyst removed from Right Foot   Family History  Problem Relation Age of Onset  . Diabetes Mother   . Hypertension Mother   . Glaucoma Mother   . CVA Mother   . CAD Mother   . Hypertension Father   . Heart attack Father   . Kidney failure Father   . Heart attack Brother     2 half brothers  . Hypertension Brother   . CVA Brother   .  Glaucoma Other   . Glaucoma Other   . Breast cancer Maternal Aunt   . Colon cancer Maternal Uncle   . Rectal cancer Maternal Uncle   . Prostate cancer Maternal Grandfather    Social History  Substance Use Topics  . Smoking status: Never Smoker   . Smokeless tobacco: Never Used  . Alcohol Use: No     Comment: very moderate, none  in the last two months   OB History    No data available     Review of Systems  All other systems reviewed and are negative.     Allergies  Benadryl; Eye drops; Flu virus vaccine; Iohexol; Shrimp; Voltaren; Ambien; Gluten meal; Travatan z; Ativan; Hydrocodone; Betadine antibiotic-moisturize; Biaxin; Celebrex; Ciprofloxacin hcl; Combigan; Coumadin; Ivp dye; Levaquin; Naprosyn; Pataday; Penicillins; Plavix; and Septra  Home Medications   Prior to Admission medications   Medication Sig Start Date End Date Taking? Authorizing Provider  amLODipine (NORVASC) 2.5 MG tablet TAKE 1 TABLET BY MOUTH EVERY  DAY 01/09/15  Yes Belva Crome, MD  CARBATROL 100 MG 12 hr capsule TAKE 3 CAPSULES BY MOUTH EVERY MORNING, 3 caps AT NOON, & 3 caps AT BEDTIME. TAKE AFTER MEALS. 11/07/14  Yes Adam Telford Nab, DO  donepezil (ARICEPT) 5 MG tablet Take 1 tablet (5 mg total) by mouth at bedtime. 05/01/15  Yes Adam Telford Nab, DO  enoxaparin (LOVENOX) 100 MG/ML injection Inject 100 mg into the skin at bedtime.    Yes Historical Provider, MD  EPINEPHrine (EPIPEN IJ) Inject as directed once as needed. For allergic reactions   Yes Historical Provider, MD  Fe Fum-FA-B Cmp-C-Zn-Mg-Mn-Cu (HEMOCYTE PLUS) 106-1 MG CAPS Take 1 capsule by mouth daily.    Yes Historical Provider, MD  fluticasone (FLONASE) 50 MCG/ACT nasal spray Place 1 spray into left nostril daily as needed for allergies or rhinitis.  06/27/14  Yes Historical Provider, MD  folic acid (FOLVITE) 1 MG tablet Take 1 mg by mouth every morning.    Yes Historical Provider, MD  gabapentin (NEURONTIN) 300 MG capsule Take 1 capsule (300 mg total) by mouth 3 (three) times daily. 08/31/14  Yes Adam R Jaffe, DO  [name] 300 MG TABS TAKE 1 TABLET BY MOUTH EVERY MORNING, 1 TABLET AT NOON, AND 1 TABLET AT BEDTIME 05/01/15  Yes Pieter Partridge, DO  HYDROcodone-acetaminophen (NORCO/VICODIN) 5-325 MG per tablet Take 1 tablet by mouth every 6 (six) hours as needed for moderate pain.   Yes Historical Provider, MD  levothyroxine (SYNTHROID, LEVOTHROID) 75 MCG tablet Take 75 mcg by mouth daily before breakfast.    Yes Historical Provider, MD  loratadine (CLARITIN) 10 MG tablet Take 10 mg by mouth daily.   Yes Historical Provider, MD  meclizine (ANTIVERT) 25 MG tablet Take 25 mg by mouth 2 (two) times daily as needed for dizziness or nausea.  02/18/14  Yes Historical Provider, MD  Multiple Vitamin (MULTIVITAMIN) tablet Take 1 tablet by mouth every morning.    Yes Historical Provider, MD  pravastatin (PRAVACHOL) 40 MG tablet Take 40 mg by mouth every morning.    Yes Historical Provider, MD  RABEprazole  (ACIPHEX) 20 MG tablet Take 20 mg by mouth every morning.    Yes Historical Provider, MD  sodium chloride 1 G tablet Take 1 tablet (1 g total) by mouth 2 (two) times daily with a meal. 08/07/13  Yes Donne Hazel, MD  Travoprost, BAK Free, (TRAVATAN) 0.004 % SOLN ophthalmic solution Place 1 drop into both eyes at  bedtime.   Yes Historical Provider, MD  Vitamin D, Ergocalciferol, (DRISDOL) 50000 UNITS CAPS capsule Take 50,000 Units by mouth every Saturday.    Yes Historical Provider, MD   BP 170/82 mmHg  Pulse 78  Temp(Src) 97.4 F (36.3 C) (Oral)  Resp 18  SpO2 95% Physical Exam  Constitutional: She appears well-developed and well-nourished. No distress.  HENT:  Head: Normocephalic.    Large thick area of matted red blood.  Scalp laceration, approximately 12 cm, left frontal scalp.   Eyes: Conjunctivae are normal.  Neck: Neck supple.  Cardiovascular: Normal rate.   Pulmonary/Chest: Effort normal.  Abdominal: Soft. She exhibits no distension. There is no tenderness. There is no rebound and no guarding.  Musculoskeletal: Normal range of motion. She exhibits no tenderness.  Neurological: She is alert. She has normal strength. No cranial nerve deficit or sensory deficit. She exhibits normal muscle tone. GCS eye subscore is 4. GCS verbal subscore is 5. GCS motor subscore is 6.  Skin: She is not diaphoretic.  Nursing note and vitals reviewed.   ED Course  Procedures (including critical care time) Labs Review Labs Reviewed - No data to display  Imaging Review Ct Head Wo Contrast  05/10/2015  CLINICAL DATA:  Pt tripped over coat rack and hit left side of head. Pt has bleeding from left side of head. Pt denies headache. Pt has anterior neck pain. Pt takes blood thinner. EXAM: CT HEAD WITHOUT CONTRAST CT CERVICAL SPINE WITHOUT CONTRAST TECHNIQUE: Multidetector CT imaging of the head and cervical spine was performed following the standard protocol without intravenous contrast. Multiplanar CT  image reconstructions of the cervical spine were also generated. COMPARISON:  Head CT 12/03/2013 FINDINGS: CT HEAD FINDINGS Blood soaked bandage material anterior to the LEFT frontal bone and posterior to the occipital bone. Poorly imaged laceration over the vertex. No skull fracture identified. No intracranial hemorrhage. No parenchymal contusion. No midline shift or mass effect. Basilar cisterns are patent. No skull base fracture. No fluid in the paranasal sinuses or mastoid air cells. Orbits are normal. CT CERVICAL SPINE FINDINGS No prevertebral soft tissue swelling. Normal alignment of cervical vertebral bodies. No loss of vertebral body height. Normal facet articulation. Normal craniocervical junction. No evidence epidural or paraspinal hematoma. IMPRESSION: 1. No intracranial trauma. 2. Scalp laceration. 3. No skull fracture. 4. No cervical spine fracture. Electronically Signed   By: Suzy Bouchard M.D.   On: 05/10/2015 11:10   Ct Cervical Spine Wo Contrast  05/10/2015  CLINICAL DATA:  Pt tripped over coat rack and hit left side of head. Pt has bleeding from left side of head. Pt denies headache. Pt has anterior neck pain. Pt takes blood thinner. EXAM: CT HEAD WITHOUT CONTRAST CT CERVICAL SPINE WITHOUT CONTRAST TECHNIQUE: Multidetector CT imaging of the head and cervical spine was performed following the standard protocol without intravenous contrast. Multiplanar CT image reconstructions of the cervical spine were also generated. COMPARISON:  Head CT 12/03/2013 FINDINGS: CT HEAD FINDINGS Blood soaked bandage material anterior to the LEFT frontal bone and posterior to the occipital bone. Poorly imaged laceration over the vertex. No skull fracture identified. No intracranial hemorrhage. No parenchymal contusion. No midline shift or mass effect. Basilar cisterns are patent. No skull base fracture. No fluid in the paranasal sinuses or mastoid air cells. Orbits are normal. CT CERVICAL SPINE FINDINGS No  prevertebral soft tissue swelling. Normal alignment of cervical vertebral bodies. No loss of vertebral body height. Normal facet articulation. Normal craniocervical junction. No evidence epidural  or paraspinal hematoma. IMPRESSION: 1. No intracranial trauma. 2. Scalp laceration. 3. No skull fracture. 4. No cervical spine fracture. Electronically Signed   By: Suzy Bouchard M.D.   On: 05/10/2015 11:10   Dg Knee Complete 4 Views Right  05/10/2015  CLINICAL DATA:  Pain following fall EXAM: RIGHT KNEE - COMPLETE 4+ VIEW COMPARISON:  May 25, 2009 FINDINGS: Frontal, lateral, and bilateral oblique views were obtained. There is no demonstrable fracture or dislocation. There is a small joint effusion. There is generalized joint space narrowing, most severe laterally and in the patellofemoral joint. There has been progression of osteoarthritic change compared to the 2011 study. No erosive change. IMPRESSION: Progression of osteoarthritic change compared to prior study. Joint space narrowing is greatest in the lateral and patellofemoral joint regions. There is moderate spurring in these areas. There is a small joint effusion. There is no demonstrable fracture or dislocation. Electronically Signed   By: Lowella Grip III M.D.   On: 05/10/2015 11:13   Dg Foot Complete Right  05/09/2015  CLINICAL DATA:  Right foot injury 1 week ago, jar fell on top of the foot 1 week ago, metatarsal contusion, possible fracture EXAM: RIGHT FOOT COMPLETE - 3+ VIEW COMPARISON:  None. FINDINGS: Three views of the right foot submitted. No acute fracture or subluxation. There is diffuse osteopenia. Tiny plantar spur of calcaneus. Mild degenerative changes first metatarsal phalangeal joint. Mild degenerative changes distal aspect first metatarsal. IMPRESSION: Limited study by diffuse osteopenia. No acute fracture or subluxation. Mild degenerative changes. Tiny plantar spur of calcaneus. Electronically Signed   By: Lahoma Crocker M.D.   On:  05/09/2015 11:42     EKG Interpretation None       LACERATION REPAIR Performed by: Clayton Bibles Authorized by: Clayton Bibles Consent: Verbal consent obtained. Risks and benefits: risks, benefits and alternatives were discussed Consent given by: patient Patient identity confirmed: provided demographic data Prepped and Draped in normal sterile fashion Wound explored  Laceration Location: scalp  Laceration Length: 12cm  No Foreign Bodies seen or palpated  Anesthesia: local infiltration  Local anesthetic: lidocaine 2% with epinephrine  Anesthetic total: 6 ml  Irrigation method: syringe Amount of cleaning: extensive   Skin closure: staples  Number of sutures: 10  Technique: staples   Patient tolerance: Patient tolerated the procedure well with no immediate complications.   MDM   Final diagnoses:  Fall, initial encounter  Head injury, initial encounter  Scalp laceration, initial encounter    Afebrile, nontoxic patient with scalp laceration after mechanical fall.  She is on blood thinners.  Imaging is negative.  Laceration repaired in ED with continued hemostasis for monitoring period after repair.  She lives in independent living apartment that is a [employer]ist, [employer] apartment.  Daughter is able to stay with her tonight and will be able to do frequent neuro checks.  I spoke with patient and daughter regarding risk of delayed bleed due to head injury while on blood thinner.   D/C home with PCP/ED follow up.  Discussed result, findings, treatment, and follow up  with patient.  Pt given return precautions.  Pt verbalizes understanding and agrees with plan.         Clayton Bibles, PA-C 05/10/15 1604  Charlesetta Shanks, MD 05/10/15 [phone]

## 2015-05-10 NOTE — Discharge Instructions (Signed)
Read the information below.  You may return to the Emergency Department at any time for worsening condition or any new symptoms that concern you.  If you develop redness, swelling, pus draining from the wound, uncontrolled bleeding, or fevers greater than 100.4, return to the ER immediately for a recheck.  Please check on [name] every two hours for the next 24 hours.  If she has any worsening headache, change in her speech or gait, weakness or numbness of the arms or legs, or decreased ability to stay awake, call 911 immediately and return for a recheck.    You have had a head injury which does not appear to require admission at this time. A concussion is a state of changed mental ability from trauma. SEEK IMMEDIATE MEDICAL ATTENTION IF: There is confusion or drowsiness (although children frequently become drowsy after injury).  You cannot awaken the injured person.  There is nausea (feeling sick to your stomach) or continued, forceful vomiting.  You notice dizziness or unsteadiness which is getting worse, or inability to walk.  You have convulsions or unconsciousness.  You experience severe, persistent headaches not relieved by Tylenol?. (Do not take aspirin as this impairs clotting abilities). Take other pain medications only as directed.  You cannot use arms or legs normally.  There are changes in pupil sizes. (This is the black center in the colored part of the eye)  There is clear or bloody discharge from the nose or ears.  Change in speech, vision, swallowing, or understanding.  Localized weakness, numbness, tingling, or change in bowel or bladder control.     Laceration Care, Adult A laceration is a cut that goes through all of the layers of the skin and into the tissue that is right under the skin. Some lacerations heal on their own. Others need to be closed with stitches (sutures), staples, skin adhesive strips, or skin glue. Proper laceration care minimizes the risk of infection and  helps the laceration to heal better. HOW TO CARE FOR YOUR LACERATION If sutures or staples were used:  Keep the wound clean and dry.  If you were given a bandage (dressing), you should change it at least one time per day or as told by your health care provider. You should also change it if it becomes wet or dirty.  Keep the wound completely dry for the first 24 hours or as told by your health care provider. After that time, you may shower or bathe. However, make sure that the wound is not soaked in water until after the sutures or staples have been removed.  Clean the wound one time each day or as told by your health care provider:  Wash the wound with soap and water.  Rinse the wound with water to remove all soap.  Pat the wound dry with a clean towel. Do not rub the wound.  After cleaning the wound, apply a thin layer of antibiotic ointmentas told by your health care provider. This will help to prevent infection and keep the dressing from sticking to the wound.  Have the sutures or staples removed as told by your health care provider. If skin adhesive strips were used:  Keep the wound clean and dry.  If you were given a bandage (dressing), you should change it at least one time per day or as told by your health care provider. You should also change it if it becomes dirty or wet.  Do not get the skin adhesive strips wet. You may  shower or bathe, but be careful to keep the wound dry.  If the wound gets wet, pat it dry with a clean towel. Do not rub the wound.  Skin adhesive strips fall off on their own. You may trim the strips as the wound heals. Do not remove skin adhesive strips that are still stuck to the wound. They will fall off in time. If skin glue was used:  Try to keep the wound dry, but you may briefly wet it in the shower or bath. Do not soak the wound in water, such as by swimming.  After you have showered or bathed, gently pat the wound dry with a clean towel. Do not  rub the wound.  Do not do any activities that will make you sweat heavily until the skin glue has fallen off on its own.  Do not apply liquid, cream, or ointment medicine to the wound while the skin glue is in place. Using those may loosen the film before the wound has healed.  If you were given a bandage (dressing), you should change it at least one time per day or as told by your health care provider. You should also change it if it becomes dirty or wet.  If a dressing is placed over the wound, be careful not to apply tape directly over the skin glue. Doing that may cause the glue to be pulled off before the wound has healed.  Do not pick at the glue. The skin glue usually remains in place for 5-10 days, then it falls off of the skin. General Instructions  Take over-the-counter and prescription medicines only as told by your health care provider.  If you were prescribed an antibiotic medicine or ointment, take or apply it as told by your doctor. Do not stop using it even if your condition improves.  To help prevent scarring, make sure to cover your wound with sunscreen whenever you are outside after stitches are removed, after adhesive strips are removed, or when glue remains in place and the wound is healed. Make sure to wear a sunscreen of at least 30 SPF.  Do not scratch or pick at the wound.  Keep all follow-up visits as told by your health care provider. This is important.  Check your wound every day for signs of infection. Watch for:  Redness, swelling, or pain.  Fluid, blood, or pus.  Raise (elevate) the injured area above the level of your heart while you are sitting or lying down, if possible. SEEK MEDICAL CARE IF:  You received a tetanus shot and you have swelling, severe pain, redness, or bleeding at the injection site.  You have a fever.  A wound that was closed breaks open.  You notice a bad smell coming from your wound or your dressing.  You notice something  coming out of the wound, such as wood or glass.  Your pain is not controlled with medicine.  You have increased redness, swelling, or pain at the site of your wound.  You have fluid, blood, or pus coming from your wound.  You notice a change in the color of your skin near your wound.  You need to change the dressing frequently due to fluid, blood, or pus draining from the wound.  You develop a new rash.  You develop numbness around the wound. SEEK IMMEDIATE MEDICAL CARE IF:  You develop severe swelling around the wound.  Your pain suddenly increases and is severe.  You develop painful lumps near the  wound or on skin that is anywhere on your body.  You have a red streak going away from your wound.  The wound is on your hand or foot and you cannot properly move a finger or toe.  The wound is on your hand or foot and you notice that your fingers or toes look pale or bluish.   This information is not intended to replace advice given to you by your health care provider. Make sure you discuss any questions you have with your health care provider.   Document Released: 03/11/2005 Document Revised: 07/26/2014 Document Reviewed: 03/07/2014 Elsevier Interactive Patient Education 2016 [place], [place], or Adhesive Wound Closure Health care providers use stitches (sutures), staples, and certain glue (skin adhesives) to hold skin together while it heals (wound closure). You may need this treatment after you have surgery or if you cut your skin accidentally. These methods help your skin to heal more quickly and make it less likely that you will have a scar. A wound may take several months to heal completely. The type of wound you have determines when your wound gets closed. In most cases, the wound is closed as soon as possible (primary skin closure). Sometimes, closure is delayed so the wound can be cleaned and allowed to heal naturally. This reduces the chance of infection.  Delayed closure may be needed if your wound:  Is caused by a bite.  Happened more than 6 hours ago.  Involves loss of skin or the tissues under the skin.  Has dirt or debris in it that cannot be removed.  Is infected. WHAT ARE THE DIFFERENT KINDS OF WOUND CLOSURES? There are many options for wound closure. The one that your health care provider uses depends on how deep and how large your wound is. Adhesive Glue To use this type of glue to close a wound, your health care provider holds the edges of the wound together and paints the glue on the surface of your skin. You may need more than one layer of glue. Then the wound may be covered with a light bandage (dressing). This type of skin closure may be used for small wounds that are not deep (superficial). Using glue for wound closure is less painful than other methods. It does not require a medicine that numbs the area (local anesthetic). This method also leaves nothing to be removed. Adhesive glue is often used for children and on facial wounds. Adhesive glue cannot be used for wounds that are deep, uneven, or bleeding. It is not used inside of a wound.  Adhesive Strips These strips are made of sticky (adhesive), porous paper. They are applied across your skin edges like a regular adhesive bandage. You leave them on until they fall off. Adhesive strips may be used to close very superficial wounds. They may also be used along with sutures to improve the closure of your skin edges.  Sutures Sutures are the oldest method of wound closure. Sutures can be made from natural substances, such as silk, or from synthetic materials, such as nylon and steel. They can be made from a material that your body can break down as your wound heals (absorbable), or they can be made from a material that needs to be removed from your skin (nonabsorbable). They come in many different strengths and sizes. Your health care provider attaches the sutures to a steel needle  on one end. Sutures can be passed through your skin, or through the tissues beneath your skin.  Then they are tied and cut. Your skin edges may be closed in one continuous stitch or in separate stitches. Sutures are strong and can be used for all kinds of wounds. Absorbable sutures may be used to close tissues under the skin. The disadvantage of sutures is that they may cause skin reactions that lead to infection. Nonabsorbable sutures need to be removed. Staples When surgical staples are used to close a wound, the edges of your skin on both sides of the wound are brought close together. A staple is placed across the wound, and an instrument secures the edges together. Staples are often used to close surgical cuts (incisions). Staples are faster to use than sutures, and they cause less skin reaction. Staples need to be removed using a tool that bends the staples away from your skin. HOW DO I CARE FOR MY WOUND CLOSURE?  Take medicines only as directed by your health care provider.  If you were prescribed an antibiotic medicine for your wound, finish it all even if you start to feel better.  Use ointments or creams only as directed by your health care provider.  Wash your hands with soap and water before and after touching your wound.  Do not soak your wound in water. Do not take baths, swim, or use a hot tub until your health care provider approves.  Ask your health care provider when you can start showering. Cover your wound if directed by your health care provider.  Do not take out your own sutures or staples.  Do not pick at your wound. Picking can cause an infection.  Keep all follow-up visits as directed by your health care provider. This is important. HOW LONG WILL I HAVE MY WOUND CLOSURE?  Leave adhesive glue on your skin until the glue peels away.  Leave adhesive strips on your skin until the strips fall off.  Absorbable sutures will dissolve within several days.  Nonabsorbable  sutures and staples must be removed. The location of the wound will determine how long they stay in. This can range from several days to a couple of weeks. WHEN SHOULD I SEEK HELP FOR MY WOUND CLOSURE? Contact your health care provider if:  You have a fever.  You have chills.  You have drainage, redness, swelling, or pain at your wound.  There is a bad smell coming from your wound.  The skin edges of your wound start to separate after your sutures have been removed.  Your wound becomes thick, raised, and darker in color after your sutures come out (scarring).   This information is not intended to replace advice given to you by your health care provider. Make sure you discuss any questions you have with your health care provider.   Document Released: 12/04/2000 Document Revised: 04/01/2014 Document Reviewed: 08/18/2013 Elsevier Interactive Patient Education 2016 [place] Injury, Adult You have a head injury. Headaches and throwing up (vomiting) are common after a head injury. It should be easy to wake up from sleeping. Sometimes you must stay in the hospital. Most problems happen within the first 24 hours. Side effects may occur up to 7-10 days after the injury.  WHAT ARE THE TYPES OF HEAD INJURIES? Head injuries can be as minor as a bump. Some head injuries can be more severe. More severe head injuries include:  A jarring injury to the brain (concussion).  A bruise of the brain (contusion). This mean there is bleeding in the brain that can cause swelling.  A cracked skull (skull fracture).  Bleeding in the brain that collects, clots, and forms a bump (hematoma). WHEN SHOULD I GET HELP RIGHT AWAY?   You are confused or sleepy.  You cannot be woken up.  You feel sick to your stomach (nauseous) or keep throwing up (vomiting).  Your dizziness or unsteadiness is getting worse.  You have very bad, lasting headaches that are not helped by medicine. Take medicines only  as told by your doctor.  You cannot use your arms or legs like normal.  You cannot walk.  You notice changes in the black spots in the center of the colored part of your eye (pupil).  You have clear or bloody fluid coming from your nose or ears.  You have trouble seeing. During the next 24 hours after the injury, you must stay with someone who can watch you. This person should get help right away (call 911 in the [place] if you start to shake and are not able to control it (have seizures), you pass out, or you are unable to wake up. HOW CAN I PREVENT A HEAD INJURY IN THE FUTURE?  Wear seat belts.  Wear a helmet while bike riding and playing sports like football.  Stay away from dangerous activities around the house. WHEN CAN I RETURN TO NORMAL ACTIVITIES AND ATHLETICS? See your doctor before doing these activities. You should not do normal activities or play contact sports until 1 week after the following symptoms have stopped:  Headache that does not go away.  Dizziness.  Poor attention.  Confusion.  Memory problems.  Sickness to your stomach or throwing up.  Tiredness.  Fussiness.  Bothered by bright lights or loud noises.  Anxiousness or depression.  Restless sleep. MAKE SURE YOU:   Understand these instructions.  Will watch your condition.  Will get help right away if you are not doing well or get worse.   This information is not intended to replace advice given to you by your health care provider. Make sure you discuss any questions you have with your health care provider.   Document Released: 02/22/2008 Document Revised: 04/01/2014 Document Reviewed: 11/16/2012 Elsevier Interactive Patient Education [employer].

## 2015-05-10 NOTE — ED Notes (Signed)
Pt to ER via [facility] from [facility] (independent living); pt got up this morning with need to urinate, pt fell trying to get to restroom in a hurry. Hit left corner of head on corner of the wall, EMS reports 3 cm lac to forehead. No LOC. Now reporting nausea on arrival. Pt also complaining of right knee pain. VSS. 140/78, HR 75

## 2015-05-11 DIAGNOSIS — R41841 Cognitive communication deficit: Secondary | ICD-10-CM | POA: Diagnosis not present

## 2015-05-11 DIAGNOSIS — R262 Difficulty in walking, not elsewhere classified: Secondary | ICD-10-CM | POA: Diagnosis not present

## 2015-05-11 DIAGNOSIS — M6281 Muscle weakness (generalized): Secondary | ICD-10-CM | POA: Diagnosis not present

## 2015-05-11 DIAGNOSIS — R278 Other lack of coordination: Secondary | ICD-10-CM | POA: Diagnosis not present

## 2015-05-11 DIAGNOSIS — R2681 Unsteadiness on feet: Secondary | ICD-10-CM | POA: Diagnosis not present

## 2015-05-11 DIAGNOSIS — M25561 Pain in right knee: Secondary | ICD-10-CM | POA: Diagnosis not present

## 2015-05-16 DIAGNOSIS — R41841 Cognitive communication deficit: Secondary | ICD-10-CM | POA: Diagnosis not present

## 2015-05-16 DIAGNOSIS — R2681 Unsteadiness on feet: Secondary | ICD-10-CM | POA: Diagnosis not present

## 2015-05-16 DIAGNOSIS — R278 Other lack of coordination: Secondary | ICD-10-CM | POA: Diagnosis not present

## 2015-05-16 DIAGNOSIS — M6281 Muscle weakness (generalized): Secondary | ICD-10-CM | POA: Diagnosis not present

## 2015-05-16 DIAGNOSIS — M25561 Pain in right knee: Secondary | ICD-10-CM | POA: Diagnosis not present

## 2015-05-16 DIAGNOSIS — R262 Difficulty in walking, not elsewhere classified: Secondary | ICD-10-CM | POA: Diagnosis not present

## 2015-05-17 DIAGNOSIS — M25561 Pain in right knee: Secondary | ICD-10-CM | POA: Diagnosis not present

## 2015-05-17 DIAGNOSIS — R262 Difficulty in walking, not elsewhere classified: Secondary | ICD-10-CM | POA: Diagnosis not present

## 2015-05-17 DIAGNOSIS — M6281 Muscle weakness (generalized): Secondary | ICD-10-CM | POA: Diagnosis not present

## 2015-05-17 DIAGNOSIS — R2681 Unsteadiness on feet: Secondary | ICD-10-CM | POA: Diagnosis not present

## 2015-05-17 DIAGNOSIS — R278 Other lack of coordination: Secondary | ICD-10-CM | POA: Diagnosis not present

## 2015-05-17 DIAGNOSIS — R41841 Cognitive communication deficit: Secondary | ICD-10-CM | POA: Diagnosis not present

## 2015-05-18 DIAGNOSIS — R2681 Unsteadiness on feet: Secondary | ICD-10-CM | POA: Diagnosis not present

## 2015-05-18 DIAGNOSIS — R278 Other lack of coordination: Secondary | ICD-10-CM | POA: Diagnosis not present

## 2015-05-18 DIAGNOSIS — M25561 Pain in right knee: Secondary | ICD-10-CM | POA: Diagnosis not present

## 2015-05-18 DIAGNOSIS — R262 Difficulty in walking, not elsewhere classified: Secondary | ICD-10-CM | POA: Diagnosis not present

## 2015-05-18 DIAGNOSIS — R41841 Cognitive communication deficit: Secondary | ICD-10-CM | POA: Diagnosis not present

## 2015-05-18 DIAGNOSIS — M6281 Muscle weakness (generalized): Secondary | ICD-10-CM | POA: Diagnosis not present

## 2015-05-19 DIAGNOSIS — S8001XD Contusion of right knee, subsequent encounter: Secondary | ICD-10-CM | POA: Diagnosis not present

## 2015-05-19 DIAGNOSIS — S0101XD Laceration without foreign body of scalp, subsequent encounter: Secondary | ICD-10-CM | POA: Diagnosis not present

## 2015-05-19 DIAGNOSIS — W19XXXD Unspecified fall, subsequent encounter: Secondary | ICD-10-CM | POA: Diagnosis not present

## 2015-05-19 DIAGNOSIS — S0003XD Contusion of scalp, subsequent encounter: Secondary | ICD-10-CM | POA: Diagnosis not present

## 2015-05-19 DIAGNOSIS — Y92099 Unspecified place in other non-institutional residence as the place of occurrence of the external cause: Secondary | ICD-10-CM | POA: Diagnosis not present

## 2015-05-19 DIAGNOSIS — S9001XD Contusion of right ankle, subsequent encounter: Secondary | ICD-10-CM | POA: Diagnosis not present

## 2015-05-22 DIAGNOSIS — R41841 Cognitive communication deficit: Secondary | ICD-10-CM | POA: Diagnosis not present

## 2015-05-22 DIAGNOSIS — M25561 Pain in right knee: Secondary | ICD-10-CM | POA: Diagnosis not present

## 2015-05-22 DIAGNOSIS — R262 Difficulty in walking, not elsewhere classified: Secondary | ICD-10-CM | POA: Diagnosis not present

## 2015-05-22 DIAGNOSIS — R2681 Unsteadiness on feet: Secondary | ICD-10-CM | POA: Diagnosis not present

## 2015-05-22 DIAGNOSIS — R278 Other lack of coordination: Secondary | ICD-10-CM | POA: Diagnosis not present

## 2015-05-22 DIAGNOSIS — M6281 Muscle weakness (generalized): Secondary | ICD-10-CM | POA: Diagnosis not present

## 2015-05-23 DIAGNOSIS — M6281 Muscle weakness (generalized): Secondary | ICD-10-CM | POA: Diagnosis not present

## 2015-05-23 DIAGNOSIS — Z1231 Encounter for screening mammogram for malignant neoplasm of breast: Secondary | ICD-10-CM | POA: Diagnosis not present

## 2015-05-23 DIAGNOSIS — R278 Other lack of coordination: Secondary | ICD-10-CM | POA: Diagnosis not present

## 2015-05-23 DIAGNOSIS — R262 Difficulty in walking, not elsewhere classified: Secondary | ICD-10-CM | POA: Diagnosis not present

## 2015-05-23 DIAGNOSIS — R2681 Unsteadiness on feet: Secondary | ICD-10-CM | POA: Diagnosis not present

## 2015-05-23 DIAGNOSIS — R41841 Cognitive communication deficit: Secondary | ICD-10-CM | POA: Diagnosis not present

## 2015-05-23 DIAGNOSIS — Z803 Family history of malignant neoplasm of breast: Secondary | ICD-10-CM | POA: Diagnosis not present

## 2015-05-23 DIAGNOSIS — M25561 Pain in right knee: Secondary | ICD-10-CM | POA: Diagnosis not present

## 2015-05-25 DIAGNOSIS — R2681 Unsteadiness on feet: Secondary | ICD-10-CM | POA: Diagnosis not present

## 2015-05-25 DIAGNOSIS — R079 Chest pain, unspecified: Secondary | ICD-10-CM | POA: Diagnosis not present

## 2015-05-25 DIAGNOSIS — R262 Difficulty in walking, not elsewhere classified: Secondary | ICD-10-CM | POA: Diagnosis not present

## 2015-05-25 DIAGNOSIS — R41841 Cognitive communication deficit: Secondary | ICD-10-CM | POA: Diagnosis not present

## 2015-05-25 DIAGNOSIS — R319 Hematuria, unspecified: Secondary | ICD-10-CM | POA: Diagnosis not present

## 2015-05-25 DIAGNOSIS — R278 Other lack of coordination: Secondary | ICD-10-CM | POA: Diagnosis not present

## 2015-05-25 DIAGNOSIS — M6281 Muscle weakness (generalized): Secondary | ICD-10-CM | POA: Diagnosis not present

## 2015-05-25 DIAGNOSIS — S71119A Laceration without foreign body, unspecified thigh, initial encounter: Secondary | ICD-10-CM | POA: Diagnosis not present

## 2015-05-25 DIAGNOSIS — M25561 Pain in right knee: Secondary | ICD-10-CM | POA: Diagnosis not present

## 2015-05-26 DIAGNOSIS — R278 Other lack of coordination: Secondary | ICD-10-CM | POA: Diagnosis not present

## 2015-05-26 DIAGNOSIS — R41841 Cognitive communication deficit: Secondary | ICD-10-CM | POA: Diagnosis not present

## 2015-05-26 DIAGNOSIS — R262 Difficulty in walking, not elsewhere classified: Secondary | ICD-10-CM | POA: Diagnosis not present

## 2015-05-26 DIAGNOSIS — M25561 Pain in right knee: Secondary | ICD-10-CM | POA: Diagnosis not present

## 2015-05-26 DIAGNOSIS — R2681 Unsteadiness on feet: Secondary | ICD-10-CM | POA: Diagnosis not present

## 2015-05-26 DIAGNOSIS — M6281 Muscle weakness (generalized): Secondary | ICD-10-CM | POA: Diagnosis not present

## 2015-05-29 DIAGNOSIS — R41841 Cognitive communication deficit: Secondary | ICD-10-CM | POA: Diagnosis not present

## 2015-05-29 DIAGNOSIS — R2681 Unsteadiness on feet: Secondary | ICD-10-CM | POA: Diagnosis not present

## 2015-05-29 DIAGNOSIS — R278 Other lack of coordination: Secondary | ICD-10-CM | POA: Diagnosis not present

## 2015-05-29 DIAGNOSIS — R262 Difficulty in walking, not elsewhere classified: Secondary | ICD-10-CM | POA: Diagnosis not present

## 2015-05-29 DIAGNOSIS — M6281 Muscle weakness (generalized): Secondary | ICD-10-CM | POA: Diagnosis not present

## 2015-05-29 DIAGNOSIS — M25561 Pain in right knee: Secondary | ICD-10-CM | POA: Diagnosis not present

## 2015-05-30 DIAGNOSIS — R41841 Cognitive communication deficit: Secondary | ICD-10-CM | POA: Diagnosis not present

## 2015-05-30 DIAGNOSIS — R278 Other lack of coordination: Secondary | ICD-10-CM | POA: Diagnosis not present

## 2015-05-30 DIAGNOSIS — M25561 Pain in right knee: Secondary | ICD-10-CM | POA: Diagnosis not present

## 2015-05-30 DIAGNOSIS — R262 Difficulty in walking, not elsewhere classified: Secondary | ICD-10-CM | POA: Diagnosis not present

## 2015-05-30 DIAGNOSIS — R2681 Unsteadiness on feet: Secondary | ICD-10-CM | POA: Diagnosis not present

## 2015-05-30 DIAGNOSIS — M6281 Muscle weakness (generalized): Secondary | ICD-10-CM | POA: Diagnosis not present

## 2015-05-31 DIAGNOSIS — R41841 Cognitive communication deficit: Secondary | ICD-10-CM | POA: Diagnosis not present

## 2015-05-31 DIAGNOSIS — R2681 Unsteadiness on feet: Secondary | ICD-10-CM | POA: Diagnosis not present

## 2015-05-31 DIAGNOSIS — M6281 Muscle weakness (generalized): Secondary | ICD-10-CM | POA: Diagnosis not present

## 2015-05-31 DIAGNOSIS — R278 Other lack of coordination: Secondary | ICD-10-CM | POA: Diagnosis not present

## 2015-05-31 DIAGNOSIS — R262 Difficulty in walking, not elsewhere classified: Secondary | ICD-10-CM | POA: Diagnosis not present

## 2015-05-31 DIAGNOSIS — M25561 Pain in right knee: Secondary | ICD-10-CM | POA: Diagnosis not present

## 2015-06-02 DIAGNOSIS — R2681 Unsteadiness on feet: Secondary | ICD-10-CM | POA: Diagnosis not present

## 2015-06-02 DIAGNOSIS — M25561 Pain in right knee: Secondary | ICD-10-CM | POA: Diagnosis not present

## 2015-06-02 DIAGNOSIS — R278 Other lack of coordination: Secondary | ICD-10-CM | POA: Diagnosis not present

## 2015-06-02 DIAGNOSIS — R262 Difficulty in walking, not elsewhere classified: Secondary | ICD-10-CM | POA: Diagnosis not present

## 2015-06-02 DIAGNOSIS — M6281 Muscle weakness (generalized): Secondary | ICD-10-CM | POA: Diagnosis not present

## 2015-06-02 DIAGNOSIS — R41841 Cognitive communication deficit: Secondary | ICD-10-CM | POA: Diagnosis not present

## 2015-06-05 DIAGNOSIS — M25561 Pain in right knee: Secondary | ICD-10-CM | POA: Diagnosis not present

## 2015-06-05 DIAGNOSIS — R262 Difficulty in walking, not elsewhere classified: Secondary | ICD-10-CM | POA: Diagnosis not present

## 2015-06-05 DIAGNOSIS — M6281 Muscle weakness (generalized): Secondary | ICD-10-CM | POA: Diagnosis not present

## 2015-06-05 DIAGNOSIS — R278 Other lack of coordination: Secondary | ICD-10-CM | POA: Diagnosis not present

## 2015-06-05 DIAGNOSIS — R2681 Unsteadiness on feet: Secondary | ICD-10-CM | POA: Diagnosis not present

## 2015-06-05 DIAGNOSIS — R41841 Cognitive communication deficit: Secondary | ICD-10-CM | POA: Diagnosis not present

## 2015-06-06 DIAGNOSIS — R2681 Unsteadiness on feet: Secondary | ICD-10-CM | POA: Diagnosis not present

## 2015-06-06 DIAGNOSIS — M6281 Muscle weakness (generalized): Secondary | ICD-10-CM | POA: Diagnosis not present

## 2015-06-06 DIAGNOSIS — R262 Difficulty in walking, not elsewhere classified: Secondary | ICD-10-CM | POA: Diagnosis not present

## 2015-06-06 DIAGNOSIS — M25561 Pain in right knee: Secondary | ICD-10-CM | POA: Diagnosis not present

## 2015-06-06 DIAGNOSIS — R41841 Cognitive communication deficit: Secondary | ICD-10-CM | POA: Diagnosis not present

## 2015-06-06 DIAGNOSIS — R278 Other lack of coordination: Secondary | ICD-10-CM | POA: Diagnosis not present

## 2015-06-08 ENCOUNTER — Other Ambulatory Visit: Payer: Self-pay | Admitting: Neurology

## 2015-06-08 DIAGNOSIS — R262 Difficulty in walking, not elsewhere classified: Secondary | ICD-10-CM | POA: Diagnosis not present

## 2015-06-08 DIAGNOSIS — R41841 Cognitive communication deficit: Secondary | ICD-10-CM | POA: Diagnosis not present

## 2015-06-08 DIAGNOSIS — M25561 Pain in right knee: Secondary | ICD-10-CM | POA: Diagnosis not present

## 2015-06-08 DIAGNOSIS — R2681 Unsteadiness on feet: Secondary | ICD-10-CM | POA: Diagnosis not present

## 2015-06-08 DIAGNOSIS — M6281 Muscle weakness (generalized): Secondary | ICD-10-CM | POA: Diagnosis not present

## 2015-06-08 DIAGNOSIS — R278 Other lack of coordination: Secondary | ICD-10-CM | POA: Diagnosis not present

## 2015-06-08 NOTE — Telephone Encounter (Signed)
Last OV: 10/31/14 Next OV: 3/3/317

## 2015-06-09 DIAGNOSIS — H401111 Primary open-angle glaucoma, right eye, mild stage: Secondary | ICD-10-CM | POA: Diagnosis not present

## 2015-06-13 DIAGNOSIS — R262 Difficulty in walking, not elsewhere classified: Secondary | ICD-10-CM | POA: Diagnosis not present

## 2015-06-13 DIAGNOSIS — R2681 Unsteadiness on feet: Secondary | ICD-10-CM | POA: Diagnosis not present

## 2015-06-13 DIAGNOSIS — R278 Other lack of coordination: Secondary | ICD-10-CM | POA: Diagnosis not present

## 2015-06-13 DIAGNOSIS — M25561 Pain in right knee: Secondary | ICD-10-CM | POA: Diagnosis not present

## 2015-06-13 DIAGNOSIS — M6281 Muscle weakness (generalized): Secondary | ICD-10-CM | POA: Diagnosis not present

## 2015-06-13 DIAGNOSIS — R41841 Cognitive communication deficit: Secondary | ICD-10-CM | POA: Diagnosis not present

## 2015-06-15 DIAGNOSIS — R262 Difficulty in walking, not elsewhere classified: Secondary | ICD-10-CM | POA: Diagnosis not present

## 2015-06-15 DIAGNOSIS — M25561 Pain in right knee: Secondary | ICD-10-CM | POA: Diagnosis not present

## 2015-06-15 DIAGNOSIS — R41841 Cognitive communication deficit: Secondary | ICD-10-CM | POA: Diagnosis not present

## 2015-06-15 DIAGNOSIS — R2681 Unsteadiness on feet: Secondary | ICD-10-CM | POA: Diagnosis not present

## 2015-06-15 DIAGNOSIS — M6281 Muscle weakness (generalized): Secondary | ICD-10-CM | POA: Diagnosis not present

## 2015-06-15 DIAGNOSIS — R278 Other lack of coordination: Secondary | ICD-10-CM | POA: Diagnosis not present

## 2015-06-16 DIAGNOSIS — M6281 Muscle weakness (generalized): Secondary | ICD-10-CM | POA: Diagnosis not present

## 2015-06-16 DIAGNOSIS — R278 Other lack of coordination: Secondary | ICD-10-CM | POA: Diagnosis not present

## 2015-06-16 DIAGNOSIS — R262 Difficulty in walking, not elsewhere classified: Secondary | ICD-10-CM | POA: Diagnosis not present

## 2015-06-16 DIAGNOSIS — R2681 Unsteadiness on feet: Secondary | ICD-10-CM | POA: Diagnosis not present

## 2015-06-16 DIAGNOSIS — R41841 Cognitive communication deficit: Secondary | ICD-10-CM | POA: Diagnosis not present

## 2015-06-16 DIAGNOSIS — M25561 Pain in right knee: Secondary | ICD-10-CM | POA: Diagnosis not present

## 2015-06-17 DIAGNOSIS — M1712 Unilateral primary osteoarthritis, left knee: Secondary | ICD-10-CM | POA: Diagnosis not present

## 2015-06-19 DIAGNOSIS — R262 Difficulty in walking, not elsewhere classified: Secondary | ICD-10-CM | POA: Diagnosis not present

## 2015-06-19 DIAGNOSIS — M25561 Pain in right knee: Secondary | ICD-10-CM | POA: Diagnosis not present

## 2015-06-19 DIAGNOSIS — R41841 Cognitive communication deficit: Secondary | ICD-10-CM | POA: Diagnosis not present

## 2015-06-19 DIAGNOSIS — R278 Other lack of coordination: Secondary | ICD-10-CM | POA: Diagnosis not present

## 2015-06-19 DIAGNOSIS — R2681 Unsteadiness on feet: Secondary | ICD-10-CM | POA: Diagnosis not present

## 2015-06-19 DIAGNOSIS — M6281 Muscle weakness (generalized): Secondary | ICD-10-CM | POA: Diagnosis not present

## 2015-06-20 DIAGNOSIS — M25561 Pain in right knee: Secondary | ICD-10-CM | POA: Diagnosis not present

## 2015-06-20 DIAGNOSIS — R2681 Unsteadiness on feet: Secondary | ICD-10-CM | POA: Diagnosis not present

## 2015-06-20 DIAGNOSIS — R41841 Cognitive communication deficit: Secondary | ICD-10-CM | POA: Diagnosis not present

## 2015-06-20 DIAGNOSIS — R262 Difficulty in walking, not elsewhere classified: Secondary | ICD-10-CM | POA: Diagnosis not present

## 2015-06-20 DIAGNOSIS — R278 Other lack of coordination: Secondary | ICD-10-CM | POA: Diagnosis not present

## 2015-06-20 DIAGNOSIS — M6281 Muscle weakness (generalized): Secondary | ICD-10-CM | POA: Diagnosis not present

## 2015-06-22 ENCOUNTER — Encounter: Payer: Self-pay | Admitting: Neurology

## 2015-06-22 ENCOUNTER — Other Ambulatory Visit (INDEPENDENT_AMBULATORY_CARE_PROVIDER_SITE_OTHER): Payer: Medicare Other

## 2015-06-22 ENCOUNTER — Ambulatory Visit (INDEPENDENT_AMBULATORY_CARE_PROVIDER_SITE_OTHER): Payer: Medicare Other | Admitting: Neurology

## 2015-06-22 VITALS — BP 136/78 | HR 68 | Ht 65.0 in | Wt 229.0 lb

## 2015-06-22 DIAGNOSIS — F0280 Dementia in other diseases classified elsewhere without behavioral disturbance: Secondary | ICD-10-CM | POA: Diagnosis not present

## 2015-06-22 DIAGNOSIS — R41841 Cognitive communication deficit: Secondary | ICD-10-CM | POA: Diagnosis not present

## 2015-06-22 DIAGNOSIS — G308 Other Alzheimer's disease: Secondary | ICD-10-CM | POA: Diagnosis not present

## 2015-06-22 DIAGNOSIS — R278 Other lack of coordination: Secondary | ICD-10-CM | POA: Diagnosis not present

## 2015-06-22 DIAGNOSIS — R262 Difficulty in walking, not elsewhere classified: Secondary | ICD-10-CM | POA: Diagnosis not present

## 2015-06-22 DIAGNOSIS — I679 Cerebrovascular disease, unspecified: Secondary | ICD-10-CM

## 2015-06-22 DIAGNOSIS — B0222 Postherpetic trigeminal neuralgia: Secondary | ICD-10-CM

## 2015-06-22 DIAGNOSIS — M6281 Muscle weakness (generalized): Secondary | ICD-10-CM | POA: Diagnosis not present

## 2015-06-22 DIAGNOSIS — R2681 Unsteadiness on feet: Secondary | ICD-10-CM | POA: Diagnosis not present

## 2015-06-22 DIAGNOSIS — M25561 Pain in right knee: Secondary | ICD-10-CM | POA: Diagnosis not present

## 2015-06-22 MED ORDER — DONEPEZIL HCL 10 MG PO TABS: 10.0000 mg | ORAL_TABLET | Freq: Every day | ORAL | Status: DC

## 2015-06-22 NOTE — Patient Instructions (Signed)
1.  Increase donepezil (Aricept) to 10mg  at bedtime 2.  Continue [name] 300mg  three times daily 3.  Let's try lowering the dose of the Carbatrol 100mg  pills.  Take 2 pills three times daily for 2 weeks  Then 1 pill three times daily.  Call in 4 weeks with update. 4.  Repeat testing with Dr. Si Raider in October (here in my office) 5.  Follow up with me in November 6.  Continue speech therapy and physical therapy 7.  Continue Lovenox and pravastatin

## 2015-06-22 NOTE — Progress Notes (Signed)
NEUROLOGY FOLLOW UP OFFICE NOTE  AMARIONNA HASTINGS [id number]  HISTORY OF PRESENT ILLNESS: [name] is an 82 year old right-handed woman with history of hypertension, CAD, atrial fibrillation, stroke with right-sided weakness, hypercholesterolemia, diabetes mellitus, DVT and pulmonary embolism (on Lovenox), migraine, herpes zoster, sleep apnea, gait disorder, memory problems and anxiety who follows up for memory problems and right post-herpetic trigeminal neuralgia.  She is accompanied by her daughter who provides some history.  Neuropsych testing report and brain MRI from November reviewed.   I  ATYPICAL RIGHT FACIAL PAIN: History: Onset of symptoms started in 2006, following episode of herpes zoster involving she describes sharp pain involving the the right V1 and V2 distribution, associated with allodynia. Chewing, talking, and brushing her teeth triggers and exacerbates the pain.  She was subsequently treated for postherpetic neuralgia.    Current medication:  Carbatrol 300mg /200mg /300mg , Gabapentin 300mg  at bedtime.    Past medications:   carbamazepine generic which led to breakthrough pain.    Update: Pain fairly well-controlled.   II.  COGNITIVE DEFICITS: History: This started about a year ago after initiation of gabapentin. It was fairly sudden onset and has not progressed.  She tends to repeat herself often, such as telling stories. She repeats questions often. She reports word-finding difficulties as well.  She does have difficulty using the phone and managing her finances. She typically lives by herself in her one story house. She manages her own finances. She doesn't drive 2 to remote history of blackout spells. She typically does not have problems recognizing people or recalling names. She denies any depression. She denies any hallucinations. She does have history of vivid nightmares.  She reports a maternal aunt with history of dementia. To evaluate cognitive problems, an MRI  of the brain was performed on 12/01/13, which revealed extensive small vessel ischemic changes throughout the deep and subcortical white matter, including remote small infarcts involving the cerebellum and left basal ganglia.  Chronic micro-hemorrhages also noted, most notably in the right frontal lobe.  No acute findings noted.  For several years, she has episodes of recurrent speech hesitancy.  She knows what she wants to say but has trouble getting the words out.  In the past, it has occurred in setting of UTI.   To evaluate episodes of transient speech dysfunction, EEG was performed on 03/10/14, which was normal.  She went to speech therapy without any real improvement.  Therapist thinks it is likely medication-related.  Update: She underwent neuropsychological testing on 01/12/15.  Testing was consistent with mild dementia.  She exhibited cognitive dysfunction involving the medial-temporal lobe, which is suggestive of Alzheimer's disease.  However, family endorses abrupt onset, which correlates with vascular etiology.  Repeat MRI of the brain performed 02/22/15, stable compared to prior imaging from 12/01/13.    She was started on Aricept 5mg  daily.  PAST MEDICAL HISTORY: Past Medical History  Diagnosis Date  . Shingles     zoster 1990 on the back and on the abdomen in 2007,facial zoster 2006  . Hypertension   . A-fib ([place])   . Glaucoma   . Pulmonary embolism recurrent     IVC filter  . Acid reflux   . Arthritis   . Migraine   . Postherpetic trigeminal neuralgia 10/20/2012  . Unspecified cerebral artery occlusion with cerebral infarction   . Memory loss   . Chronic anticoagulation 03/07/2013    Lovenox 100 QD  . Junctional bradycardia     resolved with discontinuation of  sotalol (2014) after 10 yrs  . DVT (deep venous thrombosis) ([place]) 2007    recurrent w PE s/p [place] filter  . Hypoglycemia   . GERD (gastroesophageal reflux disease)     w hiatal hernia endoscopy 2003, Dr Penelope Coop    . Diverticulosis   . Phlebitis   . Osteoarthritis     in Bilateral knees and back. -Dr Latanya Maudlin  . Panic attacks   . Anxiety   . IBS (irritable bowel syndrome)   . Hypothyroidism     goiter  . Vitamin D deficiency   . CVA (cerebral infarction) 1964  . Osteoporosis   . Trigeminal neuralgia     r face--Dr Dohmeier  . Hypercholesterolemia   . Chronic kidney disease     Stage III  . Dementia     Dr Brett Fairy    MEDICATIONS: Current Outpatient Prescriptions on File Prior to Visit  Medication Sig Dispense Refill  . amLODipine (NORVASC) 2.5 MG tablet TAKE 1 TABLET BY MOUTH EVERY DAY 30 tablet 11  . CARBATROL 100 MG 12 hr capsule TAKE 3 CAPSULES BY MOUTH EVERY MORNING, 3 caps AT NOON, & 3 caps AT BEDTIME. TAKE AFTER MEALS. 270 capsule 2  . CARBATROL 100 MG 12 hr capsule TAKE 3 caps BY MOUTH EVERY MORNING, 2 CAPS AT NOON, AND 3 caps AT BEDTIME. TAKE AFTER MEALS. 240 capsule 3  . enoxaparin (LOVENOX) 100 MG/ML injection Inject 100 mg into the skin at bedtime.     Marland Kitchen EPINEPHrine (EPIPEN IJ) Inject as directed once as needed. For allergic reactions    . Fe Fum-FA-B Cmp-C-Zn-Mg-Mn-Cu (HEMOCYTE PLUS) 106-1 MG CAPS Take 1 capsule by mouth daily.     . fluticasone (FLONASE) 50 MCG/ACT nasal spray Place 1 spray into left nostril daily as needed for allergies or rhinitis.   12  . folic acid (FOLVITE) 1 MG tablet Take 1 mg by mouth every morning.     . gabapentin (NEURONTIN) 300 MG capsule Take 1 capsule (300 mg total) by mouth 3 (three) times daily. 90 capsule 3  . [name] 300 MG TABS TAKE 1 TABLET BY MOUTH EVERY MORNING, 1 TABLET AT NOON, AND 1 TABLET AT BEDTIME 270 tablet 1  . HYDROcodone-acetaminophen (NORCO/VICODIN) 5-325 MG per tablet Take 1 tablet by mouth every 6 (six) hours as needed for moderate pain.    Marland Kitchen levothyroxine (SYNTHROID, LEVOTHROID) 75 MCG tablet Take 75 mcg by mouth daily before breakfast.     . loratadine (CLARITIN) 10 MG tablet Take 10 mg by mouth daily.    . meclizine  (ANTIVERT) 25 MG tablet Take 25 mg by mouth 2 (two) times daily as needed for dizziness or nausea.   1  . Multiple Vitamin (MULTIVITAMIN) tablet Take 1 tablet by mouth every morning.     . pravastatin (PRAVACHOL) 40 MG tablet Take 40 mg by mouth every morning.     . RABEprazole (ACIPHEX) 20 MG tablet Take 20 mg by mouth every morning.     . sodium chloride 1 G tablet Take 1 tablet (1 g total) by mouth 2 (two) times daily with a meal. 60 tablet 0  . Travoprost, BAK Free, (TRAVATAN) 0.004 % SOLN ophthalmic solution Place 1 drop into both eyes at bedtime.    . Vitamin D, Ergocalciferol, (DRISDOL) 50000 UNITS CAPS capsule Take 50,000 Units by mouth every Saturday.      No current facility-administered medications on file prior to visit.    ALLERGIES: Allergies  Allergen Reactions  . Benadryl [  Diphenhydramine Hcl] Hives and Shortness Of Breath  . Eye Drops [Tetrahydrozoline Hcl] Other (See Comments)    Burning, itching, swelling  . Flu Virus Vaccine Shortness Of Breath  . Iohexol Hives and Shortness Of Breath     Code: HIVES, Desc: PT IS ALLERGIC TO IVP DYE/IODINE/SHRIMP.  CAUSES SOB AND HIVES PER PT.  STEPHANIE, RT-R, Onset Date: [id number]   . Shrimp [Shellfish Allergy] Anaphylaxis  . Voltaren [Diclofenac Sodium] Other (See Comments)    Acid reflux symptoms  . Ambien [Zolpidem Tartrate] Hives  . Gluten Meal Diarrhea  . Travatan Z [Travoprost] Other (See Comments)    BRAND NAME ONLY OLCANNOT TOLERATE GENERIC FORM OF EYE DROPS, IT CONTAINS PRESERVATIVES-SENSITIVITY  . Ativan [Lorazepam]     delusional  . Hydrocodone     Only on the higher dosages  . Betadine Antibiotic-Moisturize [Bacitracin-Polymyxin B] Rash  . Biaxin [Clarithromycin] Hives  . Celebrex [Celecoxib] Rash  . Ciprofloxacin Hcl Rash       . Combigan [Brimonidine Tartrate-Timolol] Other (See Comments)    unknown  . Coumadin [Warfarin Sodium] Rash  . Ivp Dye [Iodinated Diagnostic Agents] Hives  . Levaquin [Levofloxacin  In D5w] Rash  . Naprosyn [Naproxen] Rash  . Pataday [Olopatadine Hcl] Other (See Comments)  . Penicillins Hives    Has patient had a PCN reaction causing immediate rash, facial/tongue/throat swelling, SOB or lightheadedness with hypotension: Yes Has patient had a PCN reaction causing severe rash involving mucus membranes or skin necrosis: No Has patient had a PCN reaction that required hospitalization No Has patient had a PCN reaction occurring within the last 10 years: No If all of the above answers are "NO", then may proceed with Cephalosporin use.   Marland Kitchen Plavix [Clopidogrel Bisulfate] Rash  . Septra [Sulfamethoxazole-Trimethoprim] Hives    FAMILY HISTORY: Family History  Problem Relation Age of Onset  . Diabetes Mother   . Hypertension Mother   . Glaucoma Mother   . CVA Mother   . CAD Mother   . Hypertension Father   . Heart attack Father   . Kidney failure Father   . Heart attack Brother     2 half brothers  . Hypertension Brother   . CVA Brother   . Glaucoma Other   . Glaucoma Other   . Breast cancer Maternal Aunt   . Colon cancer Maternal Uncle   . Rectal cancer Maternal Uncle   . Prostate cancer Maternal Grandfather     SOCIAL HISTORY: Social History   Social History  . Marital Status: Divorced    Spouse Name: N/A  . Number of Children: 3  . Years of Education: 14   Occupational History  . retired     Armed forces training and education officer   Social History Main Topics  . Smoking status: Never Smoker   . Smokeless tobacco: Never Used  . Alcohol Use: No     Comment: very moderate, none  in the last two months  . Drug Use: No  . Sexual Activity: No   Other Topics Concern  . Not on file   Social History Narrative   Patient is retired and lives at home alone. Patient has a college education.    Patient has three children.   Patient is right-handed.   Patient drinks 1-2 cups of caffeine daily.    REVIEW OF SYSTEMS: Constitutional: No fevers, chills, or sweats, no  generalized fatigue, change in appetite Eyes: No visual changes, double vision, eye pain Ear, nose and throat: No hearing loss,  ear pain, nasal congestion, sore throat Cardiovascular: No chest pain, palpitations Respiratory:  No shortness of breath at rest or with exertion, wheezes GastrointestinaI: No nausea, vomiting, diarrhea, abdominal pain, fecal incontinence Genitourinary:  No dysuria, urinary retention or frequency Musculoskeletal:  No neck pain, back pain Integumentary: No rash, pruritus, skin lesions Neurological: as above Psychiatric: No depression, insomnia, anxiety Endocrine: No palpitations, fatigue, diaphoresis, mood swings, change in appetite, change in weight, increased thirst Hematologic/Lymphatic:  No anemia, purpura, petechiae. Allergic/Immunologic: no itchy/runny eyes, nasal congestion, recent allergic reactions, rashes  PHYSICAL EXAM: Filed Vitals:   06/22/15 1535  BP: 136/78  Pulse: 68   General: No acute distress.  Patient appears well-groomed.   Head:  Normocephalic/atraumatic Eyes:  Fundoscopic exam unremarkable without vessel changes, exudates, hemorrhages or papilledema. Neck: supple, no paraspinal tenderness, full range of motion Heart:  Regular rate and rhythm Lungs:  Clear to auscultation bilaterally Back: No paraspinal tenderness Neurological Exam: alert and oriented to person, place, and time. Attention span and concentration intact, recent memory poor,and remote memory intact, fund of knowledge intact.  Speech fluent and not dysarthric.  Able to repeat.  Able to follow commands.  Difficulty with some complex naming and made some paraphasic errors (pencil instead of pen).  CN II-XII intact. Fundi not visualized.  Bulk and tone normal, muscle strength 5/5 throughout.  Finger to nose testing intact.  Ambulates with walker  IMPRESSION: Alzheimer's dementia or mixed Alzheimer's-vascular dementia Cerebrovascular disease Post-herpetic neuralgia  PLAN: 1.   Increase Aricept to 10mg  at bedtime 2.  Continue Lovenox. 3.  Continue pravastatin as per Dr. Dema Severin.  LDL goal should be less than 70. 4.  Blood pressure control 5.  I would like to see if we can taper off of the neuropathic medications.  We will continue [name] 300mg  three times daily.  We will start tapering down Carbatrol to 200mg  three times daily for 2 weeks, then to 100mg  three times daily.  I want her to call in 4 weeks with update. 6.  Check CBC and CMP 7.  Repeat neuropsychological testing with Dr. Si Raider in October 2017 8.  Follow up with me in November 9.  Continue PT, speech therapy, puzzles  Metta Clines, DO  CC: [name], MD

## 2015-06-23 DIAGNOSIS — R2681 Unsteadiness on feet: Secondary | ICD-10-CM | POA: Diagnosis not present

## 2015-06-23 DIAGNOSIS — R278 Other lack of coordination: Secondary | ICD-10-CM | POA: Diagnosis not present

## 2015-06-23 DIAGNOSIS — R41841 Cognitive communication deficit: Secondary | ICD-10-CM | POA: Diagnosis not present

## 2015-06-23 DIAGNOSIS — M6281 Muscle weakness (generalized): Secondary | ICD-10-CM | POA: Diagnosis not present

## 2015-06-23 DIAGNOSIS — R262 Difficulty in walking, not elsewhere classified: Secondary | ICD-10-CM | POA: Diagnosis not present

## 2015-06-23 DIAGNOSIS — M25561 Pain in right knee: Secondary | ICD-10-CM | POA: Diagnosis not present

## 2015-06-23 LAB — COMPREHENSIVE METABOLIC PANEL
ALBUMIN: 4.2 g/dL (ref 3.5–5.2)
ALT: 9 U/L (ref 0–35)
AST: 16 U/L (ref 0–37)
Alkaline Phosphatase: 78 U/L (ref 39–117)
BUN: 28 mg/dL — AB (ref 6–23)
CHLORIDE: 104 meq/L (ref 96–112)
CO2: 25 meq/L (ref 19–32)
CREATININE: 1.26 mg/dL — AB (ref 0.40–1.20)
Calcium: 8.9 mg/dL (ref 8.4–10.5)
GFR: 43.02 mL/min — ABNORMAL LOW (ref >60.00)
GLUCOSE: 132 mg/dL — AB (ref 70–99)
POTASSIUM: 4.6 meq/L (ref 3.5–5.1)
SODIUM: 136 meq/L (ref 135–145)
Total Bilirubin: 0.3 mg/dL (ref 0.2–1.2)
Total Protein: 6.8 g/dL (ref 6.0–8.3)

## 2015-06-23 LAB — CBC WITH DIFFERENTIAL/PLATELET
BASOS ABS: 0 10*3/uL (ref 0.0–0.1)
Basophils Relative: 0.3 % (ref 0.0–3.0)
EOS ABS: 0.2 10*3/uL (ref 0.0–0.7)
Eosinophils Relative: 3.1 % (ref 0.0–5.0)
HEMATOCRIT: 30.1 % — AB (ref 36.0–46.0)
Hemoglobin: 10.1 g/dL — ABNORMAL LOW (ref 12.0–15.0)
LYMPHS ABS: 1.3 10*3/uL (ref 0.7–4.0)
Lymphocytes Relative: 16.7 % (ref 12.0–46.0)
MCHC: 33.5 g/dL (ref 30.0–36.0)
MCV: 83.3 fl (ref 78.0–100.0)
Monocytes Absolute: 0.8 10*3/uL (ref 0.1–1.0)
Monocytes Relative: 10.7 % (ref 3.0–12.0)
NEUTROS PCT: 69.2 % (ref 43.0–77.0)
Neutro Abs: 5.4 10*3/uL (ref 1.4–7.7)
Platelets: 164 10*3/uL (ref 150.0–400.0)
RBC: 3.62 Mil/uL — AB (ref 3.87–5.11)
RDW: 19.1 % — ABNORMAL HIGH (ref 11.5–15.5)
WBC: 7.9 10*3/uL (ref 4.0–10.5)

## 2015-06-23 NOTE — Progress Notes (Signed)
Chart forwarded.  

## 2015-06-26 DIAGNOSIS — R41841 Cognitive communication deficit: Secondary | ICD-10-CM | POA: Diagnosis not present

## 2015-06-26 DIAGNOSIS — R2681 Unsteadiness on feet: Secondary | ICD-10-CM | POA: Diagnosis not present

## 2015-06-26 DIAGNOSIS — R278 Other lack of coordination: Secondary | ICD-10-CM | POA: Diagnosis not present

## 2015-06-26 DIAGNOSIS — M6281 Muscle weakness (generalized): Secondary | ICD-10-CM | POA: Diagnosis not present

## 2015-06-26 DIAGNOSIS — R262 Difficulty in walking, not elsewhere classified: Secondary | ICD-10-CM | POA: Diagnosis not present

## 2015-06-26 DIAGNOSIS — M25561 Pain in right knee: Secondary | ICD-10-CM | POA: Diagnosis not present

## 2015-06-27 DIAGNOSIS — H26492 Other secondary cataract, left eye: Secondary | ICD-10-CM | POA: Diagnosis not present

## 2015-06-29 DIAGNOSIS — M25561 Pain in right knee: Secondary | ICD-10-CM | POA: Diagnosis not present

## 2015-06-29 DIAGNOSIS — R2681 Unsteadiness on feet: Secondary | ICD-10-CM | POA: Diagnosis not present

## 2015-06-29 DIAGNOSIS — R262 Difficulty in walking, not elsewhere classified: Secondary | ICD-10-CM | POA: Diagnosis not present

## 2015-06-29 DIAGNOSIS — R278 Other lack of coordination: Secondary | ICD-10-CM | POA: Diagnosis not present

## 2015-06-29 DIAGNOSIS — R41841 Cognitive communication deficit: Secondary | ICD-10-CM | POA: Diagnosis not present

## 2015-06-29 DIAGNOSIS — M6281 Muscle weakness (generalized): Secondary | ICD-10-CM | POA: Diagnosis not present

## 2015-06-30 DIAGNOSIS — R2681 Unsteadiness on feet: Secondary | ICD-10-CM | POA: Diagnosis not present

## 2015-06-30 DIAGNOSIS — R278 Other lack of coordination: Secondary | ICD-10-CM | POA: Diagnosis not present

## 2015-06-30 DIAGNOSIS — M6281 Muscle weakness (generalized): Secondary | ICD-10-CM | POA: Diagnosis not present

## 2015-06-30 DIAGNOSIS — M25561 Pain in right knee: Secondary | ICD-10-CM | POA: Diagnosis not present

## 2015-06-30 DIAGNOSIS — R262 Difficulty in walking, not elsewhere classified: Secondary | ICD-10-CM | POA: Diagnosis not present

## 2015-06-30 DIAGNOSIS — R41841 Cognitive communication deficit: Secondary | ICD-10-CM | POA: Diagnosis not present

## 2015-07-03 DIAGNOSIS — M25561 Pain in right knee: Secondary | ICD-10-CM | POA: Diagnosis not present

## 2015-07-03 DIAGNOSIS — R278 Other lack of coordination: Secondary | ICD-10-CM | POA: Diagnosis not present

## 2015-07-03 DIAGNOSIS — R262 Difficulty in walking, not elsewhere classified: Secondary | ICD-10-CM | POA: Diagnosis not present

## 2015-07-03 DIAGNOSIS — M6281 Muscle weakness (generalized): Secondary | ICD-10-CM | POA: Diagnosis not present

## 2015-07-03 DIAGNOSIS — R41841 Cognitive communication deficit: Secondary | ICD-10-CM | POA: Diagnosis not present

## 2015-07-03 DIAGNOSIS — R2681 Unsteadiness on feet: Secondary | ICD-10-CM | POA: Diagnosis not present

## 2015-07-04 DIAGNOSIS — M6281 Muscle weakness (generalized): Secondary | ICD-10-CM | POA: Diagnosis not present

## 2015-07-04 DIAGNOSIS — R41841 Cognitive communication deficit: Secondary | ICD-10-CM | POA: Diagnosis not present

## 2015-07-04 DIAGNOSIS — R278 Other lack of coordination: Secondary | ICD-10-CM | POA: Diagnosis not present

## 2015-07-04 DIAGNOSIS — R2681 Unsteadiness on feet: Secondary | ICD-10-CM | POA: Diagnosis not present

## 2015-07-04 DIAGNOSIS — R262 Difficulty in walking, not elsewhere classified: Secondary | ICD-10-CM | POA: Diagnosis not present

## 2015-07-04 DIAGNOSIS — M25561 Pain in right knee: Secondary | ICD-10-CM | POA: Diagnosis not present

## 2015-07-06 DIAGNOSIS — M25561 Pain in right knee: Secondary | ICD-10-CM | POA: Diagnosis not present

## 2015-07-06 DIAGNOSIS — M6281 Muscle weakness (generalized): Secondary | ICD-10-CM | POA: Diagnosis not present

## 2015-07-06 DIAGNOSIS — R41841 Cognitive communication deficit: Secondary | ICD-10-CM | POA: Diagnosis not present

## 2015-07-06 DIAGNOSIS — R262 Difficulty in walking, not elsewhere classified: Secondary | ICD-10-CM | POA: Diagnosis not present

## 2015-07-06 DIAGNOSIS — R278 Other lack of coordination: Secondary | ICD-10-CM | POA: Diagnosis not present

## 2015-07-06 DIAGNOSIS — R2681 Unsteadiness on feet: Secondary | ICD-10-CM | POA: Diagnosis not present

## 2015-07-11 ENCOUNTER — Telehealth: Payer: Self-pay | Admitting: Neurology

## 2015-07-11 DIAGNOSIS — N183 Chronic kidney disease, stage 3 (moderate): Secondary | ICD-10-CM | POA: Diagnosis not present

## 2015-07-11 DIAGNOSIS — I129 Hypertensive chronic kidney disease with stage 1 through stage 4 chronic kidney disease, or unspecified chronic kidney disease: Secondary | ICD-10-CM | POA: Diagnosis not present

## 2015-07-11 DIAGNOSIS — Z86711 Personal history of pulmonary embolism: Secondary | ICD-10-CM | POA: Diagnosis not present

## 2015-07-11 DIAGNOSIS — G500 Trigeminal neuralgia: Secondary | ICD-10-CM | POA: Diagnosis not present

## 2015-07-11 DIAGNOSIS — E785 Hyperlipidemia, unspecified: Secondary | ICD-10-CM | POA: Diagnosis not present

## 2015-07-11 NOTE — Telephone Encounter (Signed)
VM-PT left message to have a call back/Dawn CB# [phone]

## 2015-07-12 ENCOUNTER — Other Ambulatory Visit: Payer: Self-pay

## 2015-07-12 NOTE — Telephone Encounter (Signed)
Left message for pt to return call if she still had questions/concerns.

## 2015-07-12 NOTE — Telephone Encounter (Signed)
Spoke with patient. States she has lowered her Carbatrol, has been taking 100 mg daily since Sunday. Yesterday pt noticed a small amount of pain in her face on her right side while washing her face. Pain was only present while touching the area. Resolved after a few minutes. This morning pt woke up with R facial pain, again pt states pain level was ~ 1 or 2/10. Pain has resolved. Pt states she is worried, because she does not want to have to go through that pain again. Please advise.

## 2015-07-12 NOTE — Telephone Encounter (Signed)
Pt aware.

## 2015-07-13 DIAGNOSIS — R262 Difficulty in walking, not elsewhere classified: Secondary | ICD-10-CM | POA: Diagnosis not present

## 2015-07-13 DIAGNOSIS — R41841 Cognitive communication deficit: Secondary | ICD-10-CM | POA: Diagnosis not present

## 2015-07-13 DIAGNOSIS — R278 Other lack of coordination: Secondary | ICD-10-CM | POA: Diagnosis not present

## 2015-07-13 DIAGNOSIS — M25561 Pain in right knee: Secondary | ICD-10-CM | POA: Diagnosis not present

## 2015-07-13 DIAGNOSIS — M6281 Muscle weakness (generalized): Secondary | ICD-10-CM | POA: Diagnosis not present

## 2015-07-13 DIAGNOSIS — R2681 Unsteadiness on feet: Secondary | ICD-10-CM | POA: Diagnosis not present

## 2015-07-17 DIAGNOSIS — M1712 Unilateral primary osteoarthritis, left knee: Secondary | ICD-10-CM | POA: Diagnosis not present

## 2015-07-19 DIAGNOSIS — R278 Other lack of coordination: Secondary | ICD-10-CM | POA: Diagnosis not present

## 2015-07-19 DIAGNOSIS — M25561 Pain in right knee: Secondary | ICD-10-CM | POA: Diagnosis not present

## 2015-07-19 DIAGNOSIS — R2681 Unsteadiness on feet: Secondary | ICD-10-CM | POA: Diagnosis not present

## 2015-07-19 DIAGNOSIS — M6281 Muscle weakness (generalized): Secondary | ICD-10-CM | POA: Diagnosis not present

## 2015-07-19 DIAGNOSIS — R41841 Cognitive communication deficit: Secondary | ICD-10-CM | POA: Diagnosis not present

## 2015-07-19 DIAGNOSIS — R262 Difficulty in walking, not elsewhere classified: Secondary | ICD-10-CM | POA: Diagnosis not present

## 2015-07-20 DIAGNOSIS — R262 Difficulty in walking, not elsewhere classified: Secondary | ICD-10-CM | POA: Diagnosis not present

## 2015-07-20 DIAGNOSIS — R41841 Cognitive communication deficit: Secondary | ICD-10-CM | POA: Diagnosis not present

## 2015-07-20 DIAGNOSIS — R278 Other lack of coordination: Secondary | ICD-10-CM | POA: Diagnosis not present

## 2015-07-20 DIAGNOSIS — M6281 Muscle weakness (generalized): Secondary | ICD-10-CM | POA: Diagnosis not present

## 2015-07-20 DIAGNOSIS — M25561 Pain in right knee: Secondary | ICD-10-CM | POA: Diagnosis not present

## 2015-07-20 DIAGNOSIS — R2681 Unsteadiness on feet: Secondary | ICD-10-CM | POA: Diagnosis not present

## 2015-07-21 DIAGNOSIS — M6281 Muscle weakness (generalized): Secondary | ICD-10-CM | POA: Diagnosis not present

## 2015-07-21 DIAGNOSIS — R41841 Cognitive communication deficit: Secondary | ICD-10-CM | POA: Diagnosis not present

## 2015-07-21 DIAGNOSIS — M25561 Pain in right knee: Secondary | ICD-10-CM | POA: Diagnosis not present

## 2015-07-21 DIAGNOSIS — R278 Other lack of coordination: Secondary | ICD-10-CM | POA: Diagnosis not present

## 2015-07-21 DIAGNOSIS — R262 Difficulty in walking, not elsewhere classified: Secondary | ICD-10-CM | POA: Diagnosis not present

## 2015-07-21 DIAGNOSIS — R2681 Unsteadiness on feet: Secondary | ICD-10-CM | POA: Diagnosis not present

## 2015-07-24 DIAGNOSIS — M1712 Unilateral primary osteoarthritis, left knee: Secondary | ICD-10-CM | POA: Diagnosis not present

## 2015-07-25 ENCOUNTER — Encounter: Payer: Self-pay | Admitting: Podiatry

## 2015-07-25 ENCOUNTER — Ambulatory Visit (INDEPENDENT_AMBULATORY_CARE_PROVIDER_SITE_OTHER): Payer: Medicare Other | Admitting: Podiatry

## 2015-07-25 DIAGNOSIS — R41841 Cognitive communication deficit: Secondary | ICD-10-CM | POA: Diagnosis not present

## 2015-07-25 DIAGNOSIS — B351 Tinea unguium: Secondary | ICD-10-CM | POA: Diagnosis not present

## 2015-07-25 DIAGNOSIS — M79676 Pain in unspecified toe(s): Secondary | ICD-10-CM

## 2015-07-25 DIAGNOSIS — L84 Corns and callosities: Secondary | ICD-10-CM | POA: Diagnosis not present

## 2015-07-25 NOTE — Patient Instructions (Signed)
Removed Band-Aid on fourth left toe 1-3 days and continue to apply topical antibiotic ointment and a Band-Aid until a scab forms  Diabetes and Foot Care Diabetes may cause you to have problems because of poor blood supply (circulation) to your feet and legs. This may cause the skin on your feet to become thinner, break easier, and heal more slowly. Your skin may become dry, and the skin may peel and crack. You may also have nerve damage in your legs and feet causing decreased feeling in them. You may not notice minor injuries to your feet that could lead to infections or more serious problems. Taking care of your feet is one of the most important things you can do for yourself.  HOME CARE INSTRUCTIONS  Wear shoes at all times, even in the house. Do not go barefoot. Bare feet are easily injured.  Check your feet daily for blisters, cuts, and redness. If you cannot see the bottom of your feet, use a mirror or ask someone for help.  Wash your feet with warm water (do not use hot water) and mild soap. Then pat your feet and the areas between your toes until they are completely dry. Do not soak your feet as this can dry your skin.  Apply a moisturizing lotion or petroleum jelly (that does not contain alcohol and is unscented) to the skin on your feet and to dry, brittle toenails. Do not apply lotion between your toes.  Trim your toenails straight across. Do not dig under them or around the cuticle. File the edges of your nails with an emery board or nail file.  Do not cut corns or calluses or try to remove them with medicine.  Wear clean socks or stockings every day. Make sure they are not too tight. Do not wear knee-high stockings since they may decrease blood flow to your legs.  Wear shoes that fit properly and have enough cushioning. To break in new shoes, wear them for just a few hours a day. This prevents you from injuring your feet. Always look in your shoes before you put them on to be sure  there are no objects inside.  Do not cross your legs. This may decrease the blood flow to your feet.  If you find a minor scrape, cut, or break in the skin on your feet, keep it and the skin around it clean and dry. These areas may be cleansed with mild soap and water. Do not cleanse the area with peroxide, alcohol, or iodine.  When you remove an adhesive bandage, be sure not to damage the skin around it.  If you have a wound, look at it several times a day to make sure it is healing.  Do not use heating pads or hot water bottles. They may burn your skin. If you have lost feeling in your feet or legs, you may not know it is happening until it is too late.  Make sure your health care provider performs a complete foot exam at least annually or more often if you have foot problems. Report any cuts, sores, or bruises to your health care provider immediately. SEEK MEDICAL CARE IF:   You have an injury that is not healing.  You have cuts or breaks in the skin.  You have an ingrown nail.  You notice redness on your legs or feet.  You feel burning or tingling in your legs or feet.  You have pain or cramps in your legs and feet.  Your legs or feet are numb.  Your feet always feel cold. SEEK IMMEDIATE MEDICAL CARE IF:   There is increasing redness, swelling, or pain in or around a wound.  There is a red line that goes up your leg.  Pus is coming from a wound.  You develop a fever or as directed by your health care provider.  You notice a bad smell coming from an ulcer or wound.   This information is not intended to replace advice given to you by your health care provider. Make sure you discuss any questions you have with your health care provider.   Document Released: 03/08/2000 Document Revised: 11/11/2012 Document Reviewed: 08/18/2012 Elsevier Interactive Patient Education [employer].

## 2015-07-25 NOTE — Progress Notes (Signed)
Patient ID: [name], female   DOB: 03-Oct-1929, 82 y.o.   MRN: [id number]  Subjective: This patient presents today for scheduled visit complaining of painful and thickened toenails that are uncomfortable when walking and wearing shoes and a painful plantar callus on the right foot  Objective: No open skin lesions bilaterally The toenails are hypertrophic, elongated, incurvated, discolored, deformed and tender direct palpation 6-10 Plantar callus sub-first MPJ right  Assessment: Symptomatic onychomycoses 6-10 Plantar keratoses 1 Diabetic  Plan: Debridement toenails 6-10 mechanically and electronically with bleeding distal fourth left toe, treated with topical antibiotic ointment and Band-Aid. Patient advised removed Band-Aid 1-3 days and continue applying topical antibiotic ointment daily until a scab for Debride plantar keratoses right without any bleeding  Reappoint 3 month

## 2015-07-27 DIAGNOSIS — R41841 Cognitive communication deficit: Secondary | ICD-10-CM | POA: Diagnosis not present

## 2015-07-31 DIAGNOSIS — M1711 Unilateral primary osteoarthritis, right knee: Secondary | ICD-10-CM | POA: Diagnosis not present

## 2015-08-03 DIAGNOSIS — R41841 Cognitive communication deficit: Secondary | ICD-10-CM | POA: Diagnosis not present

## 2015-08-07 DIAGNOSIS — M1712 Unilateral primary osteoarthritis, left knee: Secondary | ICD-10-CM | POA: Diagnosis not present

## 2015-08-14 DIAGNOSIS — M1711 Unilateral primary osteoarthritis, right knee: Secondary | ICD-10-CM | POA: Diagnosis not present

## 2015-09-06 DIAGNOSIS — M170 Bilateral primary osteoarthritis of knee: Secondary | ICD-10-CM | POA: Diagnosis not present

## 2015-09-27 ENCOUNTER — Telehealth: Payer: Self-pay

## 2015-09-27 NOTE — Telephone Encounter (Signed)
Message sent to front desk to have pt scheduled for Neuropsych testing.

## 2015-09-27 NOTE — Telephone Encounter (Signed)
-----   Message from Amada Kingfisher, [place] sent at 06/22/2015  4:12 PM EDT ----- Neuropsych testingin Oct

## 2015-10-10 DIAGNOSIS — N390 Urinary tract infection, site not specified: Secondary | ICD-10-CM | POA: Diagnosis not present

## 2015-10-10 DIAGNOSIS — Z86718 Personal history of other venous thrombosis and embolism: Secondary | ICD-10-CM | POA: Diagnosis not present

## 2015-10-10 DIAGNOSIS — E871 Hypo-osmolality and hyponatremia: Secondary | ICD-10-CM | POA: Diagnosis not present

## 2015-10-10 DIAGNOSIS — I129 Hypertensive chronic kidney disease with stage 1 through stage 4 chronic kidney disease, or unspecified chronic kidney disease: Secondary | ICD-10-CM | POA: Diagnosis not present

## 2015-10-10 DIAGNOSIS — E039 Hypothyroidism, unspecified: Secondary | ICD-10-CM | POA: Diagnosis not present

## 2015-10-10 DIAGNOSIS — N183 Chronic kidney disease, stage 3 (moderate): Secondary | ICD-10-CM | POA: Diagnosis not present

## 2015-10-10 DIAGNOSIS — L989 Disorder of the skin and subcutaneous tissue, unspecified: Secondary | ICD-10-CM | POA: Diagnosis not present

## 2015-10-10 DIAGNOSIS — E785 Hyperlipidemia, unspecified: Secondary | ICD-10-CM | POA: Diagnosis not present

## 2015-10-12 DIAGNOSIS — H26491 Other secondary cataract, right eye: Secondary | ICD-10-CM | POA: Diagnosis not present

## 2015-10-16 ENCOUNTER — Other Ambulatory Visit: Payer: Self-pay | Admitting: Neurology

## 2015-10-19 ENCOUNTER — Ambulatory Visit (INDEPENDENT_AMBULATORY_CARE_PROVIDER_SITE_OTHER): Payer: Medicare Other | Admitting: Podiatry

## 2015-10-19 ENCOUNTER — Encounter: Payer: Self-pay | Admitting: Podiatry

## 2015-10-19 DIAGNOSIS — M79676 Pain in unspecified toe(s): Secondary | ICD-10-CM | POA: Diagnosis not present

## 2015-10-19 DIAGNOSIS — L84 Corns and callosities: Secondary | ICD-10-CM | POA: Diagnosis not present

## 2015-10-19 DIAGNOSIS — B351 Tinea unguium: Secondary | ICD-10-CM | POA: Diagnosis not present

## 2015-10-19 NOTE — Progress Notes (Signed)
Complaint:  Visit Type: Patient returns to my office for continued preventative foot care services. Complaint: Patient states" my nails have grown long and thick and become painful to walk and wear shoes" .  Marland Kitchen The patient presents for preventative foot care services. No changes to ROS.  Painful callus under big toe joint right foot.  Podiatric Exam: Vascular: dorsalis pedis and posterior tibial pulses are palpable bilateral. Capillary return is immediate. Temperature gradient is WNL. Skin turgor WNL  Sensorium: Normal Semmes Weinstein monofilament test. Normal tactile sensation bilaterally. Nail Exam: Pt has thick disfigured discolored nails with subungual debris noted bilateral entire nail hallux through fifth toenails Ulcer Exam: There is no evidence of ulcer or pre-ulcerative changes or infection. Orthopedic Exam: Muscle tone and strength are WNL. No limitations in general ROM. No crepitus or effusions noted. Foot type and digits show no abnormalities. Bony prominences are unremarkable. Skin: No Porokeratosis. No infection or ulcers.  Callus sub 1st MPJ  B/L  Diagnosis:  Onychomycosis, , Pain in right toe, pain in left toes,  Callus right foot  Treatment & Plan Procedures and Treatment: Consent by patient was obtained for treatment procedures. The patient understood the discussion of treatment and procedures well. All questions were answered thoroughly reviewed. Debridement of mycotic and hypertrophic toenails, 1 through 5 bilateral and clearing of subungual debris. No ulceration, no infection noted. Debride callus. Return Visit-Office Procedure: Patient instructed to return to the office for a follow up visit 3 months for continued evaluation and treatment.    Gardiner Barefoot DPM

## 2015-10-24 ENCOUNTER — Ambulatory Visit: Payer: Medicare Other | Admitting: Podiatry

## 2015-11-14 DIAGNOSIS — H26491 Other secondary cataract, right eye: Secondary | ICD-10-CM | POA: Diagnosis not present

## 2015-11-30 ENCOUNTER — Other Ambulatory Visit: Payer: Self-pay | Admitting: Interventional Cardiology

## 2015-12-07 ENCOUNTER — Other Ambulatory Visit: Payer: Self-pay | Admitting: Neurology

## 2015-12-12 ENCOUNTER — Other Ambulatory Visit: Payer: Self-pay | Admitting: Neurology

## 2015-12-12 NOTE — Telephone Encounter (Signed)
Rx sent to pharmacy   

## 2015-12-26 ENCOUNTER — Ambulatory Visit (INDEPENDENT_AMBULATORY_CARE_PROVIDER_SITE_OTHER): Payer: Medicare Other | Admitting: Psychology

## 2015-12-26 ENCOUNTER — Encounter (INDEPENDENT_AMBULATORY_CARE_PROVIDER_SITE_OTHER): Payer: Self-pay

## 2015-12-26 ENCOUNTER — Ambulatory Visit (INDEPENDENT_AMBULATORY_CARE_PROVIDER_SITE_OTHER): Payer: Medicare Other | Admitting: Interventional Cardiology

## 2015-12-26 ENCOUNTER — Encounter: Payer: Self-pay | Admitting: Psychology

## 2015-12-26 ENCOUNTER — Encounter: Payer: Self-pay | Admitting: Interventional Cardiology

## 2015-12-26 VITALS — BP 150/64 | HR 71 | Ht 65.0 in | Wt 226.8 lb

## 2015-12-26 DIAGNOSIS — I10 Essential (primary) hypertension: Secondary | ICD-10-CM | POA: Diagnosis not present

## 2015-12-26 DIAGNOSIS — I480 Paroxysmal atrial fibrillation: Secondary | ICD-10-CM

## 2015-12-26 DIAGNOSIS — Z7901 Long term (current) use of anticoagulants: Secondary | ICD-10-CM

## 2015-12-26 DIAGNOSIS — F0390 Unspecified dementia without behavioral disturbance: Secondary | ICD-10-CM | POA: Diagnosis not present

## 2015-12-26 DIAGNOSIS — F32A Depression, unspecified: Secondary | ICD-10-CM

## 2015-12-26 DIAGNOSIS — F329 Major depressive disorder, single episode, unspecified: Secondary | ICD-10-CM

## 2015-12-26 NOTE — Patient Instructions (Signed)

## 2015-12-26 NOTE — Progress Notes (Signed)
NEUROPSYCHOLOGICAL INTERVIEW (CPT: [id number])  Name: [name] Date of Birth: [birth year] Date of Interview: 12/26/2015  Reason for Referral:  [name] is an 82 y.o. female who is referred for neuropsychological re-evaluation by Dr. Metta Clines of [facility] due to concerns about progressive memory decline. This patient is accompanied in the office by her daughter who supplements the history.  History of Presenting Problem:  [name] completed a neuropsychological evaluation with me in October 2016. At that time, her findings were consistent with mild dementia. The pattern of performance on testing was suggestive of medial-temporal lobe involvement and Alzheimer's disease, but the abrupt onset and fluctuating course reported by the patient and her family is not in line with AD. Furthermore, an MRI of the brain on 02/22/2015 revealed numerous areas of chronic microhemorrhage in brain, possibly related to poorly controlled hypertension or cerebral amyloid. As such, a vascular etiology of her dementia is also considered.  Today, the patient reports that her memory is very bad and still "comes and goes". Her daughter agrees that there continues to be fluctuation of symptoms. However, her daughter feels her speech articulation has improved while her recall abilities have declined in the past year. The patient brings me a daily log which she has kept over the past few months to record her symptoms, and I see that some days she reports her speech/communication is "fine" and other days it is "terrible".   The patient attempted to wean off [name] as it was hypothesized that could be contributing to cognitive symptoms (given acute onset of symptoms around the time of starting [name]), but the pain over her eye returned and this is very bothersome to her so she opted to go back to the full dose of [name].  The patient reported continuing issues with depression, which also comes and goes.  She does feel depressed much of the time but she can still enjoy things. A lot of the time she feels unmotivated to participate in things. However, she has been more engaged in her community and seems to enjoy this.   [name] had a fall in February 2017 and hit her head on a door jam. She had a large laceration to her forehead and required stitches. There was no LOC. CT of the head on 05/10/2015 was negative for intracranial trauma or skull/cervical spine fracture. She has not had any other falls in the past year but continues to struggle with poor balance from time to time. She also continues to report a jerking sensation or tremor in her tongue, which she notices especially when she is reading.    Upon direct questioning, the patient and her daughter reported the following:   Forgetting recent conversations/events: Comes and goes Repeating statements/questions: Some  Misplacing/losing items: yes, has gotten worse Forgetting appointments or other obligations: No problems, uses calendar faithfully Forgetting to take medications: No trouble  Difficulty concentrating: Some difficulty, e.g., when writing  Starting but not finishing tasks: Yes  Word-finding difficulty: Yes, possibly worse per patient, "sometimes I just can't talk".  Writing difficulty: No change except decline in handwriting, reports that her hand "shakes" Spelling difficulty: No change Comprehension difficulty: Yes, sometimes. Eg will have trouble following a movie plot.    Current Functioning: The patient continues to live alone in an apartment in the same retirement community. Most meals are prepared for her but she can make meals in her apartment. She does not drive. She continues to manage her medications, appointments and  finances independently without any reported difficulty.   She has some sleep difficulty. She continues to have vivid dreams which are sometimes upsetting to her. Her appetite is good. She has not had  any hallucinations.   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  Medical History: Past Medical History:  Diagnosis Date  . A-fib ([place])   . Acid reflux   . Anxiety   . Arthritis   . Chronic anticoagulation 03/07/2013   Lovenox 100 QD  . Chronic kidney disease    Stage III  . CVA (cerebral infarction) 1964  . Dementia    Dr Brett Fairy  . Diverticulosis   . DVT (deep venous thrombosis) ([place]) 2007   recurrent w PE s/p [place] filter  . GERD (gastroesophageal reflux disease)    w hiatal hernia endoscopy 2003, Dr Penelope Coop  . Glaucoma   . Hypercholesterolemia   . Hypertension   . Hypoglycemia   . Hypothyroidism    goiter  . IBS (irritable bowel syndrome)   . Junctional bradycardia    resolved with discontinuation of sotalol (2014) after 10 yrs  . Memory loss   . Migraine   . Osteoarthritis    in Bilateral knees and back. -Dr Latanya Maudlin  . Osteoporosis   . Panic attacks   . Phlebitis   . Postherpetic trigeminal neuralgia 10/20/2012  . Pulmonary embolism recurrent    IVC filter  . Shingles    zoster 1990 on the back and on the abdomen in 2007,facial zoster 2006  . Trigeminal neuralgia    r face--Dr Dohmeier  . Unspecified cerebral artery occlusion with cerebral infarction ([place])   . Vitamin D deficiency       Current Medications:  Outpatient Encounter Prescriptions as of 12/26/2015  Medication Sig  . amLODipine (NORVASC) 2.5 MG tablet TAKE 1 TABLET BY MOUTH EVERY DAY  . CARBATROL 100 MG 12 hr capsule TAKE 3 CAPSULES BY MOUTH EVERY MORNING, 3 caps AT NOON, & 3 caps AT BEDTIME. TAKE AFTER MEALS.  Marland Kitchen CARBATROL 100 MG 12 hr capsule TAKE 3 CAPSULES BY MOUTH EVERY MORNING, 2 capsules AT NOON, AND  3 CAPSULES AT BEDTIME AFTER MEALS  . donepezil (ARICEPT) 10 MG tablet TAKE 1 TABLET BY MOUTH AT BEDTIME  . enoxaparin (LOVENOX) 100 MG/ML injection Inject 100 mg into the skin at bedtime.   Marland Kitchen EPINEPHrine (EPIPEN IJ) Inject as directed once as needed. For allergic reactions  . Fe Fum-FA-B Cmp-C-Zn-Mg-Mn-Cu (HEMOCYTE PLUS) 106-1 MG CAPS Take 1 capsule by mouth daily.   . fluticasone (FLONASE) 50 MCG/ACT nasal spray Place 1 spray into left nostril daily as needed for allergies or rhinitis.   . folic acid (FOLVITE) 1 MG tablet Take 1 mg by mouth every morning.   . gabapentin (NEURONTIN) 300 MG capsule Take 1 capsule (300 mg total) by mouth 3 (three) times daily.  Marland Kitchen [name] 300 MG TABS TAKE 1 TABLET BY MOUTH EVERY MORNING, take 1 TABLET AT NOON, AND take 1 TABLET AT BEDTIME  . HYDROcodone-acetaminophen (NORCO/VICODIN) 5-325 MG per tablet Take 1 tablet by mouth every 6 (six) hours as needed for moderate pain.  Marland Kitchen levothyroxine (SYNTHROID, LEVOTHROID) 75 MCG tablet Take 75 mcg by mouth daily before breakfast.   . loratadine (CLARITIN) 10 MG tablet Take 10 mg by mouth  daily.  . meclizine (ANTIVERT) 25 MG tablet Take 25 mg by mouth 2 (two) times daily as needed for dizziness or nausea.   . Multiple Vitamin (MULTIVITAMIN) tablet Take 1 tablet by mouth every morning.   . pravastatin (PRAVACHOL) 40 MG tablet Take 40 mg by mouth every morning.   . RABEprazole (ACIPHEX) 20 MG tablet Take 20 mg by mouth every morning.   . sodium chloride 1 G tablet Take 1 tablet (1 g total) by mouth 2 (two) times daily with a meal.  . Travoprost, BAK Free, (TRAVATAN) 0.004 % SOLN ophthalmic solution Place 1 drop into both eyes at bedtime.  . Vitamin D, Ergocalciferol, (DRISDOL) 50000 UNITS CAPS capsule Take 50,000 Units by mouth every Saturday.    No facility-administered encounter medications on file as of 12/26/2015.      Behavioral Observations:   Appearance: Neatly and appropriately dressed and groomed Gait: Ambulated  with walker, moderate unsteadiness observed Speech: Significant word finding difficulty, jolting speech pattern at times, difficulty articulating at times Thought process: Generally linear although speech difficulties interfere at times Affect: Full, generally euthymic Interpersonal: Pleasant, appropriate   TESTING: There is medical necessity to proceed with neuropsychological assessment as the results will be used to aid in differential diagnosis and clinical decision-making and to inform specific treatment recommendations. The patient was tested one year ago and had a diagnosis of mild dementia but etiology remained somewhat unclear. Per the patient, her daughter and medical records reviewed, there has been a progression of cognitive decline. Retesting will assist in tracking progression of symptoms, identifying etiology of dementia, and making treatment recommendations.   PLAN: The patient will return for a full battery of neuropsychological testing with a psychometrician under my supervision. Education regarding testing procedures was provided. Subsequently, the patient will see this provider for a follow-up session at which time her test performances and my impressions and treatment recommendations will be reviewed in detail.   Full neuropsychological evaluation report to follow.

## 2015-12-26 NOTE — Progress Notes (Signed)
Cardiology Office Note    Date:  12/26/2015   ID:  [name], DOB [birth year], MRN [id number]  PCP:  Vidal Schwalbe, MD  Cardiologist: Sinclair Grooms, MD   Chief Complaint  Patient presents with  . Coronary Artery Disease    History of Present Illness:  [name] is a 82 y.o. female who presents for paroxysmal atrial fibrillation, hypertension, history pulmonary emboli, IVC filter, venous insufficiency, history of dementia, history of CVA, and chronic Lovenox therapy.  She is doing well. She has had no bleeding. She has occasional palpitation. No sustained tachycardia. Occasional right greater than left lower extremity swelling more than usual.   Past Medical History:  Diagnosis Date  . A-fib ([place])   . Acid reflux   . Anxiety   . Arthritis   . Chronic anticoagulation 03/07/2013   Lovenox 100 QD  . Chronic kidney disease    Stage III  . CVA (cerebral infarction) 1964  . Dementia    Dr Brett Fairy  . Diverticulosis   . DVT (deep venous thrombosis) ([place]) 2007   recurrent w PE s/p [place] filter  . GERD (gastroesophageal reflux disease)    w hiatal hernia endoscopy 2003, Dr Penelope Coop  . Glaucoma   . Hypercholesterolemia   . Hypertension   . Hypoglycemia   . Hypothyroidism    goiter  . IBS (irritable bowel syndrome)   . Junctional bradycardia    resolved with discontinuation of sotalol (2014) after 10 yrs  . Memory loss   . Migraine   . Osteoarthritis    in Bilateral knees and back. -Dr Latanya Maudlin  . Osteoporosis   . Panic attacks   . Phlebitis   . Postherpetic trigeminal neuralgia 10/20/2012  . Pulmonary embolism recurrent    IVC filter  . Shingles    zoster 1990 on the back and on the abdomen in 2007,facial zoster 2006  . Trigeminal neuralgia    r face--Dr Dohmeier  . Unspecified cerebral artery occlusion with cerebral infarction   . Vitamin D deficiency     Past Surgical History:  Procedure Laterality Date  . APPENDECTOMY  1945  . biopsies  of breast Bilateral 1971  . blood clots  06/2006   lower abdomen  . CARDIAC CATHETERIZATION  08/2001   A-fib/Palpitations Dr. Daneen Schick  . CHOLECYSTECTOMY  01/1983  . DILATION AND CURETTAGE, DIAGNOSTIC / THERAPEUTIC  1952/1977   x2  . FOOT SURGERY Right 1978   Cyst removed from Right Foot  . [place]  . [place]  . insertion of vena cova filter  2007  . LEG / ANKLE SOFT TISSUE BIOPSY Right    precancerous  . migraine speech impairment  07/1963  . phlebitis  12/2005  . PULMONARY EMBOLISM SURGERY  03/2005  . TONSILLECTOMY     1954    Current Medications: Outpatient Medications Prior to Visit  Medication Sig Dispense Refill  . amLODipine (NORVASC) 2.5 MG tablet TAKE 1 TABLET BY MOUTH EVERY DAY 30 tablet 9  . donepezil (ARICEPT) 10 MG tablet TAKE 1 TABLET BY MOUTH AT BEDTIME 30 tablet 6  . enoxaparin (LOVENOX) 100 MG/ML injection Inject 100 mg into the skin at bedtime.     Marland Kitchen EPINEPHrine (EPIPEN IJ) Inject as directed once as needed. For allergic reactions    . Fe Fum-FA-B Cmp-C-Zn-Mg-Mn-Cu (HEMOCYTE PLUS) 106-1 MG CAPS Take 1 capsule by mouth daily.     . fluticasone (FLONASE) 50 MCG/ACT nasal spray  Place 1 spray into left nostril daily as needed for allergies or rhinitis.   12  . folic acid (FOLVITE) 1 MG tablet Take 1 mg by mouth every morning.     Marland Kitchen HYDROcodone-acetaminophen (NORCO/VICODIN) 5-325 MG per tablet Take 1 tablet by mouth every 6 (six) hours as needed for moderate pain.    Marland Kitchen levothyroxine (SYNTHROID, LEVOTHROID) 75 MCG tablet Take 75 mcg by mouth daily before breakfast.     . loratadine (CLARITIN) 10 MG tablet Take 10 mg by mouth daily.    . meclizine (ANTIVERT) 25 MG tablet Take 25 mg by mouth 2 (two) times daily as needed for dizziness or nausea.   1  . Multiple Vitamin (MULTIVITAMIN) tablet Take 1 tablet by mouth every morning.     . pravastatin (PRAVACHOL) 40 MG tablet Take 40 mg by mouth every morning.     . RABEprazole (ACIPHEX) 20  MG tablet Take 20 mg by mouth every morning.     . sodium chloride 1 G tablet Take 1 tablet (1 g total) by mouth 2 (two) times daily with a meal. 60 tablet 0  . Travoprost, BAK Free, (TRAVATAN) 0.004 % SOLN ophthalmic solution Place 1 drop into both eyes at bedtime.    Marland Kitchen CARBATROL 100 MG 12 hr capsule TAKE 3 CAPSULES BY MOUTH EVERY MORNING, 3 caps AT NOON, & 3 caps AT BEDTIME. TAKE AFTER MEALS. 270 capsule 2  . CARBATROL 100 MG 12 hr capsule TAKE 3 CAPSULES BY MOUTH EVERY MORNING, 2 capsules AT NOON, AND 3 CAPSULES AT BEDTIME AFTER MEALS 240 capsule 1  . gabapentin (NEURONTIN) 300 MG capsule Take 1 capsule (300 mg total) by mouth 3 (three) times daily. 90 capsule 3  . [name] 300 MG TABS TAKE 1 TABLET BY MOUTH EVERY MORNING, take 1 TABLET AT NOON, AND take 1 TABLET AT BEDTIME 270 tablet 0  . Vitamin D, Ergocalciferol, (DRISDOL) 50000 UNITS CAPS capsule Take 50,000 Units by mouth every Saturday.      No facility-administered medications prior to visit.      Allergies:   Benadryl [diphenhydramine hcl]; Eye drops [tetrahydrozoline hcl]; Flu virus vaccine; Iohexol; Shrimp [shellfish allergy]; Voltaren [diclofenac sodium]; Ambien [zolpidem tartrate]; Gluten meal; Travatan z [travoprost]; Ativan [lorazepam]; Hydrocodone; Betadine antibiotic-moisturize [bacitracin-polymyxin b]; Biaxin [clarithromycin]; Celebrex [celecoxib]; Ciprofloxacin hcl; Combigan [brimonidine tartrate-timolol]; Coumadin [warfarin sodium]; Ivp dye [iodinated diagnostic agents]; Levaquin [levofloxacin in d5w]; Naprosyn [naproxen]; Pataday [olopatadine hcl]; Penicillins; Plavix [clopidogrel bisulfate]; and Septra [sulfamethoxazole-trimethoprim]   Social History   Social History  . Marital status: Divorced    Spouse name: N/A  . Number of children: 3  . Years of education: 14   Occupational History  . retired Retired    Armed forces training and education officer   Social History Main Topics  . Smoking status: Never Smoker  . Smokeless tobacco:  Never Used  . Alcohol use No     Comment: very moderate, none  in the last two months  . Drug use: No  . Sexual activity: No   Other Topics Concern  . None   Social History Narrative   Patient is retired and lives at home alone. Patient has a college education.    Patient has three children.   Patient is right-handed.   Patient drinks 1-2 cups of caffeine daily.     Family History:  The patient's family history includes Breast cancer in her maternal aunt; CAD in her mother; CVA in her brother and mother; Colon cancer in her maternal uncle; Diabetes  in her mother; Glaucoma in her mother, other, and other; Heart attack in her brother and father; Hypertension in her brother, father, and mother; Kidney failure in her father; Prostate cancer in her maternal grandfather; Rectal cancer in her maternal uncle.   ROS:   Please see the history of present illness.    Occasional diarrhea, depression, leg swelling, irregular heartbeat.  All other systems reviewed and are negative.   PHYSICAL EXAM:   VS:  BP (!) 150/64   Pulse 71   Ht 5\' 5"  (1.651 m)   Wt 226 lb 12.8 oz (102.9 kg)   BMI 37.74 kg/m    GEN: Well nourished, well developed, in no acute distress  HEENT: normal  Neck: no JVD, carotid bruits, or masses Cardiac: RRR; no murmurs, rubs, or gallops,no edema  Respiratory:  clear to auscultation bilaterally, normal work of breathing GI: soft, nontender, nondistended, + BS MS: no deformity or atrophy  Skin: warm and dry, no rash Neuro:  Alert and Oriented x 3, Strength and sensation are intact Psych: euthymic mood, full affect  Wt Readings from Last 3 Encounters:  12/26/15 226 lb 12.8 oz (102.9 kg)  06/22/15 229 lb (103.9 kg)  12/08/14 226 lb 1.9 oz (102.6 kg)      Studies/Labs Reviewed:   EKG:  EKG  Atrial fibrillation scratch that normal sinus rhythm with occasional PAC. Nonspecific ST-T wave change. No acute abnormality noted.  Recent Labs: 06/22/2015: ALT 9; BUN 28;  Creatinine, Ser 1.26; Hemoglobin 10.1; Platelets 164.0; Potassium 4.6; Sodium 136   Lipid Panel    Component Value Date/Time   CHOL 156 12/04/2013 0615   TRIG 72 12/04/2013 0615   HDL 76 12/04/2013 0615   CHOLHDL 2.1 12/04/2013 0615   VLDL 14 12/04/2013 0615   LDLCALC 66 12/04/2013 0615    Additional studies/ records that were reviewed today include:  No new data    ASSESSMENT:    1. PAF (paroxysmal atrial fibrillation) ([place])   2. Essential hypertension   3. Chronic anticoagulation      PLAN:  In order of problems listed above:  1. No evidence of atrial fibrillation on EKG. No clinical history to suspect atrial fibrillation. 2. Blood pressure is under adequate control. We discussed low salt diet. Blood pressure target 140/90 year last period 3. No bleeding on Lovenox.    Medication Adjustments/Labs and Tests Ordered: Current medicines are reviewed at length with the patient today.  Concerns regarding medicines are outlined above.  Medication changes, Labs and Tests ordered today are listed in the Patient Instructions below. Patient Instructions  Medication Instructions:  Your physician recommends that you continue on your current medications as directed. Please refer to the Current Medication list given to you today.   Labwork: none  Testing/Procedures: none  Follow-Up: Your physician wants you to follow-up in: 12 months.  You will receive a reminder letter in the mail two months in advance. If you don't receive a letter, please call our office to schedule the follow-up appointment.   Any Other Special Instructions Will Be Listed Below (If Applicable).     If you need a refill on your cardiac medications before your next appointment, please call your pharmacy.      Signed, Sinclair Grooms, MD  12/26/2015 3:35 PM    [facility] [address], [place], [place]  [address] Phone: [phone]; Fax: [phone]

## 2016-01-02 ENCOUNTER — Ambulatory Visit (INDEPENDENT_AMBULATORY_CARE_PROVIDER_SITE_OTHER): Payer: Medicare Other | Admitting: Psychology

## 2016-01-02 DIAGNOSIS — F0390 Unspecified dementia without behavioral disturbance: Secondary | ICD-10-CM | POA: Diagnosis not present

## 2016-01-02 NOTE — Progress Notes (Signed)
   Neuropsychology Note  [name] returned today for 2 hours of neuropsychological testing with technician, [name], BS, under the supervision of Dr. Macarthur Critchley. The patient did not appear overtly distressed by the testing session, per behavioral observation or via self-report to the technician. Rest breaks were offered. [name] will return within 2 weeks for a feedback session with Dr. Si Raider at which time her test performances, clinical impressions and treatment recommendations will be reviewed in detail. The patient understands she can contact our office should she require our assistance before this time.  Full report to follow.

## 2016-01-05 NOTE — Progress Notes (Signed)
NEUROPSYCHOLOGICAL EVALUATION   Name:    [name]  Date of Birth:   [birth year] Date of Interview:  12/26/2015 Date of Testing:  01/02/2016  Date of Feedback:  01/09/2016    Background Information:  Reason for Referral:  [name] is a 82 y.o. female referred by Dr. Metta Clines for neuropsychological re-evaluation, to assess her current level of cognitive functioning and assist in differential diagnosis. The current evaluation consisted of a review of available medical records, an interview with the patient and her daughter, and the completion of a neuropsychological testing battery. Informed consent was obtained.  History of Presenting Problem:  [name] completed a neuropsychological evaluation with me in October 2016. At that time, her findings were consistent with mild dementia. The pattern of performance on testing was suggestive of medial-temporal lobe involvement and Alzheimer's disease, but the abrupt onset and fluctuating course reported by the patient and her family was not in line with AD. Furthermore, an MRI of the brain on 02/22/2015 revealed numerous areas of chronic microhemorrhage in brain, possibly related to poorly controlled hypertension or cerebral amyloid. As such, a vascular etiology of her dementia was also considered.  Today, the patient reports that her memory is very bad and still "comes and goes". Her daughter agrees that there continues to be fluctuation of symptoms. However, her daughter feels her speech articulation has improved while her recall abilities have declined in the past year. The patient brings me a daily log which she has kept over the past few months to record her symptoms, and I see that some days she reports her speech/communication is "fine" and other days it is "terrible".   The patient attempted to wean off [name] as it was hypothesized that could be contributing to cognitive symptoms (given acute onset of symptoms around the  time of starting [name]), but the pain over her eye returned and this is very bothersome to her so she opted to go back to the full dose of [name].  The patient reported continuing issues with depression, which also comes and goes. She does feel depressed much of the time but she can still enjoy things. A lot of the time she feels unmotivated to participate in activities. However, she has been more engaged in her community overall and seems to enjoy this.   [name] had a fall in February 2017 and hit her head on a door jam. She had a large laceration to her forehead and required stitches. There was no LOC. CT of the head on 05/10/2015 was negative for intracranial trauma or skull/cervical spine fracture. She has not had any other falls in the past year but continues to struggle with poor balance from time to time. She also continues to report a jerking sensation or tremor in her tongue, which she notices especially when she is reading.    Upon direct questioning, the patient and her daughter reported the following:   Forgetting recent conversations/events: Comes and goes Repeating statements/questions: Some  Misplacing/losing items: yes, has gotten worse Forgetting appointments or other obligations: No problems, uses calendar faithfully Forgetting to take medications: No trouble  Difficulty concentrating: Some difficulty, e.g., when writing  Starting but not finishing tasks: Yes  Word-finding difficulty: Yes, possibly worse per patient, "sometimes I just can't talk".  Writing difficulty: No change except decline in handwriting, reports that her hand "shakes" Spelling difficulty: No change Comprehension difficulty: Yes, sometimes. Eg will have trouble following a movie plot.  Current Functioning: The patient continues to live alone in an apartment in the same retirement community. Most meals are prepared for her but she can make meals in her apartment. She does not drive. She  continues to manage her medications, appointments and finances independently without any reported difficulty.   She has some sleep difficulty. She continues to have vivid dreams which are sometimes upsetting to her. Her appetite is good. She has not had any hallucinations.   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   Medical History:  Past Medical History:  Diagnosis Date  . A-fib ([place])   . Acid reflux   . Anxiety   . Arthritis   . Chronic anticoagulation 03/07/2013   Lovenox 100 QD  . Chronic kidney disease    Stage III  . CVA (cerebral infarction) 1964  . Dementia    Dr Brett Fairy  . Diverticulosis   . DVT (deep venous thrombosis) ([place]) 2007   recurrent w PE s/p [place] filter  . GERD (gastroesophageal reflux disease)    w hiatal hernia endoscopy 2003, Dr Penelope Coop  . Glaucoma   . Hypercholesterolemia   . Hypertension   . Hypoglycemia   . Hypothyroidism    goiter  . IBS (irritable bowel syndrome)   . Junctional bradycardia    resolved with discontinuation of sotalol (2014) after 10 yrs  . Memory loss   . Migraine   . Osteoarthritis    in Bilateral knees and back. -Dr Latanya Maudlin  . Osteoporosis   . Panic attacks   . Phlebitis   . Postherpetic trigeminal neuralgia 10/20/2012  . Pulmonary embolism recurrent    IVC filter  . Shingles    zoster 1990 on the back and on the abdomen in 2007,facial zoster 2006  . Trigeminal neuralgia    r face--Dr Dohmeier  . Unspecified cerebral artery occlusion with cerebral infarction   . Vitamin D deficiency     Current medications:  Outpatient Encounter Prescriptions as of 01/09/2016  Medication Sig  . amLODipine (NORVASC) 2.5 MG tablet TAKE 1  TABLET BY MOUTH EVERY DAY  . CARBATROL 100 MG 12 hr capsule Take 200 mg by mouth 3 (three) times daily.  . cholecalciferol (VITAMIN D) 1000 units tablet Take 1,000 Units by mouth daily.  . diclofenac sodium (VOLTAREN) 1 % GEL Apply 2 g topically 2 (two) times daily.  Marland Kitchen donepezil (ARICEPT) 10 MG tablet TAKE 1 TABLET BY MOUTH AT BEDTIME  . enoxaparin (LOVENOX) 100 MG/ML injection Inject 100 mg into the skin at bedtime.   Marland Kitchen EPINEPHrine (EPIPEN IJ) Inject as directed once as needed. For allergic reactions  . Fe Fum-FA-B Cmp-C-Zn-Mg-Mn-Cu (HEMOCYTE PLUS) 106-1 MG CAPS Take 1 capsule by mouth daily.   . fluticasone (FLONASE) 50 MCG/ACT nasal spray Place 1 spray into left nostril daily as needed for allergies or rhinitis.   . folic acid (FOLVITE) 1 MG tablet Take 1 mg by mouth every morning.   Marland Kitchen [name] 300 MG TABS Take 300 mg by mouth 3 (three) times daily.  Marland Kitchen HYDROcodone-acetaminophen (NORCO/VICODIN) 5-325 MG per tablet Take 1 tablet by mouth every 6 (six) hours as needed for moderate pain.  Marland Kitchen levothyroxine (SYNTHROID, LEVOTHROID) 75 MCG tablet Take 75 mcg by mouth daily before breakfast.   . loratadine (CLARITIN) 10  MG tablet Take 10 mg by mouth daily.  . meclizine (ANTIVERT) 25 MG tablet Take 25 mg by mouth 2 (two) times daily as needed for dizziness or nausea.   . Multiple Vitamin (MULTIVITAMIN) tablet Take 1 tablet by mouth every morning.   . pravastatin (PRAVACHOL) 40 MG tablet Take 40 mg by mouth every morning.   . RABEprazole (ACIPHEX) 20 MG tablet Take 20 mg by mouth every morning.   . sodium chloride 1 G tablet Take 1 tablet (1 g total) by mouth 2 (two) times daily with a meal.  . Travoprost, BAK Free, (TRAVATAN) 0.004 % SOLN ophthalmic solution Place 1 drop into both eyes at bedtime.   No facility-administered encounter medications on file as of 01/09/2016.      Current Examination:  Behavioral Observations:  Appearance: Neatly and appropriately dressed and groomed Gait: Ambulated  with walker, moderate unsteadiness observed Speech: Significant word finding difficulty, jolting speech pattern at times, difficulty articulating at times Thought process: Generally linear although speech difficulties interfere at times Affect: Full, generally euthymic Interpersonal: Pleasant, appropriate Orientation: Oriented to person, place, day and year. Disoriented to month and date. Accurately named the current President and his predecessor.  Tests Administered: . Wechsler Adult Intelligence Scale-Fourth Edition (WAIS-IV): Similarities, Block Design, Matrix Reasoning, Coding and Digit Span subtests . [employer] Verbal Learning Test - 2nd Edition (CVLT-2) Short Form . Repeatable Battery for the Assessment of Neuropsychological Status (RBANS) Form A:  Figure Copy and Recall subtests, Story Memory and Recall subtests, Fluency subtest . Neuropsychological Assessment Battery (NAB) Language Module, Form 1: Auditory Comprehension and Naming Subtests . Controlled Oral Word Association Test (COWAT) . Trail Making Test A and B . Clock drawing test . Generalized Anxiety Disorder - 7 item screener (GAD-7) . Beck Depression Inventory - Second edition (BDI-II)  Test Results: Note: Standardized scores are presented only for use by appropriately trained professionals and to allow for any future test-retest comparison. These scores should not be interpreted without consideration of all the information that is contained in the rest of the report. The most recent standardization samples from the test publisher or other sources were used whenever possible to derive standard scores; scores were corrected for age, gender, ethnicity and education when available.   Test Scores:  Test Name Standardized Score Descriptor  WAIS-IV Subtests    Similarities ss= 7 Low average  Block Design ss= 8 Average  Matrix Reasoning ss= 8 Average  Coding ss= 9 Average  Digit Span Forward ss= 17 Very superior  Digit Span  Backward ss= 10 Average  RBANS Subtests    Figure Copy Z= -0.7 Average  Figure Recall Z= -2.3 Impaired  Story Memory Z= -1.9 Borderline  Story Recall Z= -2.3 Impaired  Semantic Fluency Z=  -2.8 Severely impaired  CVLT-II Scores    Trial 1 Z= -1 Low average  Trial 4 Z= -1.5 Borderline  Trials 1-4 total T= 32 Borderline  SD Free Recall Z= -2 Impaired  LD Free Recall Z= -1.5 Borderline  LD Cued Recall Z= -2.5 Impaired  Recognition Discriminability (7/9 hits, 3 false positives) Z= -1 Low average  Forced Choice Recognition Raw= 9/9 WNL  NAB Language Subtests    Auditory Comprehension T= 56 Average  Naming T= 19 Severely impaired  COWAT-FAS T= 30 Impaired  COWAT-Animals T= 26 Impaired  Trail Making Test A 0 errors T= 56 Average  Trail Making Test B 2 errors T= 40 Low average  Clock Drawing  Impaired  GAD-7 0/21 WNL  BDI-II 6/63 WNL      Description of Test Results:  Premorbid verbal intellectual abilities were estimated to have been within the high average to superior range based on a test of word reading (administered during her 2016 evaluation) and educational/occupational history. Psychomotor processing speed was average (consistent with 2016). Auditory attention and working memory ranged from superior to average (improved since 2016). Visual-spatial construction was average (consistent with 2016). Language abilities were variable. Specifically, confrontation naming was severely impaired, and semantic verbal fluency was impaired (consistent with 2016). Meanwhile, auditory comprehension was average. With regard to verbal memory, encoding and acquisition of non-contextual information (i.e., word list) was borderline impaired. After a brief distracter task, free recall was impaired. After a delay, free recall was borderline impaired. Cued recall was impaired. Performance on a yes/no recognition task was low average. Overall, her performance on this test was consistent with 2016. On  another verbal memory test, encoding and acquisition of contextual auditory information (i.e., short story) was borderline. After a delay, free recall was impaired. With regard to non-verbal memory, delayed free recall of visual information was impaired (consistent with 2016). Executive functioning was variable. Mental flexibility and set-shifting were low average on Trails B; while she did commit two errors on this task, her time to complete the task was improved relative to 2016. Verbal fluency with phonemic search restrictions was impaired (consistent with 2016). Verbal abstract reasoning was low average (consistent with 2016). Non-verbal abstract reasoning was average. Performance on a clock drawing task was impaired (consistent with 2016). On self-report questionnaires, the patient's responses were not indicative of clinically significant depression/anxiety at the present time.    Clinical Impressions: Mild dementia, unspecified (AD, vascular, or mixed) - stable compared to one year ago. Mild to moderate depression.  Neuropsychological evaluation continues to demonstrate significant deficits in memory (encoding/retrieval and consolidation), expressive language (confrontation naming and verbal fluency) and executive functioning. However, there is no evidence of significant decline in the last year since her first evaluation. The patient's cognitive profile continues to be concerning for both Alzheimer's disease and vascular dementia, and neuroimaging completed 02/22/2015 showed atrophy and chronic microvascular ischemia which was stable. Numerous areas of chronic micro hemorrhage in the brain were noted, stable from previous exam. I continue to suspect that depression exacerbates cognitive deficits associated with underlying dementia.   Recommendations/Plan: Based on the findings of the present evaluation, the following recommendations are offered:  1. Optimal control of vascular risk factors. 2.  She appears to have the appropriate level of support in her current living environment.  3. I am very glad she is engaging/participating more in activities in her community; she was encouraged to continue this, as well as mentally stimulating activities and regular schedule/routine.  4. Treatment for depression with an antidepressant could be considered. Additionally, we discussed the possible benefits of psychotherapy. She is agreeable to seeing a therapist, and I think she will be a good candidate for this. I will refer [facility] to [facility]. 5. Retesting will be necessary only if there is a change in presentation reported or observed, or significant functional decline.   Feedback to Patient: DANEIL LIGHT and her daughter returned for a feedback appointment on 01/09/2016 to review the results of her neuropsychological evaluation with this provider. 30 minutes face-to-face time was spent reviewing her test results, my impressions and my recommendations as detailed above.    Total time spent on this patient's case: [id number] unit for interview with psychologist; [phone] units of  testing by psychometrician under psychologist's supervision; [phone] units for medical record review, scoring of neuropsychological tests, interpretation of test results, preparation of this report, and review of results to the patient by psychologist.      Thank you for your referral of LIHANNA UNRUH. Please feel free to contact me if you have any questions or concerns regarding this report.

## 2016-01-09 ENCOUNTER — Encounter: Payer: Self-pay | Admitting: Psychology

## 2016-01-09 ENCOUNTER — Ambulatory Visit (INDEPENDENT_AMBULATORY_CARE_PROVIDER_SITE_OTHER): Payer: Medicare Other | Admitting: Psychology

## 2016-01-09 DIAGNOSIS — F329 Major depressive disorder, single episode, unspecified: Secondary | ICD-10-CM

## 2016-01-09 DIAGNOSIS — R197 Diarrhea, unspecified: Secondary | ICD-10-CM | POA: Diagnosis not present

## 2016-01-09 DIAGNOSIS — F0390 Unspecified dementia without behavioral disturbance: Secondary | ICD-10-CM | POA: Diagnosis not present

## 2016-01-09 DIAGNOSIS — R350 Frequency of micturition: Secondary | ICD-10-CM | POA: Diagnosis not present

## 2016-01-09 DIAGNOSIS — F32A Depression, unspecified: Secondary | ICD-10-CM

## 2016-01-09 NOTE — Patient Instructions (Addendum)
Your testing results do not show any decline since a year ago -- this is good news!  I am very happy that you are participating in more activities and social interaction in your community. I think that will continue to help your mood and your level of mental stimulation.  Optimal control of hypertension and hypercholesterolemia is important for brain health.  An antidepressant could be considered to help your mood; you can discuss this further with Dr. Tomi Likens if you would like.  I will place a referral to [facility] for counseling. They will contact you to schedule.

## 2016-01-23 DIAGNOSIS — Z86718 Personal history of other venous thrombosis and embolism: Secondary | ICD-10-CM | POA: Diagnosis not present

## 2016-01-23 DIAGNOSIS — N183 Chronic kidney disease, stage 3 (moderate): Secondary | ICD-10-CM | POA: Diagnosis not present

## 2016-01-23 DIAGNOSIS — I129 Hypertensive chronic kidney disease with stage 1 through stage 4 chronic kidney disease, or unspecified chronic kidney disease: Secondary | ICD-10-CM | POA: Diagnosis not present

## 2016-01-23 DIAGNOSIS — L989 Disorder of the skin and subcutaneous tissue, unspecified: Secondary | ICD-10-CM | POA: Diagnosis not present

## 2016-01-23 DIAGNOSIS — E785 Hyperlipidemia, unspecified: Secondary | ICD-10-CM | POA: Diagnosis not present

## 2016-01-24 ENCOUNTER — Ambulatory Visit (INDEPENDENT_AMBULATORY_CARE_PROVIDER_SITE_OTHER): Payer: Medicare Other | Admitting: Podiatry

## 2016-01-24 ENCOUNTER — Encounter: Payer: Self-pay | Admitting: Podiatry

## 2016-01-24 VITALS — BP 145/65 | HR 73 | Temp 96.7°F | Resp 18

## 2016-01-24 DIAGNOSIS — B351 Tinea unguium: Secondary | ICD-10-CM

## 2016-01-24 DIAGNOSIS — L84 Corns and callosities: Secondary | ICD-10-CM | POA: Diagnosis not present

## 2016-01-24 DIAGNOSIS — M79676 Pain in unspecified toe(s): Secondary | ICD-10-CM | POA: Diagnosis not present

## 2016-01-24 NOTE — Progress Notes (Signed)
Patient ID: [name], female   DOB: 05-21-1929, 82 y.o.   MRN: [id number]    Subjective: This patient presents today for scheduled visit complaining of painful and thickened toenails that are uncomfortable when walking and wearing shoes and a painful plantar callus on the right foot. Patient states that she is not diabetic even though this is listed in her medical record  Objective: Orientated 3 DP and PT pulses 2/4 bilaterally Capillary reflex immediate bilaterally Sensation to 10 g monofilament wire intact 4/5 bilaterally Vibratory sensation nonreactive bilaterally Ankle reflex equal and reactive bilaterally No open skin lesions bilaterally The toenails are hypertrophic, elongated, incurvated, discolored, deformed and tender direct palpation 6-10 Plantar callus sub-first MPJ right  Assessment: Symptomatic onychomycoses 6-10 Plantar keratoses 1   Plan: Debridement toenails 6-10 mechanically and electrically without any bleeding Debrided plantar callus 1 without any bleeding  Reappoint 3 months

## 2016-01-24 NOTE — Patient Instructions (Signed)
Diabetes and Foot Care Diabetes may cause you to have problems because of poor blood supply (circulation) to your feet and legs. This may cause the skin on your feet to become thinner, break easier, and heal more slowly. Your skin may become dry, and the skin may peel and crack. You may also have nerve damage in your legs and feet causing decreased feeling in them. You may not notice minor injuries to your feet that could lead to infections or more serious problems. Taking care of your feet is one of the most important things you can do for yourself.  HOME CARE INSTRUCTIONS  Wear shoes at all times, even in the house. Do not go barefoot. Bare feet are easily injured.  Check your feet daily for blisters, cuts, and redness. If you cannot see the bottom of your feet, use a mirror or ask someone for help.  Wash your feet with warm water (do not use hot water) and mild soap. Then pat your feet and the areas between your toes until they are completely dry. Do not soak your feet as this can dry your skin.  Apply a moisturizing lotion or petroleum jelly (that does not contain alcohol and is unscented) to the skin on your feet and to dry, brittle toenails. Do not apply lotion between your toes.  Trim your toenails straight across. Do not dig under them or around the cuticle. File the edges of your nails with an emery board or nail file.  Do not cut corns or calluses or try to remove them with medicine.  Wear clean socks or stockings every day. Make sure they are not too tight. Do not wear knee-high stockings since they may decrease blood flow to your legs.  Wear shoes that fit properly and have enough cushioning. To break in new shoes, wear them for just a few hours a day. This prevents you from injuring your feet. Always look in your shoes before you put them on to be sure there are no objects inside.  Do not cross your legs. This may decrease the blood flow to your feet.  If you find a minor scrape,  cut, or break in the skin on your feet, keep it and the skin around it clean and dry. These areas may be cleansed with mild soap and water. Do not cleanse the area with peroxide, alcohol, or iodine.  When you remove an adhesive bandage, be sure not to damage the skin around it.  If you have a wound, look at it several times a day to make sure it is healing.  Do not use heating pads or hot water bottles. They may burn your skin. If you have lost feeling in your feet or legs, you may not know it is happening until it is too late.  Make sure your health care provider performs a complete foot exam at least annually or more often if you have foot problems. Report any cuts, sores, or bruises to your health care provider immediately. SEEK MEDICAL CARE IF:   You have an injury that is not healing.  You have cuts or breaks in the skin.  You have an ingrown nail.  You notice redness on your legs or feet.  You feel burning or tingling in your legs or feet.  You have pain or cramps in your legs and feet.  Your legs or feet are numb.  Your feet always feel cold. SEEK IMMEDIATE MEDICAL CARE IF:   There is increasing redness,   swelling, or pain in or around a wound.  There is a red line that goes up your leg.  Pus is coming from a wound.  You develop a fever or as directed by your health care provider.  You notice a bad smell coming from an ulcer or wound.   This information is not intended to replace advice given to you by your health care provider. Make sure you discuss any questions you have with your health care provider.   Document Released: 03/08/2000 Document Revised: 11/11/2012 Document Reviewed: 08/18/2012 Elsevier Interactive Patient Education 2016 [employer]  

## 2016-01-24 NOTE — Progress Notes (Signed)
   Subjective:    Patient ID: [name], female    DOB: 1929/12/15, 82 y.o.   MRN: [id number]  HPI   I am here to get my toenails trimmed up and I have a callus on the ball of my right foot and I have a sore toe on my right big toe and is tender and red and there is no draining and I am not a diabetic    Review of Systems  All other systems reviewed and are negative.      Objective:   Physical Exam        Assessment & Plan:

## 2016-01-31 ENCOUNTER — Other Ambulatory Visit: Payer: Self-pay | Admitting: Neurology

## 2016-01-31 NOTE — Telephone Encounter (Signed)
  If she is taking only 100mg  once daily, then have her increase it back to 100mg  three times daily.  If pain persists, we can continue increasing dose from there.

## 2016-02-02 ENCOUNTER — Ambulatory Visit (INDEPENDENT_AMBULATORY_CARE_PROVIDER_SITE_OTHER): Payer: Medicare Other | Admitting: Neurology

## 2016-02-02 ENCOUNTER — Encounter: Payer: Self-pay | Admitting: Neurology

## 2016-02-02 VITALS — BP 146/70 | HR 80 | Ht 65.0 in | Wt 225.0 lb

## 2016-02-02 DIAGNOSIS — B0222 Postherpetic trigeminal neuralgia: Secondary | ICD-10-CM | POA: Diagnosis not present

## 2016-02-02 DIAGNOSIS — F0280 Dementia in other diseases classified elsewhere without behavioral disturbance: Secondary | ICD-10-CM | POA: Diagnosis not present

## 2016-02-02 DIAGNOSIS — F0150 Vascular dementia without behavioral disturbance: Secondary | ICD-10-CM

## 2016-02-02 DIAGNOSIS — F329 Major depressive disorder, single episode, unspecified: Secondary | ICD-10-CM

## 2016-02-02 DIAGNOSIS — G309 Alzheimer's disease, unspecified: Secondary | ICD-10-CM

## 2016-02-02 DIAGNOSIS — F32A Depression, unspecified: Secondary | ICD-10-CM

## 2016-02-02 MED ORDER — MEMANTINE HCL-DONEPEZIL HCL 7 & 14 & 21 &28 -10 MG PO C4PK: 28.0000 mg | EXTENDED_RELEASE_CAPSULE | ORAL | Status: DC

## 2016-02-02 NOTE — Progress Notes (Signed)
NEUROLOGY FOLLOW UP OFFICE NOTE  ABBEGAIL FANG [id number]  HISTORY OF PRESENT ILLNESS: [name] is an 82 year old right-handed woman with history of hypertension, CAD, atrial fibrillation, stroke with right-sided weakness, hypercholesterolemia, diabetes mellitus, DVT and pulmonary embolism (on Lovenox), migraine, herpes zoster, sleep apnea, gait disorder, memory problems and anxiety who follows up for memory problems and right post-herpetic trigeminal neuralgia.  She is accompanied by her daughter who provides some history.  Neuropsych testing report and brain MRI from November reviewed.    I  ATYPICAL RIGHT FACIAL PAIN: History: Onset of symptoms started in 2006, following episode of herpes zoster involving she describes sharp pain involving the the right V1 and V2 distribution, associated with allodynia. Chewing, talking, and brushing her teeth triggers and exacerbates the pain.  She was subsequently treated for postherpetic neuralgia.    Current medication:  Carbatrol 200mg  three times daily, [name] 300mg  three times daily. Past medications:   carbamazepine generic which led to breakthrough pain.     Update: Pain fairly well-controlled. Tried to taper off of Carbatrol but had increased pain.  However, she is on a lower dose.   II.  COGNITIVE DEFICITS: History: This started about a year ago after initiation of gabapentin. It was fairly sudden onset and has not progressed.  She tends to repeat herself often, such as telling stories. She repeats questions often. She reports word-finding difficulties as well.  She does have difficulty using the phone and managing her finances. She typically lives by herself in her one story house. She manages her own finances. She doesn't drive 2 to remote history of blackout spells. She typically does not have problems recognizing people or recalling names. She denies any depression. She denies any hallucinations. She does have history of vivid  nightmares.  She reports a maternal aunt with history of dementia. To evaluate cognitive problems, an MRI of the brain was performed on 12/01/13, which revealed extensive small vessel ischemic changes throughout the deep and subcortical white matter, including remote small infarcts involving the cerebellum and left basal ganglia.  Chronic micro-hemorrhages also noted, most notably in the right frontal lobe.  No acute findings noted.   For several years, she has episodes of recurrent speech hesitancy.  She knows what she wants to say but has trouble getting the words out.  In the past, it has occurred in setting of UTI.   To evaluate episodes of transient speech dysfunction, EEG was performed on 03/10/14, which was normal.  She went to speech therapy without any real improvement.  Therapist thinks it is likely medication-related.   She underwent neuropsychological testing on 01/12/15.  Testing was consistent with mild dementia.  She exhibited cognitive dysfunction involving the medial-temporal lobe, which is suggestive of Alzheimer's disease.  However, family endorses abrupt onset, which correlates with vascular etiology.  Repeat MRI of the brain performed 02/22/15, stable compared to prior imaging from 12/01/13.    Update: She had repeat neurocognitive testing on 01/02/16.  Cognitive deficits/mild dementia stable compared to one year ago. She takes Aricept 10mg  daily.  PAST MEDICAL HISTORY: Past Medical History:  Diagnosis Date  . A-fib ([place])   . Acid reflux   . Anxiety   . Arthritis   . Chronic anticoagulation 03/07/2013   Lovenox 100 QD  . Chronic kidney disease    Stage III  . CVA (cerebral infarction) 1964  . Dementia    Dr Brett Fairy  . Diverticulosis   . DVT (deep venous thrombosis) ([place])  2007   recurrent w PE s/p [place] filter  . GERD (gastroesophageal reflux disease)    w hiatal hernia endoscopy 2003, Dr Penelope Coop  . Glaucoma   . Hypercholesterolemia   . Hypertension   .  Hypoglycemia   . Hypothyroidism    goiter  . IBS (irritable bowel syndrome)   . Junctional bradycardia    resolved with discontinuation of sotalol (2014) after 10 yrs  . Memory loss   . Migraine   . Osteoarthritis    in Bilateral knees and back. -Dr Latanya Maudlin  . Osteoporosis   . Panic attacks   . Phlebitis   . Postherpetic trigeminal neuralgia 10/20/2012  . Pulmonary embolism recurrent    IVC filter  . Shingles    zoster 1990 on the back and on the abdomen in 2007,facial zoster 2006  . Trigeminal neuralgia    r face--Dr Dohmeier  . Unspecified cerebral artery occlusion with cerebral infarction   . Vitamin D deficiency     MEDICATIONS: Current Outpatient Prescriptions on File Prior to Visit  Medication Sig Dispense Refill  . amLODipine (NORVASC) 2.5 MG tablet TAKE 1 TABLET BY MOUTH EVERY DAY 30 tablet 9  . carbamazepine (CARBATROL) 100 MG 12 hr capsule Take 1 capsule (100 mg total) by mouth 3 (three) times daily. 270 capsule 1  . CARBATROL 100 MG 12 hr capsule Take 200 mg by mouth 3 (three) times daily.  3  . cholecalciferol (VITAMIN D) 1000 units tablet Take 1,000 Units by mouth daily.    . diclofenac sodium (VOLTAREN) 1 % GEL Apply 2 g topically 2 (two) times daily.    Marland Kitchen enoxaparin (LOVENOX) 100 MG/ML injection Inject 100 mg into the skin at bedtime.     Marland Kitchen EPINEPHrine (EPIPEN IJ) Inject as directed once as needed. For allergic reactions    . Fe Fum-FA-B Cmp-C-Zn-Mg-Mn-Cu (HEMOCYTE PLUS) 106-1 MG CAPS Take 1 capsule by mouth daily.     . fluticasone (FLONASE) 50 MCG/ACT nasal spray Place 1 spray into left nostril daily as needed for allergies or rhinitis.   12  . folic acid (FOLVITE) 1 MG tablet Take 1 mg by mouth every morning.     Marland Kitchen [name] 300 MG TABS Take 300 mg by mouth 3 (three) times daily.    Marland Kitchen HYDROcodone-acetaminophen (NORCO/VICODIN) 5-325 MG per tablet Take 1 tablet by mouth every 6 (six) hours as needed for moderate pain.    Marland Kitchen levothyroxine (SYNTHROID, LEVOTHROID) 75  MCG tablet Take 75 mcg by mouth daily before breakfast.     . loratadine (CLARITIN) 10 MG tablet Take 10 mg by mouth daily.    . meclizine (ANTIVERT) 25 MG tablet Take 25 mg by mouth 2 (two) times daily as needed for dizziness or nausea.   1  . Multiple Vitamin (MULTIVITAMIN) tablet Take 1 tablet by mouth every morning.     . pravastatin (PRAVACHOL) 40 MG tablet Take 40 mg by mouth every morning.     . RABEprazole (ACIPHEX) 20 MG tablet Take 20 mg by mouth every morning.     . sodium chloride 1 G tablet Take 1 tablet (1 g total) by mouth 2 (two) times daily with a meal. 60 tablet 0  . Travoprost, BAK Free, (TRAVATAN) 0.004 % SOLN ophthalmic solution Place 1 drop into both eyes at bedtime.     No current facility-administered medications on file prior to visit.     ALLERGIES: Allergies  Allergen Reactions  . Benadryl [Diphenhydramine Hcl] Hives and Shortness Of  Breath  . Eye Drops [Tetrahydrozoline Hcl] Other (See Comments)    Burning, itching, swelling  . Flu Virus Vaccine Shortness Of Breath  . Iohexol Hives and Shortness Of Breath     Code: HIVES, Desc: PT IS ALLERGIC TO IVP DYE/IODINE/SHRIMP.  CAUSES SOB AND HIVES PER PT.  STEPHANIE, RT-R, Onset Date: [id number]   . Shrimp [Shellfish Allergy] Anaphylaxis  . Voltaren [Diclofenac Sodium] Other (See Comments)    Acid reflux symptoms  . Ambien [Zolpidem Tartrate] Hives  . Gluten Meal Diarrhea  . Travatan Z [Travoprost] Other (See Comments)    BRAND NAME ONLY OLCANNOT TOLERATE GENERIC FORM OF EYE DROPS, IT CONTAINS PRESERVATIVES-SENSITIVITY  . Ativan [Lorazepam]     delusional  . Hydrocodone     Only on the higher dosages  . Betadine Antibiotic-Moisturize [Bacitracin-Polymyxin B] Rash  . Biaxin [Clarithromycin] Hives  . Celebrex [Celecoxib] Rash  . Ciprofloxacin Hcl Rash       . Combigan [Brimonidine Tartrate-Timolol] Other (See Comments)    unknown  . Coumadin [Warfarin Sodium] Rash  . Ivp Dye [Iodinated Diagnostic Agents]  Hives  . Levaquin [Levofloxacin In D5w] Rash  . Naprosyn [Naproxen] Rash  . Pataday [Olopatadine Hcl] Other (See Comments)  . Penicillins Hives    Has patient had a PCN reaction causing immediate rash, facial/tongue/throat swelling, SOB or lightheadedness with hypotension: Yes Has patient had a PCN reaction causing severe rash involving mucus membranes or skin necrosis: No Has patient had a PCN reaction that required hospitalization No Has patient had a PCN reaction occurring within the last 10 years: No If all of the above answers are "NO", then may proceed with Cephalosporin use.   Marland Kitchen Plavix [Clopidogrel Bisulfate] Rash  . Septra [Sulfamethoxazole-Trimethoprim] Hives    FAMILY HISTORY: Family History  Problem Relation Age of Onset  . Diabetes Mother   . Hypertension Mother   . Glaucoma Mother   . CVA Mother   . CAD Mother   . Hypertension Father   . Heart attack Father   . Kidney failure Father   . Heart attack Brother     2 half brothers  . Hypertension Brother   . CVA Brother   . Glaucoma Other   . Glaucoma Other   . Prostate cancer Maternal Grandfather   . Breast cancer Maternal Aunt   . Colon cancer Maternal Uncle   . Rectal cancer Maternal Uncle     SOCIAL HISTORY: Social History   Social History  . Marital status: Divorced    Spouse name: N/A  . Number of children: 3  . Years of education: 14   Occupational History  . retired Retired    Armed forces training and education officer   Social History Main Topics  . Smoking status: Never Smoker  . Smokeless tobacco: Never Used  . Alcohol use No     Comment: very moderate, none  in the last two months  . Drug use: No  . Sexual activity: No   Other Topics Concern  . Not on file   Social History Narrative   Patient is retired and lives at home alone. Patient has a college education.    Patient has three children.   Patient is right-handed.   Patient drinks 1-2 cups of caffeine daily.    REVIEW OF  SYSTEMS: Constitutional: No fevers, chills, or sweats, no generalized fatigue, change in appetite Eyes: No visual changes, double vision, eye pain Ear, nose and throat: No hearing loss, ear pain, nasal congestion, sore throat Cardiovascular:  No chest pain, palpitations Respiratory:  No shortness of breath at rest or with exertion, wheezes GastrointestinaI: No nausea, vomiting, diarrhea, abdominal pain, fecal incontinence Genitourinary:  No dysuria, urinary retention or frequency Musculoskeletal:  No neck pain, back pain Integumentary: No rash, pruritus, skin lesions Neurological: as above Psychiatric: No depression, insomnia, anxiety Endocrine: No palpitations, fatigue, diaphoresis, mood swings, change in appetite, change in weight, increased thirst Hematologic/Lymphatic:  No purpura, petechiae. Allergic/Immunologic: no itchy/runny eyes, nasal congestion, recent allergic reactions, rashes  PHYSICAL EXAM: Vitals:   02/02/16 1435  BP: (!) 146/70  Pulse: 80   General: No acute distress.  Patient appears well-groomed.   Head:  Normocephalic/atraumatic Eyes:  Fundi examined but not visualized Neck: supple, no paraspinal tenderness, full range of motion Heart:  Regular rate and rhythm Lungs:  Clear to auscultation bilaterally Back: No paraspinal tenderness Neurological Exam: alert and oriented to person, place, and time. Attention span and concentration intact, recent and remote memory intact, fund of knowledge intact.  Speech with periods of pauses but not dysarthric, Some difficulty with naming but overall language intact.  CN II-XII intact. Bulk and tone normal, muscle strength 5/5 throughout.  Sensation to light touch  intact.  Deep tendon reflexes 2+ throughout.  Finger to nose testing intact.  Gait normal  IMPRESSION: Mixed Alzheimer's and vascular dementia Post-herpetic trigeminal neuralgia HTN  PLAN: 1.  Stop Aricept and initiate Namzaric starter pack. 2.  Carbatrol 200mg   three times daily and [name] 300mg  three times daily 3.  Anticoagulation, statin therapy (LDL goal less than 70) and blood pressure control as managed by PCP.  Blood pressure borderline elevated today.  Reassess with PCP. 4.  Mediterranean diet 5.  Follow up in 9 months.  26 minutes spent face to face with patient, over 50% spent counseling.  Metta Clines, DO  CC:  [name], MD

## 2016-02-02 NOTE — Patient Instructions (Signed)
1.  Stop the donepezil.  Instead, you will start Namzaric (which is a combination of donepezil and memantine).  Take the starter pack as directed.  Contact us for refill. 2.  Continue pravastatin 3.  Learn new things or topics (documentaries, books) 4.  Mediterranean diet    Why follow it? Research shows. . Those who follow the Mediterranean diet have a reduced risk of heart disease  . The diet is associated with a reduced incidence of Parkinson's and Alzheimer's diseases . People following the diet may have longer life expectancies and lower rates of chronic diseases  . The Dietary Guidelines for Americans recommends the Mediterranean diet as an eating plan to promote health and prevent disease  What Is the Mediterranean Diet?  . Healthy eating plan based on typical foods and recipes of Mediterranean-style cooking . The diet is primarily a plant based diet; these foods should make up a majority of meals   Starches - Plant based foods should make up a majority of meals - They are an important sources of vitamins, minerals, energy, antioxidants, and fiber - Choose whole grains, foods high in fiber and minimally processed items  - Typical grain sources include wheat, oats, barley, corn, brown rice, bulgar, farro, millet, polenta, couscous  - Various types of beans include chickpeas, lentils, fava beans, black beans, white beans   Fruits  Veggies - Large quantities of antioxidant rich fruits & veggies; 6 or more servings  - Vegetables can be eaten raw or lightly drizzled with oil and cooked  - Vegetables common to the traditional Mediterranean Diet include: artichokes, arugula, beets, broccoli, brussel sprouts, cabbage, carrots, celery, collard greens, cucumbers, eggplant, kale, leeks, lemons, lettuce, mushrooms, okra, onions, peas, peppers, potatoes, pumpkin, radishes, rutabaga, shallots, spinach, sweet potatoes, turnips, zucchini - Fruits common to the Mediterranean Diet include: apples,  apricots, avocados, cherries, clementines, dates, figs, grapefruits, grapes, melons, nectarines, oranges, peaches, pears, pomegranates, strawberries, tangerines  Fats - Replace butter and margarine with healthy oils, such as olive oil, canola oil, and tahini  - Limit nuts to no more than a handful a day  - Nuts include walnuts, almonds, pecans, pistachios, pine nuts  - Limit or avoid candied, honey roasted or heavily salted nuts - Olives are central to the [employer] - can be eaten whole or used in a variety of dishes   Meats Protein - Limiting red meat: no more than a few times a month - When eating red meat: choose lean cuts and keep the portion to the size of deck of cards - Eggs: approx. 0 to 4 times a week  - Fish and lean poultry: at least 2 a week  - Healthy protein sources include, chicken, [place], lean beef, lamb - Increase intake of seafood such as tuna, salmon, trout, mackerel, shrimp, scallops - Avoid or limit high fat processed meats such as sausage and bacon  Dairy - Include moderate amounts of low fat dairy products  - Focus on healthy dairy such as fat free yogurt, skim milk, low or reduced fat cheese - Limit dairy products higher in fat such as whole or 2% milk, cheese, ice cream  Alcohol - Moderate amounts of red wine is ok  - No more than 5 oz daily for women (all ages) and men older than age 50  - No more than 10 oz of wine daily for men younger than 56  Other - Limit sweets and other desserts  - Use herbs and spices instead of salt  to flavor foods  - Herbs and spices common to the traditional Mediterranean Diet include: basil, bay leaves, chives, cloves, cumin, fennel, garlic, lavender, marjoram, mint, oregano, parsley, pepper, rosemary, sage, savory, sumac, tarragon, thyme   It's not just a diet, it's a lifestyle:  . The Mediterranean diet includes lifestyle factors typical of those in the region  . Foods, drinks and meals are best eaten with others and  savored . Daily physical activity is important for overall good health . This could be strenuous exercise like running and aerobics . This could also be more leisurely activities such as walking, housework, yard-work, or taking the stairs . Moderation is the key; a balanced and healthy diet accommodates most foods and drinks . Consider portion sizes and frequency of consumption of certain foods   Meal Ideas & Options:  . Breakfast:  o Whole wheat toast or whole wheat English muffins with peanut butter & hard boiled egg o Steel cut oats topped with apples & cinnamon and skim milk  o Fresh fruit: banana, strawberries, melon, berries, peaches  o Smoothies: strawberries, bananas, greek yogurt, peanut butter o Low fat greek yogurt with blueberries and granola  o Egg white omelet with spinach and mushrooms o Breakfast couscous: whole wheat couscous, apricots, skim milk, cranberries  . Sandwiches:  o Hummus and grilled vegetables (peppers, zucchini, squash) on whole wheat bread   o Grilled chicken on whole wheat pita with lettuce, tomatoes, cucumbers or tzatziki  o Tuna salad on whole wheat bread: tuna salad made with greek yogurt, olives, red peppers, capers, green onions o Garlic rosemary lamb pita: lamb sauted with garlic, rosemary, salt & pepper; add lettuce, cucumber, greek yogurt to pita - flavor with lemon juice and black pepper  . Seafood:  o Mediterranean grilled salmon, seasoned with garlic, basil, parsley, lemon juice and black pepper o Shrimp, lemon, and spinach whole-grain pasta salad made with low fat greek yogurt  o Seared scallops with lemon orzo  o Seared tuna steaks seasoned salt, pepper, coriander topped with tomato mixture of olives, tomatoes, olive oil, minced garlic, parsley, green onions and cappers  . Meats:  o Herbed greek chicken salad with kalamata olives, cucumber, feta  o Red bell peppers stuffed with spinach, bulgur, lean ground beef (or lentils) & topped with feta    o Kebabs: skewers of chicken, tomatoes, onions, zucchini, squash  o [place] burgers: made with red onions, mint, dill, lemon juice, feta cheese topped with roasted red peppers . Vegetarian o Cucumber salad: cucumbers, artichoke hearts, celery, red onion, feta cheese, tossed in olive oil & lemon juice  o Hummus and whole grain pita points with a greek salad (lettuce, tomato, feta, olives, cucumbers, red onion) o Lentil soup with celery, carrots made with vegetable broth, garlic, salt and pepper  o Tabouli salad: parsley, bulgur, mint, scallions, cucumbers, tomato, radishes, lemon juice, olive oil, salt and pepper. 5. Follow up in 9 months.

## 2016-02-13 ENCOUNTER — Other Ambulatory Visit: Payer: Self-pay | Admitting: Neurology

## 2016-02-13 NOTE — Telephone Encounter (Signed)
[name] 300mg  three times daily

## 2016-02-22 ENCOUNTER — Telehealth: Payer: Self-pay | Admitting: Neurology

## 2016-02-22 MED ORDER — MEMANTINE HCL-DONEPEZIL HCL ER 28-10 MG PO CP24: 28.0000 mg | ORAL_CAPSULE | Freq: Every day | ORAL | 3 refills | Status: DC

## 2016-02-22 NOTE — Telephone Encounter (Signed)
Namzaric rx sent to pharmacy. Pt aware

## 2016-02-22 NOTE — Telephone Encounter (Signed)
Patient was told to call us back and let us know about the Namzaric. She is on day 66 and is doing good please call her at [phone]

## 2016-02-29 DIAGNOSIS — H401112 Primary open-angle glaucoma, right eye, moderate stage: Secondary | ICD-10-CM | POA: Diagnosis not present

## 2016-03-05 ENCOUNTER — Ambulatory Visit (INDEPENDENT_AMBULATORY_CARE_PROVIDER_SITE_OTHER): Payer: Medicare Other | Admitting: Licensed Clinical Social Worker

## 2016-03-05 DIAGNOSIS — F330 Major depressive disorder, recurrent, mild: Secondary | ICD-10-CM

## 2016-03-06 DIAGNOSIS — R2681 Unsteadiness on feet: Secondary | ICD-10-CM | POA: Diagnosis not present

## 2016-03-07 DIAGNOSIS — R2681 Unsteadiness on feet: Secondary | ICD-10-CM | POA: Diagnosis not present

## 2016-03-12 DIAGNOSIS — J029 Acute pharyngitis, unspecified: Secondary | ICD-10-CM | POA: Diagnosis not present

## 2016-03-12 DIAGNOSIS — K219 Gastro-esophageal reflux disease without esophagitis: Secondary | ICD-10-CM | POA: Diagnosis not present

## 2016-03-13 DIAGNOSIS — R2681 Unsteadiness on feet: Secondary | ICD-10-CM | POA: Diagnosis not present

## 2016-03-27 ENCOUNTER — Telehealth: Payer: Self-pay | Admitting: Neurology

## 2016-03-27 NOTE — Telephone Encounter (Signed)
Nestor Lewandowsky 2029/07/15. She was needing Dr. Tomi Likens to call [facility] [phone]) regarding her Namzaric medication. She said she needs the Generic called in for her because it will cost her $200. Her # is [phone]. Thank you

## 2016-03-27 NOTE — Telephone Encounter (Signed)
It is not generic.  Let's find out if there is anyway to make it more affordable.  Otherwise, she will have to take Aricept 10mg  at bedtime with Namenda XR 28mg  at bedtime separately

## 2016-03-27 NOTE — Telephone Encounter (Signed)
Called patient to advise not aware of generic for Namzaric, but would fwd to Dr. Tomi Likens for advice.  No answer. Will try later.

## 2016-03-28 NOTE — Telephone Encounter (Signed)
Called patient. No answer. Left vmail.  

## 2016-03-29 ENCOUNTER — Ambulatory Visit: Payer: Self-pay | Admitting: Licensed Clinical Social Worker

## 2016-04-05 DIAGNOSIS — J069 Acute upper respiratory infection, unspecified: Secondary | ICD-10-CM | POA: Diagnosis not present

## 2016-04-17 ENCOUNTER — Ambulatory Visit: Payer: Medicare Other | Admitting: Licensed Clinical Social Worker

## 2016-04-19 ENCOUNTER — Ambulatory Visit (INDEPENDENT_AMBULATORY_CARE_PROVIDER_SITE_OTHER): Payer: Medicare Other | Admitting: Licensed Clinical Social Worker

## 2016-04-19 DIAGNOSIS — F3341 Major depressive disorder, recurrent, in partial remission: Secondary | ICD-10-CM

## 2016-04-23 DIAGNOSIS — I129 Hypertensive chronic kidney disease with stage 1 through stage 4 chronic kidney disease, or unspecified chronic kidney disease: Secondary | ICD-10-CM | POA: Diagnosis not present

## 2016-04-23 DIAGNOSIS — N183 Chronic kidney disease, stage 3 (moderate): Secondary | ICD-10-CM | POA: Diagnosis not present

## 2016-04-23 DIAGNOSIS — E6609 Other obesity due to excess calories: Secondary | ICD-10-CM | POA: Diagnosis not present

## 2016-04-23 DIAGNOSIS — E785 Hyperlipidemia, unspecified: Secondary | ICD-10-CM | POA: Diagnosis not present

## 2016-04-23 DIAGNOSIS — R42 Dizziness and giddiness: Secondary | ICD-10-CM | POA: Diagnosis not present

## 2016-04-23 DIAGNOSIS — F330 Major depressive disorder, recurrent, mild: Secondary | ICD-10-CM | POA: Diagnosis not present

## 2016-04-29 ENCOUNTER — Telehealth: Payer: Self-pay | Admitting: Neurology

## 2016-04-29 NOTE — Telephone Encounter (Signed)
Patient needs to talk to someone about Namzaric cost and the possible side effects of the drugs [phone]

## 2016-04-30 ENCOUNTER — Encounter: Payer: Self-pay | Admitting: Podiatry

## 2016-04-30 ENCOUNTER — Ambulatory Visit (INDEPENDENT_AMBULATORY_CARE_PROVIDER_SITE_OTHER): Payer: Medicare Other | Admitting: Podiatry

## 2016-04-30 DIAGNOSIS — M79676 Pain in unspecified toe(s): Secondary | ICD-10-CM | POA: Diagnosis not present

## 2016-04-30 DIAGNOSIS — B351 Tinea unguium: Secondary | ICD-10-CM

## 2016-04-30 DIAGNOSIS — L84 Corns and callosities: Secondary | ICD-10-CM | POA: Diagnosis not present

## 2016-04-30 NOTE — Telephone Encounter (Signed)
Called patient. No answer. Will try later.  

## 2016-04-30 NOTE — Progress Notes (Signed)
Patient ID: [name], female   DOB: 05-02-1929, 83 y.o.   MRN: [id number]    Subjective: This patient presents today for scheduled visit complaining of painful and thickened toenails that are uncomfortable when walking and wearing shoes and a painful plantar callus on the right foot. Patient states that she is not diabetic even though this is listed in her medical record  Objective: Orientated 3 DP and PT pulses 2/4 bilaterally Capillary reflex immediate bilaterally Sensation to 10 g monofilament wire intact 4/5 bilaterally Vibratory sensation nonreactive bilaterally Ankle reflex equal and reactive bilaterally No open skin lesions bilaterally The toenails are hypertrophic, elongated, incurvated, discolored, deformed and tender direct palpation 6-10 Plantar callus sub-first MPJ right  Assessment: Symptomatic onychomycoses 6-10 Plantar keratoses 1   Plan: Debridement toenails 6-10 mechanically and electrically without any bleeding Debrided plantar callus 1 without any bleeding

## 2016-05-02 NOTE — Telephone Encounter (Signed)
Called patient.  No answer.

## 2016-05-10 DIAGNOSIS — M1712 Unilateral primary osteoarthritis, left knee: Secondary | ICD-10-CM | POA: Diagnosis not present

## 2016-05-10 DIAGNOSIS — M1711 Unilateral primary osteoarthritis, right knee: Secondary | ICD-10-CM | POA: Diagnosis not present

## 2016-05-13 ENCOUNTER — Other Ambulatory Visit: Payer: Self-pay | Admitting: Neurology

## 2016-05-16 ENCOUNTER — Telehealth: Payer: Self-pay | Admitting: Neurology

## 2016-05-16 NOTE — Telephone Encounter (Signed)
PT called about her medication Namzaric and has some questions about side effects and also a generic brand/Dawn CB# [phone]

## 2016-05-17 DIAGNOSIS — M1711 Unilateral primary osteoarthritis, right knee: Secondary | ICD-10-CM | POA: Diagnosis not present

## 2016-05-17 DIAGNOSIS — M1712 Unilateral primary osteoarthritis, left knee: Secondary | ICD-10-CM | POA: Diagnosis not present

## 2016-05-17 MED ORDER — CARBATROL 100 MG PO CP12: 200.0000 mg | ORAL_CAPSULE | Freq: Three times a day (TID) | ORAL | 1 refills | Status: DC

## 2016-05-17 NOTE — Telephone Encounter (Signed)
Returned call. No answer. Will try later. 

## 2016-05-17 NOTE — Telephone Encounter (Signed)
This encounter was created in error - please disregard.

## 2016-05-17 NOTE — Telephone Encounter (Signed)
Spoke to patient. Answered med questions.  Patient would like to continue taking Namzaric just questioning if generic available and if HA common. Patient has only had one HA in 6 months of taking med but thought maybe med could have been the cause. Advised patient to c/b if HA become frequent. Patient verbalized understanding.

## 2016-05-21 ENCOUNTER — Ambulatory Visit (INDEPENDENT_AMBULATORY_CARE_PROVIDER_SITE_OTHER): Payer: Medicare Other | Admitting: Licensed Clinical Social Worker

## 2016-05-21 DIAGNOSIS — F331 Major depressive disorder, recurrent, moderate: Secondary | ICD-10-CM

## 2016-05-24 DIAGNOSIS — M1711 Unilateral primary osteoarthritis, right knee: Secondary | ICD-10-CM | POA: Diagnosis not present

## 2016-05-24 DIAGNOSIS — M1712 Unilateral primary osteoarthritis, left knee: Secondary | ICD-10-CM | POA: Diagnosis not present

## 2016-05-25 ENCOUNTER — Other Ambulatory Visit: Payer: Self-pay | Admitting: Neurology

## 2016-05-28 DIAGNOSIS — I129 Hypertensive chronic kidney disease with stage 1 through stage 4 chronic kidney disease, or unspecified chronic kidney disease: Secondary | ICD-10-CM | POA: Diagnosis not present

## 2016-05-28 DIAGNOSIS — N183 Chronic kidney disease, stage 3 (moderate): Secondary | ICD-10-CM | POA: Diagnosis not present

## 2016-05-28 DIAGNOSIS — F330 Major depressive disorder, recurrent, mild: Secondary | ICD-10-CM | POA: Diagnosis not present

## 2016-05-29 DIAGNOSIS — Z1231 Encounter for screening mammogram for malignant neoplasm of breast: Secondary | ICD-10-CM | POA: Diagnosis not present

## 2016-06-03 DIAGNOSIS — M1711 Unilateral primary osteoarthritis, right knee: Secondary | ICD-10-CM | POA: Diagnosis not present

## 2016-06-03 DIAGNOSIS — M1712 Unilateral primary osteoarthritis, left knee: Secondary | ICD-10-CM | POA: Diagnosis not present

## 2016-06-10 DIAGNOSIS — M1711 Unilateral primary osteoarthritis, right knee: Secondary | ICD-10-CM | POA: Diagnosis not present

## 2016-06-10 DIAGNOSIS — M1712 Unilateral primary osteoarthritis, left knee: Secondary | ICD-10-CM | POA: Diagnosis not present

## 2016-06-18 ENCOUNTER — Ambulatory Visit (INDEPENDENT_AMBULATORY_CARE_PROVIDER_SITE_OTHER): Payer: Medicare Other | Admitting: Licensed Clinical Social Worker

## 2016-06-18 DIAGNOSIS — F3341 Major depressive disorder, recurrent, in partial remission: Secondary | ICD-10-CM

## 2016-06-18 DIAGNOSIS — F4321 Adjustment disorder with depressed mood: Secondary | ICD-10-CM | POA: Diagnosis not present

## 2016-06-19 DIAGNOSIS — M25562 Pain in left knee: Secondary | ICD-10-CM | POA: Diagnosis not present

## 2016-06-19 DIAGNOSIS — M25561 Pain in right knee: Secondary | ICD-10-CM | POA: Diagnosis not present

## 2016-06-19 DIAGNOSIS — R262 Difficulty in walking, not elsewhere classified: Secondary | ICD-10-CM | POA: Diagnosis not present

## 2016-06-21 DIAGNOSIS — M25562 Pain in left knee: Secondary | ICD-10-CM | POA: Diagnosis not present

## 2016-06-21 DIAGNOSIS — M25561 Pain in right knee: Secondary | ICD-10-CM | POA: Diagnosis not present

## 2016-06-21 DIAGNOSIS — R262 Difficulty in walking, not elsewhere classified: Secondary | ICD-10-CM | POA: Diagnosis not present

## 2016-06-24 DIAGNOSIS — R262 Difficulty in walking, not elsewhere classified: Secondary | ICD-10-CM | POA: Diagnosis not present

## 2016-06-24 DIAGNOSIS — M25562 Pain in left knee: Secondary | ICD-10-CM | POA: Diagnosis not present

## 2016-06-24 DIAGNOSIS — M25561 Pain in right knee: Secondary | ICD-10-CM | POA: Diagnosis not present

## 2016-06-25 DIAGNOSIS — R300 Dysuria: Secondary | ICD-10-CM | POA: Diagnosis not present

## 2016-06-26 DIAGNOSIS — M25561 Pain in right knee: Secondary | ICD-10-CM | POA: Diagnosis not present

## 2016-06-26 DIAGNOSIS — M25562 Pain in left knee: Secondary | ICD-10-CM | POA: Diagnosis not present

## 2016-06-26 DIAGNOSIS — R262 Difficulty in walking, not elsewhere classified: Secondary | ICD-10-CM | POA: Diagnosis not present

## 2016-06-28 DIAGNOSIS — M25561 Pain in right knee: Secondary | ICD-10-CM | POA: Diagnosis not present

## 2016-06-28 DIAGNOSIS — R262 Difficulty in walking, not elsewhere classified: Secondary | ICD-10-CM | POA: Diagnosis not present

## 2016-06-28 DIAGNOSIS — M25562 Pain in left knee: Secondary | ICD-10-CM | POA: Diagnosis not present

## 2016-07-02 DIAGNOSIS — R262 Difficulty in walking, not elsewhere classified: Secondary | ICD-10-CM | POA: Diagnosis not present

## 2016-07-02 DIAGNOSIS — M25562 Pain in left knee: Secondary | ICD-10-CM | POA: Diagnosis not present

## 2016-07-02 DIAGNOSIS — M25561 Pain in right knee: Secondary | ICD-10-CM | POA: Diagnosis not present

## 2016-07-03 DIAGNOSIS — M25562 Pain in left knee: Secondary | ICD-10-CM | POA: Diagnosis not present

## 2016-07-03 DIAGNOSIS — R262 Difficulty in walking, not elsewhere classified: Secondary | ICD-10-CM | POA: Diagnosis not present

## 2016-07-03 DIAGNOSIS — M25561 Pain in right knee: Secondary | ICD-10-CM | POA: Diagnosis not present

## 2016-07-04 DIAGNOSIS — N183 Chronic kidney disease, stage 3 (moderate): Secondary | ICD-10-CM | POA: Diagnosis not present

## 2016-07-04 DIAGNOSIS — F330 Major depressive disorder, recurrent, mild: Secondary | ICD-10-CM | POA: Diagnosis not present

## 2016-07-04 DIAGNOSIS — Z86711 Personal history of pulmonary embolism: Secondary | ICD-10-CM | POA: Diagnosis not present

## 2016-07-04 DIAGNOSIS — I129 Hypertensive chronic kidney disease with stage 1 through stage 4 chronic kidney disease, or unspecified chronic kidney disease: Secondary | ICD-10-CM | POA: Diagnosis not present

## 2016-07-05 DIAGNOSIS — M25562 Pain in left knee: Secondary | ICD-10-CM | POA: Diagnosis not present

## 2016-07-05 DIAGNOSIS — M25561 Pain in right knee: Secondary | ICD-10-CM | POA: Diagnosis not present

## 2016-07-05 DIAGNOSIS — R262 Difficulty in walking, not elsewhere classified: Secondary | ICD-10-CM | POA: Diagnosis not present

## 2016-07-09 DIAGNOSIS — M25561 Pain in right knee: Secondary | ICD-10-CM | POA: Diagnosis not present

## 2016-07-09 DIAGNOSIS — R262 Difficulty in walking, not elsewhere classified: Secondary | ICD-10-CM | POA: Diagnosis not present

## 2016-07-09 DIAGNOSIS — M25562 Pain in left knee: Secondary | ICD-10-CM | POA: Diagnosis not present

## 2016-07-10 ENCOUNTER — Other Ambulatory Visit: Payer: Self-pay | Admitting: Neurology

## 2016-07-11 DIAGNOSIS — M25561 Pain in right knee: Secondary | ICD-10-CM | POA: Diagnosis not present

## 2016-07-11 DIAGNOSIS — M25562 Pain in left knee: Secondary | ICD-10-CM | POA: Diagnosis not present

## 2016-07-11 DIAGNOSIS — R262 Difficulty in walking, not elsewhere classified: Secondary | ICD-10-CM | POA: Diagnosis not present

## 2016-07-12 DIAGNOSIS — M1711 Unilateral primary osteoarthritis, right knee: Secondary | ICD-10-CM | POA: Diagnosis not present

## 2016-07-12 DIAGNOSIS — M1712 Unilateral primary osteoarthritis, left knee: Secondary | ICD-10-CM | POA: Diagnosis not present

## 2016-07-15 DIAGNOSIS — R262 Difficulty in walking, not elsewhere classified: Secondary | ICD-10-CM | POA: Diagnosis not present

## 2016-07-15 DIAGNOSIS — M25561 Pain in right knee: Secondary | ICD-10-CM | POA: Diagnosis not present

## 2016-07-15 DIAGNOSIS — M25562 Pain in left knee: Secondary | ICD-10-CM | POA: Diagnosis not present

## 2016-07-16 ENCOUNTER — Ambulatory Visit (INDEPENDENT_AMBULATORY_CARE_PROVIDER_SITE_OTHER): Payer: Medicare Other | Admitting: Licensed Clinical Social Worker

## 2016-07-16 DIAGNOSIS — M25562 Pain in left knee: Secondary | ICD-10-CM | POA: Diagnosis not present

## 2016-07-16 DIAGNOSIS — F3341 Major depressive disorder, recurrent, in partial remission: Secondary | ICD-10-CM

## 2016-07-16 DIAGNOSIS — R262 Difficulty in walking, not elsewhere classified: Secondary | ICD-10-CM | POA: Diagnosis not present

## 2016-07-16 DIAGNOSIS — M25561 Pain in right knee: Secondary | ICD-10-CM | POA: Diagnosis not present

## 2016-07-18 DIAGNOSIS — M25561 Pain in right knee: Secondary | ICD-10-CM | POA: Diagnosis not present

## 2016-07-18 DIAGNOSIS — R262 Difficulty in walking, not elsewhere classified: Secondary | ICD-10-CM | POA: Diagnosis not present

## 2016-07-18 DIAGNOSIS — M25562 Pain in left knee: Secondary | ICD-10-CM | POA: Diagnosis not present

## 2016-07-22 DIAGNOSIS — M25562 Pain in left knee: Secondary | ICD-10-CM | POA: Diagnosis not present

## 2016-07-22 DIAGNOSIS — R262 Difficulty in walking, not elsewhere classified: Secondary | ICD-10-CM | POA: Diagnosis not present

## 2016-07-22 DIAGNOSIS — M25561 Pain in right knee: Secondary | ICD-10-CM | POA: Diagnosis not present

## 2016-07-23 DIAGNOSIS — H01001 Unspecified blepharitis right upper eyelid: Secondary | ICD-10-CM | POA: Diagnosis not present

## 2016-07-23 DIAGNOSIS — R262 Difficulty in walking, not elsewhere classified: Secondary | ICD-10-CM | POA: Diagnosis not present

## 2016-07-23 DIAGNOSIS — M25562 Pain in left knee: Secondary | ICD-10-CM | POA: Diagnosis not present

## 2016-07-23 DIAGNOSIS — H04123 Dry eye syndrome of bilateral lacrimal glands: Secondary | ICD-10-CM | POA: Diagnosis not present

## 2016-07-23 DIAGNOSIS — H02055 Trichiasis without entropian left lower eyelid: Secondary | ICD-10-CM | POA: Diagnosis not present

## 2016-07-23 DIAGNOSIS — M25561 Pain in right knee: Secondary | ICD-10-CM | POA: Diagnosis not present

## 2016-07-23 DIAGNOSIS — H02052 Trichiasis without entropian right lower eyelid: Secondary | ICD-10-CM | POA: Diagnosis not present

## 2016-07-23 DIAGNOSIS — R41841 Cognitive communication deficit: Secondary | ICD-10-CM | POA: Diagnosis not present

## 2016-07-25 DIAGNOSIS — R41841 Cognitive communication deficit: Secondary | ICD-10-CM | POA: Diagnosis not present

## 2016-07-25 DIAGNOSIS — M25561 Pain in right knee: Secondary | ICD-10-CM | POA: Diagnosis not present

## 2016-07-25 DIAGNOSIS — M25562 Pain in left knee: Secondary | ICD-10-CM | POA: Diagnosis not present

## 2016-07-25 DIAGNOSIS — R262 Difficulty in walking, not elsewhere classified: Secondary | ICD-10-CM | POA: Diagnosis not present

## 2016-07-30 ENCOUNTER — Encounter: Payer: Self-pay | Admitting: Podiatry

## 2016-07-30 ENCOUNTER — Ambulatory Visit (INDEPENDENT_AMBULATORY_CARE_PROVIDER_SITE_OTHER): Payer: Medicare Other | Admitting: Podiatry

## 2016-07-30 DIAGNOSIS — M79676 Pain in unspecified toe(s): Secondary | ICD-10-CM

## 2016-07-30 DIAGNOSIS — L84 Corns and callosities: Secondary | ICD-10-CM | POA: Diagnosis not present

## 2016-07-30 DIAGNOSIS — R41841 Cognitive communication deficit: Secondary | ICD-10-CM | POA: Diagnosis not present

## 2016-07-30 DIAGNOSIS — R262 Difficulty in walking, not elsewhere classified: Secondary | ICD-10-CM | POA: Diagnosis not present

## 2016-07-30 DIAGNOSIS — M25562 Pain in left knee: Secondary | ICD-10-CM | POA: Diagnosis not present

## 2016-07-30 DIAGNOSIS — M25561 Pain in right knee: Secondary | ICD-10-CM | POA: Diagnosis not present

## 2016-07-30 DIAGNOSIS — B351 Tinea unguium: Secondary | ICD-10-CM

## 2016-07-30 NOTE — Progress Notes (Signed)
Patient ID: [name], female   DOB: [birth year], 83 y.o.   MRN: [id number]    Subjective: This patient presents today for scheduled visit complaining of painful and thickened toenails that are uncomfortable when walking and wearing shoes and a painful plantar callus on the right foot.Patient states that  She is currently under treatment for dementia.  Objective: Orientated 3 DP and PT pulses 2/4 bilaterally Capillary reflex immediate bilaterally Sensation to 10 g monofilament wire intact 4/5 bilaterally Vibratory sensation nonreactive bilaterally Ankle reflex equal and reactive bilaterally No open skin lesions bilaterally The toenails are hypertrophic, elongated, incurvated, discolored, deformed and tender direct palpation 6-10 Plantar callus sub-first MPJ right Patient walks slowly with walker  Assessment: Symptomatic onychomycoses 6-10 Plantar keratoses 1   Plan: Debridement toenails 6-10 mechanically and electrically without any bleeding Debrided plantar callus 1 without any bleeding  Reappoint as 3 months

## 2016-08-02 DIAGNOSIS — H02055 Trichiasis without entropian left lower eyelid: Secondary | ICD-10-CM | POA: Diagnosis not present

## 2016-08-02 DIAGNOSIS — Z961 Presence of intraocular lens: Secondary | ICD-10-CM | POA: Diagnosis not present

## 2016-08-02 DIAGNOSIS — H401132 Primary open-angle glaucoma, bilateral, moderate stage: Secondary | ICD-10-CM | POA: Diagnosis not present

## 2016-08-06 DIAGNOSIS — R41841 Cognitive communication deficit: Secondary | ICD-10-CM | POA: Diagnosis not present

## 2016-08-06 DIAGNOSIS — R262 Difficulty in walking, not elsewhere classified: Secondary | ICD-10-CM | POA: Diagnosis not present

## 2016-08-06 DIAGNOSIS — M25562 Pain in left knee: Secondary | ICD-10-CM | POA: Diagnosis not present

## 2016-08-06 DIAGNOSIS — M25561 Pain in right knee: Secondary | ICD-10-CM | POA: Diagnosis not present

## 2016-08-08 DIAGNOSIS — M25562 Pain in left knee: Secondary | ICD-10-CM | POA: Diagnosis not present

## 2016-08-08 DIAGNOSIS — M25561 Pain in right knee: Secondary | ICD-10-CM | POA: Diagnosis not present

## 2016-08-08 DIAGNOSIS — R262 Difficulty in walking, not elsewhere classified: Secondary | ICD-10-CM | POA: Diagnosis not present

## 2016-08-08 DIAGNOSIS — R41841 Cognitive communication deficit: Secondary | ICD-10-CM | POA: Diagnosis not present

## 2016-08-12 DIAGNOSIS — M25561 Pain in right knee: Secondary | ICD-10-CM | POA: Diagnosis not present

## 2016-08-12 DIAGNOSIS — R262 Difficulty in walking, not elsewhere classified: Secondary | ICD-10-CM | POA: Diagnosis not present

## 2016-08-12 DIAGNOSIS — M25562 Pain in left knee: Secondary | ICD-10-CM | POA: Diagnosis not present

## 2016-08-12 DIAGNOSIS — R41841 Cognitive communication deficit: Secondary | ICD-10-CM | POA: Diagnosis not present

## 2016-08-13 DIAGNOSIS — M25561 Pain in right knee: Secondary | ICD-10-CM | POA: Diagnosis not present

## 2016-08-13 DIAGNOSIS — R262 Difficulty in walking, not elsewhere classified: Secondary | ICD-10-CM | POA: Diagnosis not present

## 2016-08-13 DIAGNOSIS — R41841 Cognitive communication deficit: Secondary | ICD-10-CM | POA: Diagnosis not present

## 2016-08-13 DIAGNOSIS — M25562 Pain in left knee: Secondary | ICD-10-CM | POA: Diagnosis not present

## 2016-08-14 DIAGNOSIS — R262 Difficulty in walking, not elsewhere classified: Secondary | ICD-10-CM | POA: Diagnosis not present

## 2016-08-14 DIAGNOSIS — M25561 Pain in right knee: Secondary | ICD-10-CM | POA: Diagnosis not present

## 2016-08-14 DIAGNOSIS — M25562 Pain in left knee: Secondary | ICD-10-CM | POA: Diagnosis not present

## 2016-08-14 DIAGNOSIS — R41841 Cognitive communication deficit: Secondary | ICD-10-CM | POA: Diagnosis not present

## 2016-08-16 DIAGNOSIS — M25561 Pain in right knee: Secondary | ICD-10-CM | POA: Diagnosis not present

## 2016-08-16 DIAGNOSIS — R262 Difficulty in walking, not elsewhere classified: Secondary | ICD-10-CM | POA: Diagnosis not present

## 2016-08-16 DIAGNOSIS — M25562 Pain in left knee: Secondary | ICD-10-CM | POA: Diagnosis not present

## 2016-08-16 DIAGNOSIS — R41841 Cognitive communication deficit: Secondary | ICD-10-CM | POA: Diagnosis not present

## 2016-08-20 DIAGNOSIS — R262 Difficulty in walking, not elsewhere classified: Secondary | ICD-10-CM | POA: Diagnosis not present

## 2016-08-20 DIAGNOSIS — R41841 Cognitive communication deficit: Secondary | ICD-10-CM | POA: Diagnosis not present

## 2016-08-20 DIAGNOSIS — M25561 Pain in right knee: Secondary | ICD-10-CM | POA: Diagnosis not present

## 2016-08-20 DIAGNOSIS — M25562 Pain in left knee: Secondary | ICD-10-CM | POA: Diagnosis not present

## 2016-08-21 DIAGNOSIS — M25562 Pain in left knee: Secondary | ICD-10-CM | POA: Diagnosis not present

## 2016-08-21 DIAGNOSIS — R41841 Cognitive communication deficit: Secondary | ICD-10-CM | POA: Diagnosis not present

## 2016-08-21 DIAGNOSIS — M25561 Pain in right knee: Secondary | ICD-10-CM | POA: Diagnosis not present

## 2016-08-21 DIAGNOSIS — R262 Difficulty in walking, not elsewhere classified: Secondary | ICD-10-CM | POA: Diagnosis not present

## 2016-08-22 ENCOUNTER — Ambulatory Visit (INDEPENDENT_AMBULATORY_CARE_PROVIDER_SITE_OTHER): Payer: Medicare Other | Admitting: Licensed Clinical Social Worker

## 2016-08-22 DIAGNOSIS — R262 Difficulty in walking, not elsewhere classified: Secondary | ICD-10-CM | POA: Diagnosis not present

## 2016-08-22 DIAGNOSIS — M25561 Pain in right knee: Secondary | ICD-10-CM | POA: Diagnosis not present

## 2016-08-22 DIAGNOSIS — F3341 Major depressive disorder, recurrent, in partial remission: Secondary | ICD-10-CM

## 2016-08-22 DIAGNOSIS — R41841 Cognitive communication deficit: Secondary | ICD-10-CM | POA: Diagnosis not present

## 2016-08-22 DIAGNOSIS — M25562 Pain in left knee: Secondary | ICD-10-CM | POA: Diagnosis not present

## 2016-08-23 DIAGNOSIS — M1711 Unilateral primary osteoarthritis, right knee: Secondary | ICD-10-CM | POA: Diagnosis not present

## 2016-08-26 DIAGNOSIS — R41841 Cognitive communication deficit: Secondary | ICD-10-CM | POA: Diagnosis not present

## 2016-08-28 DIAGNOSIS — R41841 Cognitive communication deficit: Secondary | ICD-10-CM | POA: Diagnosis not present

## 2016-08-30 DIAGNOSIS — R41841 Cognitive communication deficit: Secondary | ICD-10-CM | POA: Diagnosis not present

## 2016-09-02 DIAGNOSIS — R41841 Cognitive communication deficit: Secondary | ICD-10-CM | POA: Diagnosis not present

## 2016-09-03 DIAGNOSIS — M9904 Segmental and somatic dysfunction of sacral region: Secondary | ICD-10-CM | POA: Diagnosis not present

## 2016-09-03 DIAGNOSIS — M5136 Other intervertebral disc degeneration, lumbar region: Secondary | ICD-10-CM | POA: Diagnosis not present

## 2016-09-03 DIAGNOSIS — F4321 Adjustment disorder with depressed mood: Secondary | ICD-10-CM | POA: Diagnosis not present

## 2016-09-03 DIAGNOSIS — M9903 Segmental and somatic dysfunction of lumbar region: Secondary | ICD-10-CM | POA: Diagnosis not present

## 2016-09-03 DIAGNOSIS — M5137 Other intervertebral disc degeneration, lumbosacral region: Secondary | ICD-10-CM | POA: Diagnosis not present

## 2016-09-04 DIAGNOSIS — E039 Hypothyroidism, unspecified: Secondary | ICD-10-CM | POA: Diagnosis not present

## 2016-09-04 DIAGNOSIS — R29898 Other symptoms and signs involving the musculoskeletal system: Secondary | ICD-10-CM | POA: Diagnosis not present

## 2016-09-04 DIAGNOSIS — I129 Hypertensive chronic kidney disease with stage 1 through stage 4 chronic kidney disease, or unspecified chronic kidney disease: Secondary | ICD-10-CM | POA: Diagnosis not present

## 2016-09-04 DIAGNOSIS — E559 Vitamin D deficiency, unspecified: Secondary | ICD-10-CM | POA: Diagnosis not present

## 2016-09-04 DIAGNOSIS — N183 Chronic kidney disease, stage 3 (moderate): Secondary | ICD-10-CM | POA: Diagnosis not present

## 2016-09-05 DIAGNOSIS — M5137 Other intervertebral disc degeneration, lumbosacral region: Secondary | ICD-10-CM | POA: Diagnosis not present

## 2016-09-05 DIAGNOSIS — M9904 Segmental and somatic dysfunction of sacral region: Secondary | ICD-10-CM | POA: Diagnosis not present

## 2016-09-05 DIAGNOSIS — M5136 Other intervertebral disc degeneration, lumbar region: Secondary | ICD-10-CM | POA: Diagnosis not present

## 2016-09-05 DIAGNOSIS — M9903 Segmental and somatic dysfunction of lumbar region: Secondary | ICD-10-CM | POA: Diagnosis not present

## 2016-09-06 DIAGNOSIS — R41841 Cognitive communication deficit: Secondary | ICD-10-CM | POA: Diagnosis not present

## 2016-09-09 DIAGNOSIS — M5137 Other intervertebral disc degeneration, lumbosacral region: Secondary | ICD-10-CM | POA: Diagnosis not present

## 2016-09-09 DIAGNOSIS — M5136 Other intervertebral disc degeneration, lumbar region: Secondary | ICD-10-CM | POA: Diagnosis not present

## 2016-09-09 DIAGNOSIS — M9903 Segmental and somatic dysfunction of lumbar region: Secondary | ICD-10-CM | POA: Diagnosis not present

## 2016-09-09 DIAGNOSIS — M9904 Segmental and somatic dysfunction of sacral region: Secondary | ICD-10-CM | POA: Diagnosis not present

## 2016-09-09 DIAGNOSIS — R41841 Cognitive communication deficit: Secondary | ICD-10-CM | POA: Diagnosis not present

## 2016-09-10 ENCOUNTER — Ambulatory Visit (INDEPENDENT_AMBULATORY_CARE_PROVIDER_SITE_OTHER): Payer: Medicare Other | Admitting: Licensed Clinical Social Worker

## 2016-09-10 DIAGNOSIS — F3341 Major depressive disorder, recurrent, in partial remission: Secondary | ICD-10-CM | POA: Diagnosis not present

## 2016-09-11 DIAGNOSIS — M5136 Other intervertebral disc degeneration, lumbar region: Secondary | ICD-10-CM | POA: Diagnosis not present

## 2016-09-11 DIAGNOSIS — M9903 Segmental and somatic dysfunction of lumbar region: Secondary | ICD-10-CM | POA: Diagnosis not present

## 2016-09-11 DIAGNOSIS — M5137 Other intervertebral disc degeneration, lumbosacral region: Secondary | ICD-10-CM | POA: Diagnosis not present

## 2016-09-11 DIAGNOSIS — M9904 Segmental and somatic dysfunction of sacral region: Secondary | ICD-10-CM | POA: Diagnosis not present

## 2016-09-12 DIAGNOSIS — M9903 Segmental and somatic dysfunction of lumbar region: Secondary | ICD-10-CM | POA: Diagnosis not present

## 2016-09-12 DIAGNOSIS — M9904 Segmental and somatic dysfunction of sacral region: Secondary | ICD-10-CM | POA: Diagnosis not present

## 2016-09-12 DIAGNOSIS — M5137 Other intervertebral disc degeneration, lumbosacral region: Secondary | ICD-10-CM | POA: Diagnosis not present

## 2016-09-12 DIAGNOSIS — M5136 Other intervertebral disc degeneration, lumbar region: Secondary | ICD-10-CM | POA: Diagnosis not present

## 2016-09-13 DIAGNOSIS — R41841 Cognitive communication deficit: Secondary | ICD-10-CM | POA: Diagnosis not present

## 2016-09-15 DIAGNOSIS — R41841 Cognitive communication deficit: Secondary | ICD-10-CM | POA: Diagnosis not present

## 2016-09-16 DIAGNOSIS — M5136 Other intervertebral disc degeneration, lumbar region: Secondary | ICD-10-CM | POA: Diagnosis not present

## 2016-09-16 DIAGNOSIS — M9903 Segmental and somatic dysfunction of lumbar region: Secondary | ICD-10-CM | POA: Diagnosis not present

## 2016-09-16 DIAGNOSIS — R41841 Cognitive communication deficit: Secondary | ICD-10-CM | POA: Diagnosis not present

## 2016-09-16 DIAGNOSIS — M5137 Other intervertebral disc degeneration, lumbosacral region: Secondary | ICD-10-CM | POA: Diagnosis not present

## 2016-09-16 DIAGNOSIS — M9904 Segmental and somatic dysfunction of sacral region: Secondary | ICD-10-CM | POA: Diagnosis not present

## 2016-09-18 ENCOUNTER — Other Ambulatory Visit: Payer: Self-pay | Admitting: Interventional Cardiology

## 2016-09-18 DIAGNOSIS — M9903 Segmental and somatic dysfunction of lumbar region: Secondary | ICD-10-CM | POA: Diagnosis not present

## 2016-09-18 DIAGNOSIS — M9904 Segmental and somatic dysfunction of sacral region: Secondary | ICD-10-CM | POA: Diagnosis not present

## 2016-09-18 DIAGNOSIS — M5137 Other intervertebral disc degeneration, lumbosacral region: Secondary | ICD-10-CM | POA: Diagnosis not present

## 2016-09-18 DIAGNOSIS — M5136 Other intervertebral disc degeneration, lumbar region: Secondary | ICD-10-CM | POA: Diagnosis not present

## 2016-09-30 ENCOUNTER — Other Ambulatory Visit: Payer: Self-pay | Admitting: Neurology

## 2016-09-30 NOTE — Telephone Encounter (Signed)
Is it ok to refill these meds  She has a follow up scheduled for 8-13

## 2016-09-30 NOTE — Telephone Encounter (Signed)
Ok to refill 

## 2016-10-01 ENCOUNTER — Ambulatory Visit: Payer: Medicare Other | Admitting: Licensed Clinical Social Worker

## 2016-10-03 DIAGNOSIS — R7303 Prediabetes: Secondary | ICD-10-CM | POA: Diagnosis not present

## 2016-10-03 DIAGNOSIS — E785 Hyperlipidemia, unspecified: Secondary | ICD-10-CM | POA: Diagnosis not present

## 2016-10-03 DIAGNOSIS — D696 Thrombocytopenia, unspecified: Secondary | ICD-10-CM | POA: Diagnosis not present

## 2016-10-03 DIAGNOSIS — J069 Acute upper respiratory infection, unspecified: Secondary | ICD-10-CM | POA: Diagnosis not present

## 2016-10-03 DIAGNOSIS — I129 Hypertensive chronic kidney disease with stage 1 through stage 4 chronic kidney disease, or unspecified chronic kidney disease: Secondary | ICD-10-CM | POA: Diagnosis not present

## 2016-10-03 DIAGNOSIS — Z86718 Personal history of other venous thrombosis and embolism: Secondary | ICD-10-CM | POA: Diagnosis not present

## 2016-10-03 DIAGNOSIS — Z23 Encounter for immunization: Secondary | ICD-10-CM | POA: Diagnosis not present

## 2016-10-03 DIAGNOSIS — D638 Anemia in other chronic diseases classified elsewhere: Secondary | ICD-10-CM | POA: Diagnosis not present

## 2016-10-04 ENCOUNTER — Ambulatory Visit: Payer: Medicare Other | Admitting: Licensed Clinical Social Worker

## 2016-10-09 DIAGNOSIS — M9903 Segmental and somatic dysfunction of lumbar region: Secondary | ICD-10-CM | POA: Diagnosis not present

## 2016-10-09 DIAGNOSIS — M5137 Other intervertebral disc degeneration, lumbosacral region: Secondary | ICD-10-CM | POA: Diagnosis not present

## 2016-10-09 DIAGNOSIS — M5136 Other intervertebral disc degeneration, lumbar region: Secondary | ICD-10-CM | POA: Diagnosis not present

## 2016-10-09 DIAGNOSIS — M9904 Segmental and somatic dysfunction of sacral region: Secondary | ICD-10-CM | POA: Diagnosis not present

## 2016-10-10 DIAGNOSIS — M9903 Segmental and somatic dysfunction of lumbar region: Secondary | ICD-10-CM | POA: Diagnosis not present

## 2016-10-10 DIAGNOSIS — M5136 Other intervertebral disc degeneration, lumbar region: Secondary | ICD-10-CM | POA: Diagnosis not present

## 2016-10-10 DIAGNOSIS — M5137 Other intervertebral disc degeneration, lumbosacral region: Secondary | ICD-10-CM | POA: Diagnosis not present

## 2016-10-10 DIAGNOSIS — M9904 Segmental and somatic dysfunction of sacral region: Secondary | ICD-10-CM | POA: Diagnosis not present

## 2016-10-14 DIAGNOSIS — M9904 Segmental and somatic dysfunction of sacral region: Secondary | ICD-10-CM | POA: Diagnosis not present

## 2016-10-14 DIAGNOSIS — M5136 Other intervertebral disc degeneration, lumbar region: Secondary | ICD-10-CM | POA: Diagnosis not present

## 2016-10-14 DIAGNOSIS — M9903 Segmental and somatic dysfunction of lumbar region: Secondary | ICD-10-CM | POA: Diagnosis not present

## 2016-10-14 DIAGNOSIS — M5137 Other intervertebral disc degeneration, lumbosacral region: Secondary | ICD-10-CM | POA: Diagnosis not present

## 2016-10-16 DIAGNOSIS — M9904 Segmental and somatic dysfunction of sacral region: Secondary | ICD-10-CM | POA: Diagnosis not present

## 2016-10-16 DIAGNOSIS — M5137 Other intervertebral disc degeneration, lumbosacral region: Secondary | ICD-10-CM | POA: Diagnosis not present

## 2016-10-16 DIAGNOSIS — M9903 Segmental and somatic dysfunction of lumbar region: Secondary | ICD-10-CM | POA: Diagnosis not present

## 2016-10-16 DIAGNOSIS — M5136 Other intervertebral disc degeneration, lumbar region: Secondary | ICD-10-CM | POA: Diagnosis not present

## 2016-10-17 DIAGNOSIS — M5136 Other intervertebral disc degeneration, lumbar region: Secondary | ICD-10-CM | POA: Diagnosis not present

## 2016-10-17 DIAGNOSIS — M5137 Other intervertebral disc degeneration, lumbosacral region: Secondary | ICD-10-CM | POA: Diagnosis not present

## 2016-10-17 DIAGNOSIS — M9904 Segmental and somatic dysfunction of sacral region: Secondary | ICD-10-CM | POA: Diagnosis not present

## 2016-10-17 DIAGNOSIS — M9903 Segmental and somatic dysfunction of lumbar region: Secondary | ICD-10-CM | POA: Diagnosis not present

## 2016-10-21 DIAGNOSIS — M5136 Other intervertebral disc degeneration, lumbar region: Secondary | ICD-10-CM | POA: Diagnosis not present

## 2016-10-21 DIAGNOSIS — M5137 Other intervertebral disc degeneration, lumbosacral region: Secondary | ICD-10-CM | POA: Diagnosis not present

## 2016-10-21 DIAGNOSIS — M9903 Segmental and somatic dysfunction of lumbar region: Secondary | ICD-10-CM | POA: Diagnosis not present

## 2016-10-21 DIAGNOSIS — M9904 Segmental and somatic dysfunction of sacral region: Secondary | ICD-10-CM | POA: Diagnosis not present

## 2016-10-22 ENCOUNTER — Ambulatory Visit (INDEPENDENT_AMBULATORY_CARE_PROVIDER_SITE_OTHER): Payer: Medicare Other | Admitting: Licensed Clinical Social Worker

## 2016-10-22 DIAGNOSIS — F3341 Major depressive disorder, recurrent, in partial remission: Secondary | ICD-10-CM | POA: Diagnosis not present

## 2016-10-23 DIAGNOSIS — M9904 Segmental and somatic dysfunction of sacral region: Secondary | ICD-10-CM | POA: Diagnosis not present

## 2016-10-23 DIAGNOSIS — M5136 Other intervertebral disc degeneration, lumbar region: Secondary | ICD-10-CM | POA: Diagnosis not present

## 2016-10-23 DIAGNOSIS — M5137 Other intervertebral disc degeneration, lumbosacral region: Secondary | ICD-10-CM | POA: Diagnosis not present

## 2016-10-23 DIAGNOSIS — M9903 Segmental and somatic dysfunction of lumbar region: Secondary | ICD-10-CM | POA: Diagnosis not present

## 2016-10-24 DIAGNOSIS — M9904 Segmental and somatic dysfunction of sacral region: Secondary | ICD-10-CM | POA: Diagnosis not present

## 2016-10-24 DIAGNOSIS — M5137 Other intervertebral disc degeneration, lumbosacral region: Secondary | ICD-10-CM | POA: Diagnosis not present

## 2016-10-24 DIAGNOSIS — M9903 Segmental and somatic dysfunction of lumbar region: Secondary | ICD-10-CM | POA: Diagnosis not present

## 2016-10-24 DIAGNOSIS — M5136 Other intervertebral disc degeneration, lumbar region: Secondary | ICD-10-CM | POA: Diagnosis not present

## 2016-10-31 DIAGNOSIS — M5136 Other intervertebral disc degeneration, lumbar region: Secondary | ICD-10-CM | POA: Diagnosis not present

## 2016-10-31 DIAGNOSIS — M5137 Other intervertebral disc degeneration, lumbosacral region: Secondary | ICD-10-CM | POA: Diagnosis not present

## 2016-10-31 DIAGNOSIS — M9903 Segmental and somatic dysfunction of lumbar region: Secondary | ICD-10-CM | POA: Diagnosis not present

## 2016-10-31 DIAGNOSIS — M9904 Segmental and somatic dysfunction of sacral region: Secondary | ICD-10-CM | POA: Diagnosis not present

## 2016-11-01 DIAGNOSIS — J309 Allergic rhinitis, unspecified: Secondary | ICD-10-CM | POA: Diagnosis not present

## 2016-11-01 DIAGNOSIS — I129 Hypertensive chronic kidney disease with stage 1 through stage 4 chronic kidney disease, or unspecified chronic kidney disease: Secondary | ICD-10-CM | POA: Diagnosis not present

## 2016-11-01 DIAGNOSIS — N183 Chronic kidney disease, stage 3 (moderate): Secondary | ICD-10-CM | POA: Diagnosis not present

## 2016-11-01 DIAGNOSIS — E039 Hypothyroidism, unspecified: Secondary | ICD-10-CM | POA: Diagnosis not present

## 2016-11-01 DIAGNOSIS — I480 Paroxysmal atrial fibrillation: Secondary | ICD-10-CM | POA: Diagnosis not present

## 2016-11-01 DIAGNOSIS — R42 Dizziness and giddiness: Secondary | ICD-10-CM | POA: Diagnosis not present

## 2016-11-04 ENCOUNTER — Telehealth: Payer: Self-pay | Admitting: Neurology

## 2016-11-04 ENCOUNTER — Ambulatory Visit: Payer: Self-pay | Admitting: Neurology

## 2016-11-04 NOTE — Telephone Encounter (Signed)
Spoke w/Chelsea, she rqstd verbal order for P/T for Pt. advsd there is not any mention of P/T, will need her to fax rqst.Verified she had correct fax#

## 2016-11-04 NOTE — Telephone Encounter (Signed)
Please call Chelsea at [facility] concerning PT orders. [phone]

## 2016-11-05 ENCOUNTER — Ambulatory Visit: Payer: Medicare Other | Admitting: Podiatry

## 2016-11-05 NOTE — Telephone Encounter (Signed)
This encounter was created in error - please disregard.

## 2016-11-06 DIAGNOSIS — M25562 Pain in left knee: Secondary | ICD-10-CM | POA: Diagnosis not present

## 2016-11-06 DIAGNOSIS — R278 Other lack of coordination: Secondary | ICD-10-CM | POA: Diagnosis not present

## 2016-11-06 DIAGNOSIS — R42 Dizziness and giddiness: Secondary | ICD-10-CM | POA: Diagnosis not present

## 2016-11-07 DIAGNOSIS — M5137 Other intervertebral disc degeneration, lumbosacral region: Secondary | ICD-10-CM | POA: Diagnosis not present

## 2016-11-07 DIAGNOSIS — M5136 Other intervertebral disc degeneration, lumbar region: Secondary | ICD-10-CM | POA: Diagnosis not present

## 2016-11-07 DIAGNOSIS — M9903 Segmental and somatic dysfunction of lumbar region: Secondary | ICD-10-CM | POA: Diagnosis not present

## 2016-11-07 DIAGNOSIS — R278 Other lack of coordination: Secondary | ICD-10-CM | POA: Diagnosis not present

## 2016-11-07 DIAGNOSIS — R42 Dizziness and giddiness: Secondary | ICD-10-CM | POA: Diagnosis not present

## 2016-11-07 DIAGNOSIS — M25562 Pain in left knee: Secondary | ICD-10-CM | POA: Diagnosis not present

## 2016-11-07 DIAGNOSIS — M9904 Segmental and somatic dysfunction of sacral region: Secondary | ICD-10-CM | POA: Diagnosis not present

## 2016-11-08 DIAGNOSIS — R278 Other lack of coordination: Secondary | ICD-10-CM | POA: Diagnosis not present

## 2016-11-08 DIAGNOSIS — M25562 Pain in left knee: Secondary | ICD-10-CM | POA: Diagnosis not present

## 2016-11-08 DIAGNOSIS — R42 Dizziness and giddiness: Secondary | ICD-10-CM | POA: Diagnosis not present

## 2016-11-11 DIAGNOSIS — M9903 Segmental and somatic dysfunction of lumbar region: Secondary | ICD-10-CM | POA: Diagnosis not present

## 2016-11-11 DIAGNOSIS — R278 Other lack of coordination: Secondary | ICD-10-CM | POA: Diagnosis not present

## 2016-11-11 DIAGNOSIS — M9904 Segmental and somatic dysfunction of sacral region: Secondary | ICD-10-CM | POA: Diagnosis not present

## 2016-11-11 DIAGNOSIS — M5136 Other intervertebral disc degeneration, lumbar region: Secondary | ICD-10-CM | POA: Diagnosis not present

## 2016-11-11 DIAGNOSIS — M25562 Pain in left knee: Secondary | ICD-10-CM | POA: Diagnosis not present

## 2016-11-11 DIAGNOSIS — M5137 Other intervertebral disc degeneration, lumbosacral region: Secondary | ICD-10-CM | POA: Diagnosis not present

## 2016-11-11 DIAGNOSIS — R42 Dizziness and giddiness: Secondary | ICD-10-CM | POA: Diagnosis not present

## 2016-11-12 DIAGNOSIS — M25562 Pain in left knee: Secondary | ICD-10-CM | POA: Diagnosis not present

## 2016-11-12 DIAGNOSIS — R42 Dizziness and giddiness: Secondary | ICD-10-CM | POA: Diagnosis not present

## 2016-11-12 DIAGNOSIS — R278 Other lack of coordination: Secondary | ICD-10-CM | POA: Diagnosis not present

## 2016-11-13 DIAGNOSIS — R42 Dizziness and giddiness: Secondary | ICD-10-CM | POA: Diagnosis not present

## 2016-11-13 DIAGNOSIS — M25562 Pain in left knee: Secondary | ICD-10-CM | POA: Diagnosis not present

## 2016-11-13 DIAGNOSIS — R278 Other lack of coordination: Secondary | ICD-10-CM | POA: Diagnosis not present

## 2016-11-14 DIAGNOSIS — M9903 Segmental and somatic dysfunction of lumbar region: Secondary | ICD-10-CM | POA: Diagnosis not present

## 2016-11-14 DIAGNOSIS — M5137 Other intervertebral disc degeneration, lumbosacral region: Secondary | ICD-10-CM | POA: Diagnosis not present

## 2016-11-14 DIAGNOSIS — M5136 Other intervertebral disc degeneration, lumbar region: Secondary | ICD-10-CM | POA: Diagnosis not present

## 2016-11-14 DIAGNOSIS — M9904 Segmental and somatic dysfunction of sacral region: Secondary | ICD-10-CM | POA: Diagnosis not present

## 2016-11-15 DIAGNOSIS — R278 Other lack of coordination: Secondary | ICD-10-CM | POA: Diagnosis not present

## 2016-11-15 DIAGNOSIS — M25562 Pain in left knee: Secondary | ICD-10-CM | POA: Diagnosis not present

## 2016-11-15 DIAGNOSIS — R42 Dizziness and giddiness: Secondary | ICD-10-CM | POA: Diagnosis not present

## 2016-11-18 DIAGNOSIS — M5137 Other intervertebral disc degeneration, lumbosacral region: Secondary | ICD-10-CM | POA: Diagnosis not present

## 2016-11-18 DIAGNOSIS — M5136 Other intervertebral disc degeneration, lumbar region: Secondary | ICD-10-CM | POA: Diagnosis not present

## 2016-11-18 DIAGNOSIS — M9904 Segmental and somatic dysfunction of sacral region: Secondary | ICD-10-CM | POA: Diagnosis not present

## 2016-11-18 DIAGNOSIS — M9903 Segmental and somatic dysfunction of lumbar region: Secondary | ICD-10-CM | POA: Diagnosis not present

## 2016-11-19 ENCOUNTER — Ambulatory Visit (INDEPENDENT_AMBULATORY_CARE_PROVIDER_SITE_OTHER): Payer: Medicare Other | Admitting: Licensed Clinical Social Worker

## 2016-11-19 DIAGNOSIS — M25562 Pain in left knee: Secondary | ICD-10-CM | POA: Diagnosis not present

## 2016-11-19 DIAGNOSIS — F3341 Major depressive disorder, recurrent, in partial remission: Secondary | ICD-10-CM | POA: Diagnosis not present

## 2016-11-19 DIAGNOSIS — R42 Dizziness and giddiness: Secondary | ICD-10-CM | POA: Diagnosis not present

## 2016-11-19 DIAGNOSIS — R278 Other lack of coordination: Secondary | ICD-10-CM | POA: Diagnosis not present

## 2016-11-20 DIAGNOSIS — R42 Dizziness and giddiness: Secondary | ICD-10-CM | POA: Diagnosis not present

## 2016-11-20 DIAGNOSIS — M25562 Pain in left knee: Secondary | ICD-10-CM | POA: Diagnosis not present

## 2016-11-20 DIAGNOSIS — R278 Other lack of coordination: Secondary | ICD-10-CM | POA: Diagnosis not present

## 2016-11-21 DIAGNOSIS — M9904 Segmental and somatic dysfunction of sacral region: Secondary | ICD-10-CM | POA: Diagnosis not present

## 2016-11-21 DIAGNOSIS — M9903 Segmental and somatic dysfunction of lumbar region: Secondary | ICD-10-CM | POA: Diagnosis not present

## 2016-11-21 DIAGNOSIS — M5137 Other intervertebral disc degeneration, lumbosacral region: Secondary | ICD-10-CM | POA: Diagnosis not present

## 2016-11-21 DIAGNOSIS — M5136 Other intervertebral disc degeneration, lumbar region: Secondary | ICD-10-CM | POA: Diagnosis not present

## 2016-11-21 DIAGNOSIS — R278 Other lack of coordination: Secondary | ICD-10-CM | POA: Diagnosis not present

## 2016-11-21 DIAGNOSIS — R42 Dizziness and giddiness: Secondary | ICD-10-CM | POA: Diagnosis not present

## 2016-11-21 DIAGNOSIS — M25562 Pain in left knee: Secondary | ICD-10-CM | POA: Diagnosis not present

## 2016-11-22 ENCOUNTER — Ambulatory Visit (INDEPENDENT_AMBULATORY_CARE_PROVIDER_SITE_OTHER): Payer: Medicare Other | Admitting: Neurology

## 2016-11-22 ENCOUNTER — Encounter: Payer: Self-pay | Admitting: Neurology

## 2016-11-22 VITALS — BP 128/56 | HR 90 | Ht 65.0 in | Wt 224.4 lb

## 2016-11-22 DIAGNOSIS — B0222 Postherpetic trigeminal neuralgia: Secondary | ICD-10-CM

## 2016-11-22 DIAGNOSIS — G309 Alzheimer's disease, unspecified: Secondary | ICD-10-CM

## 2016-11-22 DIAGNOSIS — F0280 Dementia in other diseases classified elsewhere without behavioral disturbance: Secondary | ICD-10-CM | POA: Diagnosis not present

## 2016-11-22 DIAGNOSIS — H401132 Primary open-angle glaucoma, bilateral, moderate stage: Secondary | ICD-10-CM | POA: Diagnosis not present

## 2016-11-22 DIAGNOSIS — F0150 Vascular dementia without behavioral disturbance: Secondary | ICD-10-CM | POA: Diagnosis not present

## 2016-11-22 NOTE — Patient Instructions (Signed)
1.  Continue Carbatrol 200mg  twice daily and [name] 300mg  twice daily 2.  Continue Namzaric 3.  Follow up in one year

## 2016-11-22 NOTE — Progress Notes (Signed)
NEUROLOGY FOLLOW UP OFFICE NOTE  [name] [id number]  HISTORY OF PRESENT ILLNESS: [name] is an 83 year old right-handed woman with history of hypertension, CAD, atrial fibrillation, stroke with right-sided weakness, hypercholesterolemia, diabetes mellitus, DVT and pulmonary embolism (on Lovenox), migraine, herpes zoster, sleep apnea, gait disorder, memory problems and anxiety who follows up for mixed Alzheimer's and vascular dementia and right post-herpetic trigeminal neuralgia.  She is accompanied by her daughter who provides some history.     I  ATYPICAL RIGHT FACIAL PAIN: History: Onset of symptoms started in 2006, following episode of herpes zoster involving she describes sharp pain involving the the right V1 and V2 distribution, associated with allodynia. Chewing, talking, and brushing her teeth triggers and exacerbates the pain.  She was subsequently treated for postherpetic neuralgia.     Past medications:   carbamazepine generic which led to breakthrough pain.     Update: She was able to taper Carbatrol 200mg  from three times daily to twice daily and [name] 300mg  from three times daily to twice daily.  Pain is controlled.   II.  DEMENTIA: History: This started about a year ago after initiation of gabapentin. It was fairly sudden onset and has not progressed.  She tends to repeat herself often, such as telling stories. She repeats questions often. She reports word-finding difficulties as well.  She does have difficulty using the phone and managing her finances. She typically lives by herself in her one story house. She manages her own finances. She doesn't drive 2 to remote history of blackout spells. She typically does not have problems recognizing people or recalling names. She denies any depression. She denies any hallucinations. She does have history of vivid nightmares.  She reports a maternal aunt with history of dementia. To evaluate cognitive problems, an MRI of  the brain was performed on 12/01/13, which revealed extensive small vessel ischemic changes throughout the deep and subcortical white matter, including remote small infarcts involving the cerebellum and left basal ganglia.  Chronic micro-hemorrhages also noted, most notably in the right frontal lobe.  No acute findings noted.   For several years, she has episodes of recurrent speech hesitancy.  She knows what she wants to say but has trouble getting the words out.  In the past, it has occurred in setting of UTI.   To evaluate episodes of transient speech dysfunction, EEG was performed on 03/10/14, which was normal.  She went to speech therapy without any real improvement.  Therapist thinks it is likely medication-related.   She underwent neuropsychological testing on 01/12/15.  Testing was consistent with mild dementia.  She exhibited cognitive dysfunction involving the medial-temporal lobe, which is suggestive of Alzheimer's disease.  However, family endorses abrupt onset, which correlates with vascular etiology.  Repeat MRI of the brain performed 02/22/15, stable compared to prior imaging from 12/01/13.  She had repeat neurocognitive testing on 01/02/16.  Cognitive deficits/mild dementia stable compared to one year ago.   Update: She takes Namzaric.  Word-finding difficulty fluctuates.  PAST MEDICAL HISTORY: Past Medical History:  Diagnosis Date  . A-fib ([place])   . Acid reflux   . Anxiety   . Arthritis   . Chronic anticoagulation 03/07/2013   Lovenox 100 QD  . Chronic kidney disease    Stage III  . CVA (cerebral infarction) 1964  . Dementia    Dr Brett Fairy  . Diverticulosis   . DVT (deep venous thrombosis) ([place]) 2007   recurrent w PE s/p [place] filter  .  GERD (gastroesophageal reflux disease)    w hiatal hernia endoscopy 2003, Dr Penelope Coop  . Glaucoma   . Hypercholesterolemia   . Hypertension   . Hypoglycemia   . Hypothyroidism    goiter  . IBS (irritable bowel syndrome)   .  Junctional bradycardia    resolved with discontinuation of sotalol (2014) after 10 yrs  . Memory loss   . Migraine   . Osteoarthritis    in Bilateral knees and back. -Dr Latanya Maudlin  . Osteoporosis   . Panic attacks   . Phlebitis   . Postherpetic trigeminal neuralgia 10/20/2012  . Pulmonary embolism recurrent    IVC filter  . Shingles    zoster 1990 on the back and on the abdomen in 2007,facial zoster 2006  . Trigeminal neuralgia    r face--Dr Dohmeier  . Unspecified cerebral artery occlusion with cerebral infarction   . Vitamin D deficiency     MEDICATIONS: Current Outpatient Prescriptions on File Prior to Visit  Medication Sig Dispense Refill  . amLODipine (NORVASC) 2.5 MG tablet TAKE 1 TABLET BY MOUTH EVERY DAY 30 tablet 2  . CARBATROL 100 MG 12 hr capsule TAKE 2 CAPSULES BY MOUTH 3 TIMES DAILY 360 capsule 2  . cholecalciferol (VITAMIN D) 1000 units tablet Take 1,000 Units by mouth daily.    Marland Kitchen enoxaparin (LOVENOX) 100 MG/ML injection Inject 100 mg into the skin at bedtime.     Marland Kitchen EPINEPHrine (EPIPEN IJ) Inject as directed once as needed. For allergic reactions    . Fe Fum-FA-B Cmp-C-Zn-Mg-Mn-Cu (HEMOCYTE PLUS) 106-1 MG CAPS Take 1 capsule by mouth daily.     . fluticasone (FLONASE) 50 MCG/ACT nasal spray Place 1 spray into left nostril daily as needed for allergies or rhinitis.   12  . folic acid (FOLVITE) 1 MG tablet Take 1 mg by mouth every morning.     Marland Kitchen [name] 300 MG TABS Take 300 mg by mouth 3 (three) times daily.    Marland Kitchen [name] 300 MG TABS TAKE 1 TABLET BY MOUTH EVERY MORNING, 1 TABLET AT NOON, AND AT BEDTIME 270 tablet 1  . HYDROcodone-acetaminophen (NORCO/VICODIN) 5-325 MG per tablet Take 1 tablet by mouth every 6 (six) hours as needed for moderate pain.    Marland Kitchen levothyroxine (SYNTHROID, LEVOTHROID) 75 MCG tablet Take 75 mcg by mouth daily before breakfast.     . loratadine (CLARITIN) 10 MG tablet Take 10 mg by mouth daily.    . meclizine (ANTIVERT) 25 MG tablet Take 25 mg by  mouth 2 (two) times daily as needed for dizziness or nausea.   1  . Multiple Vitamin (MULTIVITAMIN) tablet Take 1 tablet by mouth every morning.     Marland Kitchen NAMZARIC 28-10 MG CP24 TAKE 1 CAPSULE BY MOUTH EVERY DAY 30 capsule 2  . pravastatin (PRAVACHOL) 40 MG tablet Take 40 mg by mouth every morning.     . RABEprazole (ACIPHEX) 20 MG tablet Take 20 mg by mouth every morning.     . sodium chloride 1 G tablet Take 1 tablet (1 g total) by mouth 2 (two) times daily with a meal. 60 tablet 0  . Travoprost, BAK Free, (TRAVATAN) 0.004 % SOLN ophthalmic solution Place 1 drop into both eyes at bedtime.     No current facility-administered medications on file prior to visit.     ALLERGIES: Allergies  Allergen Reactions  . Benadryl [Diphenhydramine Hcl] Hives and Shortness Of Breath  . Eye Drops [Tetrahydrozoline Hcl] Other (See Comments)    Burning,  itching, swelling  . Flu Virus Vaccine Shortness Of Breath  . Iohexol Hives and Shortness Of Breath     Code: HIVES, Desc: PT IS ALLERGIC TO IVP DYE/IODINE/SHRIMP.  CAUSES SOB AND HIVES PER PT.  STEPHANIE, RT-R, Onset Date: [id number]   . Shrimp [Shellfish Allergy] Anaphylaxis  . Voltaren [Diclofenac Sodium] Other (See Comments)    Acid reflux symptoms  . Ambien [Zolpidem Tartrate] Hives  . Gluten Meal Diarrhea  . Travatan Z [Travoprost] Other (See Comments)    BRAND NAME ONLY OLCANNOT TOLERATE GENERIC FORM OF EYE DROPS, IT CONTAINS PRESERVATIVES-SENSITIVITY  . Ativan [Lorazepam]     delusional  . Hydrocodone     Only on the higher dosages  . Betadine Antibiotic-Moisturize [Bacitracin-Polymyxin B] Rash  . Biaxin [Clarithromycin] Hives  . Celebrex [Celecoxib] Rash  . Ciprofloxacin Hcl Rash       . Combigan [Brimonidine Tartrate-Timolol] Other (See Comments)    unknown  . Coumadin [Warfarin Sodium] Rash  . Ivp Dye [Iodinated Diagnostic Agents] Hives  . Levaquin [Levofloxacin In D5w] Rash  . Naprosyn [Naproxen] Rash  . Pataday [Olopatadine Hcl]  Other (See Comments)  . Penicillins Hives    Has patient had a PCN reaction causing immediate rash, facial/tongue/throat swelling, SOB or lightheadedness with hypotension: Yes Has patient had a PCN reaction causing severe rash involving mucus membranes or skin necrosis: No Has patient had a PCN reaction that required hospitalization No Has patient had a PCN reaction occurring within the last 10 years: No If all of the above answers are "NO", then may proceed with Cephalosporin use.   Marland Kitchen Plavix [Clopidogrel Bisulfate] Rash  . Septra [Sulfamethoxazole-Trimethoprim] Hives    FAMILY HISTORY: Family History  Problem Relation Age of Onset  . Diabetes Mother   . Hypertension Mother   . Glaucoma Mother   . CVA Mother   . CAD Mother   . Hypertension Father   . Heart attack Father   . Kidney failure Father   . Heart attack Brother        2 half brothers  . Hypertension Brother   . CVA Brother   . Glaucoma Other   . Glaucoma Other   . Prostate cancer Maternal Grandfather   . Breast cancer Maternal Aunt   . Colon cancer Maternal Uncle   . Rectal cancer Maternal Uncle     SOCIAL HISTORY: Social History   Social History  . Marital status: Divorced    Spouse name: N/A  . Number of children: 3  . Years of education: 14   Occupational History  . retired Retired    Armed forces training and education officer   Social History Main Topics  . Smoking status: Never Smoker  . Smokeless tobacco: Never Used  . Alcohol use No     Comment: very moderate, none  in the last two months  . Drug use: No  . Sexual activity: No   Other Topics Concern  . Not on file   Social History Narrative   Patient is retired and lives at home alone. Patient has a college education.    Patient has three children.   Patient is right-handed.   Patient drinks 1-2 cups of caffeine daily.    REVIEW OF SYSTEMS: Constitutional: No fevers, chills, or sweats, no generalized fatigue, change in appetite Eyes: No visual  changes, double vision, eye pain Ear, nose and throat: No hearing loss, ear pain, nasal congestion, sore throat Cardiovascular: No chest pain, palpitations Respiratory:  No shortness of breath at  rest or with exertion, wheezes GastrointestinaI: No nausea, vomiting, diarrhea, abdominal pain, fecal incontinence Genitourinary:  No dysuria, urinary retention or frequency Musculoskeletal:  No neck pain, back pain Integumentary: No rash, pruritus, skin lesions Neurological: as above Psychiatric: No depression, insomnia, anxiety Endocrine: No palpitations, fatigue, diaphoresis, mood swings, change in appetite, change in weight, increased thirst Hematologic/Lymphatic:  No purpura, petechiae. Allergic/Immunologic: no itchy/runny eyes, nasal congestion, recent allergic reactions, rashes  PHYSICAL EXAM: Vitals:   11/22/16 1430  BP: (!) 128/56  Pulse: 90  SpO2: 94%   General: No acute distress.  Patient appears well-groomed.   Head:  Normocephalic/atraumatic Eyes:  Fundi examined but not visualized Neck: supple, no paraspinal tenderness, full range of motion Heart:  Regular rate and rhythm Lungs:  Clear to auscultation bilaterally Back: No paraspinal tenderness Neurological Exam: alert and oriented to person, place, and time. Attention span and concentration intact, delayed recall 1/3 words, remote memory intact, fund of knowledge intact.  Speech fluent and not dysarthric, language intact.   MMSE - Mini Mental State Exam 11/22/2016  Orientation to time 4  Orientation to Place 4  Registration 3  Attention/ Calculation 5  Recall 1  Language- name 2 objects 2  Language- repeat 1  Language- follow 3 step command 3  Language- read & follow direction 1  Write a sentence 1  Copy design 1  Total score 26   CN II-XII intact. Bulk and tone normal, muscle strength 5/5 throughout.  Sensation to light touch, temperature and vibration intact.  Deep tendon reflexes 2+ throughout, toes downgoing.   Finger to nose testing intact.  Gait unsteady.  IMPRESSION: Mixed Alzheimer's and vascular dementia Post-herpetic trigeminal neuralgia  PLAN: Carbatrol 200mg  twice daily [name] 300mg  twice daily Obtain recent labs from Dr. Orest Dikes office Follow up in one year  17 minutes spent face to face with patient, over 50% spent discussing management.  Metta Clines, DO  CC:  [name], MD

## 2016-12-01 DIAGNOSIS — I480 Paroxysmal atrial fibrillation: Secondary | ICD-10-CM | POA: Diagnosis not present

## 2016-12-01 DIAGNOSIS — G500 Trigeminal neuralgia: Secondary | ICD-10-CM | POA: Diagnosis not present

## 2016-12-01 DIAGNOSIS — D638 Anemia in other chronic diseases classified elsewhere: Secondary | ICD-10-CM | POA: Diagnosis not present

## 2016-12-01 DIAGNOSIS — Z743 Need for continuous supervision: Secondary | ICD-10-CM | POA: Diagnosis not present

## 2016-12-01 DIAGNOSIS — G4733 Obstructive sleep apnea (adult) (pediatric): Secondary | ICD-10-CM | POA: Diagnosis not present

## 2016-12-01 DIAGNOSIS — E785 Hyperlipidemia, unspecified: Secondary | ICD-10-CM | POA: Diagnosis not present

## 2016-12-01 DIAGNOSIS — N3000 Acute cystitis without hematuria: Secondary | ICD-10-CM | POA: Diagnosis not present

## 2016-12-01 DIAGNOSIS — K219 Gastro-esophageal reflux disease without esophagitis: Secondary | ICD-10-CM | POA: Diagnosis not present

## 2016-12-01 DIAGNOSIS — F419 Anxiety disorder, unspecified: Secondary | ICD-10-CM | POA: Diagnosis not present

## 2016-12-01 DIAGNOSIS — M545 Low back pain: Secondary | ICD-10-CM | POA: Diagnosis not present

## 2016-12-01 DIAGNOSIS — E039 Hypothyroidism, unspecified: Secondary | ICD-10-CM | POA: Diagnosis not present

## 2016-12-01 DIAGNOSIS — G9349 Other encephalopathy: Secondary | ICD-10-CM | POA: Diagnosis not present

## 2016-12-01 DIAGNOSIS — F329 Major depressive disorder, single episode, unspecified: Secondary | ICD-10-CM | POA: Diagnosis not present

## 2016-12-01 DIAGNOSIS — S32028A Other fracture of second lumbar vertebra, initial encounter for closed fracture: Secondary | ICD-10-CM | POA: Diagnosis not present

## 2016-12-02 ENCOUNTER — Ambulatory Visit: Payer: Medicare Other | Admitting: Podiatry

## 2016-12-02 DIAGNOSIS — F0390 Unspecified dementia without behavioral disturbance: Secondary | ICD-10-CM | POA: Diagnosis present

## 2016-12-02 DIAGNOSIS — Z86718 Personal history of other venous thrombosis and embolism: Secondary | ICD-10-CM | POA: Diagnosis not present

## 2016-12-02 DIAGNOSIS — M545 Low back pain: Secondary | ICD-10-CM | POA: Diagnosis not present

## 2016-12-02 DIAGNOSIS — G9349 Other encephalopathy: Secondary | ICD-10-CM | POA: Diagnosis not present

## 2016-12-02 DIAGNOSIS — N183 Chronic kidney disease, stage 3 (moderate): Secondary | ICD-10-CM | POA: Diagnosis present

## 2016-12-02 DIAGNOSIS — Z8673 Personal history of transient ischemic attack (TIA), and cerebral infarction without residual deficits: Secondary | ICD-10-CM | POA: Diagnosis not present

## 2016-12-02 DIAGNOSIS — D638 Anemia in other chronic diseases classified elsewhere: Secondary | ICD-10-CM | POA: Diagnosis not present

## 2016-12-02 DIAGNOSIS — N3000 Acute cystitis without hematuria: Secondary | ICD-10-CM | POA: Diagnosis not present

## 2016-12-02 DIAGNOSIS — M1611 Unilateral primary osteoarthritis, right hip: Secondary | ICD-10-CM | POA: Diagnosis not present

## 2016-12-02 DIAGNOSIS — F419 Anxiety disorder, unspecified: Secondary | ICD-10-CM | POA: Diagnosis not present

## 2016-12-02 DIAGNOSIS — K219 Gastro-esophageal reflux disease without esophagitis: Secondary | ICD-10-CM | POA: Diagnosis not present

## 2016-12-02 DIAGNOSIS — G4733 Obstructive sleep apnea (adult) (pediatric): Secondary | ICD-10-CM | POA: Diagnosis not present

## 2016-12-02 DIAGNOSIS — R410 Disorientation, unspecified: Secondary | ICD-10-CM | POA: Diagnosis not present

## 2016-12-02 DIAGNOSIS — H409 Unspecified glaucoma: Secondary | ICD-10-CM | POA: Diagnosis present

## 2016-12-02 DIAGNOSIS — B9620 Unspecified Escherichia coli [E. coli] as the cause of diseases classified elsewhere: Secondary | ICD-10-CM | POA: Diagnosis not present

## 2016-12-02 DIAGNOSIS — F329 Major depressive disorder, single episode, unspecified: Secondary | ICD-10-CM | POA: Diagnosis not present

## 2016-12-02 DIAGNOSIS — K5900 Constipation, unspecified: Secondary | ICD-10-CM | POA: Diagnosis present

## 2016-12-02 DIAGNOSIS — S39012A Strain of muscle, fascia and tendon of lower back, initial encounter: Secondary | ICD-10-CM | POA: Diagnosis present

## 2016-12-02 DIAGNOSIS — I129 Hypertensive chronic kidney disease with stage 1 through stage 4 chronic kidney disease, or unspecified chronic kidney disease: Secondary | ICD-10-CM | POA: Diagnosis present

## 2016-12-02 DIAGNOSIS — R4182 Altered mental status, unspecified: Secondary | ICD-10-CM | POA: Diagnosis not present

## 2016-12-02 DIAGNOSIS — G500 Trigeminal neuralgia: Secondary | ICD-10-CM | POA: Diagnosis not present

## 2016-12-02 DIAGNOSIS — M479 Spondylosis, unspecified: Secondary | ICD-10-CM | POA: Diagnosis present

## 2016-12-02 DIAGNOSIS — I480 Paroxysmal atrial fibrillation: Secondary | ICD-10-CM | POA: Diagnosis not present

## 2016-12-02 DIAGNOSIS — E039 Hypothyroidism, unspecified: Secondary | ICD-10-CM | POA: Diagnosis not present

## 2016-12-02 DIAGNOSIS — S32028A Other fracture of second lumbar vertebra, initial encounter for closed fracture: Secondary | ICD-10-CM | POA: Diagnosis not present

## 2016-12-02 DIAGNOSIS — E785 Hyperlipidemia, unspecified: Secondary | ICD-10-CM | POA: Diagnosis not present

## 2016-12-02 DIAGNOSIS — I700 Atherosclerosis of aorta: Secondary | ICD-10-CM | POA: Diagnosis present

## 2016-12-02 DIAGNOSIS — Z86711 Personal history of pulmonary embolism: Secondary | ICD-10-CM | POA: Diagnosis not present

## 2016-12-06 DIAGNOSIS — N3000 Acute cystitis without hematuria: Secondary | ICD-10-CM | POA: Diagnosis not present

## 2016-12-06 DIAGNOSIS — I129 Hypertensive chronic kidney disease with stage 1 through stage 4 chronic kidney disease, or unspecified chronic kidney disease: Secondary | ICD-10-CM | POA: Diagnosis present

## 2016-12-06 DIAGNOSIS — S32009A Unspecified fracture of unspecified lumbar vertebra, initial encounter for closed fracture: Secondary | ICD-10-CM | POA: Diagnosis present

## 2016-12-06 DIAGNOSIS — E039 Hypothyroidism, unspecified: Secondary | ICD-10-CM | POA: Diagnosis not present

## 2016-12-06 DIAGNOSIS — B9620 Unspecified Escherichia coli [E. coli] as the cause of diseases classified elsewhere: Secondary | ICD-10-CM | POA: Diagnosis present

## 2016-12-06 DIAGNOSIS — N189 Chronic kidney disease, unspecified: Secondary | ICD-10-CM | POA: Diagnosis present

## 2016-12-06 DIAGNOSIS — R410 Disorientation, unspecified: Secondary | ICD-10-CM | POA: Diagnosis not present

## 2016-12-06 DIAGNOSIS — D638 Anemia in other chronic diseases classified elsewhere: Secondary | ICD-10-CM | POA: Diagnosis not present

## 2016-12-06 DIAGNOSIS — S32020D Wedge compression fracture of second lumbar vertebra, subsequent encounter for fracture with routine healing: Secondary | ICD-10-CM | POA: Diagnosis not present

## 2016-12-06 DIAGNOSIS — I480 Paroxysmal atrial fibrillation: Secondary | ICD-10-CM | POA: Diagnosis not present

## 2016-12-06 DIAGNOSIS — G4733 Obstructive sleep apnea (adult) (pediatric): Secondary | ICD-10-CM | POA: Diagnosis not present

## 2016-12-06 DIAGNOSIS — N390 Urinary tract infection, site not specified: Secondary | ICD-10-CM | POA: Diagnosis present

## 2016-12-06 DIAGNOSIS — Z86711 Personal history of pulmonary embolism: Secondary | ICD-10-CM | POA: Diagnosis not present

## 2016-12-06 DIAGNOSIS — K219 Gastro-esophageal reflux disease without esophagitis: Secondary | ICD-10-CM | POA: Diagnosis not present

## 2016-12-06 DIAGNOSIS — S32028A Other fracture of second lumbar vertebra, initial encounter for closed fracture: Secondary | ICD-10-CM | POA: Diagnosis not present

## 2016-12-06 DIAGNOSIS — M545 Low back pain: Secondary | ICD-10-CM | POA: Diagnosis not present

## 2016-12-06 DIAGNOSIS — F0280 Dementia in other diseases classified elsewhere without behavioral disturbance: Secondary | ICD-10-CM | POA: Diagnosis present

## 2016-12-06 DIAGNOSIS — G9349 Other encephalopathy: Secondary | ICD-10-CM | POA: Diagnosis not present

## 2016-12-06 DIAGNOSIS — F05 Delirium due to known physiological condition: Secondary | ICD-10-CM | POA: Diagnosis present

## 2016-12-06 DIAGNOSIS — E785 Hyperlipidemia, unspecified: Secondary | ICD-10-CM | POA: Diagnosis present

## 2016-12-06 DIAGNOSIS — G309 Alzheimer's disease, unspecified: Secondary | ICD-10-CM | POA: Diagnosis not present

## 2016-12-06 DIAGNOSIS — F329 Major depressive disorder, single episode, unspecified: Secondary | ICD-10-CM | POA: Diagnosis not present

## 2016-12-06 DIAGNOSIS — G500 Trigeminal neuralgia: Secondary | ICD-10-CM | POA: Diagnosis present

## 2016-12-06 DIAGNOSIS — F419 Anxiety disorder, unspecified: Secondary | ICD-10-CM | POA: Diagnosis not present

## 2016-12-06 DIAGNOSIS — I10 Essential (primary) hypertension: Secondary | ICD-10-CM | POA: Diagnosis not present

## 2016-12-06 DIAGNOSIS — F338 Other recurrent depressive disorders: Secondary | ICD-10-CM | POA: Diagnosis not present

## 2016-12-06 DIAGNOSIS — Z9181 History of falling: Secondary | ICD-10-CM | POA: Diagnosis not present

## 2016-12-08 DIAGNOSIS — F338 Other recurrent depressive disorders: Secondary | ICD-10-CM | POA: Diagnosis not present

## 2016-12-08 DIAGNOSIS — E039 Hypothyroidism, unspecified: Secondary | ICD-10-CM | POA: Diagnosis not present

## 2016-12-08 DIAGNOSIS — N189 Chronic kidney disease, unspecified: Secondary | ICD-10-CM | POA: Diagnosis not present

## 2016-12-08 DIAGNOSIS — Z9181 History of falling: Secondary | ICD-10-CM | POA: Diagnosis not present

## 2016-12-08 DIAGNOSIS — S32020D Wedge compression fracture of second lumbar vertebra, subsequent encounter for fracture with routine healing: Secondary | ICD-10-CM | POA: Diagnosis not present

## 2016-12-08 DIAGNOSIS — K219 Gastro-esophageal reflux disease without esophagitis: Secondary | ICD-10-CM | POA: Diagnosis not present

## 2016-12-08 DIAGNOSIS — M545 Low back pain: Secondary | ICD-10-CM | POA: Diagnosis not present

## 2016-12-08 DIAGNOSIS — F419 Anxiety disorder, unspecified: Secondary | ICD-10-CM | POA: Diagnosis not present

## 2016-12-08 DIAGNOSIS — E034 Atrophy of thyroid (acquired): Secondary | ICD-10-CM | POA: Diagnosis not present

## 2016-12-08 DIAGNOSIS — I480 Paroxysmal atrial fibrillation: Secondary | ICD-10-CM | POA: Diagnosis not present

## 2016-12-08 DIAGNOSIS — D638 Anemia in other chronic diseases classified elsewhere: Secondary | ICD-10-CM | POA: Diagnosis not present

## 2016-12-08 DIAGNOSIS — G309 Alzheimer's disease, unspecified: Secondary | ICD-10-CM | POA: Diagnosis not present

## 2016-12-08 DIAGNOSIS — E785 Hyperlipidemia, unspecified: Secondary | ICD-10-CM | POA: Diagnosis not present

## 2016-12-08 DIAGNOSIS — I10 Essential (primary) hypertension: Secondary | ICD-10-CM | POA: Diagnosis not present

## 2016-12-08 DIAGNOSIS — I639 Cerebral infarction, unspecified: Secondary | ICD-10-CM | POA: Diagnosis not present

## 2016-12-09 DIAGNOSIS — E034 Atrophy of thyroid (acquired): Secondary | ICD-10-CM | POA: Diagnosis not present

## 2016-12-09 DIAGNOSIS — I639 Cerebral infarction, unspecified: Secondary | ICD-10-CM | POA: Diagnosis not present

## 2016-12-09 DIAGNOSIS — I10 Essential (primary) hypertension: Secondary | ICD-10-CM | POA: Diagnosis not present

## 2016-12-09 DIAGNOSIS — N189 Chronic kidney disease, unspecified: Secondary | ICD-10-CM | POA: Diagnosis not present

## 2016-12-13 DIAGNOSIS — M25551 Pain in right hip: Secondary | ICD-10-CM | POA: Diagnosis not present

## 2016-12-13 DIAGNOSIS — M6281 Muscle weakness (generalized): Secondary | ICD-10-CM | POA: Diagnosis not present

## 2016-12-13 DIAGNOSIS — M25562 Pain in left knee: Secondary | ICD-10-CM | POA: Diagnosis not present

## 2016-12-13 DIAGNOSIS — M545 Low back pain: Secondary | ICD-10-CM | POA: Diagnosis not present

## 2016-12-13 DIAGNOSIS — R2681 Unsteadiness on feet: Secondary | ICD-10-CM | POA: Diagnosis not present

## 2016-12-13 DIAGNOSIS — R42 Dizziness and giddiness: Secondary | ICD-10-CM | POA: Diagnosis not present

## 2016-12-13 DIAGNOSIS — R278 Other lack of coordination: Secondary | ICD-10-CM | POA: Diagnosis not present

## 2016-12-16 DIAGNOSIS — M6281 Muscle weakness (generalized): Secondary | ICD-10-CM | POA: Diagnosis not present

## 2016-12-16 DIAGNOSIS — S32020D Wedge compression fracture of second lumbar vertebra, subsequent encounter for fracture with routine healing: Secondary | ICD-10-CM | POA: Diagnosis not present

## 2016-12-16 DIAGNOSIS — N390 Urinary tract infection, site not specified: Secondary | ICD-10-CM | POA: Diagnosis not present

## 2016-12-16 DIAGNOSIS — F0390 Unspecified dementia without behavioral disturbance: Secondary | ICD-10-CM | POA: Diagnosis not present

## 2016-12-16 DIAGNOSIS — M545 Low back pain: Secondary | ICD-10-CM | POA: Diagnosis not present

## 2016-12-16 DIAGNOSIS — M25562 Pain in left knee: Secondary | ICD-10-CM | POA: Diagnosis not present

## 2016-12-16 DIAGNOSIS — R278 Other lack of coordination: Secondary | ICD-10-CM | POA: Diagnosis not present

## 2016-12-16 DIAGNOSIS — R2681 Unsteadiness on feet: Secondary | ICD-10-CM | POA: Diagnosis not present

## 2016-12-16 DIAGNOSIS — M25551 Pain in right hip: Secondary | ICD-10-CM | POA: Diagnosis not present

## 2016-12-17 DIAGNOSIS — M6281 Muscle weakness (generalized): Secondary | ICD-10-CM | POA: Diagnosis not present

## 2016-12-17 DIAGNOSIS — R2681 Unsteadiness on feet: Secondary | ICD-10-CM | POA: Diagnosis not present

## 2016-12-17 DIAGNOSIS — M545 Low back pain: Secondary | ICD-10-CM | POA: Diagnosis not present

## 2016-12-17 DIAGNOSIS — R278 Other lack of coordination: Secondary | ICD-10-CM | POA: Diagnosis not present

## 2016-12-17 DIAGNOSIS — M25562 Pain in left knee: Secondary | ICD-10-CM | POA: Diagnosis not present

## 2016-12-17 DIAGNOSIS — M25551 Pain in right hip: Secondary | ICD-10-CM | POA: Diagnosis not present

## 2016-12-18 ENCOUNTER — Ambulatory Visit: Payer: Medicare Other | Admitting: Licensed Clinical Social Worker

## 2016-12-18 DIAGNOSIS — M6281 Muscle weakness (generalized): Secondary | ICD-10-CM | POA: Diagnosis not present

## 2016-12-18 DIAGNOSIS — M545 Low back pain: Secondary | ICD-10-CM | POA: Diagnosis not present

## 2016-12-18 DIAGNOSIS — M25551 Pain in right hip: Secondary | ICD-10-CM | POA: Diagnosis not present

## 2016-12-18 DIAGNOSIS — M25562 Pain in left knee: Secondary | ICD-10-CM | POA: Diagnosis not present

## 2016-12-18 DIAGNOSIS — R278 Other lack of coordination: Secondary | ICD-10-CM | POA: Diagnosis not present

## 2016-12-18 DIAGNOSIS — R2681 Unsteadiness on feet: Secondary | ICD-10-CM | POA: Diagnosis not present

## 2016-12-19 DIAGNOSIS — R278 Other lack of coordination: Secondary | ICD-10-CM | POA: Diagnosis not present

## 2016-12-19 DIAGNOSIS — R2681 Unsteadiness on feet: Secondary | ICD-10-CM | POA: Diagnosis not present

## 2016-12-19 DIAGNOSIS — M25562 Pain in left knee: Secondary | ICD-10-CM | POA: Diagnosis not present

## 2016-12-19 DIAGNOSIS — M545 Low back pain: Secondary | ICD-10-CM | POA: Diagnosis not present

## 2016-12-19 DIAGNOSIS — M6281 Muscle weakness (generalized): Secondary | ICD-10-CM | POA: Diagnosis not present

## 2016-12-19 DIAGNOSIS — M25551 Pain in right hip: Secondary | ICD-10-CM | POA: Diagnosis not present

## 2016-12-20 DIAGNOSIS — M25562 Pain in left knee: Secondary | ICD-10-CM | POA: Diagnosis not present

## 2016-12-20 DIAGNOSIS — M545 Low back pain: Secondary | ICD-10-CM | POA: Diagnosis not present

## 2016-12-20 DIAGNOSIS — M25551 Pain in right hip: Secondary | ICD-10-CM | POA: Diagnosis not present

## 2016-12-20 DIAGNOSIS — R2681 Unsteadiness on feet: Secondary | ICD-10-CM | POA: Diagnosis not present

## 2016-12-20 DIAGNOSIS — M6281 Muscle weakness (generalized): Secondary | ICD-10-CM | POA: Diagnosis not present

## 2016-12-20 DIAGNOSIS — R278 Other lack of coordination: Secondary | ICD-10-CM | POA: Diagnosis not present

## 2016-12-23 ENCOUNTER — Ambulatory Visit: Payer: Medicare Other | Admitting: Podiatry

## 2016-12-23 DIAGNOSIS — M25562 Pain in left knee: Secondary | ICD-10-CM | POA: Diagnosis not present

## 2016-12-23 DIAGNOSIS — R278 Other lack of coordination: Secondary | ICD-10-CM | POA: Diagnosis not present

## 2016-12-23 DIAGNOSIS — M25551 Pain in right hip: Secondary | ICD-10-CM | POA: Diagnosis not present

## 2016-12-23 DIAGNOSIS — R2681 Unsteadiness on feet: Secondary | ICD-10-CM | POA: Diagnosis not present

## 2016-12-23 DIAGNOSIS — R42 Dizziness and giddiness: Secondary | ICD-10-CM | POA: Diagnosis not present

## 2016-12-23 DIAGNOSIS — M545 Low back pain: Secondary | ICD-10-CM | POA: Diagnosis not present

## 2016-12-23 DIAGNOSIS — M6281 Muscle weakness (generalized): Secondary | ICD-10-CM | POA: Diagnosis not present

## 2016-12-24 DIAGNOSIS — R278 Other lack of coordination: Secondary | ICD-10-CM | POA: Diagnosis not present

## 2016-12-24 DIAGNOSIS — M545 Low back pain: Secondary | ICD-10-CM | POA: Diagnosis not present

## 2016-12-24 DIAGNOSIS — R2681 Unsteadiness on feet: Secondary | ICD-10-CM | POA: Diagnosis not present

## 2016-12-24 DIAGNOSIS — M25562 Pain in left knee: Secondary | ICD-10-CM | POA: Diagnosis not present

## 2016-12-24 DIAGNOSIS — M6281 Muscle weakness (generalized): Secondary | ICD-10-CM | POA: Diagnosis not present

## 2016-12-24 DIAGNOSIS — M25551 Pain in right hip: Secondary | ICD-10-CM | POA: Diagnosis not present

## 2016-12-25 ENCOUNTER — Ambulatory Visit: Payer: Self-pay | Admitting: Interventional Cardiology

## 2016-12-25 DIAGNOSIS — R2681 Unsteadiness on feet: Secondary | ICD-10-CM | POA: Diagnosis not present

## 2016-12-25 DIAGNOSIS — R278 Other lack of coordination: Secondary | ICD-10-CM | POA: Diagnosis not present

## 2016-12-25 DIAGNOSIS — M25551 Pain in right hip: Secondary | ICD-10-CM | POA: Diagnosis not present

## 2016-12-25 DIAGNOSIS — M545 Low back pain: Secondary | ICD-10-CM | POA: Diagnosis not present

## 2016-12-25 DIAGNOSIS — M25562 Pain in left knee: Secondary | ICD-10-CM | POA: Diagnosis not present

## 2016-12-25 DIAGNOSIS — M6281 Muscle weakness (generalized): Secondary | ICD-10-CM | POA: Diagnosis not present

## 2016-12-26 DIAGNOSIS — M6281 Muscle weakness (generalized): Secondary | ICD-10-CM | POA: Diagnosis not present

## 2016-12-26 DIAGNOSIS — R2681 Unsteadiness on feet: Secondary | ICD-10-CM | POA: Diagnosis not present

## 2016-12-26 DIAGNOSIS — M25562 Pain in left knee: Secondary | ICD-10-CM | POA: Diagnosis not present

## 2016-12-26 DIAGNOSIS — R278 Other lack of coordination: Secondary | ICD-10-CM | POA: Diagnosis not present

## 2016-12-26 DIAGNOSIS — M545 Low back pain: Secondary | ICD-10-CM | POA: Diagnosis not present

## 2016-12-26 DIAGNOSIS — M25551 Pain in right hip: Secondary | ICD-10-CM | POA: Diagnosis not present

## 2016-12-27 DIAGNOSIS — M545 Low back pain: Secondary | ICD-10-CM | POA: Diagnosis not present

## 2016-12-27 DIAGNOSIS — R2681 Unsteadiness on feet: Secondary | ICD-10-CM | POA: Diagnosis not present

## 2016-12-27 DIAGNOSIS — M25551 Pain in right hip: Secondary | ICD-10-CM | POA: Diagnosis not present

## 2016-12-27 DIAGNOSIS — M25562 Pain in left knee: Secondary | ICD-10-CM | POA: Diagnosis not present

## 2016-12-27 DIAGNOSIS — M6281 Muscle weakness (generalized): Secondary | ICD-10-CM | POA: Diagnosis not present

## 2016-12-27 DIAGNOSIS — R278 Other lack of coordination: Secondary | ICD-10-CM | POA: Diagnosis not present

## 2016-12-28 ENCOUNTER — Other Ambulatory Visit: Payer: Self-pay | Admitting: Neurology

## 2016-12-30 DIAGNOSIS — M6281 Muscle weakness (generalized): Secondary | ICD-10-CM | POA: Diagnosis not present

## 2016-12-30 DIAGNOSIS — R2681 Unsteadiness on feet: Secondary | ICD-10-CM | POA: Diagnosis not present

## 2016-12-30 DIAGNOSIS — R278 Other lack of coordination: Secondary | ICD-10-CM | POA: Diagnosis not present

## 2016-12-30 DIAGNOSIS — M25551 Pain in right hip: Secondary | ICD-10-CM | POA: Diagnosis not present

## 2016-12-30 DIAGNOSIS — M545 Low back pain: Secondary | ICD-10-CM | POA: Diagnosis not present

## 2016-12-30 DIAGNOSIS — M25562 Pain in left knee: Secondary | ICD-10-CM | POA: Diagnosis not present

## 2016-12-31 DIAGNOSIS — R278 Other lack of coordination: Secondary | ICD-10-CM | POA: Diagnosis not present

## 2016-12-31 DIAGNOSIS — M25551 Pain in right hip: Secondary | ICD-10-CM | POA: Diagnosis not present

## 2016-12-31 DIAGNOSIS — M545 Low back pain: Secondary | ICD-10-CM | POA: Diagnosis not present

## 2016-12-31 DIAGNOSIS — R2681 Unsteadiness on feet: Secondary | ICD-10-CM | POA: Diagnosis not present

## 2016-12-31 DIAGNOSIS — M6281 Muscle weakness (generalized): Secondary | ICD-10-CM | POA: Diagnosis not present

## 2016-12-31 DIAGNOSIS — M25562 Pain in left knee: Secondary | ICD-10-CM | POA: Diagnosis not present

## 2017-01-01 DIAGNOSIS — M545 Low back pain: Secondary | ICD-10-CM | POA: Diagnosis not present

## 2017-01-01 DIAGNOSIS — M25562 Pain in left knee: Secondary | ICD-10-CM | POA: Diagnosis not present

## 2017-01-01 DIAGNOSIS — R2681 Unsteadiness on feet: Secondary | ICD-10-CM | POA: Diagnosis not present

## 2017-01-01 DIAGNOSIS — R278 Other lack of coordination: Secondary | ICD-10-CM | POA: Diagnosis not present

## 2017-01-01 DIAGNOSIS — M6281 Muscle weakness (generalized): Secondary | ICD-10-CM | POA: Diagnosis not present

## 2017-01-01 DIAGNOSIS — M25551 Pain in right hip: Secondary | ICD-10-CM | POA: Diagnosis not present

## 2017-01-02 ENCOUNTER — Ambulatory Visit: Payer: Self-pay | Admitting: Interventional Cardiology

## 2017-01-02 DIAGNOSIS — M545 Low back pain: Secondary | ICD-10-CM | POA: Diagnosis not present

## 2017-01-02 DIAGNOSIS — R2681 Unsteadiness on feet: Secondary | ICD-10-CM | POA: Diagnosis not present

## 2017-01-02 DIAGNOSIS — M25551 Pain in right hip: Secondary | ICD-10-CM | POA: Diagnosis not present

## 2017-01-02 DIAGNOSIS — R278 Other lack of coordination: Secondary | ICD-10-CM | POA: Diagnosis not present

## 2017-01-02 DIAGNOSIS — M6281 Muscle weakness (generalized): Secondary | ICD-10-CM | POA: Diagnosis not present

## 2017-01-02 DIAGNOSIS — M25562 Pain in left knee: Secondary | ICD-10-CM | POA: Diagnosis not present

## 2017-01-03 DIAGNOSIS — M25562 Pain in left knee: Secondary | ICD-10-CM | POA: Diagnosis not present

## 2017-01-03 DIAGNOSIS — R2681 Unsteadiness on feet: Secondary | ICD-10-CM | POA: Diagnosis not present

## 2017-01-03 DIAGNOSIS — M25551 Pain in right hip: Secondary | ICD-10-CM | POA: Diagnosis not present

## 2017-01-03 DIAGNOSIS — M545 Low back pain: Secondary | ICD-10-CM | POA: Diagnosis not present

## 2017-01-03 DIAGNOSIS — R278 Other lack of coordination: Secondary | ICD-10-CM | POA: Diagnosis not present

## 2017-01-03 DIAGNOSIS — M6281 Muscle weakness (generalized): Secondary | ICD-10-CM | POA: Diagnosis not present

## 2017-01-07 DIAGNOSIS — R2681 Unsteadiness on feet: Secondary | ICD-10-CM | POA: Diagnosis not present

## 2017-01-07 DIAGNOSIS — M6281 Muscle weakness (generalized): Secondary | ICD-10-CM | POA: Diagnosis not present

## 2017-01-07 DIAGNOSIS — M25551 Pain in right hip: Secondary | ICD-10-CM | POA: Diagnosis not present

## 2017-01-07 DIAGNOSIS — M545 Low back pain: Secondary | ICD-10-CM | POA: Diagnosis not present

## 2017-01-07 DIAGNOSIS — R278 Other lack of coordination: Secondary | ICD-10-CM | POA: Diagnosis not present

## 2017-01-07 DIAGNOSIS — M25562 Pain in left knee: Secondary | ICD-10-CM | POA: Diagnosis not present

## 2017-01-09 DIAGNOSIS — R2681 Unsteadiness on feet: Secondary | ICD-10-CM | POA: Diagnosis not present

## 2017-01-09 DIAGNOSIS — R197 Diarrhea, unspecified: Secondary | ICD-10-CM | POA: Diagnosis not present

## 2017-01-09 DIAGNOSIS — M6281 Muscle weakness (generalized): Secondary | ICD-10-CM | POA: Diagnosis not present

## 2017-01-09 DIAGNOSIS — I129 Hypertensive chronic kidney disease with stage 1 through stage 4 chronic kidney disease, or unspecified chronic kidney disease: Secondary | ICD-10-CM | POA: Diagnosis not present

## 2017-01-09 DIAGNOSIS — N183 Chronic kidney disease, stage 3 (moderate): Secondary | ICD-10-CM | POA: Diagnosis not present

## 2017-01-09 DIAGNOSIS — F0390 Unspecified dementia without behavioral disturbance: Secondary | ICD-10-CM | POA: Diagnosis not present

## 2017-01-09 DIAGNOSIS — M8000XA Age-related osteoporosis with current pathological fracture, unspecified site, initial encounter for fracture: Secondary | ICD-10-CM | POA: Diagnosis not present

## 2017-01-09 DIAGNOSIS — M25562 Pain in left knee: Secondary | ICD-10-CM | POA: Diagnosis not present

## 2017-01-09 DIAGNOSIS — R278 Other lack of coordination: Secondary | ICD-10-CM | POA: Diagnosis not present

## 2017-01-09 DIAGNOSIS — S32020D Wedge compression fracture of second lumbar vertebra, subsequent encounter for fracture with routine healing: Secondary | ICD-10-CM | POA: Diagnosis not present

## 2017-01-09 DIAGNOSIS — Z6839 Body mass index (BMI) 39.0-39.9, adult: Secondary | ICD-10-CM | POA: Diagnosis not present

## 2017-01-09 DIAGNOSIS — K6289 Other specified diseases of anus and rectum: Secondary | ICD-10-CM | POA: Diagnosis not present

## 2017-01-09 DIAGNOSIS — M545 Low back pain: Secondary | ICD-10-CM | POA: Diagnosis not present

## 2017-01-09 DIAGNOSIS — M25551 Pain in right hip: Secondary | ICD-10-CM | POA: Diagnosis not present

## 2017-01-10 DIAGNOSIS — R2681 Unsteadiness on feet: Secondary | ICD-10-CM | POA: Diagnosis not present

## 2017-01-10 DIAGNOSIS — R278 Other lack of coordination: Secondary | ICD-10-CM | POA: Diagnosis not present

## 2017-01-10 DIAGNOSIS — M25562 Pain in left knee: Secondary | ICD-10-CM | POA: Diagnosis not present

## 2017-01-10 DIAGNOSIS — M25551 Pain in right hip: Secondary | ICD-10-CM | POA: Diagnosis not present

## 2017-01-10 DIAGNOSIS — M545 Low back pain: Secondary | ICD-10-CM | POA: Diagnosis not present

## 2017-01-10 DIAGNOSIS — M6281 Muscle weakness (generalized): Secondary | ICD-10-CM | POA: Diagnosis not present

## 2017-01-13 DIAGNOSIS — R2681 Unsteadiness on feet: Secondary | ICD-10-CM | POA: Diagnosis not present

## 2017-01-13 DIAGNOSIS — M25551 Pain in right hip: Secondary | ICD-10-CM | POA: Diagnosis not present

## 2017-01-13 DIAGNOSIS — M545 Low back pain: Secondary | ICD-10-CM | POA: Diagnosis not present

## 2017-01-13 DIAGNOSIS — M25562 Pain in left knee: Secondary | ICD-10-CM | POA: Diagnosis not present

## 2017-01-13 DIAGNOSIS — R278 Other lack of coordination: Secondary | ICD-10-CM | POA: Diagnosis not present

## 2017-01-13 DIAGNOSIS — M6281 Muscle weakness (generalized): Secondary | ICD-10-CM | POA: Diagnosis not present

## 2017-01-15 DIAGNOSIS — R278 Other lack of coordination: Secondary | ICD-10-CM | POA: Diagnosis not present

## 2017-01-15 DIAGNOSIS — M545 Low back pain: Secondary | ICD-10-CM | POA: Diagnosis not present

## 2017-01-15 DIAGNOSIS — M25551 Pain in right hip: Secondary | ICD-10-CM | POA: Diagnosis not present

## 2017-01-15 DIAGNOSIS — M25562 Pain in left knee: Secondary | ICD-10-CM | POA: Diagnosis not present

## 2017-01-15 DIAGNOSIS — M6281 Muscle weakness (generalized): Secondary | ICD-10-CM | POA: Diagnosis not present

## 2017-01-15 DIAGNOSIS — R2681 Unsteadiness on feet: Secondary | ICD-10-CM | POA: Diagnosis not present

## 2017-01-16 DIAGNOSIS — R278 Other lack of coordination: Secondary | ICD-10-CM | POA: Diagnosis not present

## 2017-01-16 DIAGNOSIS — M25551 Pain in right hip: Secondary | ICD-10-CM | POA: Diagnosis not present

## 2017-01-16 DIAGNOSIS — R2681 Unsteadiness on feet: Secondary | ICD-10-CM | POA: Diagnosis not present

## 2017-01-16 DIAGNOSIS — M25562 Pain in left knee: Secondary | ICD-10-CM | POA: Diagnosis not present

## 2017-01-16 DIAGNOSIS — M545 Low back pain: Secondary | ICD-10-CM | POA: Diagnosis not present

## 2017-01-16 DIAGNOSIS — M6281 Muscle weakness (generalized): Secondary | ICD-10-CM | POA: Diagnosis not present

## 2017-01-17 ENCOUNTER — Telehealth: Payer: Self-pay | Admitting: Neurology

## 2017-01-17 DIAGNOSIS — R278 Other lack of coordination: Secondary | ICD-10-CM | POA: Diagnosis not present

## 2017-01-17 DIAGNOSIS — M25551 Pain in right hip: Secondary | ICD-10-CM | POA: Diagnosis not present

## 2017-01-17 DIAGNOSIS — M545 Low back pain: Secondary | ICD-10-CM | POA: Diagnosis not present

## 2017-01-17 DIAGNOSIS — M6281 Muscle weakness (generalized): Secondary | ICD-10-CM | POA: Diagnosis not present

## 2017-01-17 DIAGNOSIS — M25562 Pain in left knee: Secondary | ICD-10-CM | POA: Diagnosis not present

## 2017-01-17 DIAGNOSIS — R2681 Unsteadiness on feet: Secondary | ICD-10-CM | POA: Diagnosis not present

## 2017-01-17 NOTE — Telephone Encounter (Signed)
Called and spoke with [name], She has since decided not to move appointment up, but will call back if changes her mind

## 2017-01-17 NOTE — Telephone Encounter (Signed)
Pt's daughter [name] called to let Dr Tomi Likens know pt had a fall and fractured her back, since then her dementia has gotten worse and she wanted to see if Dr Tomi Likens should see her soon, please call and advise CB# [phone]

## 2017-01-20 DIAGNOSIS — M25562 Pain in left knee: Secondary | ICD-10-CM | POA: Diagnosis not present

## 2017-01-20 DIAGNOSIS — M545 Low back pain: Secondary | ICD-10-CM | POA: Diagnosis not present

## 2017-01-20 DIAGNOSIS — R278 Other lack of coordination: Secondary | ICD-10-CM | POA: Diagnosis not present

## 2017-01-20 DIAGNOSIS — M6281 Muscle weakness (generalized): Secondary | ICD-10-CM | POA: Diagnosis not present

## 2017-01-20 DIAGNOSIS — R2681 Unsteadiness on feet: Secondary | ICD-10-CM | POA: Diagnosis not present

## 2017-01-20 DIAGNOSIS — M25551 Pain in right hip: Secondary | ICD-10-CM | POA: Diagnosis not present

## 2017-01-21 DIAGNOSIS — M25562 Pain in left knee: Secondary | ICD-10-CM | POA: Diagnosis not present

## 2017-01-21 DIAGNOSIS — M545 Low back pain: Secondary | ICD-10-CM | POA: Diagnosis not present

## 2017-01-21 DIAGNOSIS — M25561 Pain in right knee: Secondary | ICD-10-CM | POA: Diagnosis not present

## 2017-01-22 DIAGNOSIS — M25562 Pain in left knee: Secondary | ICD-10-CM | POA: Diagnosis not present

## 2017-01-22 DIAGNOSIS — M545 Low back pain: Secondary | ICD-10-CM | POA: Diagnosis not present

## 2017-01-22 DIAGNOSIS — R2681 Unsteadiness on feet: Secondary | ICD-10-CM | POA: Diagnosis not present

## 2017-01-22 DIAGNOSIS — M6281 Muscle weakness (generalized): Secondary | ICD-10-CM | POA: Diagnosis not present

## 2017-01-22 DIAGNOSIS — R278 Other lack of coordination: Secondary | ICD-10-CM | POA: Diagnosis not present

## 2017-01-22 DIAGNOSIS — M25551 Pain in right hip: Secondary | ICD-10-CM | POA: Diagnosis not present

## 2017-01-23 DIAGNOSIS — R42 Dizziness and giddiness: Secondary | ICD-10-CM | POA: Diagnosis not present

## 2017-01-23 DIAGNOSIS — M25551 Pain in right hip: Secondary | ICD-10-CM | POA: Diagnosis not present

## 2017-01-23 DIAGNOSIS — R278 Other lack of coordination: Secondary | ICD-10-CM | POA: Diagnosis not present

## 2017-01-23 DIAGNOSIS — M25562 Pain in left knee: Secondary | ICD-10-CM | POA: Diagnosis not present

## 2017-01-23 DIAGNOSIS — R2681 Unsteadiness on feet: Secondary | ICD-10-CM | POA: Diagnosis not present

## 2017-01-23 DIAGNOSIS — M545 Low back pain: Secondary | ICD-10-CM | POA: Diagnosis not present

## 2017-01-23 DIAGNOSIS — M6281 Muscle weakness (generalized): Secondary | ICD-10-CM | POA: Diagnosis not present

## 2017-01-24 DIAGNOSIS — M545 Low back pain: Secondary | ICD-10-CM | POA: Diagnosis not present

## 2017-01-24 DIAGNOSIS — M25562 Pain in left knee: Secondary | ICD-10-CM | POA: Diagnosis not present

## 2017-01-24 DIAGNOSIS — M6281 Muscle weakness (generalized): Secondary | ICD-10-CM | POA: Diagnosis not present

## 2017-01-24 DIAGNOSIS — R278 Other lack of coordination: Secondary | ICD-10-CM | POA: Diagnosis not present

## 2017-01-24 DIAGNOSIS — R2681 Unsteadiness on feet: Secondary | ICD-10-CM | POA: Diagnosis not present

## 2017-01-24 DIAGNOSIS — M25551 Pain in right hip: Secondary | ICD-10-CM | POA: Diagnosis not present

## 2017-01-27 DIAGNOSIS — M6281 Muscle weakness (generalized): Secondary | ICD-10-CM | POA: Diagnosis not present

## 2017-01-27 DIAGNOSIS — M25551 Pain in right hip: Secondary | ICD-10-CM | POA: Diagnosis not present

## 2017-01-27 DIAGNOSIS — R2681 Unsteadiness on feet: Secondary | ICD-10-CM | POA: Diagnosis not present

## 2017-01-27 DIAGNOSIS — R278 Other lack of coordination: Secondary | ICD-10-CM | POA: Diagnosis not present

## 2017-01-27 DIAGNOSIS — M25562 Pain in left knee: Secondary | ICD-10-CM | POA: Diagnosis not present

## 2017-01-27 DIAGNOSIS — M545 Low back pain: Secondary | ICD-10-CM | POA: Diagnosis not present

## 2017-01-28 DIAGNOSIS — M25551 Pain in right hip: Secondary | ICD-10-CM | POA: Diagnosis not present

## 2017-01-28 DIAGNOSIS — M25562 Pain in left knee: Secondary | ICD-10-CM | POA: Diagnosis not present

## 2017-01-28 DIAGNOSIS — M545 Low back pain: Secondary | ICD-10-CM | POA: Diagnosis not present

## 2017-01-28 DIAGNOSIS — R278 Other lack of coordination: Secondary | ICD-10-CM | POA: Diagnosis not present

## 2017-01-28 DIAGNOSIS — M6281 Muscle weakness (generalized): Secondary | ICD-10-CM | POA: Diagnosis not present

## 2017-01-28 DIAGNOSIS — R2681 Unsteadiness on feet: Secondary | ICD-10-CM | POA: Diagnosis not present

## 2017-01-29 DIAGNOSIS — M545 Low back pain: Secondary | ICD-10-CM | POA: Diagnosis not present

## 2017-01-29 DIAGNOSIS — R278 Other lack of coordination: Secondary | ICD-10-CM | POA: Diagnosis not present

## 2017-01-29 DIAGNOSIS — M25551 Pain in right hip: Secondary | ICD-10-CM | POA: Diagnosis not present

## 2017-01-29 DIAGNOSIS — R2681 Unsteadiness on feet: Secondary | ICD-10-CM | POA: Diagnosis not present

## 2017-01-29 DIAGNOSIS — M25562 Pain in left knee: Secondary | ICD-10-CM | POA: Diagnosis not present

## 2017-01-29 DIAGNOSIS — M6281 Muscle weakness (generalized): Secondary | ICD-10-CM | POA: Diagnosis not present

## 2017-01-30 DIAGNOSIS — R278 Other lack of coordination: Secondary | ICD-10-CM | POA: Diagnosis not present

## 2017-01-30 DIAGNOSIS — M6281 Muscle weakness (generalized): Secondary | ICD-10-CM | POA: Diagnosis not present

## 2017-01-30 DIAGNOSIS — R2681 Unsteadiness on feet: Secondary | ICD-10-CM | POA: Diagnosis not present

## 2017-01-30 DIAGNOSIS — M25551 Pain in right hip: Secondary | ICD-10-CM | POA: Diagnosis not present

## 2017-01-30 DIAGNOSIS — M545 Low back pain: Secondary | ICD-10-CM | POA: Diagnosis not present

## 2017-01-30 DIAGNOSIS — M25562 Pain in left knee: Secondary | ICD-10-CM | POA: Diagnosis not present

## 2017-01-31 DIAGNOSIS — M25562 Pain in left knee: Secondary | ICD-10-CM | POA: Diagnosis not present

## 2017-01-31 DIAGNOSIS — M545 Low back pain: Secondary | ICD-10-CM | POA: Diagnosis not present

## 2017-01-31 DIAGNOSIS — M25551 Pain in right hip: Secondary | ICD-10-CM | POA: Diagnosis not present

## 2017-01-31 DIAGNOSIS — M6281 Muscle weakness (generalized): Secondary | ICD-10-CM | POA: Diagnosis not present

## 2017-01-31 DIAGNOSIS — R278 Other lack of coordination: Secondary | ICD-10-CM | POA: Diagnosis not present

## 2017-01-31 DIAGNOSIS — R2681 Unsteadiness on feet: Secondary | ICD-10-CM | POA: Diagnosis not present

## 2017-02-03 DIAGNOSIS — M6281 Muscle weakness (generalized): Secondary | ICD-10-CM | POA: Diagnosis not present

## 2017-02-03 DIAGNOSIS — M25551 Pain in right hip: Secondary | ICD-10-CM | POA: Diagnosis not present

## 2017-02-03 DIAGNOSIS — M25562 Pain in left knee: Secondary | ICD-10-CM | POA: Diagnosis not present

## 2017-02-03 DIAGNOSIS — M25561 Pain in right knee: Secondary | ICD-10-CM | POA: Diagnosis not present

## 2017-02-03 DIAGNOSIS — M545 Low back pain: Secondary | ICD-10-CM | POA: Diagnosis not present

## 2017-02-03 DIAGNOSIS — R2681 Unsteadiness on feet: Secondary | ICD-10-CM | POA: Diagnosis not present

## 2017-02-03 DIAGNOSIS — R278 Other lack of coordination: Secondary | ICD-10-CM | POA: Diagnosis not present

## 2017-02-04 DIAGNOSIS — M6281 Muscle weakness (generalized): Secondary | ICD-10-CM | POA: Diagnosis not present

## 2017-02-04 DIAGNOSIS — M25562 Pain in left knee: Secondary | ICD-10-CM | POA: Diagnosis not present

## 2017-02-04 DIAGNOSIS — M25551 Pain in right hip: Secondary | ICD-10-CM | POA: Diagnosis not present

## 2017-02-04 DIAGNOSIS — R278 Other lack of coordination: Secondary | ICD-10-CM | POA: Diagnosis not present

## 2017-02-04 DIAGNOSIS — M545 Low back pain: Secondary | ICD-10-CM | POA: Diagnosis not present

## 2017-02-04 DIAGNOSIS — R2681 Unsteadiness on feet: Secondary | ICD-10-CM | POA: Diagnosis not present

## 2017-02-06 DIAGNOSIS — M6281 Muscle weakness (generalized): Secondary | ICD-10-CM | POA: Diagnosis not present

## 2017-02-06 DIAGNOSIS — M25562 Pain in left knee: Secondary | ICD-10-CM | POA: Diagnosis not present

## 2017-02-06 DIAGNOSIS — M25551 Pain in right hip: Secondary | ICD-10-CM | POA: Diagnosis not present

## 2017-02-06 DIAGNOSIS — M545 Low back pain: Secondary | ICD-10-CM | POA: Diagnosis not present

## 2017-02-06 DIAGNOSIS — R2681 Unsteadiness on feet: Secondary | ICD-10-CM | POA: Diagnosis not present

## 2017-02-06 DIAGNOSIS — R278 Other lack of coordination: Secondary | ICD-10-CM | POA: Diagnosis not present

## 2017-02-07 DIAGNOSIS — M25562 Pain in left knee: Secondary | ICD-10-CM | POA: Diagnosis not present

## 2017-02-07 DIAGNOSIS — M6281 Muscle weakness (generalized): Secondary | ICD-10-CM | POA: Diagnosis not present

## 2017-02-07 DIAGNOSIS — M545 Low back pain: Secondary | ICD-10-CM | POA: Diagnosis not present

## 2017-02-07 DIAGNOSIS — R2681 Unsteadiness on feet: Secondary | ICD-10-CM | POA: Diagnosis not present

## 2017-02-07 DIAGNOSIS — R278 Other lack of coordination: Secondary | ICD-10-CM | POA: Diagnosis not present

## 2017-02-07 DIAGNOSIS — M25551 Pain in right hip: Secondary | ICD-10-CM | POA: Diagnosis not present

## 2017-02-10 DIAGNOSIS — M1711 Unilateral primary osteoarthritis, right knee: Secondary | ICD-10-CM | POA: Diagnosis not present

## 2017-02-10 DIAGNOSIS — M6281 Muscle weakness (generalized): Secondary | ICD-10-CM | POA: Diagnosis not present

## 2017-02-10 DIAGNOSIS — M25551 Pain in right hip: Secondary | ICD-10-CM | POA: Diagnosis not present

## 2017-02-10 DIAGNOSIS — R278 Other lack of coordination: Secondary | ICD-10-CM | POA: Diagnosis not present

## 2017-02-10 DIAGNOSIS — R2681 Unsteadiness on feet: Secondary | ICD-10-CM | POA: Diagnosis not present

## 2017-02-10 DIAGNOSIS — M25562 Pain in left knee: Secondary | ICD-10-CM | POA: Diagnosis not present

## 2017-02-10 DIAGNOSIS — M1712 Unilateral primary osteoarthritis, left knee: Secondary | ICD-10-CM | POA: Diagnosis not present

## 2017-02-10 DIAGNOSIS — M545 Low back pain: Secondary | ICD-10-CM | POA: Diagnosis not present

## 2017-02-11 DIAGNOSIS — M25562 Pain in left knee: Secondary | ICD-10-CM | POA: Diagnosis not present

## 2017-02-11 DIAGNOSIS — M25551 Pain in right hip: Secondary | ICD-10-CM | POA: Diagnosis not present

## 2017-02-11 DIAGNOSIS — R278 Other lack of coordination: Secondary | ICD-10-CM | POA: Diagnosis not present

## 2017-02-11 DIAGNOSIS — M6281 Muscle weakness (generalized): Secondary | ICD-10-CM | POA: Diagnosis not present

## 2017-02-11 DIAGNOSIS — R2681 Unsteadiness on feet: Secondary | ICD-10-CM | POA: Diagnosis not present

## 2017-02-11 DIAGNOSIS — M545 Low back pain: Secondary | ICD-10-CM | POA: Diagnosis not present

## 2017-02-12 DIAGNOSIS — R2681 Unsteadiness on feet: Secondary | ICD-10-CM | POA: Diagnosis not present

## 2017-02-12 DIAGNOSIS — R278 Other lack of coordination: Secondary | ICD-10-CM | POA: Diagnosis not present

## 2017-02-12 DIAGNOSIS — M6281 Muscle weakness (generalized): Secondary | ICD-10-CM | POA: Diagnosis not present

## 2017-02-12 DIAGNOSIS — M545 Low back pain: Secondary | ICD-10-CM | POA: Diagnosis not present

## 2017-02-12 DIAGNOSIS — M25562 Pain in left knee: Secondary | ICD-10-CM | POA: Diagnosis not present

## 2017-02-12 DIAGNOSIS — M25551 Pain in right hip: Secondary | ICD-10-CM | POA: Diagnosis not present

## 2017-02-17 DIAGNOSIS — R2681 Unsteadiness on feet: Secondary | ICD-10-CM | POA: Diagnosis not present

## 2017-02-17 DIAGNOSIS — S32020D Wedge compression fracture of second lumbar vertebra, subsequent encounter for fracture with routine healing: Secondary | ICD-10-CM | POA: Diagnosis not present

## 2017-02-17 DIAGNOSIS — M25562 Pain in left knee: Secondary | ICD-10-CM | POA: Diagnosis not present

## 2017-02-17 DIAGNOSIS — M25551 Pain in right hip: Secondary | ICD-10-CM | POA: Diagnosis not present

## 2017-02-17 DIAGNOSIS — M810 Age-related osteoporosis without current pathological fracture: Secondary | ICD-10-CM | POA: Diagnosis not present

## 2017-02-17 DIAGNOSIS — N183 Chronic kidney disease, stage 3 (moderate): Secondary | ICD-10-CM | POA: Diagnosis not present

## 2017-02-17 DIAGNOSIS — I129 Hypertensive chronic kidney disease with stage 1 through stage 4 chronic kidney disease, or unspecified chronic kidney disease: Secondary | ICD-10-CM | POA: Diagnosis not present

## 2017-02-17 DIAGNOSIS — R278 Other lack of coordination: Secondary | ICD-10-CM | POA: Diagnosis not present

## 2017-02-17 DIAGNOSIS — M6281 Muscle weakness (generalized): Secondary | ICD-10-CM | POA: Diagnosis not present

## 2017-02-17 DIAGNOSIS — M545 Low back pain: Secondary | ICD-10-CM | POA: Diagnosis not present

## 2017-02-18 DIAGNOSIS — R278 Other lack of coordination: Secondary | ICD-10-CM | POA: Diagnosis not present

## 2017-02-18 DIAGNOSIS — M6281 Muscle weakness (generalized): Secondary | ICD-10-CM | POA: Diagnosis not present

## 2017-02-18 DIAGNOSIS — R2681 Unsteadiness on feet: Secondary | ICD-10-CM | POA: Diagnosis not present

## 2017-02-18 DIAGNOSIS — M545 Low back pain: Secondary | ICD-10-CM | POA: Diagnosis not present

## 2017-02-18 DIAGNOSIS — M25551 Pain in right hip: Secondary | ICD-10-CM | POA: Diagnosis not present

## 2017-02-18 DIAGNOSIS — M25562 Pain in left knee: Secondary | ICD-10-CM | POA: Diagnosis not present

## 2017-02-19 DIAGNOSIS — H401132 Primary open-angle glaucoma, bilateral, moderate stage: Secondary | ICD-10-CM | POA: Diagnosis not present

## 2017-02-20 DIAGNOSIS — M25562 Pain in left knee: Secondary | ICD-10-CM | POA: Diagnosis not present

## 2017-02-20 DIAGNOSIS — M6281 Muscle weakness (generalized): Secondary | ICD-10-CM | POA: Diagnosis not present

## 2017-02-20 DIAGNOSIS — R278 Other lack of coordination: Secondary | ICD-10-CM | POA: Diagnosis not present

## 2017-02-20 DIAGNOSIS — M545 Low back pain: Secondary | ICD-10-CM | POA: Diagnosis not present

## 2017-02-20 DIAGNOSIS — M25551 Pain in right hip: Secondary | ICD-10-CM | POA: Diagnosis not present

## 2017-02-20 DIAGNOSIS — R2681 Unsteadiness on feet: Secondary | ICD-10-CM | POA: Diagnosis not present

## 2017-02-24 DIAGNOSIS — M25551 Pain in right hip: Secondary | ICD-10-CM | POA: Diagnosis not present

## 2017-02-24 DIAGNOSIS — M545 Low back pain: Secondary | ICD-10-CM | POA: Diagnosis not present

## 2017-02-24 DIAGNOSIS — M6281 Muscle weakness (generalized): Secondary | ICD-10-CM | POA: Diagnosis not present

## 2017-02-24 DIAGNOSIS — R278 Other lack of coordination: Secondary | ICD-10-CM | POA: Diagnosis not present

## 2017-02-24 DIAGNOSIS — R42 Dizziness and giddiness: Secondary | ICD-10-CM | POA: Diagnosis not present

## 2017-02-24 DIAGNOSIS — M25562 Pain in left knee: Secondary | ICD-10-CM | POA: Diagnosis not present

## 2017-02-24 DIAGNOSIS — R2681 Unsteadiness on feet: Secondary | ICD-10-CM | POA: Diagnosis not present

## 2017-02-25 ENCOUNTER — Ambulatory Visit (INDEPENDENT_AMBULATORY_CARE_PROVIDER_SITE_OTHER): Payer: Medicare Other | Admitting: Licensed Clinical Social Worker

## 2017-02-25 DIAGNOSIS — F3341 Major depressive disorder, recurrent, in partial remission: Secondary | ICD-10-CM | POA: Diagnosis not present

## 2017-02-26 DIAGNOSIS — R2681 Unsteadiness on feet: Secondary | ICD-10-CM | POA: Diagnosis not present

## 2017-02-26 DIAGNOSIS — M25551 Pain in right hip: Secondary | ICD-10-CM | POA: Diagnosis not present

## 2017-02-26 DIAGNOSIS — M6281 Muscle weakness (generalized): Secondary | ICD-10-CM | POA: Diagnosis not present

## 2017-02-26 DIAGNOSIS — R278 Other lack of coordination: Secondary | ICD-10-CM | POA: Diagnosis not present

## 2017-02-26 DIAGNOSIS — M25562 Pain in left knee: Secondary | ICD-10-CM | POA: Diagnosis not present

## 2017-02-26 DIAGNOSIS — M545 Low back pain: Secondary | ICD-10-CM | POA: Diagnosis not present

## 2017-03-01 DIAGNOSIS — M545 Low back pain: Secondary | ICD-10-CM | POA: Diagnosis not present

## 2017-03-04 DIAGNOSIS — M545 Low back pain: Secondary | ICD-10-CM | POA: Diagnosis not present

## 2017-03-04 DIAGNOSIS — M25562 Pain in left knee: Secondary | ICD-10-CM | POA: Diagnosis not present

## 2017-03-04 DIAGNOSIS — M25551 Pain in right hip: Secondary | ICD-10-CM | POA: Diagnosis not present

## 2017-03-04 DIAGNOSIS — R2681 Unsteadiness on feet: Secondary | ICD-10-CM | POA: Diagnosis not present

## 2017-03-04 DIAGNOSIS — M6281 Muscle weakness (generalized): Secondary | ICD-10-CM | POA: Diagnosis not present

## 2017-03-04 DIAGNOSIS — R278 Other lack of coordination: Secondary | ICD-10-CM | POA: Diagnosis not present

## 2017-03-06 DIAGNOSIS — M545 Low back pain: Secondary | ICD-10-CM | POA: Diagnosis not present

## 2017-03-06 DIAGNOSIS — M6281 Muscle weakness (generalized): Secondary | ICD-10-CM | POA: Diagnosis not present

## 2017-03-06 DIAGNOSIS — M25551 Pain in right hip: Secondary | ICD-10-CM | POA: Diagnosis not present

## 2017-03-06 DIAGNOSIS — R278 Other lack of coordination: Secondary | ICD-10-CM | POA: Diagnosis not present

## 2017-03-06 DIAGNOSIS — M25562 Pain in left knee: Secondary | ICD-10-CM | POA: Diagnosis not present

## 2017-03-06 DIAGNOSIS — R2681 Unsteadiness on feet: Secondary | ICD-10-CM | POA: Diagnosis not present

## 2017-03-10 ENCOUNTER — Ambulatory Visit (INDEPENDENT_AMBULATORY_CARE_PROVIDER_SITE_OTHER): Payer: Medicare Other | Admitting: Podiatry

## 2017-03-10 ENCOUNTER — Encounter: Payer: Self-pay | Admitting: Podiatry

## 2017-03-10 DIAGNOSIS — M79675 Pain in left toe(s): Secondary | ICD-10-CM

## 2017-03-10 DIAGNOSIS — M79674 Pain in right toe(s): Secondary | ICD-10-CM

## 2017-03-10 DIAGNOSIS — D689 Coagulation defect, unspecified: Secondary | ICD-10-CM

## 2017-03-10 DIAGNOSIS — L84 Corns and callosities: Secondary | ICD-10-CM

## 2017-03-10 DIAGNOSIS — M6281 Muscle weakness (generalized): Secondary | ICD-10-CM | POA: Diagnosis not present

## 2017-03-10 DIAGNOSIS — M25562 Pain in left knee: Secondary | ICD-10-CM | POA: Diagnosis not present

## 2017-03-10 DIAGNOSIS — R278 Other lack of coordination: Secondary | ICD-10-CM | POA: Diagnosis not present

## 2017-03-10 DIAGNOSIS — M545 Low back pain: Secondary | ICD-10-CM | POA: Diagnosis not present

## 2017-03-10 DIAGNOSIS — M25551 Pain in right hip: Secondary | ICD-10-CM | POA: Diagnosis not present

## 2017-03-10 DIAGNOSIS — B351 Tinea unguium: Secondary | ICD-10-CM | POA: Diagnosis not present

## 2017-03-10 DIAGNOSIS — R2681 Unsteadiness on feet: Secondary | ICD-10-CM | POA: Diagnosis not present

## 2017-03-10 NOTE — Progress Notes (Signed)
Cardiology Office Note    Date:  03/11/2017   ID:  [name], DOB [birth year], MRN [id number]  PCP:  [name], MD  Cardiologist: Sinclair Grooms, MD   Chief Complaint  Patient presents with  . Atrial Fibrillation    History of Present Illness:  [name] is a 83 y.o. female who presents for paroxysmal atrial fibrillation, hypertension, history pulmonary emboli, IVC filter, venous insufficiency, history of dementia, history of CVA, and chronic Lovenox therapy.   She is accompanied by her son.  She has no specific complaints.  Conversation is somewhat difficult.  No lower extremity swelling.  Some dyspnea with exertion.  Hospitalized in [place] after a fall led to several vertebral compression fractures.  Now improving.  Denies orthopnea and PND.   Past Medical History:  Diagnosis Date  . A-fib ([place])   . Acid reflux   . Anxiety   . Arthritis   . Chronic anticoagulation 03/07/2013   Lovenox 100 QD  . Chronic kidney disease    Stage III  . CVA (cerebral infarction) 1964  . Dementia    Dr Brett Fairy  . Diverticulosis   . DVT (deep venous thrombosis) ([place]) 2007   recurrent w PE s/p [place] filter  . GERD (gastroesophageal reflux disease)    w hiatal hernia endoscopy 2003, Dr Penelope Coop  . Glaucoma   . Hypercholesterolemia   . Hypertension   . Hypoglycemia   . Hypothyroidism    goiter  . IBS (irritable bowel syndrome)   . Junctional bradycardia    resolved with discontinuation of sotalol (2014) after 10 yrs  . Memory loss   . Migraine   . Osteoarthritis    in Bilateral knees and back. -Dr Latanya Maudlin  . Osteoporosis   . Panic attacks   . Phlebitis   . Postherpetic trigeminal neuralgia 10/20/2012  . Pulmonary embolism recurrent    IVC filter  . Shingles    zoster 1990 on the back and on the abdomen in 2007,facial zoster 2006  . Trigeminal neuralgia    r face--Dr Dohmeier  . Unspecified cerebral artery occlusion with cerebral infarction   . Vitamin  D deficiency     Past Surgical History:  Procedure Laterality Date  . APPENDECTOMY  1945  . biopsies of breast Bilateral 1971  . blood clots  06/2006   lower abdomen  . CARDIAC CATHETERIZATION  08/2001   A-fib/Palpitations Dr. Daneen Schick  . CHOLECYSTECTOMY  01/1983  . DILATION AND CURETTAGE, DIAGNOSTIC / THERAPEUTIC  1952/1977   x2  . FOOT SURGERY Right 1978   Cyst removed from Right Foot  . [place]  . [place]  . insertion of vena cova filter  2007  . LEG / ANKLE SOFT TISSUE BIOPSY Right    precancerous  . migraine speech impairment  07/1963  . phlebitis  12/2005  . PULMONARY EMBOLISM SURGERY  03/2005  . TONSILLECTOMY     1954    Current Medications: Outpatient Medications Prior to Visit  Medication Sig Dispense Refill  . amLODipine (NORVASC) 2.5 MG tablet TAKE 1 TABLET BY MOUTH EVERY DAY 30 tablet 2  . CARBATROL 100 MG 12 hr capsule TAKE 2 CAPSULES BY MOUTH 3 TIMES DAILY 360 capsule 2  . cholecalciferol (VITAMIN D) 1000 units tablet Take 1,000 Units by mouth daily.    Marland Kitchen enoxaparin (LOVENOX) 100 MG/ML injection Inject 100 mg into the skin at bedtime.     Marland Kitchen EPINEPHrine (EPIPEN  IJ) Inject as directed once as needed. For allergic reactions    . Fe Fum-FA-B Cmp-C-Zn-Mg-Mn-Cu (HEMOCYTE PLUS) 106-1 MG CAPS Take 1 capsule by mouth daily.     . fluticasone (FLONASE) 50 MCG/ACT nasal spray Place 1 spray into left nostril daily as needed for allergies or rhinitis.   12  . folic acid (FOLVITE) 1 MG tablet Take 1 mg by mouth every morning.     Marland Kitchen [name] 300 MG TABS Take 300 mg by mouth 3 (three) times daily.    Marland Kitchen [name] 300 MG TABS TAKE 1 TABLET BY MOUTH EVERY MORNING, 1 TABLET AT NOON, AND AT BEDTIME 270 tablet 1  . HYDROcodone-acetaminophen (NORCO/VICODIN) 5-325 MG per tablet Take 1 tablet by mouth every 6 (six) hours as needed for moderate pain.    Marland Kitchen levothyroxine (SYNTHROID, LEVOTHROID) 75 MCG tablet Take 75 mcg by mouth daily before breakfast.       . loratadine (CLARITIN) 10 MG tablet Take 10 mg by mouth daily.    . meclizine (ANTIVERT) 25 MG tablet Take 25 mg by mouth 2 (two) times daily as needed for dizziness or nausea.   1  . Multiple Vitamin (MULTIVITAMIN) tablet Take 1 tablet by mouth every morning.     Marland Kitchen NAMZARIC 28-10 MG CP24 TAKE 1 CAPSULE BY MOUTH EVERY DAY 30 capsule 6  . pravastatin (PRAVACHOL) 40 MG tablet Take 40 mg by mouth every morning.     . RABEprazole (ACIPHEX) 20 MG tablet Take 20 mg by mouth every morning.     . sodium chloride 1 G tablet Take 1 tablet (1 g total) by mouth 2 (two) times daily with a meal. 60 tablet 0  . Travoprost, BAK Free, (TRAVATAN) 0.004 % SOLN ophthalmic solution Place 1 drop into both eyes at bedtime.     No facility-administered medications prior to visit.      Allergies:   Benadryl [diphenhydramine hcl]; Eye drops [tetrahydrozoline hcl]; Flu virus vaccine; Iohexol; Shrimp [shellfish allergy]; Voltaren [diclofenac sodium]; Ambien [zolpidem tartrate]; Gluten meal; Travatan z [travoprost]; Ativan [lorazepam]; Hydrocodone; Betadine antibiotic-moisturize [bacitracin-polymyxin b]; Biaxin [clarithromycin]; Celebrex [celecoxib]; Ciprofloxacin hcl; Combigan [brimonidine tartrate-timolol]; Coumadin [warfarin sodium]; Ivp dye [iodinated diagnostic agents]; Levaquin [levofloxacin in d5w]; Naprosyn [naproxen]; Pataday [olopatadine hcl]; Penicillins; Plavix [clopidogrel bisulfate]; and Septra [sulfamethoxazole-trimethoprim]   Social History   Socioeconomic History  . Marital status: Divorced    Spouse name: None  . Number of children: 3  . Years of education: 62  . Highest education level: None  Social Needs  . Financial resource strain: None  . Food insecurity - worry: None  . Food insecurity - inability: None  . Transportation needs - medical: None  . Transportation needs - non-medical: None  Occupational History  . 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 Smoking status: Never Smoker  . Smokeless tobacco: Never Used  Substance and Sexual Activity  . Alcohol use: No    Comment: very moderate, none  in the last two months  . Drug use: No  . Sexual activity: No  Other Topics Concern  . None  Social History Narrative   Patient is retired and lives at home alone. Patient has a college education.    Patient has three children.   Patient is right-handed.   Patient drinks 1-2 cups of caffeine daily.     Family History:  The patient's family history includes Breast cancer in her maternal aunt; CAD in  her mother; CVA in her brother and mother; Colon cancer in her maternal uncle; Diabetes in her mother; Glaucoma in her mother, other, and other; Heart attack in her brother and father; Hypertension in her brother, father, and mother; Kidney failure in her father; Prostate cancer in her maternal grandfather; Rectal cancer in her maternal uncle.   ROS:   Please see the history of present illness.    Back pain, memory loss, guarding with ambulation and balance.  Occasional diarrhea.  Depression.  Occasional palpitations but self-limited.  Recent hospital stay in [place] not associated with cardiac complications. All other systems reviewed and are negative.   PHYSICAL EXAM:   VS:  BP (!) 150/66   Pulse 76   Ht 5\' 3"  (1.6 m)   Wt 213 lb 6.4 oz (96.8 kg)   BMI 37.80 kg/m    GEN: Well nourished, well developed, in no acute distress  HEENT: normal  Neck: no JVD, carotid bruits, or masses Cardiac: RRR; no murmurs, rubs, or gallops,no edema  Respiratory:  clear to auscultation bilaterally, normal work of breathing GI: soft, nontender, nondistended, + BS MS: no deformity or atrophy  Skin: warm and dry, no rash Neuro:  Alert and Oriented x 3, Strength and sensation are intact Psych: euthymic mood, full affect  Wt Readings from Last 3 Encounters:  03/11/17 213 lb 6.4 oz (96.8 kg)  11/22/16 224 lb 6.4 oz (101.8 kg)  02/02/16 225  lb (102.1 kg)      Studies/Labs Reviewed:   EKG:  EKG sinus rhythm, nonspecific T wave flattening.  Recent Labs: No results found for requested labs within last 8760 hours.   Lipid Panel    Component Value Date/Time   CHOL 156 12/04/2013 0615   TRIG 72 12/04/2013 0615   HDL 76 12/04/2013 0615   CHOLHDL 2.1 12/04/2013 0615   VLDL 14 12/04/2013 0615   LDLCALC 66 12/04/2013 0615    Additional studies/ records that were reviewed today include:  No data    ASSESSMENT:    1. PAF (paroxysmal atrial fibrillation) ([place])   2. TIA (transient ischemic attack)   3. Essential hypertension   4. Chronic anticoagulation      PLAN:  In order of problems listed above:  1. No current complaints.  Has occasional palpitations.  It causes anxiety when present.  Episodes are short-lived.  She is protected with subcutaneous Lovenox daily. 2. Not addressed. 3. Repeat blood pressure 138/60 mmHg.  The patient was reassured that her blood pressure is doing okay. 4. No bleeding complications.  Previously on Coumadin now on Lovenox  We are not currently adding value with routine cardiology follow-up.  I will see her in the future as needed.  This was discussed with the patient and the son.  Medication Adjustments/Labs and Tests Ordered: Current medicines are reviewed at length with the patient today.  Concerns regarding medicines are outlined above.  Medication changes, Labs and Tests ordered today are listed in the Patient Instructions below. Patient Instructions  Medication Instructions:  Your physician recommends that you continue on your current medications as directed. Please refer to the Current Medication list given to you today.  Labwork: None  Testing/Procedures: None  Follow-Up: Your physician recommends that you schedule a follow-up appointment as needed with Dr. Tamala Julian.     Any Other Special Instructions Will Be Listed Below (If Applicable).     If you need a refill on  your cardiac medications before your next appointment, please call your pharmacy.  Signed, Sinclair Grooms, MD  03/11/2017 11:51 AM    [address], [place], [place]  [address] Phone: [phone]; Fax: [phone]

## 2017-03-11 ENCOUNTER — Ambulatory Visit (INDEPENDENT_AMBULATORY_CARE_PROVIDER_SITE_OTHER): Payer: Medicare Other | Admitting: Interventional Cardiology

## 2017-03-11 ENCOUNTER — Encounter: Payer: Self-pay | Admitting: Interventional Cardiology

## 2017-03-11 VITALS — BP 150/66 | HR 76 | Ht 63.0 in | Wt 213.4 lb

## 2017-03-11 DIAGNOSIS — I10 Essential (primary) hypertension: Secondary | ICD-10-CM | POA: Diagnosis not present

## 2017-03-11 DIAGNOSIS — Z7901 Long term (current) use of anticoagulants: Secondary | ICD-10-CM | POA: Diagnosis not present

## 2017-03-11 DIAGNOSIS — G459 Transient cerebral ischemic attack, unspecified: Secondary | ICD-10-CM | POA: Diagnosis not present

## 2017-03-11 DIAGNOSIS — I480 Paroxysmal atrial fibrillation: Secondary | ICD-10-CM

## 2017-03-11 NOTE — Patient Instructions (Signed)
Medication Instructions:  Your physician recommends that you continue on your current medications as directed. Please refer to the Current Medication list given to you today.  Labwork: None  Testing/Procedures: None  Follow-Up: Your physician recommends that you schedule a follow-up appointment as needed with Dr. Smith.     Any Other Special Instructions Will Be Listed Below (If Applicable).     If you need a refill on your cardiac medications before your next appointment, please call your pharmacy.   

## 2017-03-12 DIAGNOSIS — M1711 Unilateral primary osteoarthritis, right knee: Secondary | ICD-10-CM | POA: Diagnosis not present

## 2017-03-12 DIAGNOSIS — M1712 Unilateral primary osteoarthritis, left knee: Secondary | ICD-10-CM | POA: Diagnosis not present

## 2017-03-12 NOTE — Progress Notes (Signed)
Subjective:   Patient ID: [name], female   DOB: 83 y.o.   MRN: [id number]   HPI Patient presents with thick yellow brittle nailbeds 1-5 both feet that are painful   ROS      Objective:  Physical Exam  Neurovascular status intact the patient noted to have thick yellow nailbeds 1-5 both feet that are painful and irritated in the corners     Assessment:  Mycotic nail infection with pain 1-5 both feet     Plan:  Debride painful nailbeds 1-5 both feet with no iatrogenic bleeding noted

## 2017-03-13 DIAGNOSIS — M25562 Pain in left knee: Secondary | ICD-10-CM | POA: Diagnosis not present

## 2017-03-13 DIAGNOSIS — M6281 Muscle weakness (generalized): Secondary | ICD-10-CM | POA: Diagnosis not present

## 2017-03-13 DIAGNOSIS — M25551 Pain in right hip: Secondary | ICD-10-CM | POA: Diagnosis not present

## 2017-03-13 DIAGNOSIS — R278 Other lack of coordination: Secondary | ICD-10-CM | POA: Diagnosis not present

## 2017-03-13 DIAGNOSIS — R2681 Unsteadiness on feet: Secondary | ICD-10-CM | POA: Diagnosis not present

## 2017-03-13 DIAGNOSIS — M545 Low back pain: Secondary | ICD-10-CM | POA: Diagnosis not present

## 2017-03-13 NOTE — Addendum Note (Signed)
Addended by: Velna Ochs on: 03/13/2017 08:42 AM   Modules accepted: Orders

## 2017-03-19 DIAGNOSIS — R2681 Unsteadiness on feet: Secondary | ICD-10-CM | POA: Diagnosis not present

## 2017-03-19 DIAGNOSIS — M25562 Pain in left knee: Secondary | ICD-10-CM | POA: Diagnosis not present

## 2017-03-19 DIAGNOSIS — R278 Other lack of coordination: Secondary | ICD-10-CM | POA: Diagnosis not present

## 2017-03-19 DIAGNOSIS — M25551 Pain in right hip: Secondary | ICD-10-CM | POA: Diagnosis not present

## 2017-03-19 DIAGNOSIS — M6281 Muscle weakness (generalized): Secondary | ICD-10-CM | POA: Diagnosis not present

## 2017-03-19 DIAGNOSIS — M545 Low back pain: Secondary | ICD-10-CM | POA: Diagnosis not present

## 2017-03-24 DIAGNOSIS — M6281 Muscle weakness (generalized): Secondary | ICD-10-CM | POA: Diagnosis not present

## 2017-03-24 DIAGNOSIS — M545 Low back pain: Secondary | ICD-10-CM | POA: Diagnosis not present

## 2017-03-24 DIAGNOSIS — M25562 Pain in left knee: Secondary | ICD-10-CM | POA: Diagnosis not present

## 2017-03-24 DIAGNOSIS — M25551 Pain in right hip: Secondary | ICD-10-CM | POA: Diagnosis not present

## 2017-03-24 DIAGNOSIS — R2681 Unsteadiness on feet: Secondary | ICD-10-CM | POA: Diagnosis not present

## 2017-03-24 DIAGNOSIS — R278 Other lack of coordination: Secondary | ICD-10-CM | POA: Diagnosis not present

## 2017-03-27 DIAGNOSIS — M25562 Pain in left knee: Secondary | ICD-10-CM | POA: Diagnosis not present

## 2017-03-27 DIAGNOSIS — R278 Other lack of coordination: Secondary | ICD-10-CM | POA: Diagnosis not present

## 2017-03-27 DIAGNOSIS — R42 Dizziness and giddiness: Secondary | ICD-10-CM | POA: Diagnosis not present

## 2017-05-06 ENCOUNTER — Ambulatory Visit (INDEPENDENT_AMBULATORY_CARE_PROVIDER_SITE_OTHER): Payer: Medicare Other | Admitting: Licensed Clinical Social Worker

## 2017-05-06 DIAGNOSIS — F3341 Major depressive disorder, recurrent, in partial remission: Secondary | ICD-10-CM

## 2017-05-08 ENCOUNTER — Other Ambulatory Visit: Payer: Self-pay | Admitting: Interventional Cardiology

## 2017-05-22 DIAGNOSIS — N183 Chronic kidney disease, stage 3 (moderate): Secondary | ICD-10-CM | POA: Diagnosis not present

## 2017-05-22 DIAGNOSIS — F419 Anxiety disorder, unspecified: Secondary | ICD-10-CM | POA: Diagnosis not present

## 2017-05-22 DIAGNOSIS — I129 Hypertensive chronic kidney disease with stage 1 through stage 4 chronic kidney disease, or unspecified chronic kidney disease: Secondary | ICD-10-CM | POA: Diagnosis not present

## 2017-05-22 DIAGNOSIS — E441 Mild protein-calorie malnutrition: Secondary | ICD-10-CM | POA: Diagnosis not present

## 2017-05-22 DIAGNOSIS — E559 Vitamin D deficiency, unspecified: Secondary | ICD-10-CM | POA: Diagnosis not present

## 2017-05-22 DIAGNOSIS — Z86711 Personal history of pulmonary embolism: Secondary | ICD-10-CM | POA: Diagnosis not present

## 2017-05-22 DIAGNOSIS — I480 Paroxysmal atrial fibrillation: Secondary | ICD-10-CM | POA: Diagnosis not present

## 2017-05-22 DIAGNOSIS — D638 Anemia in other chronic diseases classified elsewhere: Secondary | ICD-10-CM | POA: Diagnosis not present

## 2017-05-22 DIAGNOSIS — F324 Major depressive disorder, single episode, in partial remission: Secondary | ICD-10-CM | POA: Diagnosis not present

## 2017-05-22 DIAGNOSIS — E039 Hypothyroidism, unspecified: Secondary | ICD-10-CM | POA: Diagnosis not present

## 2017-05-22 DIAGNOSIS — M79672 Pain in left foot: Secondary | ICD-10-CM | POA: Diagnosis not present

## 2017-06-02 DIAGNOSIS — H401132 Primary open-angle glaucoma, bilateral, moderate stage: Secondary | ICD-10-CM | POA: Diagnosis not present

## 2017-06-09 ENCOUNTER — Ambulatory Visit: Payer: Medicare Other | Admitting: Podiatry

## 2017-06-12 ENCOUNTER — Ambulatory Visit (INDEPENDENT_AMBULATORY_CARE_PROVIDER_SITE_OTHER): Payer: Medicare Other | Admitting: Licensed Clinical Social Worker

## 2017-06-12 DIAGNOSIS — F3341 Major depressive disorder, recurrent, in partial remission: Secondary | ICD-10-CM

## 2017-06-17 ENCOUNTER — Encounter: Payer: Self-pay | Admitting: Podiatry

## 2017-06-17 ENCOUNTER — Ambulatory Visit (INDEPENDENT_AMBULATORY_CARE_PROVIDER_SITE_OTHER): Payer: Medicare Other | Admitting: Podiatry

## 2017-06-17 DIAGNOSIS — B351 Tinea unguium: Secondary | ICD-10-CM

## 2017-06-17 DIAGNOSIS — D689 Coagulation defect, unspecified: Secondary | ICD-10-CM

## 2017-06-17 DIAGNOSIS — M79675 Pain in left toe(s): Secondary | ICD-10-CM | POA: Diagnosis not present

## 2017-06-17 DIAGNOSIS — M79674 Pain in right toe(s): Secondary | ICD-10-CM | POA: Diagnosis not present

## 2017-06-17 NOTE — Patient Instructions (Signed)

## 2017-06-18 NOTE — Progress Notes (Signed)
Subjective: 84 y.o. returns the office today for painful, elongated, thickened toenails which she cannot trim herself. Denies any redness or drainage around the nails. Denies any acute changes since last appointment and no new complaints today. Denies any systemic complaints such as fevers, chills, nausea, vomiting.   PCP: White, Cynthia, MD  Objective: AAO 3, NAD DP/PT pulses palpable, CRT less than 3 seconds Nails hypertrophic, dystrophic, elongated, brittle, discolored 10. There is tenderness overlying the nails 1-5 bilaterally. There is no surrounding erythema or drainage along the nail sites. No open lesions or pre-ulcerative lesions are identified. No other areas of tenderness bilateral lower extremities. No overlying edema, erythema, increased warmth. No pain with calf compression, swelling, warmth, erythema.  Assessment: Patient presents with symptomatic onychomycosis  Plan: -Treatment options including alternatives, risks, complications were discussed -Nails sharply debrided 10 without complication/bleeding. -Discussed daily foot inspection. If there are any changes, to call the office immediately.  -Follow-up in 3 months or sooner if any problems are to arise. In the meantime, encouraged to call the office with any questions, concerns, changes symptoms.  Ginelle Bays, DPM  

## 2017-06-26 DIAGNOSIS — H02052 Trichiasis without entropian right lower eyelid: Secondary | ICD-10-CM | POA: Diagnosis not present

## 2017-07-10 DIAGNOSIS — Z1231 Encounter for screening mammogram for malignant neoplasm of breast: Secondary | ICD-10-CM | POA: Diagnosis not present

## 2017-07-21 ENCOUNTER — Encounter: Payer: Self-pay | Admitting: Neurology

## 2017-07-29 ENCOUNTER — Ambulatory Visit (INDEPENDENT_AMBULATORY_CARE_PROVIDER_SITE_OTHER): Payer: Medicare Other | Admitting: Licensed Clinical Social Worker

## 2017-07-29 DIAGNOSIS — F3341 Major depressive disorder, recurrent, in partial remission: Secondary | ICD-10-CM

## 2017-08-06 ENCOUNTER — Telehealth: Payer: Self-pay | Admitting: Neurology

## 2017-08-06 MED ORDER — CARBAMAZEPINE ER 100 MG PO CP12: 200.0000 mg | ORAL_CAPSULE | Freq: Two times a day (BID) | ORAL | 3 refills | Status: DC

## 2017-08-06 MED ORDER — GABAPENTIN (ONCE-DAILY) 300 MG PO TABS: 300.0000 mg | ORAL_TABLET | Freq: Two times a day (BID) | ORAL | 3 refills | Status: DC

## 2017-08-06 NOTE — Telephone Encounter (Signed)
[name] called needing to have her medications refilled due to quantity changes. Please Call. She uses [facility] on [address]. Thanks

## 2017-08-08 ENCOUNTER — Other Ambulatory Visit: Payer: Self-pay | Admitting: Interventional Cardiology

## 2017-08-22 DIAGNOSIS — N183 Chronic kidney disease, stage 3 (moderate): Secondary | ICD-10-CM | POA: Diagnosis not present

## 2017-08-22 DIAGNOSIS — R5381 Other malaise: Secondary | ICD-10-CM | POA: Diagnosis not present

## 2017-08-22 DIAGNOSIS — D638 Anemia in other chronic diseases classified elsewhere: Secondary | ICD-10-CM | POA: Diagnosis not present

## 2017-08-22 DIAGNOSIS — F324 Major depressive disorder, single episode, in partial remission: Secondary | ICD-10-CM | POA: Diagnosis not present

## 2017-08-22 DIAGNOSIS — I129 Hypertensive chronic kidney disease with stage 1 through stage 4 chronic kidney disease, or unspecified chronic kidney disease: Secondary | ICD-10-CM | POA: Diagnosis not present

## 2017-08-22 DIAGNOSIS — R197 Diarrhea, unspecified: Secondary | ICD-10-CM | POA: Diagnosis not present

## 2017-08-28 DIAGNOSIS — Z9181 History of falling: Secondary | ICD-10-CM | POA: Diagnosis not present

## 2017-08-28 DIAGNOSIS — R2681 Unsteadiness on feet: Secondary | ICD-10-CM | POA: Diagnosis not present

## 2017-08-29 DIAGNOSIS — Z9181 History of falling: Secondary | ICD-10-CM | POA: Diagnosis not present

## 2017-08-29 DIAGNOSIS — R2681 Unsteadiness on feet: Secondary | ICD-10-CM | POA: Diagnosis not present

## 2017-09-01 DIAGNOSIS — R2681 Unsteadiness on feet: Secondary | ICD-10-CM | POA: Diagnosis not present

## 2017-09-01 DIAGNOSIS — Z9181 History of falling: Secondary | ICD-10-CM | POA: Diagnosis not present

## 2017-09-02 ENCOUNTER — Ambulatory Visit (INDEPENDENT_AMBULATORY_CARE_PROVIDER_SITE_OTHER): Payer: Medicare Other | Admitting: Licensed Clinical Social Worker

## 2017-09-02 ENCOUNTER — Telehealth: Payer: Self-pay | Admitting: Neurology

## 2017-09-02 DIAGNOSIS — R2681 Unsteadiness on feet: Secondary | ICD-10-CM | POA: Diagnosis not present

## 2017-09-02 DIAGNOSIS — Z9181 History of falling: Secondary | ICD-10-CM | POA: Diagnosis not present

## 2017-09-02 DIAGNOSIS — F3341 Major depressive disorder, recurrent, in partial remission: Secondary | ICD-10-CM | POA: Diagnosis not present

## 2017-09-02 MED ORDER — MEMANTINE HCL-DONEPEZIL HCL 28-10 MG PO CP24
1.0000 | ORAL_CAPSULE | Freq: Every day | ORAL | 3 refills | Status: DC
Start: 2017-09-02 — End: 2017-12-23

## 2017-09-05 DIAGNOSIS — Z9181 History of falling: Secondary | ICD-10-CM | POA: Diagnosis not present

## 2017-09-05 DIAGNOSIS — R2681 Unsteadiness on feet: Secondary | ICD-10-CM | POA: Diagnosis not present

## 2017-09-08 ENCOUNTER — Telehealth: Payer: Self-pay | Admitting: Neurology

## 2017-09-08 NOTE — Telephone Encounter (Signed)
Called pharmacy and requested for them to state generic not allowed.

## 2017-09-08 NOTE — Telephone Encounter (Signed)
[name] called needing to get an order sent in for patient's Carbatrol. They need an order sent saying Generic Substitution not allowed, because Insurance will not pay if not sent in. Thanks

## 2017-09-09 DIAGNOSIS — R2681 Unsteadiness on feet: Secondary | ICD-10-CM | POA: Diagnosis not present

## 2017-09-09 DIAGNOSIS — Z9181 History of falling: Secondary | ICD-10-CM | POA: Diagnosis not present

## 2017-09-11 ENCOUNTER — Ambulatory Visit: Payer: Medicare Other | Admitting: Licensed Clinical Social Worker

## 2017-09-11 DIAGNOSIS — Z9181 History of falling: Secondary | ICD-10-CM | POA: Diagnosis not present

## 2017-09-11 DIAGNOSIS — R2681 Unsteadiness on feet: Secondary | ICD-10-CM | POA: Diagnosis not present

## 2017-09-15 DIAGNOSIS — R197 Diarrhea, unspecified: Secondary | ICD-10-CM | POA: Diagnosis not present

## 2017-09-15 DIAGNOSIS — K5790 Diverticulosis of intestine, part unspecified, without perforation or abscess without bleeding: Secondary | ICD-10-CM | POA: Diagnosis not present

## 2017-09-18 ENCOUNTER — Encounter: Payer: Self-pay | Admitting: Podiatry

## 2017-09-18 ENCOUNTER — Ambulatory Visit (INDEPENDENT_AMBULATORY_CARE_PROVIDER_SITE_OTHER): Payer: Medicare Other | Admitting: Podiatry

## 2017-09-18 DIAGNOSIS — R2681 Unsteadiness on feet: Secondary | ICD-10-CM | POA: Diagnosis not present

## 2017-09-18 DIAGNOSIS — M79674 Pain in right toe(s): Secondary | ICD-10-CM | POA: Diagnosis not present

## 2017-09-18 DIAGNOSIS — M79675 Pain in left toe(s): Secondary | ICD-10-CM | POA: Diagnosis not present

## 2017-09-18 DIAGNOSIS — B351 Tinea unguium: Secondary | ICD-10-CM

## 2017-09-18 DIAGNOSIS — Z9181 History of falling: Secondary | ICD-10-CM | POA: Diagnosis not present

## 2017-09-18 DIAGNOSIS — D689 Coagulation defect, unspecified: Secondary | ICD-10-CM

## 2017-09-18 NOTE — Progress Notes (Signed)
Subjective: 84 y.o. returns the office today for painful, elongated, thickened toenails which she cannot trim herself. Denies any redness or drainage around the nails. Denies any acute changes since last appointment and no new complaints today. Denies any systemic complaints such as fevers, chills, nausea, vomiting.   PCP: [name], MD  Objective: AAO 3, NAD DP/PT pulses palpable, CRT less than 3 seconds Nails hypertrophic, dystrophic, elongated, brittle, discolored 10. There is tenderness overlying the nails 1-5 bilaterally. There is no surrounding erythema or drainage along the nail sites. No open lesions or pre-ulcerative lesions are identified. No other areas of tenderness bilateral lower extremities. No overlying edema, erythema, increased warmth. No pain with calf compression, swelling, warmth, erythema.  Assessment: Patient presents with symptomatic onychomycosis  Plan: -Treatment options including alternatives, risks, complications were discussed -Nails sharply debrided 10 without complication/bleeding. -Discussed daily foot inspection. If there are any changes, to call the office immediately.  -Follow-up in 3 months or sooner if any problems are to arise. In the meantime, encouraged to call the office with any questions, concerns, changes symptoms.  Celesta Gentile, DPM

## 2017-09-19 DIAGNOSIS — Z9181 History of falling: Secondary | ICD-10-CM | POA: Diagnosis not present

## 2017-09-19 DIAGNOSIS — R2681 Unsteadiness on feet: Secondary | ICD-10-CM | POA: Diagnosis not present

## 2017-09-22 DIAGNOSIS — R2681 Unsteadiness on feet: Secondary | ICD-10-CM | POA: Diagnosis not present

## 2017-09-22 DIAGNOSIS — Z9181 History of falling: Secondary | ICD-10-CM | POA: Diagnosis not present

## 2017-09-23 DIAGNOSIS — R2681 Unsteadiness on feet: Secondary | ICD-10-CM | POA: Diagnosis not present

## 2017-09-23 DIAGNOSIS — Z9181 History of falling: Secondary | ICD-10-CM | POA: Diagnosis not present

## 2017-09-26 DIAGNOSIS — R2681 Unsteadiness on feet: Secondary | ICD-10-CM | POA: Diagnosis not present

## 2017-09-26 DIAGNOSIS — Z9181 History of falling: Secondary | ICD-10-CM | POA: Diagnosis not present

## 2017-09-29 ENCOUNTER — Other Ambulatory Visit: Payer: Self-pay | Admitting: Neurology

## 2017-09-29 DIAGNOSIS — Z9181 History of falling: Secondary | ICD-10-CM | POA: Diagnosis not present

## 2017-09-29 DIAGNOSIS — R2681 Unsteadiness on feet: Secondary | ICD-10-CM | POA: Diagnosis not present

## 2017-10-02 DIAGNOSIS — Z9181 History of falling: Secondary | ICD-10-CM | POA: Diagnosis not present

## 2017-10-02 DIAGNOSIS — R2681 Unsteadiness on feet: Secondary | ICD-10-CM | POA: Diagnosis not present

## 2017-10-03 DIAGNOSIS — R2681 Unsteadiness on feet: Secondary | ICD-10-CM | POA: Diagnosis not present

## 2017-10-03 DIAGNOSIS — Z9181 History of falling: Secondary | ICD-10-CM | POA: Diagnosis not present

## 2017-10-06 DIAGNOSIS — R2681 Unsteadiness on feet: Secondary | ICD-10-CM | POA: Diagnosis not present

## 2017-10-06 DIAGNOSIS — Z9181 History of falling: Secondary | ICD-10-CM | POA: Diagnosis not present

## 2017-10-07 DIAGNOSIS — Z9181 History of falling: Secondary | ICD-10-CM | POA: Diagnosis not present

## 2017-10-07 DIAGNOSIS — R2681 Unsteadiness on feet: Secondary | ICD-10-CM | POA: Diagnosis not present

## 2017-10-13 DIAGNOSIS — R2681 Unsteadiness on feet: Secondary | ICD-10-CM | POA: Diagnosis not present

## 2017-10-13 DIAGNOSIS — Z9181 History of falling: Secondary | ICD-10-CM | POA: Diagnosis not present

## 2017-10-15 ENCOUNTER — Ambulatory Visit (INDEPENDENT_AMBULATORY_CARE_PROVIDER_SITE_OTHER): Payer: Medicare Other | Admitting: Licensed Clinical Social Worker

## 2017-10-15 DIAGNOSIS — F3341 Major depressive disorder, recurrent, in partial remission: Secondary | ICD-10-CM

## 2017-10-15 DIAGNOSIS — Z9181 History of falling: Secondary | ICD-10-CM | POA: Diagnosis not present

## 2017-10-15 DIAGNOSIS — R2681 Unsteadiness on feet: Secondary | ICD-10-CM | POA: Diagnosis not present

## 2017-10-16 DIAGNOSIS — Z9181 History of falling: Secondary | ICD-10-CM | POA: Diagnosis not present

## 2017-10-16 DIAGNOSIS — R2681 Unsteadiness on feet: Secondary | ICD-10-CM | POA: Diagnosis not present

## 2017-10-20 ENCOUNTER — Encounter: Payer: Self-pay | Admitting: Neurology

## 2017-10-20 DIAGNOSIS — Z9181 History of falling: Secondary | ICD-10-CM | POA: Diagnosis not present

## 2017-10-20 DIAGNOSIS — R2681 Unsteadiness on feet: Secondary | ICD-10-CM | POA: Diagnosis not present

## 2017-10-21 DIAGNOSIS — Z9181 History of falling: Secondary | ICD-10-CM | POA: Diagnosis not present

## 2017-10-21 DIAGNOSIS — R2681 Unsteadiness on feet: Secondary | ICD-10-CM | POA: Diagnosis not present

## 2017-10-23 DIAGNOSIS — R2681 Unsteadiness on feet: Secondary | ICD-10-CM | POA: Diagnosis not present

## 2017-10-23 DIAGNOSIS — Z9181 History of falling: Secondary | ICD-10-CM | POA: Diagnosis not present

## 2017-10-27 ENCOUNTER — Other Ambulatory Visit: Payer: Self-pay | Admitting: Neurology

## 2017-10-27 ENCOUNTER — Other Ambulatory Visit: Payer: Self-pay | Admitting: Interventional Cardiology

## 2017-11-06 DIAGNOSIS — H401132 Primary open-angle glaucoma, bilateral, moderate stage: Secondary | ICD-10-CM | POA: Diagnosis not present

## 2017-11-12 DIAGNOSIS — M1711 Unilateral primary osteoarthritis, right knee: Secondary | ICD-10-CM | POA: Diagnosis not present

## 2017-11-12 DIAGNOSIS — M1712 Unilateral primary osteoarthritis, left knee: Secondary | ICD-10-CM | POA: Diagnosis not present

## 2017-11-25 ENCOUNTER — Other Ambulatory Visit: Payer: Self-pay | Admitting: Neurology

## 2017-11-25 DIAGNOSIS — M810 Age-related osteoporosis without current pathological fracture: Secondary | ICD-10-CM | POA: Diagnosis not present

## 2017-11-25 DIAGNOSIS — I129 Hypertensive chronic kidney disease with stage 1 through stage 4 chronic kidney disease, or unspecified chronic kidney disease: Secondary | ICD-10-CM | POA: Diagnosis not present

## 2017-11-25 DIAGNOSIS — E785 Hyperlipidemia, unspecified: Secondary | ICD-10-CM | POA: Diagnosis not present

## 2017-11-25 DIAGNOSIS — N183 Chronic kidney disease, stage 3 (moderate): Secondary | ICD-10-CM | POA: Diagnosis not present

## 2017-11-25 DIAGNOSIS — R42 Dizziness and giddiness: Secondary | ICD-10-CM | POA: Diagnosis not present

## 2017-11-26 ENCOUNTER — Ambulatory Visit: Payer: Self-pay | Admitting: Neurology

## 2017-11-26 NOTE — Progress Notes (Signed)
NEUROLOGY FOLLOW UP OFFICE NOTE  [name] [id number]  HISTORY OF PRESENT ILLNESS: [name] is an 84 year old right-handed female with hypertension, CAD, atrial fibrillation, hypercholesterolemia, type 2 diabetes mellitus, migraine, anxiety, sleep apnea and history of stroke with residual right-sided weakness, DVT and PE (on Lovenox) who follows up for mixed Alzheimer's and vascular dementia and right post-herpetic trigeminal neuralgia.  She is accompanied by her daughter who supplements history.  I RIGHT-SIDED POST-HERPETIC TRIGEMINAL NEURALGIA: History: Onset of symptoms started in 2006, following episode of herpes zoster involving she describes sharp pain involving the the right V1 and V2 distribution, associated with allodynia. Chewing, talking and brushing her teeth triggers and aggravates the pain. She was subsequently treated for postherpetic neuralgia.   Past medications: carbamazepine generic which led to breakthrough pain.   Update: Current medication:  Carbatrol 200mg  twice daily, [name] 300mg  twice daily.   Pain is controlled.  II. DEMENTIA: History: Memory deficits started around 2007. It was fairly sudden onset and has not progressed. She tends to repeat herself often, such as telling stories. She repeats questions often. She reports word-finding difficulties as well. She does have difficulty using the phone and managing her finances. She typically lives by herself in her one story house. She manages her own finances. She doesn't drive 2 to remote history of blackout spells. She typically does not have problems recognizing people or recalling names. She denies any depression. She denies any hallucinations. She does have history of vivid nightmares. She reports a maternal aunt with history of dementia. To evaluate cognitive problems, an MRI of the brain was performed on 12/01/13, which revealed extensive small vessel ischemic changes throughout the deep and  subcortical white matter, including remote small infarcts involving the cerebellum and left basal ganglia. Chronic micro-hemorrhages also noted, most notably in the right frontal lobe.  No acute findings noted.  For several years, she has episodes of recurrent speech hesitancy. She knows what she wants to say but has trouble getting the words out. In the past, it has occurred in setting of UTI. To evaluate episodes of transient speech dysfunction, EEG was performed on 03/10/14, which was normal. She went to speech therapy without any real improvement.  Therapist thinks it is likely medication-related.  She underwent neuropsychological testing on 01/12/15.  Testing was consistent with mild dementia.  She exhibited cognitive dysfunction involving the medial-temporal lobe, which is suggestive of Alzheimer's disease.  However, family endorses abrupt onset, which correlates with vascular etiology.  Repeat MRI of the brain performed 02/22/15, stable compared to prior imaging from 12/01/13.  She had repeat neurocognitive testing on 01/02/16.  Cognitive deficits/mild dementia stable compared to one year ago.  Update: She takes Namzaric.  She feels her memory is worse since last year.  She has to stop and think a lot.  She has increased word-finding difficulty.  She manages her medications independently.  Her daughter manages her finances.  Her math is good but had difficulty filling out a check.  She sees a therapist once a month, which is helpful.    PAST MEDICAL HISTORY: Past Medical History:  Diagnosis Date  . A-fib ([place])   . Acid reflux   . Anxiety   . Arthritis   . Chronic anticoagulation 03/07/2013   Lovenox 100 QD  . Chronic kidney disease    Stage III  . CVA (cerebral infarction) 1964  . Dementia    Dr Brett Fairy  . Diverticulosis   . DVT (deep venous thrombosis) (  [place]) 2007   recurrent w PE s/p [place] filter  . GERD (gastroesophageal reflux disease)    w hiatal hernia endoscopy  2003, Dr Penelope Coop  . Glaucoma   . Hypercholesterolemia   . Hypertension   . Hypoglycemia   . Hypothyroidism    goiter  . IBS (irritable bowel syndrome)   . Junctional bradycardia    resolved with discontinuation of sotalol (2014) after 10 yrs  . Memory loss   . Migraine   . Osteoarthritis    in Bilateral knees and back. -Dr Latanya Maudlin  . Osteoporosis   . Panic attacks   . Phlebitis   . Postherpetic trigeminal neuralgia 10/20/2012  . Pulmonary embolism recurrent    IVC filter  . Shingles    zoster 1990 on the back and on the abdomen in 2007,facial zoster 2006  . Trigeminal neuralgia    r face--Dr Dohmeier  . Unspecified cerebral artery occlusion with cerebral infarction   . Vitamin D deficiency     MEDICATIONS: Current Outpatient Medications on File Prior to Visit  Medication Sig Dispense Refill  . amLODipine (NORVASC) 2.5 MG tablet Take 1 tablet (2.5 mg total) by mouth daily. Please make yearly appt with Dr. Tamala Julian for December for future refills. 1st attempt 30 tablet 4  . carbamazepine (CARBATROL) 100 MG 12 hr capsule Take 2 capsules (200 mg total) by mouth 2 (two) times daily. 120 capsule 3  . CARBATROL 100 MG 12 hr capsule TAKE 2 CAPSULES BY MOUTH 2 TIMES DAILY 120 capsule 3  . cholecalciferol (VITAMIN D) 1000 units tablet Take 1,000 Units by mouth daily.    Marland Kitchen enoxaparin (LOVENOX) 100 MG/ML injection Inject 100 mg into the skin at bedtime.     Marland Kitchen EPINEPHrine (EPIPEN IJ) Inject as directed once as needed. For allergic reactions    . Fe Fum-FA-B Cmp-C-Zn-Mg-Mn-Cu (HEMOCYTE PLUS) 106-1 MG CAPS Take 1 capsule by mouth daily.     . fluticasone (FLONASE) 50 MCG/ACT nasal spray Place 1 spray into left nostril daily as needed for allergies or rhinitis.   12  . folic acid (FOLVITE) 1 MG tablet Take 1 mg by mouth every morning.     . Gabapentin, Once-Daily, ([name]) 300 MG TABS Take 300 mg by mouth 2 (two) times daily. 60 tablet 3  . [name] 300 MG TABS Take 300 mg by mouth 3 (three) times  daily.    Marland Kitchen [name] 300 MG TABS TAKE 1 TABLET BY MOUTH EVERY MORNING, 1 TABLET AT NOON, AND AT BEDTIME 270 tablet 1  . [name] 300 MG TABS TAKE 1 TABLET BY MOUTH 2 TIMES DAILY 60 tablet 1  . HYDROcodone-acetaminophen (NORCO/VICODIN) 5-325 MG per tablet Take 1 tablet by mouth every 6 (six) hours as needed for moderate pain.    Marland Kitchen levothyroxine (SYNTHROID, LEVOTHROID) 75 MCG tablet Take 75 mcg by mouth daily before breakfast.     . loratadine (CLARITIN) 10 MG tablet Take 10 mg by mouth daily.    . meclizine (ANTIVERT) 25 MG tablet Take 25 mg by mouth 2 (two) times daily as needed for dizziness or nausea.   1  . Memantine HCl-Donepezil HCl (NAMZARIC) 28-10 MG CP24 Take 1 capsule by mouth daily. 30 capsule 3  . Multiple Vitamin (MULTIVITAMIN) tablet Take 1 tablet by mouth every morning.     Marland Kitchen NAMZARIC 28-10 MG CP24 TAKE 1 CAPSULE BY MOUTH EVERY DAY 30 capsule 6  . pravastatin (PRAVACHOL) 40 MG tablet Take 40 mg by mouth every morning.     Marland Kitchen  RABEprazole (ACIPHEX) 20 MG tablet Take 20 mg by mouth every morning.     . sodium chloride 1 G tablet Take 1 tablet (1 g total) by mouth 2 (two) times daily with a meal. 60 tablet 0  . Travoprost, BAK Free, (TRAVATAN) 0.004 % SOLN ophthalmic solution Place 1 drop into both eyes at bedtime.     No current facility-administered medications on file prior to visit.     ALLERGIES: Allergies  Allergen Reactions  . Benadryl [Diphenhydramine Hcl] Hives and Shortness Of Breath  . Eye Drops [Tetrahydrozoline Hcl] Other (See Comments)    Burning, itching, swelling  . Flu Virus Vaccine Shortness Of Breath  . Iohexol Hives and Shortness Of Breath     Code: HIVES, Desc: PT IS ALLERGIC TO IVP DYE/IODINE/SHRIMP.  CAUSES SOB AND HIVES PER PT.  STEPHANIE, RT-R, Onset Date: [id number]   . Shrimp [Shellfish Allergy] Anaphylaxis  . Voltaren [Diclofenac Sodium] Other (See Comments)    Acid reflux symptoms  . Ambien [Zolpidem Tartrate] Hives  . Gluten Meal Diarrhea  .  Travatan Z [Travoprost] Other (See Comments)    BRAND NAME ONLY OLCANNOT TOLERATE GENERIC FORM OF EYE DROPS, IT CONTAINS PRESERVATIVES-SENSITIVITY  . Ativan [Lorazepam]     delusional  . Hydrocodone     Only on the higher dosages  . Betadine Antibiotic-Moisturize [Bacitracin-Polymyxin B] Rash  . Biaxin [Clarithromycin] Hives  . Celebrex [Celecoxib] Rash  . Ciprofloxacin Hcl Rash       . Combigan [Brimonidine Tartrate-Timolol] Other (See Comments)    unknown  . Coumadin [Warfarin Sodium] Rash  . Ivp Dye [Iodinated Diagnostic Agents] Hives  . Levaquin [Levofloxacin In D5w] Rash  . Naprosyn [Naproxen] Rash  . Pataday [Olopatadine Hcl] Other (See Comments)  . Penicillins Hives    Has patient had a PCN reaction causing immediate rash, facial/tongue/throat swelling, SOB or lightheadedness with hypotension: Yes Has patient had a PCN reaction causing severe rash involving mucus membranes or skin necrosis: No Has patient had a PCN reaction that required hospitalization No Has patient had a PCN reaction occurring within the last 10 years: No If all of the above answers are "NO", then may proceed with Cephalosporin use.   Marland Kitchen Plavix [Clopidogrel Bisulfate] Rash  . Septra [Sulfamethoxazole-Trimethoprim] Hives    FAMILY HISTORY: Family History  Problem Relation Age of Onset  . Diabetes Mother   . Hypertension Mother   . Glaucoma Mother   . CVA Mother   . CAD Mother   . Hypertension Father   . Heart attack Father   . Kidney failure Father   . Heart attack Brother        2 half brothers  . Hypertension Brother   . CVA Brother   . Glaucoma Other   . Glaucoma Other   . Prostate cancer Maternal Grandfather   . Breast cancer Maternal Aunt   . Colon cancer Maternal Uncle   . Rectal cancer Maternal Uncle    SOCIAL HISTORY: Social History   Socioeconomic History  . Marital status: Divorced    Spouse name: Not on file  . Number of children: 3  . Years of education: 74  . Highest  education level: Not on file  Occupational History  . 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 Financial resource strain: Not on file  . Food insecurity:    Worry: Not on file    Inability: Not on file  .  Transportation needs:    Medical: Not on file    Non-medical: Not on file  Tobacco Use  . Smoking status: Never Smoker  . Smokeless tobacco: Never Used  Substance and Sexual Activity  . Alcohol use: No    Comment: very moderate, none  in the last two months  . Drug use: No  . Sexual activity: Never  Lifestyle  . Physical activity:    Days per week: Not on file    Minutes per session: Not on file  . Stress: Not on file  Relationships  . Social connections:    Talks on phone: Not on file    Gets together: Not on file    Attends religious service: Not on file    Active member of club or organization: Not on file    Attends meetings of clubs or organizations: Not on file    Relationship status: Not on file  . Intimate partner violence:    Fear of current or ex partner: Not on file    Emotionally abused: Not on file    Physically abused: Not on file    Forced sexual activity: Not on file  Other Topics Concern  . Not on file  Social History Narrative   Patient is retired and lives at home alone. Patient has a college education.    Patient has three children.   Patient is right-handed.   Patient drinks 1-2 cups of caffeine daily.    REVIEW OF SYSTEMS: Constitutional: No fevers, chills, or sweats, no generalized fatigue, change in appetite Eyes: No visual changes, double vision, eye pain Ear, nose and throat: No hearing loss, ear pain, nasal congestion, sore throat Cardiovascular: No chest pain, palpitations Respiratory:  No shortness of breath at rest or with exertion, wheezes GastrointestinaI: No nausea, vomiting, diarrhea, abdominal pain, fecal incontinence Genitourinary:  No dysuria, urinary retention or  frequency Musculoskeletal:  No neck pain, back pain Integumentary: No rash, pruritus, skin lesions Neurological: as above Psychiatric: No depression, insomnia, anxiety Endocrine: No palpitations, fatigue, diaphoresis, mood swings, change in appetite, change in weight, increased thirst Hematologic/Lymphatic:  No purpura, petechiae. Allergic/Immunologic: no itchy/runny eyes, nasal congestion, recent allergic reactions, rashes  PHYSICAL EXAM: Blood pressure 104/62, pulse 70, height 5' 5.5" (1.664 m), weight 204 lb (92.5 kg), SpO2 98 %. General: No acute distress.  Patient appears well-groomed.   Head:  Normocephalic/atraumatic Eyes:  Fundi examined but not visualized Neck: supple, no paraspinal tenderness, full range of motion Heart:  Regular rate and rhythm Lungs:  Clear to auscultation bilaterally Back: No paraspinal tenderness Neurological Exam: alert and oriented to person, place, and time (except year). Attention span and concentration intact, recent memory poor, remote memory intact, fund of knowledge intact.  Speech fluent and not dysarthric, language intact.   MMSE - Mini Mental State Exam 11/27/2017 11/22/2016  Orientation to time 4 4  Orientation to Place 4 4  Registration 3 3  Attention/ Calculation 5 5  Recall 0 1  Language- name 2 objects 2 2  Language- repeat 1 1  Language- follow 3 step command 3 3  Language- read & follow direction 1 1  Write a sentence 1 1  Copy design 0 1  Total score 24 26   CN II-XII intact. Bulk and tone normal, muscle strength 5/5 throughout.  Sensation to light touch, temperature and vibration intact.  Deep tendon reflexes 2+ throughout, toes downgoing.  Finger to nose and heel to shin testing intact.  Gait normal, Romberg negative.  IMPRESSION: Mixed Alzheimer's and vascular dementia Right-sided postherpetic trigeminal neuralgia  PLAN: 1.  Continue Carbatrol 200mg  twice daily, [name] 300mg  twice daily and Namzaric 2.  Will obtain recent  labs (CBC and CMP) from PCP's office 3.  Follow up in one year  25 minutes spent face to face with patient, over 50% spent discussing diagnosis and management.  Metta Clines, DO  CC: [name], MD

## 2017-11-27 ENCOUNTER — Ambulatory Visit: Payer: Self-pay | Admitting: Neurology

## 2017-11-27 ENCOUNTER — Encounter: Payer: Self-pay | Admitting: Neurology

## 2017-11-27 ENCOUNTER — Ambulatory Visit (INDEPENDENT_AMBULATORY_CARE_PROVIDER_SITE_OTHER): Payer: Medicare Other | Admitting: Neurology

## 2017-11-27 VITALS — BP 104/62 | HR 70 | Ht 65.5 in | Wt 204.0 lb

## 2017-11-27 DIAGNOSIS — B0222 Postherpetic trigeminal neuralgia: Secondary | ICD-10-CM | POA: Diagnosis not present

## 2017-11-27 DIAGNOSIS — F0150 Vascular dementia without behavioral disturbance: Secondary | ICD-10-CM

## 2017-11-27 DIAGNOSIS — F0280 Dementia in other diseases classified elsewhere without behavioral disturbance: Secondary | ICD-10-CM | POA: Diagnosis not present

## 2017-11-27 DIAGNOSIS — G309 Alzheimer's disease, unspecified: Secondary | ICD-10-CM

## 2017-11-27 NOTE — Patient Instructions (Signed)
1.  Continue Carbatrol 200mg  twice daily, [name] 300mg  twice daily and Namzaric daily 2.  Follow up in one year or as needed.

## 2017-11-29 DIAGNOSIS — R42 Dizziness and giddiness: Secondary | ICD-10-CM | POA: Diagnosis not present

## 2017-11-29 DIAGNOSIS — I10 Essential (primary) hypertension: Secondary | ICD-10-CM | POA: Diagnosis not present

## 2017-11-29 DIAGNOSIS — W19XXXA Unspecified fall, initial encounter: Secondary | ICD-10-CM | POA: Diagnosis not present

## 2017-12-02 ENCOUNTER — Ambulatory Visit (INDEPENDENT_AMBULATORY_CARE_PROVIDER_SITE_OTHER): Payer: Medicare Other | Admitting: Licensed Clinical Social Worker

## 2017-12-02 DIAGNOSIS — F3341 Major depressive disorder, recurrent, in partial remission: Secondary | ICD-10-CM | POA: Diagnosis not present

## 2017-12-16 ENCOUNTER — Ambulatory Visit (INDEPENDENT_AMBULATORY_CARE_PROVIDER_SITE_OTHER): Payer: Medicare Other | Admitting: Podiatry

## 2017-12-16 ENCOUNTER — Encounter: Payer: Self-pay | Admitting: Podiatry

## 2017-12-16 DIAGNOSIS — M79674 Pain in right toe(s): Secondary | ICD-10-CM | POA: Diagnosis not present

## 2017-12-16 DIAGNOSIS — M722 Plantar fascial fibromatosis: Secondary | ICD-10-CM

## 2017-12-16 DIAGNOSIS — M79675 Pain in left toe(s): Secondary | ICD-10-CM

## 2017-12-16 DIAGNOSIS — B351 Tinea unguium: Secondary | ICD-10-CM | POA: Diagnosis not present

## 2017-12-16 DIAGNOSIS — D689 Coagulation defect, unspecified: Secondary | ICD-10-CM | POA: Diagnosis not present

## 2017-12-16 NOTE — Patient Instructions (Signed)
Look at getting BioFreeze to apply to the heels.

## 2017-12-17 NOTE — Progress Notes (Signed)
Subjective: 84 y.o. returns the office today for painful, elongated, thickened toenails which she cannot trim herself. Denies any redness or drainage around the nails.  She also gets some pain to the left heel at times.  She has pain to the heel at nighttime or with walking in the mornings when she first gets up.  The pain is intermittent.  No swelling.  No recent injury or trauma.  She is using Aspercreme with lidocaine without significant improvement.  Denies any acute changes since last appointment and no new complaints today. Denies any systemic complaints such as fevers, chills, nausea, vomiting.   PCP: [name], MD  Objective: AAO 3, NAD DP/PT pulses palpable, CRT less than 3 seconds Nails hypertrophic, dystrophic, elongated, brittle, discolored 10. There is tenderness overlying the nails 1-5 bilaterally. There is no surrounding erythema or drainage along the nail sites. At this time there is no area tenderness however suggest with there is tenderness on the plantar aspect of the calcaneus along insertion of the plantar fascia into the calcaneus and there is prominence of the peroneal tubercle plantarly.  There is no edema, erythema.  Achilles tendon appears to be intact. No open lesions or pre-ulcerative lesions are identified. No other areas of tenderness bilateral lower extremities. No overlying edema, erythema, increased warmth. No pain with calf compression, swelling, warmth, erythema.  Assessment: Patient presents with symptomatic onychomycosis  Plan: -Treatment options including alternatives, risks, complications were discussed -Nails sharply debrided 10 without complication/bleeding. -Regard to the heel pain discussed applying Biofreeze.  Discussed stretching, icing exercises daily.  Discussed shoe modifications and inserts asked dispensed a gel heel cup. -Discussed daily foot inspection. If there are any changes, to call the office immediately.  -Follow-up in 3 months or  sooner if any problems are to arise. In the meantime, encouraged to call the office with any questions, concerns, changes symptoms.  Celesta Gentile, DPM

## 2017-12-18 ENCOUNTER — Ambulatory Visit: Payer: Medicare Other | Admitting: Podiatry

## 2017-12-23 ENCOUNTER — Other Ambulatory Visit: Payer: Self-pay | Admitting: Neurology

## 2017-12-30 ENCOUNTER — Ambulatory Visit (INDEPENDENT_AMBULATORY_CARE_PROVIDER_SITE_OTHER): Payer: Medicare Other | Admitting: Licensed Clinical Social Worker

## 2017-12-30 DIAGNOSIS — F3341 Major depressive disorder, recurrent, in partial remission: Secondary | ICD-10-CM

## 2018-01-20 DIAGNOSIS — M810 Age-related osteoporosis without current pathological fracture: Secondary | ICD-10-CM | POA: Diagnosis not present

## 2018-01-22 ENCOUNTER — Other Ambulatory Visit: Payer: Self-pay | Admitting: Neurology

## 2018-01-26 ENCOUNTER — Telehealth: Payer: Self-pay

## 2018-01-26 NOTE — Telephone Encounter (Signed)
Rcvd VM message from [place] at [facility] [phone] concerning Pt's Carbatrol. They rcvd a refill authorization, normally it is DAW1, they wanted to clarify it was ok to fill DAW1. I called her back advised her that was fine

## 2018-01-27 DIAGNOSIS — R2681 Unsteadiness on feet: Secondary | ICD-10-CM | POA: Diagnosis not present

## 2018-01-27 DIAGNOSIS — R262 Difficulty in walking, not elsewhere classified: Secondary | ICD-10-CM | POA: Diagnosis not present

## 2018-01-28 DIAGNOSIS — R262 Difficulty in walking, not elsewhere classified: Secondary | ICD-10-CM | POA: Diagnosis not present

## 2018-01-28 DIAGNOSIS — R2681 Unsteadiness on feet: Secondary | ICD-10-CM | POA: Diagnosis not present

## 2018-01-30 DIAGNOSIS — R262 Difficulty in walking, not elsewhere classified: Secondary | ICD-10-CM | POA: Diagnosis not present

## 2018-01-30 DIAGNOSIS — R2681 Unsteadiness on feet: Secondary | ICD-10-CM | POA: Diagnosis not present

## 2018-02-04 ENCOUNTER — Ambulatory Visit: Payer: Medicare Other | Admitting: Licensed Clinical Social Worker

## 2018-02-04 DIAGNOSIS — R2681 Unsteadiness on feet: Secondary | ICD-10-CM | POA: Diagnosis not present

## 2018-02-04 DIAGNOSIS — R262 Difficulty in walking, not elsewhere classified: Secondary | ICD-10-CM | POA: Diagnosis not present

## 2018-02-05 ENCOUNTER — Ambulatory Visit (INDEPENDENT_AMBULATORY_CARE_PROVIDER_SITE_OTHER): Payer: Medicare Other | Admitting: Licensed Clinical Social Worker

## 2018-02-05 DIAGNOSIS — F3341 Major depressive disorder, recurrent, in partial remission: Secondary | ICD-10-CM | POA: Diagnosis not present

## 2018-02-06 DIAGNOSIS — R2681 Unsteadiness on feet: Secondary | ICD-10-CM | POA: Diagnosis not present

## 2018-02-06 DIAGNOSIS — R262 Difficulty in walking, not elsewhere classified: Secondary | ICD-10-CM | POA: Diagnosis not present

## 2018-02-10 DIAGNOSIS — R2681 Unsteadiness on feet: Secondary | ICD-10-CM | POA: Diagnosis not present

## 2018-02-10 DIAGNOSIS — R262 Difficulty in walking, not elsewhere classified: Secondary | ICD-10-CM | POA: Diagnosis not present

## 2018-02-11 DIAGNOSIS — R2681 Unsteadiness on feet: Secondary | ICD-10-CM | POA: Diagnosis not present

## 2018-02-11 DIAGNOSIS — R262 Difficulty in walking, not elsewhere classified: Secondary | ICD-10-CM | POA: Diagnosis not present

## 2018-02-12 DIAGNOSIS — R262 Difficulty in walking, not elsewhere classified: Secondary | ICD-10-CM | POA: Diagnosis not present

## 2018-02-12 DIAGNOSIS — R2681 Unsteadiness on feet: Secondary | ICD-10-CM | POA: Diagnosis not present

## 2018-02-16 DIAGNOSIS — R2681 Unsteadiness on feet: Secondary | ICD-10-CM | POA: Diagnosis not present

## 2018-02-16 DIAGNOSIS — R262 Difficulty in walking, not elsewhere classified: Secondary | ICD-10-CM | POA: Diagnosis not present

## 2018-02-17 NOTE — Telephone Encounter (Signed)
done

## 2018-02-26 DIAGNOSIS — E785 Hyperlipidemia, unspecified: Secondary | ICD-10-CM | POA: Diagnosis not present

## 2018-02-26 DIAGNOSIS — M810 Age-related osteoporosis without current pathological fracture: Secondary | ICD-10-CM | POA: Diagnosis not present

## 2018-02-26 DIAGNOSIS — R58 Hemorrhage, not elsewhere classified: Secondary | ICD-10-CM | POA: Diagnosis not present

## 2018-02-26 DIAGNOSIS — N183 Chronic kidney disease, stage 3 (moderate): Secondary | ICD-10-CM | POA: Diagnosis not present

## 2018-02-26 DIAGNOSIS — R21 Rash and other nonspecific skin eruption: Secondary | ICD-10-CM | POA: Diagnosis not present

## 2018-02-26 DIAGNOSIS — R197 Diarrhea, unspecified: Secondary | ICD-10-CM | POA: Diagnosis not present

## 2018-03-04 DIAGNOSIS — R300 Dysuria: Secondary | ICD-10-CM | POA: Diagnosis not present

## 2018-03-10 DIAGNOSIS — R262 Difficulty in walking, not elsewhere classified: Secondary | ICD-10-CM | POA: Diagnosis not present

## 2018-03-10 DIAGNOSIS — R2681 Unsteadiness on feet: Secondary | ICD-10-CM | POA: Diagnosis not present

## 2018-03-12 DIAGNOSIS — R262 Difficulty in walking, not elsewhere classified: Secondary | ICD-10-CM | POA: Diagnosis not present

## 2018-03-12 DIAGNOSIS — R2681 Unsteadiness on feet: Secondary | ICD-10-CM | POA: Diagnosis not present

## 2018-03-13 DIAGNOSIS — R2681 Unsteadiness on feet: Secondary | ICD-10-CM | POA: Diagnosis not present

## 2018-03-13 DIAGNOSIS — R262 Difficulty in walking, not elsewhere classified: Secondary | ICD-10-CM | POA: Diagnosis not present

## 2018-03-16 DIAGNOSIS — R2681 Unsteadiness on feet: Secondary | ICD-10-CM | POA: Diagnosis not present

## 2018-03-16 DIAGNOSIS — R262 Difficulty in walking, not elsewhere classified: Secondary | ICD-10-CM | POA: Diagnosis not present

## 2018-03-17 DIAGNOSIS — R262 Difficulty in walking, not elsewhere classified: Secondary | ICD-10-CM | POA: Diagnosis not present

## 2018-03-17 DIAGNOSIS — R2681 Unsteadiness on feet: Secondary | ICD-10-CM | POA: Diagnosis not present

## 2018-03-20 ENCOUNTER — Ambulatory Visit (INDEPENDENT_AMBULATORY_CARE_PROVIDER_SITE_OTHER): Payer: Medicare Other | Admitting: Podiatry

## 2018-03-20 ENCOUNTER — Ambulatory Visit: Payer: Medicare Other | Admitting: Podiatry

## 2018-03-20 ENCOUNTER — Other Ambulatory Visit: Payer: Self-pay | Admitting: Interventional Cardiology

## 2018-03-20 DIAGNOSIS — D689 Coagulation defect, unspecified: Secondary | ICD-10-CM | POA: Diagnosis not present

## 2018-03-20 DIAGNOSIS — B351 Tinea unguium: Secondary | ICD-10-CM

## 2018-03-20 DIAGNOSIS — M79674 Pain in right toe(s): Secondary | ICD-10-CM | POA: Diagnosis not present

## 2018-03-20 DIAGNOSIS — L84 Corns and callosities: Secondary | ICD-10-CM

## 2018-03-20 DIAGNOSIS — M79675 Pain in left toe(s): Secondary | ICD-10-CM

## 2018-03-20 NOTE — Patient Instructions (Signed)

## 2018-03-23 DIAGNOSIS — R2681 Unsteadiness on feet: Secondary | ICD-10-CM | POA: Diagnosis not present

## 2018-03-23 DIAGNOSIS — R262 Difficulty in walking, not elsewhere classified: Secondary | ICD-10-CM | POA: Diagnosis not present

## 2018-03-26 ENCOUNTER — Telehealth: Payer: Self-pay | Admitting: Neurology

## 2018-03-26 DIAGNOSIS — R262 Difficulty in walking, not elsewhere classified: Secondary | ICD-10-CM | POA: Diagnosis not present

## 2018-03-26 DIAGNOSIS — R2681 Unsteadiness on feet: Secondary | ICD-10-CM | POA: Diagnosis not present

## 2018-03-26 NOTE — Telephone Encounter (Signed)
Patient's daughter called regarding her [name] Medication and [employer] not wanting to pay? She mentioned a Generic for Gabapentin? She uses [facility]. Please Call. Thank you

## 2018-03-26 NOTE — Telephone Encounter (Signed)
[name] left a vm that she was returning your call. Please call her back

## 2018-03-26 NOTE — Telephone Encounter (Signed)
Called and LMOVM for [name] to return my call.

## 2018-03-27 ENCOUNTER — Ambulatory Visit (INDEPENDENT_AMBULATORY_CARE_PROVIDER_SITE_OTHER): Payer: Medicare Other | Admitting: Licensed Clinical Social Worker

## 2018-03-27 DIAGNOSIS — R2681 Unsteadiness on feet: Secondary | ICD-10-CM | POA: Diagnosis not present

## 2018-03-27 DIAGNOSIS — F3341 Major depressive disorder, recurrent, in partial remission: Secondary | ICD-10-CM | POA: Diagnosis not present

## 2018-03-27 DIAGNOSIS — R262 Difficulty in walking, not elsewhere classified: Secondary | ICD-10-CM | POA: Diagnosis not present

## 2018-03-30 DIAGNOSIS — R262 Difficulty in walking, not elsewhere classified: Secondary | ICD-10-CM | POA: Diagnosis not present

## 2018-03-30 DIAGNOSIS — R2681 Unsteadiness on feet: Secondary | ICD-10-CM | POA: Diagnosis not present

## 2018-03-30 NOTE — Telephone Encounter (Signed)
Called and LMOVM for [name] to call me back

## 2018-03-31 ENCOUNTER — Telehealth: Payer: Self-pay | Admitting: Neurology

## 2018-03-31 MED ORDER — GABAPENTIN 300 MG PO CAPS: 300.0000 mg | ORAL_CAPSULE | Freq: Two times a day (BID) | ORAL | 5 refills | Status: DC

## 2018-03-31 NOTE — Telephone Encounter (Signed)
Shannon from the Pharm left vm about medication conflict please call her back at [phone]. Thanks!

## 2018-03-31 NOTE — Addendum Note (Signed)
Addended by: Clois Comber on: 03/31/2018 01:40 PM   Modules accepted: Orders

## 2018-03-31 NOTE — Telephone Encounter (Signed)
Called and spoke with [name]. Pt's Ins. will no longer cover [name] after the first of the year. They would like a new Rx for IR gabapentin 300 mg BID. Pt just had Rx filled in December, not an emergency.

## 2018-03-31 NOTE — Telephone Encounter (Signed)
Called and spoke with [name] was on break. I advised her [name] has been d/c due to insurance.

## 2018-03-31 NOTE — Telephone Encounter (Signed)
Ok, thanks.

## 2018-04-01 DIAGNOSIS — R262 Difficulty in walking, not elsewhere classified: Secondary | ICD-10-CM | POA: Diagnosis not present

## 2018-04-01 DIAGNOSIS — R2681 Unsteadiness on feet: Secondary | ICD-10-CM | POA: Diagnosis not present

## 2018-04-03 DIAGNOSIS — R2681 Unsteadiness on feet: Secondary | ICD-10-CM | POA: Diagnosis not present

## 2018-04-03 DIAGNOSIS — R262 Difficulty in walking, not elsewhere classified: Secondary | ICD-10-CM | POA: Diagnosis not present

## 2018-04-06 DIAGNOSIS — R262 Difficulty in walking, not elsewhere classified: Secondary | ICD-10-CM | POA: Diagnosis not present

## 2018-04-06 DIAGNOSIS — R2681 Unsteadiness on feet: Secondary | ICD-10-CM | POA: Diagnosis not present

## 2018-04-08 DIAGNOSIS — R262 Difficulty in walking, not elsewhere classified: Secondary | ICD-10-CM | POA: Diagnosis not present

## 2018-04-08 DIAGNOSIS — R2681 Unsteadiness on feet: Secondary | ICD-10-CM | POA: Diagnosis not present

## 2018-04-10 DIAGNOSIS — R2681 Unsteadiness on feet: Secondary | ICD-10-CM | POA: Diagnosis not present

## 2018-04-10 DIAGNOSIS — R262 Difficulty in walking, not elsewhere classified: Secondary | ICD-10-CM | POA: Diagnosis not present

## 2018-04-13 ENCOUNTER — Ambulatory Visit (INDEPENDENT_AMBULATORY_CARE_PROVIDER_SITE_OTHER): Payer: Medicare Other | Admitting: Licensed Clinical Social Worker

## 2018-04-13 DIAGNOSIS — F3341 Major depressive disorder, recurrent, in partial remission: Secondary | ICD-10-CM

## 2018-04-14 DIAGNOSIS — R2681 Unsteadiness on feet: Secondary | ICD-10-CM | POA: Diagnosis not present

## 2018-04-14 DIAGNOSIS — R262 Difficulty in walking, not elsewhere classified: Secondary | ICD-10-CM | POA: Diagnosis not present

## 2018-04-15 DIAGNOSIS — R262 Difficulty in walking, not elsewhere classified: Secondary | ICD-10-CM | POA: Diagnosis not present

## 2018-04-15 DIAGNOSIS — R2681 Unsteadiness on feet: Secondary | ICD-10-CM | POA: Diagnosis not present

## 2018-04-17 DIAGNOSIS — R262 Difficulty in walking, not elsewhere classified: Secondary | ICD-10-CM | POA: Diagnosis not present

## 2018-04-17 DIAGNOSIS — R2681 Unsteadiness on feet: Secondary | ICD-10-CM | POA: Diagnosis not present

## 2018-04-18 ENCOUNTER — Encounter: Payer: Self-pay | Admitting: Podiatry

## 2018-04-18 NOTE — Progress Notes (Signed)
Subjective:  Patient presents to clinic with cc of  painful, thick, discolored, elongated toenails 1-5 b/l that become tender and cannot cut because of thickness.  Denise Stains, MD is her PCP.   Current Outpatient Medications:  .  carbamazepine (CARBATROL) 100 MG 12 hr capsule, Take 2 capsules (200 mg total) by mouth 2 (two) times daily., Disp: 120 capsule, Rfl: 3 .  CARBATROL 100 MG 12 hr capsule, TAKE 2 CAPSULES BY MOUTH 2 TIMES DAILY, Disp: 120 capsule, Rfl: 5 .  cholecalciferol (VITAMIN D) 1000 units tablet, Take 1,000 Units by mouth daily., Disp: , Rfl:  .  enoxaparin (LOVENOX) 100 MG/ML injection, Inject 100 mg into the skin at bedtime. , Disp: , Rfl:  .  EPINEPHrine (EPIPEN IJ), Inject as directed once as needed. For allergic reactions, Disp: , Rfl:  .  Fe Fum-FA-B Cmp-C-Zn-Mg-Mn-Cu (HEMOCYTE PLUS) 106-1 MG CAPS, Take 1 capsule by mouth daily. , Disp: , Rfl:  .  fluticasone (FLONASE) 50 MCG/ACT nasal spray, Place 1 spray into left nostril daily as needed for allergies or rhinitis. , Disp: , Rfl: 12 .  folic acid (FOLVITE) 1 MG tablet, Take 1 mg by mouth every morning. , Disp: , Rfl:  .  Gabapentin, Once-Daily, ([name]) 300 MG TABS, Take 300 mg by mouth 2 (two) times daily., Disp: 60 tablet, Rfl: 3 .  [name] 300 MG TABS, Take 300 mg by mouth 3 (three) times daily., Disp: , Rfl:  .  [name] 300 MG TABS, TAKE 1 TABLET BY MOUTH EVERY MORNING, 1 TABLET AT NOON, AND AT BEDTIME, Disp: 270 tablet, Rfl: 1 .  [name] 300 MG TABS, TAKE 1 TABLET BY MOUTH 2 TIMES DAILY, Disp: 60 tablet, Rfl: 5 .  HYDROcodone-acetaminophen (NORCO/VICODIN) 5-325 MG per tablet, Take 1 tablet by mouth every 6 (six) hours as needed for moderate pain., Disp: , Rfl:  .  levothyroxine (SYNTHROID, LEVOTHROID) 75 MCG tablet, Take 75 mcg by mouth daily before breakfast. , Disp: , Rfl:  .  loratadine (CLARITIN) 10 MG tablet, Take 10 mg by mouth daily., Disp: , Rfl:  .  meclizine (ANTIVERT) 25 MG tablet, Take 25 mg by mouth  2 (two) times daily as needed for dizziness or nausea. , Disp: , Rfl: 1 .  Multiple Vitamin (MULTIVITAMIN) tablet, Take 1 tablet by mouth every morning. , Disp: , Rfl:  .  NAMZARIC 28-10 MG CP24, TAKE 1 CAPSULE BY MOUTH EVERY DAY, Disp: 30 capsule, Rfl: 6 .  NAMZARIC 28-10 MG CP24, TAKE 1 CAPSULE BY MOUTH EVERY DAY, Disp: 30 capsule, Rfl: 5 .  pravastatin (PRAVACHOL) 40 MG tablet, Take 40 mg by mouth every morning. , Disp: , Rfl:  .  RABEprazole (ACIPHEX) 20 MG tablet, Take 20 mg by mouth every morning. , Disp: , Rfl:  .  sodium chloride 1 G tablet, Take 1 tablet (1 g total) by mouth 2 (two) times daily with a meal., Disp: 60 tablet, Rfl: 0 .  Travoprost, BAK Free, (TRAVATAN) 0.004 % SOLN ophthalmic solution, Place 1 drop into both eyes at bedtime., Disp: , Rfl:  .  amLODipine (NORVASC) 2.5 MG tablet, Take 1 tablet (2.5 mg total) by mouth daily. Please make overdue appt for future refills. [phone]. 1st attempt, Disp: 30 tablet, Rfl: 0 .  gabapentin (NEURONTIN) 300 MG capsule, Take 1 capsule (300 mg total) by mouth 2 (two) times daily., Disp: 60 capsule, Rfl: 5   Allergies  Allergen Reactions  . Benadryl [Diphenhydramine Hcl] Hives and Shortness Of Breath  .  Eye Drops [Tetrahydrozoline Hcl] Other (See Comments)    Burning, itching, swelling  . Flu Virus Vaccine Shortness Of Breath  . Iohexol Hives and Shortness Of Breath     Code: HIVES, Desc: PT IS ALLERGIC TO IVP DYE/IODINE/SHRIMP.  CAUSES SOB AND HIVES PER PT.  [name], RT-R, Onset Date: [id number]   . Shrimp [Shellfish Allergy] Anaphylaxis  . Voltaren [Diclofenac Sodium] Other (See Comments)    Acid reflux symptoms  . Ambien [Zolpidem Tartrate] Hives  . Gluten Meal Diarrhea  . Travatan Z [Travoprost] Other (See Comments)    BRAND NAME ONLY OLCANNOT TOLERATE GENERIC FORM OF EYE DROPS, IT CONTAINS PRESERVATIVES-SENSITIVITY  . Ativan [Lorazepam]     delusional  . Hydrocodone     Only on the higher dosages  . Betadine  Antibiotic-Moisturize [Bacitracin-Polymyxin B] Rash  . Biaxin [Clarithromycin] Hives  . Celebrex [Celecoxib] Rash  . Ciprofloxacin Hcl Rash       . Combigan [Brimonidine Tartrate-Timolol] Other (See Comments)    unknown  . Coumadin [Warfarin Sodium] Rash  . Ivp Dye [Iodinated Diagnostic Agents] Hives  . Levaquin [Levofloxacin In D5w] Rash  . Naprosyn [Naproxen] Rash  . Pataday [Olopatadine Hcl] Other (See Comments)  . Penicillins Hives    Has patient had a PCN reaction causing immediate rash, facial/tongue/throat swelling, SOB or lightheadedness with hypotension: Yes Has patient had a PCN reaction causing severe rash involving mucus membranes or skin necrosis: No Has patient had a PCN reaction that required hospitalization No Has patient had a PCN reaction occurring within the last 10 years: No If all of the above answers are "NO", then may proceed with Cephalosporin use.   Marland Kitchen Plavix [Clopidogrel Bisulfate] Rash  . Septra [Sulfamethoxazole-Trimethoprim] Hives     Objective:  Physical Examination: Neurovascular status intact with thick, discolored brittle toenails 1-5 b/l  Hyperkeratotic lesion submetatarsal head 1 right foot  Assessment: Mycotic nail infection with pain 1-5 b/l Callus submetatarsal head 1 right foot Clotting disorder on long-term blood thinner  Plan: Debride painful toenails 1-5 b/l with no iatrogenic bleeding. Callus debrided submetatarsal head 1 right foot Continue soft supportive shoe gear daily. Report any pedal injuries to medical professional immediately. Follow-up 3 months

## 2018-04-21 DIAGNOSIS — R2681 Unsteadiness on feet: Secondary | ICD-10-CM | POA: Diagnosis not present

## 2018-04-21 DIAGNOSIS — R262 Difficulty in walking, not elsewhere classified: Secondary | ICD-10-CM | POA: Diagnosis not present

## 2018-04-22 DIAGNOSIS — R262 Difficulty in walking, not elsewhere classified: Secondary | ICD-10-CM | POA: Diagnosis not present

## 2018-04-22 DIAGNOSIS — R2681 Unsteadiness on feet: Secondary | ICD-10-CM | POA: Diagnosis not present

## 2018-04-27 DIAGNOSIS — H524 Presbyopia: Secondary | ICD-10-CM | POA: Diagnosis not present

## 2018-04-27 DIAGNOSIS — H401132 Primary open-angle glaucoma, bilateral, moderate stage: Secondary | ICD-10-CM | POA: Diagnosis not present

## 2018-04-28 DIAGNOSIS — M170 Bilateral primary osteoarthritis of knee: Secondary | ICD-10-CM | POA: Diagnosis not present

## 2018-04-28 DIAGNOSIS — M25561 Pain in right knee: Secondary | ICD-10-CM | POA: Diagnosis not present

## 2018-04-28 DIAGNOSIS — R262 Difficulty in walking, not elsewhere classified: Secondary | ICD-10-CM | POA: Diagnosis not present

## 2018-04-28 DIAGNOSIS — R2681 Unsteadiness on feet: Secondary | ICD-10-CM | POA: Diagnosis not present

## 2018-05-01 DIAGNOSIS — R2681 Unsteadiness on feet: Secondary | ICD-10-CM | POA: Diagnosis not present

## 2018-05-01 DIAGNOSIS — R262 Difficulty in walking, not elsewhere classified: Secondary | ICD-10-CM | POA: Diagnosis not present

## 2018-05-04 DIAGNOSIS — R262 Difficulty in walking, not elsewhere classified: Secondary | ICD-10-CM | POA: Diagnosis not present

## 2018-05-04 DIAGNOSIS — R2681 Unsteadiness on feet: Secondary | ICD-10-CM | POA: Diagnosis not present

## 2018-05-05 DIAGNOSIS — R262 Difficulty in walking, not elsewhere classified: Secondary | ICD-10-CM | POA: Diagnosis not present

## 2018-05-05 DIAGNOSIS — R2681 Unsteadiness on feet: Secondary | ICD-10-CM | POA: Diagnosis not present

## 2018-05-06 ENCOUNTER — Ambulatory Visit (INDEPENDENT_AMBULATORY_CARE_PROVIDER_SITE_OTHER): Payer: Medicare Other | Admitting: Licensed Clinical Social Worker

## 2018-05-06 DIAGNOSIS — F3341 Major depressive disorder, recurrent, in partial remission: Secondary | ICD-10-CM

## 2018-05-20 ENCOUNTER — Other Ambulatory Visit: Payer: Self-pay | Admitting: Interventional Cardiology

## 2018-05-20 ENCOUNTER — Other Ambulatory Visit: Payer: Self-pay | Admitting: Neurology

## 2018-06-01 DIAGNOSIS — D638 Anemia in other chronic diseases classified elsewhere: Secondary | ICD-10-CM | POA: Diagnosis not present

## 2018-06-01 DIAGNOSIS — M25571 Pain in right ankle and joints of right foot: Secondary | ICD-10-CM | POA: Diagnosis not present

## 2018-06-01 DIAGNOSIS — F0390 Unspecified dementia without behavioral disturbance: Secondary | ICD-10-CM | POA: Diagnosis not present

## 2018-06-01 DIAGNOSIS — N183 Chronic kidney disease, stage 3 (moderate): Secondary | ICD-10-CM | POA: Diagnosis not present

## 2018-06-01 DIAGNOSIS — M25572 Pain in left ankle and joints of left foot: Secondary | ICD-10-CM | POA: Diagnosis not present

## 2018-06-01 DIAGNOSIS — F324 Major depressive disorder, single episode, in partial remission: Secondary | ICD-10-CM | POA: Diagnosis not present

## 2018-06-01 DIAGNOSIS — E785 Hyperlipidemia, unspecified: Secondary | ICD-10-CM | POA: Diagnosis not present

## 2018-06-01 DIAGNOSIS — I129 Hypertensive chronic kidney disease with stage 1 through stage 4 chronic kidney disease, or unspecified chronic kidney disease: Secondary | ICD-10-CM | POA: Diagnosis not present

## 2018-06-01 DIAGNOSIS — E559 Vitamin D deficiency, unspecified: Secondary | ICD-10-CM | POA: Diagnosis not present

## 2018-06-04 ENCOUNTER — Ambulatory Visit: Payer: Medicare Other | Admitting: Licensed Clinical Social Worker

## 2018-06-18 ENCOUNTER — Ambulatory Visit: Payer: Medicare Other | Admitting: Podiatry

## 2018-07-07 ENCOUNTER — Ambulatory Visit (INDEPENDENT_AMBULATORY_CARE_PROVIDER_SITE_OTHER): Payer: Medicare Other | Admitting: Licensed Clinical Social Worker

## 2018-07-07 DIAGNOSIS — F3341 Major depressive disorder, recurrent, in partial remission: Secondary | ICD-10-CM

## 2018-07-08 ENCOUNTER — Other Ambulatory Visit: Payer: Self-pay | Admitting: Neurology

## 2018-07-30 ENCOUNTER — Ambulatory Visit: Payer: Medicare Other | Admitting: Podiatry

## 2018-09-02 DIAGNOSIS — I480 Paroxysmal atrial fibrillation: Secondary | ICD-10-CM | POA: Diagnosis not present

## 2018-09-02 DIAGNOSIS — R350 Frequency of micturition: Secondary | ICD-10-CM | POA: Diagnosis not present

## 2018-09-02 DIAGNOSIS — E039 Hypothyroidism, unspecified: Secondary | ICD-10-CM | POA: Diagnosis not present

## 2018-09-02 DIAGNOSIS — M25551 Pain in right hip: Secondary | ICD-10-CM | POA: Diagnosis not present

## 2018-09-02 DIAGNOSIS — R479 Unspecified speech disturbances: Secondary | ICD-10-CM | POA: Diagnosis not present

## 2018-09-02 DIAGNOSIS — D638 Anemia in other chronic diseases classified elsewhere: Secondary | ICD-10-CM | POA: Diagnosis not present

## 2018-09-02 DIAGNOSIS — N183 Chronic kidney disease, stage 3 (moderate): Secondary | ICD-10-CM | POA: Diagnosis not present

## 2018-09-02 DIAGNOSIS — E785 Hyperlipidemia, unspecified: Secondary | ICD-10-CM | POA: Diagnosis not present

## 2018-09-02 DIAGNOSIS — I129 Hypertensive chronic kidney disease with stage 1 through stage 4 chronic kidney disease, or unspecified chronic kidney disease: Secondary | ICD-10-CM | POA: Diagnosis not present

## 2018-09-03 DIAGNOSIS — R350 Frequency of micturition: Secondary | ICD-10-CM | POA: Diagnosis not present

## 2018-09-07 DIAGNOSIS — H401133 Primary open-angle glaucoma, bilateral, severe stage: Secondary | ICD-10-CM | POA: Diagnosis not present

## 2018-09-21 ENCOUNTER — Encounter: Payer: Self-pay | Admitting: Podiatry

## 2018-09-21 ENCOUNTER — Ambulatory Visit (INDEPENDENT_AMBULATORY_CARE_PROVIDER_SITE_OTHER): Payer: Medicare Other | Admitting: Podiatry

## 2018-09-21 ENCOUNTER — Other Ambulatory Visit: Payer: Self-pay

## 2018-09-21 VITALS — Temp 98.2°F

## 2018-09-21 DIAGNOSIS — B351 Tinea unguium: Secondary | ICD-10-CM

## 2018-09-21 DIAGNOSIS — M79674 Pain in right toe(s): Secondary | ICD-10-CM | POA: Diagnosis not present

## 2018-09-21 DIAGNOSIS — L84 Corns and callosities: Secondary | ICD-10-CM | POA: Diagnosis not present

## 2018-09-21 DIAGNOSIS — M79675 Pain in left toe(s): Secondary | ICD-10-CM

## 2018-09-21 DIAGNOSIS — D689 Coagulation defect, unspecified: Secondary | ICD-10-CM

## 2018-09-21 NOTE — Patient Instructions (Signed)

## 2018-09-26 NOTE — Progress Notes (Signed)
Subjective: [name] presents to clinic with cc of painful mycotic toenails and callus which are aggravated when weightbearing with and without shoe gear.  This pain limits her daily activities. Pain symptoms resolve with periodic professional debridement.  [name], MD is her PCP. Last visit was 06/01/2018.  Daughter is present during today's visit.    Current Outpatient Medications:  .  amLODipine (NORVASC) 2.5 MG tablet, Take 1 tablet (2.5 mg total) by mouth daily. Please make overdue appt for future refills. [phone]. 2nd attempt, Disp: 15 tablet, Rfl: 0 .  carbamazepine (CARBATROL) 100 MG 12 hr capsule, Take 2 capsules (200 mg total) by mouth 2 (two) times daily., Disp: 120 capsule, Rfl: 3 .  CARBATROL 100 MG 12 hr capsule, TAKE 2 CAPSULES BY MOUTH 2 TIMES DAILY, Disp: 120 capsule, Rfl: 5 .  cholecalciferol (VITAMIN D) 1000 units tablet, Take 1,000 Units by mouth daily., Disp: , Rfl:  .  clindamycin (CLEOCIN) 150 MG capsule, TAKE 4 CAPSULES BY MOUTH 1 HOUR PRIOR TO DENTAL APPOINTMENT, Disp: , Rfl:  .  donepezil (ARICEPT) 10 MG tablet, Take 10 mg by mouth at bedtime., Disp: , Rfl:  .  enoxaparin (LOVENOX) 100 MG/ML injection, Inject 100 mg into the skin at bedtime. , Disp: , Rfl:  .  EPINEPHrine (EPIPEN IJ), Inject as directed once as needed. For allergic reactions, Disp: , Rfl:  .  Fe Fum-FA-B Cmp-C-Zn-Mg-Mn-Cu (HEMOCYTE PLUS) 106-1 MG CAPS, Take 1 capsule by mouth daily. , Disp: , Rfl:  .  fluticasone (FLONASE) 50 MCG/ACT nasal spray, Place 1 spray into left nostril daily as needed for allergies or rhinitis. , Disp: , Rfl: 12 .  folic acid (FOLVITE) 1 MG tablet, Take 1 mg by mouth every morning. , Disp: , Rfl:  .  gabapentin (NEURONTIN) 300 MG capsule, Take 1 capsule (300 mg total) by mouth 2 (two) times daily., Disp: 60 capsule, Rfl: 5 .  Gabapentin, Once-Daily, ([name]) 300 MG TABS, Take 300 mg by mouth 2 (two) times daily., Disp: 60 tablet, Rfl: 3 .  [name] 300 MG  TABS, Take 300 mg by mouth 3 (three) times daily., Disp: , Rfl:  .  [name] 300 MG TABS, TAKE 1 TABLET BY MOUTH EVERY MORNING, 1 TABLET AT NOON, AND AT BEDTIME, Disp: 270 tablet, Rfl: 1 .  [name] 300 MG TABS, TAKE 1 TABLET BY MOUTH 2 TIMES DAILY, Disp: 60 tablet, Rfl: 5 .  HYDROcodone-acetaminophen (NORCO/VICODIN) 5-325 MG per tablet, Take 1 tablet by mouth every 6 (six) hours as needed for moderate pain., Disp: , Rfl:  .  levothyroxine (SYNTHROID, LEVOTHROID) 75 MCG tablet, Take 75 mcg by mouth daily before breakfast. , Disp: , Rfl:  .  loratadine (CLARITIN) 10 MG tablet, Take 10 mg by mouth daily., Disp: , Rfl:  .  meclizine (ANTIVERT) 25 MG tablet, Take 25 mg by mouth 2 (two) times daily as needed for dizziness or nausea. , Disp: , Rfl: 1 .  memantine (NAMENDA) 10 MG tablet, Take 10 mg by mouth 2 (two) times daily., Disp: , Rfl:  .  Multiple Vitamin (MULTIVITAMIN) tablet, Take 1 tablet by mouth every morning. , Disp: , Rfl:  .  NAMZARIC 28-10 MG CP24, TAKE 1 CAPSULE BY MOUTH EVERY DAY, Disp: 30 capsule, Rfl: 6 .  NAMZARIC 28-10 MG CP24, TAKE 1 CAPSULE BY MOUTH EVERY DAY, Disp: 30 capsule, Rfl: 5 .  pravastatin (PRAVACHOL) 40 MG tablet, Take 40 mg by mouth every morning. , Disp: , Rfl:  .  RABEprazole (ACIPHEX) 20 MG tablet, Take 20 mg by mouth every morning. , Disp: , Rfl:  .  sodium chloride 1 G tablet, Take 1 tablet (1 g total) by mouth 2 (two) times daily with a meal., Disp: 60 tablet, Rfl: 0 .  Travoprost, BAK Free, (TRAVATAN) 0.004 % SOLN ophthalmic solution, Place 1 drop into both eyes at bedtime., Disp: , Rfl:    Allergies  Allergen Reactions  . Benadryl [Diphenhydramine Hcl] Hives and Shortness Of Breath  . Eye Drops [Tetrahydrozoline Hcl] Other (See Comments)    Burning, itching, swelling  . Flu Virus Vaccine Shortness Of Breath  . Iohexol Hives and Shortness Of Breath     Code: HIVES, Desc: PT IS ALLERGIC TO IVP DYE/IODINE/SHRIMP.  CAUSES SOB AND HIVES PER PT.  STEPHANIE, RT-R,  Onset Date: [id number]   . Shrimp [Shellfish Allergy] Anaphylaxis  . Voltaren [Diclofenac Sodium] Other (See Comments)    Acid reflux symptoms  . Ambien [Zolpidem Tartrate] Hives  . Gluten Meal Diarrhea  . Travatan Z [Travoprost] Other (See Comments)    BRAND NAME ONLY OLCANNOT TOLERATE GENERIC FORM OF EYE DROPS, IT CONTAINS PRESERVATIVES-SENSITIVITY  . Ativan [Lorazepam]     delusional  . Hydrocodone     Only on the higher dosages  . Betadine Antibiotic-Moisturize [Bacitracin-Polymyxin B] Rash  . Biaxin [Clarithromycin] Hives  . Celebrex [Celecoxib] Rash  . Ciprofloxacin Hcl Rash       . Combigan [Brimonidine Tartrate-Timolol] Other (See Comments)    unknown  . Coumadin [Warfarin Sodium] Rash  . Ivp Dye [Iodinated Diagnostic Agents] Hives  . Levaquin [Levofloxacin In D5w] Rash  . Naprosyn [Naproxen] Rash  . Pataday [Olopatadine Hcl] Other (See Comments)  . Penicillins Hives    Has patient had a PCN reaction causing immediate rash, facial/tongue/throat swelling, SOB or lightheadedness with hypotension: Yes Has patient had a PCN reaction causing severe rash involving mucus membranes or skin necrosis: No Has patient had a PCN reaction that required hospitalization No Has patient had a PCN reaction occurring within the last 10 years: No If all of the above answers are "NO", then may proceed with Cephalosporin use.   Marland Kitchen Plavix [Clopidogrel Bisulfate] Rash  . Septra [Sulfamethoxazole-Trimethoprim] Hives     Objective: Vitals:   09/21/18 1553  Temp: 98.2 F (36.8 C)    Physical Examination:  Vascular  Examination: Capillary refill time <3 seconds x 10 digits.  Palpable DP/PT pulses b/l.  Digital hair sparse b/l.  No edema noted b/l.  Skin temperature gradient WNL b/l.  Dermatological Examination: Skin with normal turgor, texture and tone b/l.  No open wounds b/l.  No interdigital macerations noted b/l.  Elongated, thick, discolored brittle toenails with  subungual debris and pain on dorsal palpation of nailbeds 1-5 b/l.  Hyperkeratotic lesion submet head 1 right foot with tenderness to palpation. No edema, no erythema, no drainage, no flocculence.  Musculoskeletal Examination: Muscle strength 5/5 to all muscle groups b/l.  No pain, crepitus or joint discomfort with active/passive ROM.  Neurological Examination: Sensation intact 5/5 b/l with 10 gram monofilament.  Vibratory sensation intact b/l.  Assessment: 1. Mycotic nail infection with pain 1-5 b/l 2. Callus submet head 1 right foot 3. Clotting disorder  Plan: 1. Toenails 1-5 b/l were debrided in length and girth without iatrogenic laceration. Calluses pared submetatarsal head(s) 1 right footutilizing sterile scalpel blade without incident. Continue soft, supportive shoe gear daily. Report any pedal injuries to medical professional. Follow up 3 months. Patient/POA to call should  there be a question/concern in there interim.

## 2018-10-05 DIAGNOSIS — H401133 Primary open-angle glaucoma, bilateral, severe stage: Secondary | ICD-10-CM | POA: Diagnosis not present

## 2018-10-12 DIAGNOSIS — N390 Urinary tract infection, site not specified: Secondary | ICD-10-CM | POA: Diagnosis not present

## 2018-10-12 DIAGNOSIS — R350 Frequency of micturition: Secondary | ICD-10-CM | POA: Diagnosis not present

## 2018-10-18 ENCOUNTER — Emergency Department (HOSPITAL_COMMUNITY): Payer: Medicare Other

## 2018-10-18 ENCOUNTER — Other Ambulatory Visit: Payer: Self-pay

## 2018-10-18 ENCOUNTER — Inpatient Hospital Stay (HOSPITAL_COMMUNITY)
Admission: EM | Admit: 2018-10-18 | Discharge: 2018-10-22 | DRG: 534 | Disposition: A | Discharge status: Skilled Nursing Facility | Payer: Medicare Other | Attending: Internal Medicine | Admitting: Internal Medicine

## 2018-10-18 DIAGNOSIS — Z86711 Personal history of pulmonary embolism: Secondary | ICD-10-CM

## 2018-10-18 DIAGNOSIS — Z91018 Allergy to other foods: Secondary | ICD-10-CM

## 2018-10-18 DIAGNOSIS — I10 Essential (primary) hypertension: Secondary | ICD-10-CM | POA: Diagnosis present

## 2018-10-18 DIAGNOSIS — Z800 Family history of malignant neoplasm of digestive organs: Secondary | ICD-10-CM

## 2018-10-18 DIAGNOSIS — M810 Age-related osteoporosis without current pathological fracture: Secondary | ICD-10-CM | POA: Diagnosis present

## 2018-10-18 DIAGNOSIS — M2508 Hemarthrosis, other specified site: Secondary | ICD-10-CM | POA: Diagnosis not present

## 2018-10-18 DIAGNOSIS — Z9109 Other allergy status, other than to drugs and biological substances: Secondary | ICD-10-CM

## 2018-10-18 DIAGNOSIS — I129 Hypertensive chronic kidney disease with stage 1 through stage 4 chronic kidney disease, or unspecified chronic kidney disease: Secondary | ICD-10-CM | POA: Diagnosis present

## 2018-10-18 DIAGNOSIS — W19XXXA Unspecified fall, initial encounter: Secondary | ICD-10-CM

## 2018-10-18 DIAGNOSIS — Z20828 Contact with and (suspected) exposure to other viral communicable diseases: Secondary | ICD-10-CM | POA: Diagnosis not present

## 2018-10-18 DIAGNOSIS — S0990XA Unspecified injury of head, initial encounter: Secondary | ICD-10-CM | POA: Diagnosis present

## 2018-10-18 DIAGNOSIS — Z885 Allergy status to narcotic agent status: Secondary | ICD-10-CM

## 2018-10-18 DIAGNOSIS — S199XXA Unspecified injury of neck, initial encounter: Secondary | ICD-10-CM | POA: Diagnosis not present

## 2018-10-18 DIAGNOSIS — Z86718 Personal history of other venous thrombosis and embolism: Secondary | ICD-10-CM

## 2018-10-18 DIAGNOSIS — G500 Trigeminal neuralgia: Secondary | ICD-10-CM | POA: Diagnosis present

## 2018-10-18 DIAGNOSIS — Z8249 Family history of ischemic heart disease and other diseases of the circulatory system: Secondary | ICD-10-CM

## 2018-10-18 DIAGNOSIS — E785 Hyperlipidemia, unspecified: Secondary | ICD-10-CM | POA: Diagnosis present

## 2018-10-18 DIAGNOSIS — F410 Panic disorder [episodic paroxysmal anxiety] without agoraphobia: Secondary | ICD-10-CM | POA: Diagnosis present

## 2018-10-18 DIAGNOSIS — S72411A Displaced unspecified condyle fracture of lower end of right femur, initial encounter for closed fracture: Secondary | ICD-10-CM

## 2018-10-18 DIAGNOSIS — M25561 Pain in right knee: Secondary | ICD-10-CM | POA: Diagnosis present

## 2018-10-18 DIAGNOSIS — M2540 Effusion, unspecified joint: Secondary | ICD-10-CM | POA: Diagnosis present

## 2018-10-18 DIAGNOSIS — Z8672 Personal history of thrombophlebitis: Secondary | ICD-10-CM

## 2018-10-18 DIAGNOSIS — Z880 Allergy status to penicillin: Secondary | ICD-10-CM

## 2018-10-18 DIAGNOSIS — B0222 Postherpetic trigeminal neuralgia: Secondary | ICD-10-CM | POA: Diagnosis present

## 2018-10-18 DIAGNOSIS — Z7901 Long term (current) use of anticoagulants: Secondary | ICD-10-CM

## 2018-10-18 DIAGNOSIS — E222 Syndrome of inappropriate secretion of antidiuretic hormone: Secondary | ICD-10-CM | POA: Diagnosis not present

## 2018-10-18 DIAGNOSIS — M25461 Effusion, right knee: Secondary | ICD-10-CM | POA: Diagnosis not present

## 2018-10-18 DIAGNOSIS — E162 Hypoglycemia, unspecified: Secondary | ICD-10-CM | POA: Diagnosis present

## 2018-10-18 DIAGNOSIS — Z841 Family history of disorders of kidney and ureter: Secondary | ICD-10-CM

## 2018-10-18 DIAGNOSIS — M25571 Pain in right ankle and joints of right foot: Secondary | ICD-10-CM | POA: Diagnosis not present

## 2018-10-18 DIAGNOSIS — Z95828 Presence of other vascular implants and grafts: Secondary | ICD-10-CM

## 2018-10-18 DIAGNOSIS — Z833 Family history of diabetes mellitus: Secondary | ICD-10-CM

## 2018-10-18 DIAGNOSIS — Z91013 Allergy to seafood: Secondary | ICD-10-CM

## 2018-10-18 DIAGNOSIS — S72421A Displaced fracture of lateral condyle of right femur, initial encounter for closed fracture: Secondary | ICD-10-CM | POA: Diagnosis not present

## 2018-10-18 DIAGNOSIS — S72433A Displaced fracture of medial condyle of unspecified femur, initial encounter for closed fracture: Secondary | ICD-10-CM | POA: Diagnosis present

## 2018-10-18 DIAGNOSIS — I480 Paroxysmal atrial fibrillation: Secondary | ICD-10-CM | POA: Diagnosis present

## 2018-10-18 DIAGNOSIS — Z8673 Personal history of transient ischemic attack (TIA), and cerebral infarction without residual deficits: Secondary | ICD-10-CM

## 2018-10-18 DIAGNOSIS — R4189 Other symptoms and signs involving cognitive functions and awareness: Secondary | ICD-10-CM | POA: Diagnosis present

## 2018-10-18 DIAGNOSIS — Z803 Family history of malignant neoplasm of breast: Secondary | ICD-10-CM

## 2018-10-18 DIAGNOSIS — E039 Hypothyroidism, unspecified: Secondary | ICD-10-CM | POA: Diagnosis present

## 2018-10-18 DIAGNOSIS — M170 Bilateral primary osteoarthritis of knee: Secondary | ICD-10-CM | POA: Diagnosis present

## 2018-10-18 DIAGNOSIS — Z823 Family history of stroke: Secondary | ICD-10-CM

## 2018-10-18 DIAGNOSIS — K589 Irritable bowel syndrome without diarrhea: Secondary | ICD-10-CM | POA: Diagnosis present

## 2018-10-18 DIAGNOSIS — R52 Pain, unspecified: Secondary | ICD-10-CM | POA: Diagnosis not present

## 2018-10-18 DIAGNOSIS — Z01811 Encounter for preprocedural respiratory examination: Secondary | ICD-10-CM

## 2018-10-18 DIAGNOSIS — K5790 Diverticulosis of intestine, part unspecified, without perforation or abscess without bleeding: Secondary | ICD-10-CM | POA: Diagnosis present

## 2018-10-18 DIAGNOSIS — F0390 Unspecified dementia without behavioral disturbance: Secondary | ICD-10-CM | POA: Diagnosis present

## 2018-10-18 DIAGNOSIS — Z79890 Hormone replacement therapy (postmenopausal): Secondary | ICD-10-CM

## 2018-10-18 DIAGNOSIS — Z887 Allergy status to serum and vaccine status: Secondary | ICD-10-CM

## 2018-10-18 DIAGNOSIS — Z79899 Other long term (current) drug therapy: Secondary | ICD-10-CM

## 2018-10-18 DIAGNOSIS — R413 Other amnesia: Secondary | ICD-10-CM | POA: Diagnosis present

## 2018-10-18 DIAGNOSIS — E559 Vitamin D deficiency, unspecified: Secondary | ICD-10-CM | POA: Diagnosis present

## 2018-10-18 DIAGNOSIS — W1830XA Fall on same level, unspecified, initial encounter: Secondary | ICD-10-CM | POA: Diagnosis present

## 2018-10-18 DIAGNOSIS — E049 Nontoxic goiter, unspecified: Secondary | ICD-10-CM | POA: Diagnosis present

## 2018-10-18 DIAGNOSIS — G43909 Migraine, unspecified, not intractable, without status migrainosus: Secondary | ICD-10-CM | POA: Diagnosis present

## 2018-10-18 DIAGNOSIS — K219 Gastro-esophageal reflux disease without esophagitis: Secondary | ICD-10-CM | POA: Diagnosis present

## 2018-10-18 DIAGNOSIS — S72431A Displaced fracture of medial condyle of right femur, initial encounter for closed fracture: Principal | ICD-10-CM | POA: Diagnosis present

## 2018-10-18 DIAGNOSIS — F419 Anxiety disorder, unspecified: Secondary | ICD-10-CM | POA: Diagnosis present

## 2018-10-18 DIAGNOSIS — Z83511 Family history of glaucoma: Secondary | ICD-10-CM

## 2018-10-18 DIAGNOSIS — N183 Chronic kidney disease, stage 3 (moderate): Secondary | ICD-10-CM | POA: Diagnosis present

## 2018-10-18 DIAGNOSIS — Z888 Allergy status to other drugs, medicaments and biological substances status: Secondary | ICD-10-CM

## 2018-10-18 DIAGNOSIS — H409 Unspecified glaucoma: Secondary | ICD-10-CM | POA: Diagnosis present

## 2018-10-18 DIAGNOSIS — E7800 Pure hypercholesterolemia, unspecified: Secondary | ICD-10-CM | POA: Diagnosis present

## 2018-10-18 MED ORDER — ACETAMINOPHEN 325 MG PO TABS
650.0000 mg | ORAL_TABLET | Freq: Once | ORAL | Status: AC
  Administered 2018-10-18: 650 mg via ORAL
  Filled 2018-10-18: qty 2

## 2018-10-18 MED ORDER — FENTANYL CITRATE (PF) 100 MCG/2ML IJ SOLN: 25.0000 ug | Freq: Once | INTRAMUSCULAR | Status: DC

## 2018-10-18 NOTE — ED Triage Notes (Signed)
Pt brought in by EMS s/p fall at assisted living.  Pt states she was standing at her fridge and turned around and legs got tangled resulting in fall.  Pt states she hit left side of head as well as right knee and right foot.  No deformity noted

## 2018-10-18 NOTE — ED Provider Notes (Signed)
[facility] EMERGENCY DEPARTMENT Provider Note   CSN: [id number] Arrival date & time: 10/18/18  2025    History   Chief Complaint Chief Complaint  Patient presents with   Fall    HPI [name] is a 85 y.o. female with a past medical history of hypertension, prior PE, hyperlipidemia, stage III CKD, on chronic anticoagulation with subcu Lovenox every night presents to ED for mechanical fall that occurred prior to arrival.  States that she was standing at her fridge at her assisted living facility when she turned around and lost her footing.  States that she got "tangled in my feet."  States that she fell directly onto the floor on her left side.  Complains of left-sided headache as well as pain to the right posterior knee.  She has not ambulated since the fall.  She states that the headache was not immediate.  She denies any numbness in arms or legs, vision changes, vomiting, back pain.     HPI  Past Medical History:  Diagnosis Date   A-fib ([place])    Acid reflux    Anxiety    Arthritis    Chronic anticoagulation 03/07/2013   Lovenox 100 QD   Chronic kidney disease    Stage III   CVA (cerebral infarction) 1964   Dementia ([place])    Dr Dohmeier   Diverticulosis    DVT (deep venous thrombosis) ([place]) 2007   recurrent w PE s/p [place] filter   GERD (gastroesophageal reflux disease)    w hiatal hernia endoscopy 2003, Dr Penelope Coop   Glaucoma    Hypercholesterolemia    Hypertension    Hypoglycemia    Hypothyroidism    goiter   IBS (irritable bowel syndrome)    Junctional bradycardia    resolved with discontinuation of sotalol (2014) after 10 yrs   Memory loss    Migraine    Osteoarthritis    in Bilateral knees and back. -Dr Latanya Maudlin   Osteoporosis    Panic attacks    Phlebitis    Postherpetic trigeminal neuralgia 10/20/2012   Pulmonary embolism recurrent    IVC filter   Shingles    zoster 1990 on the back and on the  abdomen in 2007,facial zoster 2006   Trigeminal neuralgia    r face--Dr Dohmeier   Unspecified cerebral artery occlusion with cerebral infarction    Vitamin D deficiency     Patient Active Problem List   Diagnosis Date Noted   Cognitive impairment 10/31/2014   TIA (transient ischemic attack) 12/03/2013   UTI (lower urinary tract infection) 12/03/2013   SIADH (syndrome of inappropriate ADH production) ([place]) 08/07/2013   Back pain 08/03/2013   Closed left arm fracture 08/03/2013   Essential hypertension 04/07/2013   PAF (paroxysmal atrial fibrillation) ([place]) 03/07/2013   Chronic anticoagulation 03/07/2013   Obesity, unspecified 03/07/2013   Nausea 03/07/2013   Hypothyroidism 03/07/2013   Bradycardia 03/06/2013   Dermatophytosis of nail 12/30/2012   Other specified congenital anomaly of skin 12/30/2012   Unspecified cerebral artery occlusion with cerebral infarction    Unspecified deficiency anemia 10/21/2012   Postherpetic trigeminal neuralgia 10/20/2012    Past Surgical History:  Procedure Laterality Date   APPENDECTOMY  1945   biopsies of breast Bilateral 1971   blood clots  06/2006   lower abdomen   CARDIAC CATHETERIZATION  08/2001   A-fib/Palpitations Dr. Daneen Schick   CHOLECYSTECTOMY  01/1983   [place], DIAGNOSTIC / THERAPEUTIC  1952/1977  x2   FOOT SURGERY Right 1978   Cyst removed from Right [place]   insertion of vena cova filter  2007   LEG / ANKLE SOFT TISSUE BIOPSY Right    precancerous   migraine speech impairment  07/1963   phlebitis  12/2005   PULMONARY EMBOLISM SURGERY  03/2005   TONSILLECTOMY     1954     OB History   No obstetric history on file.      Home Medications    Prior to Admission medications   Medication Sig Start Date End Date Taking? Authorizing Provider  amLODipine (NORVASC) 2.5 MG tablet Take 1 tablet (2.5 mg total) by mouth  daily. Please make overdue appt for future refills. [phone]. 2nd attempt 05/20/18   Belva Crome, MD  carbamazepine (CARBATROL) 100 MG 12 hr capsule Take 2 capsules (200 mg total) by mouth 2 (two) times daily. 08/06/17   Jaffe, Adam R, DO  CARBATROL 100 MG 12 hr capsule TAKE 2 CAPSULES BY MOUTH 2 TIMES DAILY 07/09/18   Pieter Partridge, DO  cholecalciferol (VITAMIN D) 1000 units tablet Take 1,000 Units by mouth daily.    [provider]  clindamycin (CLEOCIN) 150 MG capsule TAKE 4 CAPSULES BY MOUTH 1 HOUR PRIOR TO DENTAL APPOINTMENT 05/26/18   [provider]  donepezil (ARICEPT) 10 MG tablet Take 10 mg by mouth at bedtime. 06/01/18   [provider]  enoxaparin (LOVENOX) 100 MG/ML injection Inject 100 mg into the skin at bedtime.     [provider]  EPINEPHrine (EPIPEN IJ) Inject as directed once as needed. For allergic reactions    [provider]  Fe Fum-FA-B Cmp-C-Zn-Mg-Mn-Cu (HEMOCYTE PLUS) 106-1 MG CAPS Take 1 capsule by mouth daily.     [provider]  fluticasone (FLONASE) 50 MCG/ACT nasal spray Place 1 spray into left nostril daily as needed for allergies or rhinitis.  06/27/14   [provider]  folic acid (FOLVITE) 1 MG tablet Take 1 mg by mouth every morning.     [provider]  gabapentin (NEURONTIN) 300 MG capsule Take 1 capsule (300 mg total) by mouth 2 (two) times daily. 03/31/18   Pieter Partridge, DO  Gabapentin, Once-Daily, ([name]) 300 MG TABS Take 300 mg by mouth 2 (two) times daily. 08/06/17   Jaffe, Adam R, DO  [name] 300 MG TABS Take 300 mg by mouth 3 (three) times daily. 12/12/15   [provider]  [name] 300 MG TABS TAKE 1 TABLET BY MOUTH EVERY MORNING, 1 TABLET AT NOON, AND AT BEDTIME 07/10/16   Jaffe, Adam R, DO  [name] 300 MG TABS TAKE 1 TABLET BY MOUTH 2 TIMES DAILY 12/23/17   Pieter Partridge, DO  HYDROcodone-acetaminophen (NORCO/VICODIN) 5-325 MG per tablet Take 1 tablet by mouth every 6 (six) hours  as needed for moderate pain.    [provider]  levothyroxine (SYNTHROID, LEVOTHROID) 75 MCG tablet Take 75 mcg by mouth daily before breakfast.     [provider]  loratadine (CLARITIN) 10 MG tablet Take 10 mg by mouth daily.    [provider]  meclizine (ANTIVERT) 25 MG tablet Take 25 mg by mouth 2 (two) times daily as needed for dizziness or nausea.  02/18/14   [provider]  memantine (NAMENDA) 10 MG tablet Take 10 mg by mouth 2 (two) times daily. 06/01/18   [provider]  Multiple Vitamin (  MULTIVITAMIN) tablet Take 1 tablet by mouth every morning.     [provider]  [facility] 28-10 MG CP24 TAKE 1 CAPSULE BY MOUTH EVERY DAY 12/30/16   Pieter Partridge, DO  NAMZARIC 28-10 MG CP24 TAKE 1 CAPSULE BY MOUTH EVERY DAY 05/20/18   Tomi Likens, Adam R, DO  pravastatin (PRAVACHOL) 40 MG tablet Take 40 mg by mouth every morning.     [provider]  RABEprazole (ACIPHEX) 20 MG tablet Take 20 mg by mouth every morning.     [provider]  sodium chloride 1 G tablet Take 1 tablet (1 g total) by mouth 2 (two) times daily with a meal. 08/07/13   Donne Hazel, MD  Travoprost, BAK Free, (TRAVATAN) 0.004 % SOLN ophthalmic solution Place 1 drop into both eyes at bedtime.    [provider]    Family History Family History  Problem Relation Age of Onset   Diabetes Mother    Hypertension Mother    Glaucoma Mother    CVA Mother    CAD Mother    Hypertension Father    Heart attack Father    Kidney failure Father    Heart attack Brother        2 half brothers   Hypertension Brother    CVA Brother    Glaucoma Other    Glaucoma Other    Prostate cancer Maternal Grandfather    Breast cancer Maternal Aunt    Colon cancer Maternal Uncle    Rectal cancer Maternal Uncle     Social History Social History   Tobacco Use   Smoking status: Never Smoker   Smokeless tobacco: Never Used  Substance Use Topics    Alcohol use: No    Comment: very moderate, none  in the last two months   Drug use: No     Allergies   Benadryl [diphenhydramine hcl], Eye drops [tetrahydrozoline hcl], Flu virus vaccine, Iohexol, Shrimp [shellfish allergy], Voltaren [diclofenac sodium], Ambien [zolpidem tartrate], Gluten meal, Travatan z [travoprost], Ativan [lorazepam], Hydrocodone, Betadine antibiotic-moisturize [bacitracin-polymyxin b], Biaxin [clarithromycin], Celebrex [celecoxib], Ciprofloxacin hcl, Combigan [brimonidine tartrate-timolol], Coumadin [warfarin sodium], Ivp dye [iodinated diagnostic agents], Levaquin [levofloxacin in d5w], Naprosyn [naproxen], Pataday [olopatadine hcl], Penicillins, Plavix [clopidogrel bisulfate], and Septra [sulfamethoxazole-trimethoprim]   Review of Systems Review of Systems  Constitutional: Negative for appetite change, chills and fever.  HENT: Negative for ear pain, rhinorrhea, sneezing and sore throat.   Eyes: Negative for photophobia and visual disturbance.  Respiratory: Negative for cough, chest tightness, shortness of breath and wheezing.   Cardiovascular: Negative for chest pain and palpitations.  Gastrointestinal: Negative for abdominal pain, blood in stool, constipation, diarrhea, nausea and vomiting.  Genitourinary: Negative for dysuria, hematuria and urgency.  Musculoskeletal: Positive for arthralgias. Negative for myalgias.  Skin: Negative for rash.  Neurological: Positive for headaches. Negative for dizziness, weakness and light-headedness.     Physical Exam Updated Vital Signs BP (!) 139/57    Pulse 74    Temp 98.1 F (36.7 C) (Oral)    Resp 16    Ht 5\' 6"  (1.676 m)    Wt 90.7 kg    SpO2 97%    BMI 32.28 kg/m   Physical Exam Vitals signs and nursing note reviewed.  Constitutional:      General: She is not in acute distress.    Appearance: She is well-developed.  HENT:     Head: Normocephalic and atraumatic.     Nose: Nose normal.  Eyes:  General: No  scleral icterus.       Right eye: No discharge.        Left eye: No discharge.     Conjunctiva/sclera: Conjunctivae normal.     Pupils: Pupils are equal, round, and reactive to light.  Neck:     Musculoskeletal: Normal range of motion and neck supple.  Cardiovascular:     Rate and Rhythm: Normal rate and regular rhythm.     Heart sounds: Normal heart sounds. No murmur. No friction rub. No gallop.   Pulmonary:     Effort: Pulmonary effort is normal. No respiratory distress.     Breath sounds: Normal breath sounds.  Abdominal:     General: Bowel sounds are normal. There is no distension.     Palpations: Abdomen is soft.     Tenderness: There is no abdominal tenderness. There is no guarding.  Musculoskeletal: Normal range of motion.        General: Swelling (Right knee) present.     Comments: Diffuse tenderness palpation of the right knee.  Able to flex and extend although reports pain with range of motion.  2+ DP pulse palpated bilaterally. FROM of bilateral hips. No deformity noted.  Skin:    General: Skin is warm and dry.     Findings: No rash.  Neurological:     General: No focal deficit present.     Mental Status: She is alert and oriented to person, place, and time.     Cranial Nerves: No cranial nerve deficit.     Sensory: No sensory deficit.     Motor: No weakness or abnormal muscle tone.     Coordination: Coordination normal.     Comments: Pupils reactive. No facial asymmetry noted. Cranial nerves appear grossly intact. Sensation intact to light touch on face, BUE and BLE. Strength 5/5 in BUE and BLE.       ED Treatments / Results  Labs (all labs ordered are listed, but only abnormal results are displayed) Labs Reviewed - No data to display  EKG None  Radiology Ct Head Wo Contrast  Result Date: 10/18/2018 CLINICAL DATA:  Fall hit left side of head EXAM: CT HEAD WITHOUT CONTRAST CT CERVICAL SPINE WITHOUT CONTRAST TECHNIQUE: Multidetector CT imaging of the head and  cervical spine was performed following the standard protocol without intravenous contrast. Multiplanar CT image reconstructions of the cervical spine were also generated. COMPARISON:  CT brain and cervical spine 05/10/2015 FINDINGS: CT HEAD FINDINGS Brain: No acute territorial infarction, hemorrhage or intracranial mass. Mild atrophy. Moderate small vessel ischemic changes of the white matter. Chronic lacunar infarct left cerebellum. Chronic lacunar infarct left basal ganglia and white matter. Slight ventricular enlargement, likely due to progression of atrophy. Vascular: No hyperdense vessels.  Carotid vascular calcification Skull: Normal. Negative for fracture or focal lesion. Sinuses/Orbits: No acute finding. Other: None CT CERVICAL SPINE FINDINGS Alignment: No subluxation.  Facet alignment within normal limits. Skull base and vertebrae: No acute fracture. No primary bone lesion or focal pathologic process. Soft tissues and spinal canal: No prevertebral fluid or swelling. No visible canal hematoma. Disc levels:  Mild degenerative changes at C4-C5 and C7-T1. Upper chest: Lung apices are clear. Other: None IMPRESSION: 1. No CT evidence for acute intracranial abnormality. Atrophy and small vessel ischemic changes of the white matter 2. No acute osseous abnormality of the cervical spine Electronically Signed   By: Donavan Foil M.D.   On: 10/18/2018 23:22   Ct Cervical Spine Wo Contrast  Result Date: 10/18/2018 CLINICAL DATA:  Fall hit left side of head EXAM: CT HEAD WITHOUT CONTRAST CT CERVICAL SPINE WITHOUT CONTRAST TECHNIQUE: Multidetector CT imaging of the head and cervical spine was performed following the standard protocol without intravenous contrast. Multiplanar CT image reconstructions of the cervical spine were also generated. COMPARISON:  CT brain and cervical spine 05/10/2015 FINDINGS: CT HEAD FINDINGS Brain: No acute territorial infarction, hemorrhage or intracranial mass. Mild atrophy. Moderate  small vessel ischemic changes of the white matter. Chronic lacunar infarct left cerebellum. Chronic lacunar infarct left basal ganglia and white matter. Slight ventricular enlargement, likely due to progression of atrophy. Vascular: No hyperdense vessels.  Carotid vascular calcification Skull: Normal. Negative for fracture or focal lesion. Sinuses/Orbits: No acute finding. Other: None CT CERVICAL SPINE FINDINGS Alignment: No subluxation.  Facet alignment within normal limits. Skull base and vertebrae: No acute fracture. No primary bone lesion or focal pathologic process. Soft tissues and spinal canal: No prevertebral fluid or swelling. No visible canal hematoma. Disc levels:  Mild degenerative changes at C4-C5 and C7-T1. Upper chest: Lung apices are clear. Other: None IMPRESSION: 1. No CT evidence for acute intracranial abnormality. Atrophy and small vessel ischemic changes of the white matter 2. No acute osseous abnormality of the cervical spine Electronically Signed   By: Donavan Foil M.D.   On: 10/18/2018 23:22   Dg Knee Complete 4 Views Right  Result Date: 10/18/2018 CLINICAL DATA:  Initial evaluation for acute trauma, fall. EXAM: RIGHT KNEE - COMPLETE 4+ VIEW COMPARISON:  None. FINDINGS: Focal cortical irregularity seen at the medial femoral condyle, suspicious for possible acute nondisplaced fracture. Associated large joint effusion within the suprapatellar recess. Advanced degenerative osteoarthritic changes seen about the knee, most notable at the patellofemoral and lateral femorotibial joint space compartments. Underlying osteopenia. Overlying soft tissues within normal limits. IMPRESSION: 1. Focal cortical irregularity at the medial femoral condyle, suspicious for possible acute nondisplaced fracture. Correlation with physical exam recommended. 2. Associated large joint effusion. 3. Advanced degenerative osteoarthrosis. Electronically Signed   By: Jeannine Boga M.D.   On: 10/18/2018 21:28     Procedures Procedures (including critical care time)  Medications Ordered in ED Medications  acetaminophen (TYLENOL) tablet 650 mg (has no administration in time range)     Initial Impression / Assessment and Plan / ED Course  I have reviewed the triage vital signs and the nursing notes.  Pertinent labs & imaging results that were available during my care of the patient were reviewed by me and considered in my medical decision making (see chart for details).  Clinical Course as of Oct 17 2333  Nancy Fetter Oct 18, 2018  2250 Spoke to [facility] who states radiologist requests additional images of CT head. They have spoke to CT tech who is sending over images now. Will continue to wait for results.   [HK]    Clinical Course User Index [HK] Delia Heady, PA-C       85 year old female on chronic anticoagulation presents to ED for mechanical fall that occurred prior to arrival.  States that her feet got tangled up while she was turning in front of the fridge.  [place] and hit the left side of her head and reports pain in her right knee.  There is some edema and tenderness palpation of the posterior right knee.  Pain with range of motion.  Area is neurovascularly intact.  Full range of motion of ankle.  Pulses are intact.  No deficits neurological exam noted.  Will  obtain x-rays of the knee, CT of the head and neck and reassess.  11:35 PM CT of the head and cervical spine are negative for acute abnormality.  Still awaiting CT of the knee. I have updated patient's daughter about plan. Will order pain medication. Care handed off to [place] who will dispo after CT knee.  Final Clinical Impressions(s) / ED Diagnoses   Final diagnoses:  Fall, initial encounter  Injury of head, initial encounter    ED Discharge Orders    None       Delia Heady, PA-C 10/18/18 2337    Sherwood Gambler, MD 10/22/18 [phone]

## 2018-10-19 ENCOUNTER — Inpatient Hospital Stay (HOSPITAL_COMMUNITY): Payer: Medicare Other

## 2018-10-19 ENCOUNTER — Other Ambulatory Visit: Payer: Self-pay

## 2018-10-19 DIAGNOSIS — Z7901 Long term (current) use of anticoagulants: Secondary | ICD-10-CM | POA: Diagnosis not present

## 2018-10-19 DIAGNOSIS — Z86711 Personal history of pulmonary embolism: Secondary | ICD-10-CM | POA: Diagnosis not present

## 2018-10-19 DIAGNOSIS — Z888 Allergy status to other drugs, medicaments and biological substances status: Secondary | ICD-10-CM | POA: Diagnosis not present

## 2018-10-19 DIAGNOSIS — E7800 Pure hypercholesterolemia, unspecified: Secondary | ICD-10-CM | POA: Diagnosis not present

## 2018-10-19 DIAGNOSIS — E568 Deficiency of other vitamins: Secondary | ICD-10-CM | POA: Diagnosis not present

## 2018-10-19 DIAGNOSIS — I959 Hypotension, unspecified: Secondary | ICD-10-CM | POA: Diagnosis not present

## 2018-10-19 DIAGNOSIS — Z01811 Encounter for preprocedural respiratory examination: Secondary | ICD-10-CM | POA: Diagnosis not present

## 2018-10-19 DIAGNOSIS — R41841 Cognitive communication deficit: Secondary | ICD-10-CM | POA: Diagnosis not present

## 2018-10-19 DIAGNOSIS — S0990XA Unspecified injury of head, initial encounter: Secondary | ICD-10-CM | POA: Diagnosis not present

## 2018-10-19 DIAGNOSIS — Z885 Allergy status to narcotic agent status: Secondary | ICD-10-CM | POA: Diagnosis not present

## 2018-10-19 DIAGNOSIS — I10 Essential (primary) hypertension: Secondary | ICD-10-CM | POA: Diagnosis not present

## 2018-10-19 DIAGNOSIS — E669 Obesity, unspecified: Secondary | ICD-10-CM | POA: Diagnosis not present

## 2018-10-19 DIAGNOSIS — F0390 Unspecified dementia without behavioral disturbance: Secondary | ICD-10-CM | POA: Diagnosis present

## 2018-10-19 DIAGNOSIS — S72421A Displaced fracture of lateral condyle of right femur, initial encounter for closed fracture: Secondary | ICD-10-CM | POA: Diagnosis not present

## 2018-10-19 DIAGNOSIS — E039 Hypothyroidism, unspecified: Secondary | ICD-10-CM | POA: Diagnosis not present

## 2018-10-19 DIAGNOSIS — E871 Hypo-osmolality and hyponatremia: Secondary | ICD-10-CM | POA: Diagnosis not present

## 2018-10-19 DIAGNOSIS — R52 Pain, unspecified: Secondary | ICD-10-CM | POA: Diagnosis not present

## 2018-10-19 DIAGNOSIS — R4189 Other symptoms and signs involving cognitive functions and awareness: Secondary | ICD-10-CM | POA: Diagnosis not present

## 2018-10-19 DIAGNOSIS — Z887 Allergy status to serum and vaccine status: Secondary | ICD-10-CM | POA: Diagnosis not present

## 2018-10-19 DIAGNOSIS — I480 Paroxysmal atrial fibrillation: Secondary | ICD-10-CM

## 2018-10-19 DIAGNOSIS — Z91013 Allergy to seafood: Secondary | ICD-10-CM | POA: Diagnosis not present

## 2018-10-19 DIAGNOSIS — S72411A Displaced unspecified condyle fracture of lower end of right femur, initial encounter for closed fracture: Secondary | ICD-10-CM

## 2018-10-19 DIAGNOSIS — S72433A Displaced fracture of medial condyle of unspecified femur, initial encounter for closed fracture: Secondary | ICD-10-CM | POA: Diagnosis present

## 2018-10-19 DIAGNOSIS — R58 Hemorrhage, not elsewhere classified: Secondary | ICD-10-CM | POA: Diagnosis not present

## 2018-10-19 DIAGNOSIS — K219 Gastro-esophageal reflux disease without esophagitis: Secondary | ICD-10-CM | POA: Diagnosis present

## 2018-10-19 DIAGNOSIS — E222 Syndrome of inappropriate secretion of antidiuretic hormone: Secondary | ICD-10-CM | POA: Diagnosis not present

## 2018-10-19 DIAGNOSIS — D649 Anemia, unspecified: Secondary | ICD-10-CM | POA: Diagnosis not present

## 2018-10-19 DIAGNOSIS — G20 Parkinson's disease: Secondary | ICD-10-CM

## 2018-10-19 DIAGNOSIS — B0222 Postherpetic trigeminal neuralgia: Secondary | ICD-10-CM | POA: Diagnosis present

## 2018-10-19 DIAGNOSIS — I129 Hypertensive chronic kidney disease with stage 1 through stage 4 chronic kidney disease, or unspecified chronic kidney disease: Secondary | ICD-10-CM | POA: Diagnosis present

## 2018-10-19 DIAGNOSIS — Z880 Allergy status to penicillin: Secondary | ICD-10-CM | POA: Diagnosis not present

## 2018-10-19 DIAGNOSIS — H4010X0 Unspecified open-angle glaucoma, stage unspecified: Secondary | ICD-10-CM | POA: Diagnosis not present

## 2018-10-19 DIAGNOSIS — M2550 Pain in unspecified joint: Secondary | ICD-10-CM | POA: Diagnosis not present

## 2018-10-19 DIAGNOSIS — Z01818 Encounter for other preprocedural examination: Secondary | ICD-10-CM | POA: Diagnosis not present

## 2018-10-19 DIAGNOSIS — J3089 Other allergic rhinitis: Secondary | ICD-10-CM | POA: Diagnosis not present

## 2018-10-19 DIAGNOSIS — K5900 Constipation, unspecified: Secondary | ICD-10-CM | POA: Diagnosis not present

## 2018-10-19 DIAGNOSIS — Z20828 Contact with and (suspected) exposure to other viral communicable diseases: Secondary | ICD-10-CM | POA: Diagnosis present

## 2018-10-19 DIAGNOSIS — Z23 Encounter for immunization: Secondary | ICD-10-CM | POA: Diagnosis not present

## 2018-10-19 DIAGNOSIS — F419 Anxiety disorder, unspecified: Secondary | ICD-10-CM | POA: Diagnosis present

## 2018-10-19 DIAGNOSIS — Z8673 Personal history of transient ischemic attack (TIA), and cerebral infarction without residual deficits: Secondary | ICD-10-CM | POA: Diagnosis not present

## 2018-10-19 DIAGNOSIS — R0902 Hypoxemia: Secondary | ICD-10-CM | POA: Diagnosis not present

## 2018-10-19 DIAGNOSIS — S72434A Nondisplaced fracture of medial condyle of right femur, initial encounter for closed fracture: Secondary | ICD-10-CM

## 2018-10-19 DIAGNOSIS — M2508 Hemarthrosis, other specified site: Secondary | ICD-10-CM | POA: Diagnosis present

## 2018-10-19 DIAGNOSIS — S72431A Displaced fracture of medial condyle of right femur, initial encounter for closed fracture: Secondary | ICD-10-CM | POA: Diagnosis not present

## 2018-10-19 DIAGNOSIS — Z9109 Other allergy status, other than to drugs and biological substances: Secondary | ICD-10-CM | POA: Diagnosis not present

## 2018-10-19 DIAGNOSIS — F0281 Dementia in other diseases classified elsewhere with behavioral disturbance: Secondary | ICD-10-CM

## 2018-10-19 DIAGNOSIS — W1830XA Fall on same level, unspecified, initial encounter: Secondary | ICD-10-CM | POA: Diagnosis present

## 2018-10-19 DIAGNOSIS — Z91018 Allergy to other foods: Secondary | ICD-10-CM | POA: Diagnosis not present

## 2018-10-19 DIAGNOSIS — N183 Chronic kidney disease, stage 3 (moderate): Secondary | ICD-10-CM | POA: Diagnosis present

## 2018-10-19 DIAGNOSIS — S72411D Displaced unspecified condyle fracture of lower end of right femur, subsequent encounter for closed fracture with routine healing: Secondary | ICD-10-CM | POA: Diagnosis not present

## 2018-10-19 DIAGNOSIS — Z111 Encounter for screening for respiratory tuberculosis: Secondary | ICD-10-CM | POA: Diagnosis not present

## 2018-10-19 DIAGNOSIS — G2581 Restless legs syndrome: Secondary | ICD-10-CM | POA: Diagnosis not present

## 2018-10-19 DIAGNOSIS — Z86718 Personal history of other venous thrombosis and embolism: Secondary | ICD-10-CM | POA: Diagnosis not present

## 2018-10-19 DIAGNOSIS — M6281 Muscle weakness (generalized): Secondary | ICD-10-CM | POA: Diagnosis not present

## 2018-10-19 DIAGNOSIS — E785 Hyperlipidemia, unspecified: Secondary | ICD-10-CM | POA: Diagnosis present

## 2018-10-19 DIAGNOSIS — Z7401 Bed confinement status: Secondary | ICD-10-CM | POA: Diagnosis not present

## 2018-10-19 LAB — CBC WITH DIFFERENTIAL/PLATELET
Abs Immature Granulocytes: 0.05 10*3/uL (ref 0.00–0.07)
Basophils Absolute: 0 10*3/uL (ref 0.0–0.1)
Basophils Relative: 0 %
Eosinophils Absolute: 0.1 10*3/uL (ref 0.0–0.5)
Eosinophils Relative: 1 %
HCT: 28.6 % — ABNORMAL LOW (ref 36.0–46.0)
Hemoglobin: 9.1 g/dL — ABNORMAL LOW (ref 12.0–15.0)
Immature Granulocytes: 1 %
Lymphocytes Relative: 13 %
Lymphs Abs: 1.4 10*3/uL (ref 0.7–4.0)
MCH: 26.5 pg (ref 26.0–34.0)
MCHC: 31.8 g/dL (ref 30.0–36.0)
MCV: 83.1 fL (ref 80.0–100.0)
Monocytes Absolute: 1.4 10*3/uL — ABNORMAL HIGH (ref 0.1–1.0)
Monocytes Relative: 12 %
Neutro Abs: 8.1 10*3/uL — ABNORMAL HIGH (ref 1.7–7.7)
Neutrophils Relative %: 73 %
Platelets: 101 10*3/uL — ABNORMAL LOW (ref 150–400)
RBC: 3.44 MIL/uL — ABNORMAL LOW (ref 3.87–5.11)
RDW: 17.2 % — ABNORMAL HIGH (ref 11.5–15.5)
WBC: 11.1 10*3/uL — ABNORMAL HIGH (ref 4.0–10.5)
nRBC: 0 % (ref 0.0–0.2)

## 2018-10-19 LAB — TYPE AND SCREEN
ABO/RH(D): O POS
Antibody Screen: NEGATIVE

## 2018-10-19 LAB — PROTIME-INR
INR: 1.2 (ref 0.8–1.2)
Prothrombin Time: 15.1 seconds (ref 11.4–15.2)

## 2018-10-19 LAB — BASIC METABOLIC PANEL
Anion gap: 12 (ref 5–15)
BUN: 24 mg/dL — ABNORMAL HIGH (ref 8–23)
CO2: 19 mmol/L — ABNORMAL LOW (ref 22–32)
Calcium: 8.8 mg/dL — ABNORMAL LOW (ref 8.9–10.3)
Chloride: 107 mmol/L (ref 98–111)
Creatinine, Ser: 1.37 mg/dL — ABNORMAL HIGH (ref 0.44–1.00)
GFR calc Af Amer: 40 mL/min — ABNORMAL LOW (ref >60)
GFR calc non Af Amer: 35 mL/min — ABNORMAL LOW (ref >60)
Glucose, Bld: 110 mg/dL — ABNORMAL HIGH (ref 70–99)
Potassium: 4.1 mmol/L (ref 3.5–5.1)
Sodium: 138 mmol/L (ref 135–145)

## 2018-10-19 LAB — SARS CORONAVIRUS 2 BY RT PCR (HOSPITAL ORDER, PERFORMED IN ~~LOC~~ HOSPITAL LAB): SARS Coronavirus 2: NEGATIVE

## 2018-10-19 MED ORDER — MEMANTINE HCL ER 28 MG PO CP24
28.0000 mg | ORAL_CAPSULE | Freq: Every day | ORAL | Status: DC
  Administered 2018-10-19 – 2018-10-21 (×3): 28 mg via ORAL
  Filled 2018-10-19 (×4): qty 1

## 2018-10-19 MED ORDER — DONEPEZIL HCL 10 MG PO TABS
10.0000 mg | ORAL_TABLET | Freq: Every day | ORAL | Status: DC
  Administered 2018-10-19 – 2018-10-22 (×4): 10 mg via ORAL
  Filled 2018-10-19 (×4): qty 1

## 2018-10-19 MED ORDER — SODIUM CHLORIDE 1 G PO TABS
1.0000 g | ORAL_TABLET | Freq: Two times a day (BID) | ORAL | Status: DC
  Administered 2018-10-19 – 2018-10-22 (×6): 1 g via ORAL
  Filled 2018-10-19 (×6): qty 1

## 2018-10-19 MED ORDER — PANTOPRAZOLE SODIUM 40 MG PO TBEC
40.0000 mg | DELAYED_RELEASE_TABLET | Freq: Every day | ORAL | Status: DC
  Administered 2018-10-19 – 2018-10-22 (×4): 40 mg via ORAL
  Filled 2018-10-19 (×4): qty 1

## 2018-10-19 MED ORDER — GABAPENTIN 300 MG PO CAPS
300.0000 mg | ORAL_CAPSULE | Freq: Two times a day (BID) | ORAL | Status: DC
  Administered 2018-10-19 – 2018-10-22 (×7): 300 mg via ORAL
  Filled 2018-10-19 (×7): qty 1

## 2018-10-19 MED ORDER — ENSURE ENLIVE PO LIQD
237.0000 mL | Freq: Every day | ORAL | Status: DC
  Administered 2018-10-19 – 2018-10-20 (×2): 237 mL via ORAL

## 2018-10-19 MED ORDER — LEVOTHYROXINE SODIUM 75 MCG PO TABS
75.0000 ug | ORAL_TABLET | Freq: Every day | ORAL | Status: DC
  Administered 2018-10-20 – 2018-10-22 (×3): 75 ug via ORAL
  Filled 2018-10-19 (×3): qty 1

## 2018-10-19 MED ORDER — CARBAMAZEPINE ER 200 MG PO TB12
200.0000 mg | ORAL_TABLET | Freq: Two times a day (BID) | ORAL | Status: DC
  Administered 2018-10-19 – 2018-10-21 (×6): 200 mg via ORAL
  Filled 2018-10-19 (×7): qty 1

## 2018-10-19 MED ORDER — HYDROCODONE-ACETAMINOPHEN 5-325 MG PO TABS
1.0000 | ORAL_TABLET | Freq: Four times a day (QID) | ORAL | Status: DC | PRN
  Administered 2018-10-19: 1 via ORAL
  Administered 2018-10-20: 2 via ORAL
  Administered 2018-10-20 – 2018-10-21 (×3): 1 via ORAL
  Administered 2018-10-21 – 2018-10-22 (×2): 2 via ORAL
  Filled 2018-10-19 (×3): qty 2
  Filled 2018-10-19 (×5): qty 1

## 2018-10-19 MED ORDER — MEMANTINE HCL-DONEPEZIL HCL ER 28-10 MG PO CP24: 1.0000 | ORAL_CAPSULE | Freq: Every day | ORAL | Status: DC

## 2018-10-19 MED ORDER — MORPHINE SULFATE (PF) 2 MG/ML IV SOLN
0.5000 mg | INTRAVENOUS | Status: DC | PRN
  Administered 2018-10-19 (×2): 0.5 mg via INTRAVENOUS
  Filled 2018-10-19 (×5): qty 1

## 2018-10-19 MED ORDER — LORATADINE 10 MG PO TABS
10.0000 mg | ORAL_TABLET | Freq: Every day | ORAL | Status: DC
  Administered 2018-10-19 – 2018-10-22 (×4): 10 mg via ORAL
  Filled 2018-10-19 (×4): qty 1

## 2018-10-19 MED ORDER — FLUTICASONE PROPIONATE 50 MCG/ACT NA SUSP: 1.0000 | Freq: Every day | NASAL | Status: DC | PRN

## 2018-10-19 MED ORDER — ADULT MULTIVITAMIN W/MINERALS CH
1.0000 | ORAL_TABLET | Freq: Every day | ORAL | Status: DC
  Administered 2018-10-19 – 2018-10-22 (×4): 1 via ORAL
  Filled 2018-10-19 (×4): qty 1

## 2018-10-19 MED ORDER — SODIUM CHLORIDE 1 G PO TABS: 1.0000 g | ORAL_TABLET | Freq: Two times a day (BID) | ORAL | Status: DC

## 2018-10-19 MED ORDER — OXYCODONE-ACETAMINOPHEN 5-325 MG PO TABS
1.0000 | ORAL_TABLET | Freq: Once | ORAL | Status: AC
  Administered 2018-10-19: 1 via ORAL
  Filled 2018-10-19: qty 1

## 2018-10-19 MED ORDER — PRAVASTATIN SODIUM 40 MG PO TABS
40.0000 mg | ORAL_TABLET | Freq: Every day | ORAL | Status: DC
  Administered 2018-10-19 – 2018-10-21 (×3): 40 mg via ORAL
  Filled 2018-10-19 (×3): qty 1

## 2018-10-19 MED ORDER — ENOXAPARIN SODIUM 100 MG/ML ~~LOC~~ SOLN
100.0000 mg | Freq: Every day | SUBCUTANEOUS | Status: DC
  Administered 2018-10-19 – 2018-10-21 (×3): 100 mg via SUBCUTANEOUS
  Filled 2018-10-19 (×3): qty 1

## 2018-10-19 MED ORDER — VITAMIN D 25 MCG (1000 UNIT) PO TABS
1000.0000 [IU] | ORAL_TABLET | Freq: Every day | ORAL | Status: DC
  Administered 2018-10-19 – 2018-10-22 (×4): 1000 [IU] via ORAL
  Filled 2018-10-19 (×4): qty 1

## 2018-10-19 MED ORDER — AMLODIPINE BESYLATE 2.5 MG PO TABS
2.5000 mg | ORAL_TABLET | Freq: Every day | ORAL | Status: DC
  Administered 2018-10-19 – 2018-10-22 (×4): 2.5 mg via ORAL
  Filled 2018-10-19 (×4): qty 1

## 2018-10-19 MED ORDER — FENTANYL CITRATE (PF) 100 MCG/2ML IJ SOLN
50.0000 ug | Freq: Once | INTRAMUSCULAR | Status: AC
  Administered 2018-10-19: 50 ug via INTRAVENOUS
  Filled 2018-10-19: qty 2

## 2018-10-19 MED ORDER — LATANOPROST 0.005 % OP SOLN
1.0000 [drp] | Freq: Every day | OPHTHALMIC | Status: DC
  Administered 2018-10-19 – 2018-10-21 (×3): 1 [drp] via OPHTHALMIC
  Filled 2018-10-19: qty 2.5

## 2018-10-19 MED ORDER — FOLIC ACID 1 MG PO TABS
1.0000 mg | ORAL_TABLET | Freq: Every day | ORAL | Status: DC
  Administered 2018-10-19 – 2018-10-22 (×4): 1 mg via ORAL
  Filled 2018-10-19 (×4): qty 1

## 2018-10-19 NOTE — ED Notes (Signed)
Pt from [facility]

## 2018-10-19 NOTE — H&P (Signed)
History and Physical    [name]:[id number] DOB: [birth year] DOA: 10/18/2018  PCP: [name], MD  Patient coming from: ALF  I have personally briefly reviewed patient's old medical records in [place]  Chief Complaint: Fall, knee pain  HPI: [name] is a 85 y.o. 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  Patient presents to the ED at [facility] following a mechanical fall.  She was standing at her fridge at the [facility], turned around, legs got tangled.  Landed on L side.  L sided headache as well as pain to posterior RIGHT knee.  Hasnt ambulated since fall.    No numbness, weakness, vision changes, back pain, neck pain.  ED Course: fracture of medial condyle of distal R femur with large joint effusion and hemorrhage into joint.  Of note, hadnt yet taken lovenox last evening prior to fall (so last lovenox was evening of 7/25).  CT head and c-spine neg.   Review of Systems: As per HPI, otherwise all review of systems negative.  Past Medical History:  Diagnosis Date   A-fib ([place])    Acid reflux    Anxiety    Arthritis    Chronic anticoagulation 03/07/2013   Lovenox 100 QD   Chronic kidney disease    Stage III   CVA (cerebral infarction) 1964   Dementia [facility])    Dr Dohmeier   Diverticulosis    DVT (deep venous thrombosis) ([place]) 2007   recurrent w PE s/p [place] filter   GERD (gastroesophageal reflux disease)    w hiatal hernia endoscopy 2003, Dr Penelope Coop   Glaucoma    Hypercholesterolemia    Hypertension    Hypoglycemia    Hypothyroidism    goiter   IBS (irritable bowel syndrome)    Junctional bradycardia    resolved with discontinuation of sotalol (2014) after 10 yrs   Memory loss    Migraine    Osteoarthritis    in Bilateral knees and back. -Dr Latanya Maudlin   Osteoporosis    Panic attacks    Phlebitis    Postherpetic  trigeminal neuralgia 10/20/2012   Pulmonary embolism recurrent    IVC filter   Shingles    zoster 1990 on the back and on the abdomen in 2007,facial zoster 2006   Trigeminal neuralgia    r face--Dr Dohmeier   Unspecified cerebral artery occlusion with cerebral infarction    Vitamin D deficiency     Past Surgical History:  Procedure Laterality Date   APPENDECTOMY  1945   biopsies of breast Bilateral 1971   blood clots  06/2006   lower abdomen   CARDIAC CATHETERIZATION  08/2001   A-fib/Palpitations Dr. Daneen Schick   CHOLECYSTECTOMY  01/1983   DILATION AND CURETTAGE, DIAGNOSTIC / THERAPEUTIC  1952/1977   x2   FOOT SURGERY Right 1978   Cyst removed from [place]   insertion of vena cova filter  2007   LEG / ANKLE SOFT TISSUE BIOPSY Right    precancerous   migraine speech impairment  07/1963   phlebitis  12/2005   PULMONARY EMBOLISM SURGERY  03/2005   TONSILLECTOMY     1954     reports that she has never smoked. She has never used smokeless tobacco. She reports that she does not drink alcohol or use drugs.  Allergies  Allergen  Reactions   Benadryl [Diphenhydramine Hcl] Hives and Shortness Of Breath   Eye Drops [Tetrahydrozoline Hcl] Other (See Comments)    Burning, itching, swelling   Flu Virus Vaccine Shortness Of Breath   Iohexol Hives and Shortness Of Breath     Code: HIVES, Desc: PT IS ALLERGIC TO IVP DYE/IODINE/SHRIMP.  CAUSES SOB AND HIVES PER PT.  STEPHANIE, RT-R, Onset Date: [id number]    Shrimp [Shellfish Allergy] Anaphylaxis   Voltaren [Diclofenac Sodium] Other (See Comments)    Acid reflux symptoms   Ambien [Zolpidem Tartrate] Hives   Gluten Meal Diarrhea   Travatan Z [Travoprost] Other (See Comments)    BRAND NAME ONLY OLCANNOT TOLERATE GENERIC FORM OF EYE DROPS, IT CONTAINS PRESERVATIVES-SENSITIVITY   Ativan [Lorazepam]     delusional   Hydrocodone     Only on the higher  dosages   Betadine Antibiotic-Moisturize [Bacitracin-Polymyxin B] Rash   Biaxin [Clarithromycin] Hives   Celebrex [Celecoxib] Rash   Ciprofloxacin Hcl Rash        Combigan [Brimonidine Tartrate-Timolol] Other (See Comments)    unknown   Coumadin [Warfarin Sodium] Rash   Ivp Dye [Iodinated Diagnostic Agents] Hives   Levaquin [Levofloxacin In D5w] Rash   Naprosyn [Naproxen] Rash   Pataday [Olopatadine Hcl] Other (See Comments)   Penicillins Hives    Has patient had a PCN reaction causing immediate rash, facial/tongue/throat swelling, SOB or lightheadedness with hypotension: Yes Has patient had a PCN reaction causing severe rash involving mucus membranes or skin necrosis: No Has patient had a PCN reaction that required hospitalization No Has patient had a PCN reaction occurring within the last 10 years: No If all of the above answers are "NO", then may proceed with Cephalosporin use.    Plavix [Clopidogrel Bisulfate] Rash   Septra [Sulfamethoxazole-Trimethoprim] Hives    Family History  Problem Relation Age of Onset   Diabetes Mother    Hypertension Mother    Glaucoma Mother    CVA Mother    CAD Mother    Hypertension Father    Heart attack Father    Kidney failure Father    Heart attack Brother        2 half brothers   Hypertension Brother    CVA Brother    Glaucoma Other    Glaucoma Other    Prostate cancer Maternal Grandfather    Breast cancer Maternal Aunt    Colon cancer Maternal Uncle    Rectal cancer Maternal Uncle      Prior to Admission medications   Medication Sig Start Date End Date Taking? Authorizing Provider  amLODipine (NORVASC) 2.5 MG tablet Take 1 tablet (2.5 mg total) by mouth daily. Please make overdue appt for future refills. [phone]. 2nd attempt 05/20/18  Yes Belva Crome, MD  CARBATROL 100 MG 12 hr capsule TAKE 2 CAPSULES BY MOUTH 2 TIMES DAILY Patient taking differently: Take 200 mg by mouth 2 (two) times  daily.  07/09/18  Yes Jaffe, Adam R, DO  cholecalciferol (VITAMIN D) 1000 units tablet Take 1,000 Units by mouth daily.   Yes [provider]  enoxaparin (LOVENOX) 100 MG/ML injection Inject 100 mg into the skin at bedtime.    Yes [provider]  EPINEPHrine (EPIPEN 2-PAK) 0.3 mg/0.3 mL IJ SOAJ injection Inject 0.3 mg into the muscle as needed for anaphylaxis.   Yes [provider]  fluticasone (FLONASE) 50 MCG/ACT nasal spray Place 1 spray into left nostril daily as needed for allergies or rhinitis.  06/27/14  Yes [provider]  folic acid (FOLVITE) 1 MG tablet Take 1 mg by mouth daily.    Yes [provider]  [name] 300 MG TABS TAKE 1 TABLET BY MOUTH 2 TIMES DAILY Patient taking differently: Take 1 tablet by mouth 2 (two) times a day.  12/23/17  Yes Jaffe, Adam R, DO  levothyroxine (SYNTHROID, LEVOTHROID) 75 MCG tablet Take 75 mcg by mouth daily before breakfast.    Yes [provider]  loperamide (IMODIUM A-D) 2 MG tablet Take 2 mg by mouth 4 (four) times daily as needed for diarrhea or loose stools.   Yes [provider]  loratadine (CLARITIN) 10 MG tablet Take 10 mg by mouth daily.   Yes [provider]  meclizine (ANTIVERT) 25 MG tablet Take 25 mg by mouth 2 (two) times daily as needed for dizziness or nausea.  02/18/14  Yes [provider]  Multiple Vitamin (MULTIVITAMIN WITH MINERALS) TABS tablet Take 1 tablet by mouth daily.   Yes [provider]  NAMZARIC 28-10 MG CP24 TAKE 1 CAPSULE BY MOUTH EVERY DAY Patient taking differently: Take 1 capsule by mouth daily.  12/30/16  Yes Jaffe, Adam R, DO  pravastatin (PRAVACHOL) 40 MG tablet Take 40 mg by mouth daily.    Yes [provider]  RABEprazole (ACIPHEX) 20 MG tablet Take 20 mg by mouth daily.    Yes [provider]  ROCKLATAN 0.02-0.005 % SOLN Place 1 drop into both eyes at bedtime. 10/09/18  Yes [provider]  sodium  chloride 1 G tablet Take 1 tablet (1 g total) by mouth 2 (two) times daily with a meal. 08/07/13  Yes Donne Hazel, MD    Physical Exam: Vitals:   10/19/18 0331 10/19/18 0345 10/19/18 0400 10/19/18 0445  BP: (!) 150/61 (!) 147/55 (!) 141/55 (!) 133/52  Pulse: 71 75 71 71  Resp: 20 17 18 19   Temp:      TempSrc:      SpO2: 95% 92% 92% 92%  Weight:      Height:        Constitutional: NAD, calm, comfortable Eyes: PERRL, lids and conjunctivae normal ENMT: Mucous membranes are moist. Posterior pharynx clear of any exudate or lesions.Normal dentition.  Neck: normal, supple, no masses, no thyromegaly Respiratory: clear to auscultation bilaterally, no wheezing, no crackles. Normal respiratory effort. No accessory muscle use.  Cardiovascular: Regular rate and rhythm, no murmurs / rubs / gallops. No extremity edema. 2+ pedal pulses. No carotid bruits.  Abdomen: no tenderness, no masses palpated. No hepatosplenomegaly. Bowel sounds positive.  Musculoskeletal: R knee swelling and pain with ROM Skin: no rashes, lesions, ulcers. No induration Neurologic: CN 2-12 grossly intact. Sensation intact, DTR normal. Strength 5/5 in all 4.  Psychiatric: Normal judgment and insight. Alert and oriented x 3. Normal mood.    Labs on Admission: I have personally reviewed following labs and imaging studies  CBC: Recent Labs  Lab 10/19/18 0317  WBC 11.1*  NEUTROABS 8.1*  HGB 9.1*  HCT 28.6*  MCV 83.1  PLT 332*   Basic Metabolic Panel: Recent Labs  Lab 10/19/18 0317  NA 138  K 4.1  CL 107  CO2 19*  GLUCOSE 110*  BUN 24*  CREATININE 1.37*  CALCIUM 8.8*   GFR: Estimated Creatinine Clearance: 32.8 mL/min (A) (by C-G formula based on SCr of 1.37 mg/dL (H)). Liver Function Tests: No results for input(s): AST, ALT, ALKPHOS, BILITOT, PROT, ALBUMIN in the last 168 hours. No results  for input(s): LIPASE, AMYLASE in the last 168 hours. No results for input(s): AMMONIA in the last 168  hours. Coagulation Profile: Recent Labs  Lab 10/19/18 0317  INR 1.2   Cardiac Enzymes: No results for input(s): CKTOTAL, CKMB, CKMBINDEX, TROPONINI in the last 168 hours. BNP (last 3 results) No results for input(s): PROBNP in the last 8760 hours. HbA1C: No results for input(s): HGBA1C in the last 72 hours. CBG: No results for input(s): GLUCAP in the last 168 hours. Lipid Profile: No results for input(s): CHOL, HDL, LDLCALC, TRIG, CHOLHDL, LDLDIRECT in the last 72 hours. Thyroid Function Tests: No results for input(s): TSH, T4TOTAL, FREET4, T3FREE, THYROIDAB in the last 72 hours. Anemia Panel: No results for input(s): VITAMINB12, FOLATE, FERRITIN, TIBC, IRON, RETICCTPCT in the last 72 hours. Urine analysis:    Component Value Date/Time   COLORURINE YELLOW 12/03/2013 1024   APPEARANCEUR HAZY (A) 12/03/2013 1024   LABSPEC 1.008 12/03/2013 1024   PHURINE 7.5 12/03/2013 1024   GLUCOSEU NEGATIVE 12/03/2013 1024   HGBUR NEGATIVE 12/03/2013 1024   BILIRUBINUR NEGATIVE 12/03/2013 1024   KETONESUR NEGATIVE 12/03/2013 1024   PROTEINUR NEGATIVE 12/03/2013 1024   UROBILINOGEN 0.2 12/03/2013 1024   NITRITE POSITIVE (A) 12/03/2013 1024   LEUKOCYTESUR SMALL (A) 12/03/2013 1024    Radiological Exams on Admission: Ct Head Wo Contrast  Result Date: 10/18/2018 CLINICAL DATA:  Fall hit left side of head EXAM: CT HEAD WITHOUT CONTRAST CT CERVICAL SPINE WITHOUT CONTRAST TECHNIQUE: Multidetector CT imaging of the head and cervical spine was performed following the standard protocol without intravenous contrast. Multiplanar CT image reconstructions of the cervical spine were also generated. COMPARISON:  CT brain and cervical spine 05/10/2015 FINDINGS: CT HEAD FINDINGS Brain: No acute territorial infarction, hemorrhage or intracranial mass. Mild atrophy. Moderate small vessel ischemic changes of the white matter. Chronic lacunar infarct left cerebellum. Chronic lacunar infarct left basal ganglia and  white matter. Slight ventricular enlargement, likely due to progression of atrophy. Vascular: No hyperdense vessels.  Carotid vascular calcification Skull: Normal. Negative for fracture or focal lesion. Sinuses/Orbits: No acute finding. Other: None CT CERVICAL SPINE FINDINGS Alignment: No subluxation.  Facet alignment within normal limits. Skull base and vertebrae: No acute fracture. No primary bone lesion or focal pathologic process. Soft tissues and spinal canal: No prevertebral fluid or swelling. No visible canal hematoma. Disc levels:  Mild degenerative changes at C4-C5 and C7-T1. Upper chest: Lung apices are clear. Other: None IMPRESSION: 1. No CT evidence for acute intracranial abnormality. Atrophy and small vessel ischemic changes of the white matter 2. No acute osseous abnormality of the cervical spine Electronically Signed   By: Donavan Foil M.D.   On: 10/18/2018 23:22   Ct Cervical Spine Wo Contrast  Result Date: 10/18/2018 CLINICAL DATA:  Fall hit left side of head EXAM: CT HEAD WITHOUT CONTRAST CT CERVICAL SPINE WITHOUT CONTRAST TECHNIQUE: Multidetector CT imaging of the head and cervical spine was performed following the standard protocol without intravenous contrast. Multiplanar CT image reconstructions of the cervical spine were also generated. COMPARISON:  CT brain and cervical spine 05/10/2015 FINDINGS: CT HEAD FINDINGS Brain: No acute territorial infarction, hemorrhage or intracranial mass. Mild atrophy. Moderate small vessel ischemic changes of the white matter. Chronic lacunar infarct left cerebellum. Chronic lacunar infarct left basal ganglia and white matter. Slight ventricular enlargement, likely due to progression of atrophy. Vascular: No hyperdense vessels.  Carotid vascular calcification Skull: Normal. Negative for fracture or focal lesion. Sinuses/Orbits: No acute finding. Other: None CT  CERVICAL SPINE FINDINGS Alignment: No subluxation.  Facet alignment within normal limits. Skull  base and vertebrae: No acute fracture. No primary bone lesion or focal pathologic process. Soft tissues and spinal canal: No prevertebral fluid or swelling. No visible canal hematoma. Disc levels:  Mild degenerative changes at C4-C5 and C7-T1. Upper chest: Lung apices are clear. Other: None IMPRESSION: 1. No CT evidence for acute intracranial abnormality. Atrophy and small vessel ischemic changes of the white matter 2. No acute osseous abnormality of the cervical spine Electronically Signed   By: Donavan Foil M.D.   On: 10/18/2018 23:22   Dg Knee Complete 4 Views Right  Result Date: 10/18/2018 CLINICAL DATA:  Initial evaluation for acute trauma, fall. EXAM: RIGHT KNEE - COMPLETE 4+ VIEW COMPARISON:  None. FINDINGS: Focal cortical irregularity seen at the medial femoral condyle, suspicious for possible acute nondisplaced fracture. Associated large joint effusion within the suprapatellar recess. Advanced degenerative osteoarthritic changes seen about the knee, most notable at the patellofemoral and lateral femorotibial joint space compartments. Underlying osteopenia. Overlying soft tissues within normal limits. IMPRESSION: 1. Focal cortical irregularity at the medial femoral condyle, suspicious for possible acute nondisplaced fracture. Correlation with physical exam recommended. 2. Associated large joint effusion. 3. Advanced degenerative osteoarthrosis. Electronically Signed   By: Jeannine Boga M.D.   On: 10/18/2018 21:28    EKG: Ordered and pending  Assessment/Plan Principal Problem:   Closed fracture of medial condyle of distal femur (HCC) Active Problems:   PAF (paroxysmal atrial fibrillation) (HCC)   Essential hypertension   SIADH (syndrome of inappropriate ADH production) (HCC)   Cognitive impairment    1. Right distal femur medial condyle fracture - 1. Hip fx pathway 2. Dr. Mayer Camel to see this AM and decide on timing of surgery 3. NPO incase he wants to do today 4. Patient able to  climb stairs at rehab with no recent CP nor SOB 5. Obtaining CXR and EKG to complete the pre-op clearance 2. PAF - h/o multiple DVTs and PEs 1. [place] filter in place 2. Of note, hadnt yet taken lovenox last evening prior to fall (so last lovenox was evening of 7/25). 3. Continue to hold lovenox for now as she needs surgery 4. Resume lovenox as soon as safe to do so after surgery 3. HTN - continue amlodipine 4. Mild dementia - Continue home meds 5. SIADH - 1. Continue salt tabs with meals 2. Fluid restriction diet when shes eating again 3. BMP tomorrow AM  DVT prophylaxis: SCDs Code Status: Full Family Communication: No family in room Disposition Plan: TBD Consults called: Dr. Mayer Camel Admission status: Admit to inpatient  Severity of Illness: The appropriate patient status for this patient is INPATIENT. Inpatient status is judged to be reasonable and necessary in order to provide the required intensity of service to ensure the patient's safety. The patient's presenting symptoms, physical exam findings, and initial radiographic and laboratory data in the context of their chronic comorbidities is felt to place them at high risk for further clinical deterioration. Furthermore, it is not anticipated that the patient will be medically stable for discharge from the hospital within 2 midnights of admission. The following factors support the patient status of inpatient.   IP status for surgical repair of femur fracture.   * I certify that at the point of admission it is my clinical judgment that the patient will require inpatient hospital care spanning beyond 2 midnights from the point of admission due to high intensity of service, high risk  for further deterioration and high frequency of surveillance required.*    Shalissa Easterwood M. DO [facility]  How to contact the [facility] Attending or Consulting provider [place] or covering provider during after hours [place], for this patient?   1. Check the care team in [facility] and look for a) attending/consulting [facility] provider listed and b) the [facility] team listed 2. Log into [email or web address]  Amion Physician Scheduling and messaging for groups and whole hospitals  On call and physician scheduling software for group practices, residents, hospitalists and other medical providers for call, clinic, rotation and shift schedules. OnCall [employer] is a hospital-wide system for scheduling doctors and paging doctors on call. EasyPlot is for scientific plotting and data analysis.  [email or web address]  and use [place]'s universal password to access. If you do not have the password, please contact the hospital operator.  3. Locate the [facility] provider you are looking for under [facility] and page to a number that you can be directly reached. 4. If you still have difficulty reaching the provider, please page the [facility] (Director on Call) for the Hospitalists listed on amion for assistance.  10/19/2018, 5:41 AM

## 2018-10-19 NOTE — Progress Notes (Signed)
Orthopedic Tech Progress Note Patient Details:  [name] 1929/06/15 [id number]  Ortho Devices Type of Ortho Device: Knee Immobilizer Ortho Device/Splint Location: rle Ortho Device/Splint Interventions: Application, Adjustment, Ordered   Post Interventions Patient Tolerated: Well Instructions Provided: Care of device, Adjustment of device   Karolee Stamps 10/19/2018, 3:34 AM

## 2018-10-19 NOTE — Progress Notes (Signed)
Initial Nutrition Assessment  DOCUMENTATION CODES:   Obesity unspecified  INTERVENTION:  Provide Ensure Enlive po once daily, each supplement provides 350 kcal and 20 grams of protein.  Encourage adequate PO intake.   NUTRITION DIAGNOSIS:   Increased nutrient needs related to post-op healing as evidenced by estimated needs.  GOAL:   Patient will meet greater than or equal to 90% of their needs  MONITOR:   PO intake, Supplement acceptance, Skin, Weight trends, Labs, I & O's  REASON FOR ASSESSMENT:   Consult Hip fracture protocol  ASSESSMENT:   85 y.o. 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 Pt with fracture of medial condyle of distal R femur with large joint effusion and hemorrhage into joint. Plans for non-operative treatment.   Meal completion 100%. Pt reports having a good appetite currently and PTA with usual consumption of at least 3 meals a day. Usual body weight unknown. RD to order nutritional supplements to aid in post op healing. Pt encouraged to eat her food at meals and to drink her supplement.   NUTRITION - FOCUSED PHYSICAL EXAM:    Most Recent Value  Orbital Region  Unable to assess  Upper Arm Region  No depletion  Thoracic and Lumbar Region  No depletion  Buccal Region  Unable to assess  Temple Region  Unable to assess  Clavicle Bone Region  No depletion  Clavicle and Acromion Bone Region  No depletion  Scapular Bone Region  No depletion  Dorsal Hand  Unable to assess  Patellar Region  No depletion  Anterior Thigh Region  No depletion  Posterior Calf Region  No depletion  Edema (RD Assessment)  Mild  Hair  Reviewed  Eyes  Reviewed  Mouth  Reviewed  Skin  Reviewed  Nails  Reviewed     Labs and medications reviewed.   Diet Order:   Diet Order            Diet Heart Room service appropriate? Yes; Fluid consistency: Thin  Diet  effective now              EDUCATION NEEDS:   Not appropriate for education at this time  Skin:  Skin Assessment: Reviewed RN Assessment  Last BM:  7/25  Height:   Ht Readings from Last 1 Encounters:  10/18/18 5\' 6"  (1.676 m)    Weight:   Wt Readings from Last 1 Encounters:  10/18/18 90.7 kg    Ideal Body Weight:  59 kg  BMI:  Body mass index is 32.28 kg/m.  Estimated Nutritional Needs:   Kcal:  1650-1800  Protein:  75-85 grams  Fluid:  1.6 - 1.8 L/day    Corrin Parker, MS, RD, LDN Pager # [phone] After hours/ weekend pager # [phone]

## 2018-10-19 NOTE — Progress Notes (Signed)
BP 129/43. Karie Kirks, MD paged. Awaiting MD response. Will continue to monitor.

## 2018-10-19 NOTE — Progress Notes (Signed)
PROGRESS NOTE  DEVINE DANT OVF:[id number] DOB: 09-13-1929 DOA: 10/18/2018 PCP: [name], MD  Brief History   [name] is a 85 y.o. 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  Patient presents to the ED at [facility] following a mechanical fall.  She was standing at her fridge at the [facility], turned around, legs got tangled.  Landed on L side.  L sided headache as well as pain to posterior RIGHT knee.  Hasnt ambulated since fall.    No numbness, weakness, vision changes, back pain, neck pain.  ED Course: fracture of medial condyle of distal R femur with large joint effusion and hemorrhage into joint.  Of note, hadnt yet taken lovenox last evening prior to fall (so last lovenox was evening of 7/25).  CT head and c-spine neg.  [facility] was consulted to admit the patient for further evaluation and treatment. Orthopedic surgery was consulted. They have determined that the patient is not a candidate for surgery. They have recommended immobilization and non-weight bearing status. She will need placement.  Consultants  . Orthopedic surgery  Procedures  None  Antibiotics   Anti-infectives (From admission, onward)   None    .  Subjective  The patient is resting comfortably. No new complaints.  Objective   Vitals:  Vitals:   10/19/18 1132 10/19/18 1642  BP: (!) 129/43 (!) 161/61  Pulse: 76 79  Resp: 16 16  Temp: 98.5 F (36.9 C) 98.5 F (36.9 C)  SpO2: 93% 93%    Exam:  Constitutional:  . The patient is awake and alert. No acute distress. Respiratory:  . No increased work of breathing. . No wheezes, rales, or rhonchi . No tactile fremitus Cardiovascular:  . Regular rate and rhythm . No murmurs, ectopy, or gallups . No lateral PMI. No thrills. Abdomen:  . Abdomen is soft, non-tender, non-distended. . No hernias, masses, or hernias are appreciated. .  Normoactive bowel sounds. Musculoskeletal:  . No cyanosis, clubbing, or edema Skin:  . No rashes, lesions, ulcers . palpation of skin: no induration or nodules Neurologic:  . CN 2-12 intact . Sensation all 4 extremities intact Psychiatric:  . Mental status o Mood, affect appropriate o Orientation to person, place, time  . judgment and insight appear intact     I have personally reviewed the following:   Today's Data  . Vitals, CBC, BMP  Imaging  . CT Rt knee: Acute minimally displaced fracture of the medial femoral condyle with the fracture cleft extending to the lasteral aspect of the intercondylar notch and the articular surgace of the medial trochlear. Large hemarthrosis. Tricompartmental osteoarthritis.  Scheduled Meds: . amLODipine  2.5 mg Oral Daily  . carbamazepine  200 mg Oral BID  . cholecalciferol  1,000 Units Oral Daily  . memantine  28 mg Oral Daily   And  . donepezil  10 mg Oral Daily  . feeding supplement (ENSURE ENLIVE)  237 mL Oral Q1500  . folic acid  1 mg Oral Daily  . gabapentin  300 mg Oral BID  . latanoprost  1 drop Both Eyes QHS  . levothyroxine  75 mcg Oral Q0600  . loratadine  10 mg Oral Daily  . multivitamin with minerals  1 tablet Oral Daily  . pantoprazole  40 mg Oral Daily  . pravastatin  40 mg Oral q1800  . sodium chloride  1 g Oral BID WC  Continuous Infusions:  Principal Problem:   Closed fracture of medial condyle of distal femur (HCC) Active Problems:   PAF (paroxysmal atrial fibrillation) (HCC)   Essential hypertension   SIADH (syndrome of inappropriate ADH production) (HCC)   Cognitive impairment   LOS: 0 days    A & P  Closed fracture of the medial condyle of distal femur: Non-operable. Orthopedic surgery recommends knee immobilizer and non-weight bearing status.  Paroxysmal Atrial Fibrillation: Rate is controlled without rate limiting medication. Continue Lovenox for stroke prophylaxic.  History of DVT/PR: Continue  Lovenox as outpatient. Pt has an AVC filter.  Hypertension: Poor control on Norvasc alone. Start Hydralazine.  Dementia: Continue aricept and donepezil  SIADH: Monitor Sodium and volume status.   DVT prophylaxis: Lovenox Code Status: Full Code Family Communication: None available Disposition Plan: Rehab/SNF   Liborio Saccente, DO [facility] Direct contact: see [email or web address]  7PM-7AM contact night coverage as above 10/19/2018, 7:23 PM  LOS: 0 days

## 2018-10-19 NOTE — Plan of Care (Signed)
Problem: Education: Goal: Knowledge of General Education information will improve Description: Including pain rating scale, medication(s)/side effects and non-pharmacologic comfort measures Outcome: Progressing   Problem: Health Behavior/Discharge Planning: Goal: Ability to manage health-related needs will improve Outcome: Progressing   Problem: Clinical Measurements: Goal: Ability to maintain clinical measurements within normal limits will improve Outcome: Progressing  Goal: Respiratory complications will improve Outcome: Progressing  Goal: Cardiovascular complication will be avoided Outcome: Progressing   Problem: Nutrition: Goal: Adequate nutrition will be maintained Outcome: Progressing   Problem: Coping: Goal: Level of anxiety will decrease Outcome: Progressing   Problem: Elimination: Goal: Will not experience complications related to urinary retention Outcome: Progressing   Problem: Pain Managment: Goal: General experience of comfort will improve Outcome: Progressing   Problem: Safety: Goal: Ability to remain free from injury will improve Outcome: Progressing   Problem: Skin Integrity: Goal: Risk for impaired skin integrity will decrease Outcome: Progressing   

## 2018-10-19 NOTE — ED Notes (Signed)
ED TO INPATIENT HANDOFF REPORT  ED Nurse Name and Phone #: Brie [phone]  S Name/Age/Gender [name] 85 y.o. female Room/Bed: 022C/022C  Code Status   Code Status: Prior  Home/SNF/Other Skilled nursing facility Patient oriented to: self Is this baseline? Yes   Triage Complete: Triage complete  Chief Complaint FALL  Triage Note Pt brought in by EMS s/p fall at assisted living.  Pt states she was standing at her fridge and turned around and legs got tangled resulting in fall.  Pt states she hit left side of head as well as right knee and right foot.  No deformity noted   Allergies Allergies  Allergen Reactions  . Benadryl [Diphenhydramine Hcl] Hives and Shortness Of Breath  . Eye Drops [Tetrahydrozoline Hcl] Other (See Comments)    Burning, itching, swelling  . Flu Virus Vaccine Shortness Of Breath  . Iohexol Hives and Shortness Of Breath     Code: HIVES, Desc: PT IS ALLERGIC TO IVP DYE/IODINE/SHRIMP.  CAUSES SOB AND HIVES PER PT.  STEPHANIE, RT-R, Onset Date: [id number]   . Shrimp [Shellfish Allergy] Anaphylaxis  . Voltaren [Diclofenac Sodium] Other (See Comments)    Acid reflux symptoms  . Ambien [Zolpidem Tartrate] Hives  . Gluten Meal Diarrhea  . Travatan Z [Travoprost] Other (See Comments)    BRAND NAME ONLY OLCANNOT TOLERATE GENERIC FORM OF EYE DROPS, IT CONTAINS PRESERVATIVES-SENSITIVITY  . Ativan [Lorazepam]     delusional  . Hydrocodone     Only on the higher dosages  . Betadine Antibiotic-Moisturize [Bacitracin-Polymyxin B] Rash  . Biaxin [Clarithromycin] Hives  . Celebrex [Celecoxib] Rash  . Ciprofloxacin Hcl Rash       . Combigan [Brimonidine Tartrate-Timolol] Other (See Comments)    unknown  . Coumadin [Warfarin Sodium] Rash  . Ivp Dye [Iodinated Diagnostic Agents] Hives  . Levaquin [Levofloxacin In D5w] Rash  . Naprosyn [Naproxen] Rash  . Pataday [Olopatadine Hcl] Other (See Comments)  . Penicillins Hives    Has patient had a PCN reaction  causing immediate rash, facial/tongue/throat swelling, SOB or lightheadedness with hypotension: Yes Has patient had a PCN reaction causing severe rash involving mucus membranes or skin necrosis: No Has patient had a PCN reaction that required hospitalization No Has patient had a PCN reaction occurring within the last 10 years: No If all of the above answers are "NO", then may proceed with Cephalosporin use.   Marland Kitchen Plavix [Clopidogrel Bisulfate] Rash  . Septra [Sulfamethoxazole-Trimethoprim] Hives    Level of Care/Admitting Diagnosis ED Disposition    None      B Medical/Surgery History Past Medical History:  Diagnosis Date  . A-fib ([place])   . Acid reflux   . Anxiety   . Arthritis   . Chronic anticoagulation 03/07/2013   Lovenox 100 QD  . Chronic kidney disease    Stage III  . CVA (cerebral infarction) 1964  . Dementia [facility])    Dr Brett Fairy  . Diverticulosis   . DVT (deep venous thrombosis) ([place]) 2007   recurrent w PE s/p [place] filter  . GERD (gastroesophageal reflux disease)    w hiatal hernia endoscopy 2003, Dr Penelope Coop  . Glaucoma   . Hypercholesterolemia   . Hypertension   . Hypoglycemia   . Hypothyroidism    goiter  . IBS (irritable bowel syndrome)   . Junctional bradycardia    resolved with discontinuation of sotalol (2014) after 10 yrs  . Memory loss   . Migraine   . Osteoarthritis  in Bilateral knees and back. -Dr Latanya Maudlin  . Osteoporosis   . Panic attacks   . Phlebitis   . Postherpetic trigeminal neuralgia 10/20/2012  . Pulmonary embolism recurrent    IVC filter  . Shingles    zoster 1990 on the back and on the abdomen in 2007,facial zoster 2006  . Trigeminal neuralgia    r face--Dr Dohmeier  . Unspecified cerebral artery occlusion with cerebral infarction   . Vitamin D deficiency    Past Surgical History:  Procedure Laterality Date  . APPENDECTOMY  1945  . biopsies of breast Bilateral 1971  . blood clots  06/2006   lower abdomen  . CARDIAC  CATHETERIZATION  08/2001   A-fib/Palpitations Dr. Daneen Schick  . CHOLECYSTECTOMY  01/1983  . DILATION AND CURETTAGE, DIAGNOSTIC / THERAPEUTIC  1952/1977   x2  . FOOT SURGERY Right 1978   Cyst removed from Right Foot  . [place]  . [place]  . insertion of vena cova filter  2007  . LEG / ANKLE SOFT TISSUE BIOPSY Right    precancerous  . migraine speech impairment  07/1963  . phlebitis  12/2005  . PULMONARY EMBOLISM SURGERY  03/2005  . TONSILLECTOMY     1954     A IV Location/Drains/Wounds Patient Lines/Drains/Airways Status   Active Line/Drains/Airways    Name:   Placement date:   Placement time:   Site:   Days:   Peripheral IV 10/19/18 Left Antecubital   10/19/18    0130    Antecubital   less than 1          Intake/Output Last 24 hours No intake or output data in the 24 hours ending 10/19/18 0454  Labs/Imaging Results for orders placed or performed during the hospital encounter of 10/18/18 (from the past 48 hour(s))  SARS Coronavirus 2 (CEPHEID - Performed in [place] hospital lab), Hosp Order     Status: None   Collection Time: 10/19/18  2:42 AM   Specimen: Nasopharyngeal Swab  Result Value Ref Range   SARS Coronavirus 2 NEGATIVE NEGATIVE    Comment: (NOTE) If result is NEGATIVE SARS-CoV-2 target nucleic acids are NOT DETECTED. The SARS-CoV-2 RNA is generally detectable in upper and lower  respiratory specimens during the acute phase of infection. The lowest  concentration of SARS-CoV-2 viral copies this assay can detect is 250  copies / mL. A negative result does not preclude SARS-CoV-2 infection  and should not be used as the sole basis for treatment or other  patient management decisions.  A negative result may occur with  improper specimen collection / handling, submission of specimen other  than nasopharyngeal swab, presence of viral mutation(s) within the  areas targeted by this assay, and inadequate number of viral copies   (<250 copies / mL). A negative result must be combined with clinical  observations, patient history, and epidemiological information. If result is POSITIVE SARS-CoV-2 target nucleic acids are DETECTED. The SARS-CoV-2 RNA is generally detectable in upper and lower  respiratory specimens dur ing the acute phase of infection.  Positive  results are indicative of active infection with SARS-CoV-2.  Clinical  correlation with patient history and other diagnostic information is  necessary to determine patient infection status.  Positive results do  not rule out bacterial infection or co-infection with other viruses. If result is PRESUMPTIVE POSTIVE SARS-CoV-2 nucleic acids MAY BE PRESENT.   A presumptive positive result was obtained on the submitted specimen  and  confirmed on repeat testing.  While 2019 novel coronavirus  (SARS-CoV-2) nucleic acids may be present in the submitted sample  additional confirmatory testing may be necessary for epidemiological  and / or clinical management purposes  to differentiate between  SARS-CoV-2 and other Sarbecovirus currently known to infect humans.  If clinically indicated additional testing with an alternate test  methodology [phone]) is advised. The SARS-CoV-2 RNA is generally  detectable in upper and lower respiratory sp ecimens during the acute  phase of infection. The expected result is Negative. Fact Sheet for Patients:  StrictlyIdeas.no Fact Sheet for Healthcare Providers: BankingDealers.co.za This test is not yet approved or cleared by the [place] FDA and has been authorized for detection and/or diagnosis of SARS-CoV-2 by FDA under an Emergency Use Authorization (EUA).  This EUA will remain in effect (meaning this test can be used) for the duration of the COVID-19 declaration under Section 564(b)(1) of the Act, 21 U.S.C. section 360bbb-3(b)(1), unless the authorization is terminated  or revoked sooner. Performed at [facility], [place] [address] [place], [place] [address]   CBC with Differential     Status: Abnormal   Collection Time: 10/19/18  3:17 AM  Result Value Ref Range   WBC 11.1 (H) 4.0 - 10.5 K/uL   RBC 3.44 (L) 3.87 - 5.11 MIL/uL   Hemoglobin 9.1 (L) 12.0 - 15.0 g/dL   HCT 28.6 (L) 36.0 - 46.0 %   MCV 83.1 80.0 - 100.0 fL   MCH 26.5 26.0 - 34.0 pg   MCHC 31.8 30.0 - 36.0 g/dL   RDW 17.2 (H) 11.5 - 15.5 %   Platelets 101 (L) 150 - 400 K/uL    Comment: REPEATED TO VERIFY PLATELET COUNT CONFIRMED BY SMEAR SPECIMEN CHECKED FOR CLOTS Immature Platelet Fraction may be clinically indicated, consider ordering this additional test [id number]    nRBC 0.0 0.0 - 0.2 %   Neutrophils Relative % 73 %   Neutro Abs 8.1 (H) 1.7 - 7.7 K/uL   Lymphocytes Relative 13 %   Lymphs Abs 1.4 0.7 - 4.0 K/uL   Monocytes Relative 12 %   Monocytes Absolute 1.4 (H) 0.1 - 1.0 K/uL   Eosinophils Relative 1 %   Eosinophils Absolute 0.1 0.0 - 0.5 K/uL   Basophils Relative 0 %   Basophils Absolute 0.0 0.0 - 0.1 K/uL   Immature Granulocytes 1 %   Abs Immature Granulocytes 0.05 0.00 - 0.07 K/uL    Comment: Performed at [facility], [place] [address] [place], [place] [address]  Basic metabolic panel     Status: Abnormal   Collection Time: 10/19/18  3:17 AM  Result Value Ref Range   Sodium 138 135 - 145 mmol/L   Potassium 4.1 3.5 - 5.1 mmol/L   Chloride 107 98 - 111 mmol/L   CO2 19 (L) 22 - 32 mmol/L   Glucose, Bld 110 (H) 70 - 99 mg/dL   BUN 24 (H) 8 - 23 mg/dL   Creatinine, Ser 1.37 (H) 0.44 - 1.00 mg/dL   Calcium 8.8 (L) 8.9 - 10.3 mg/dL   GFR calc non Af Amer 35 (L) >60 mL/min   GFR calc Af Amer 40 (L) >60 mL/min   Anion gap 12 5 - 15    Comment: Performed at [facility] [address], [place], [place] [address]  Protime-INR     Status: None   Collection Time: 10/19/18  3:17 AM  Result Value Ref Range  Prothrombin Time 15.1 11.4 - 15.2 seconds    INR 1.2 0.8 - 1.2    Comment: (NOTE) INR goal varies based on device and disease states. Performed at [facility], [place] [address], [place], [place] [address]    Ct Head Wo Contrast  Result Date: 10/18/2018 CLINICAL DATA:  Fall hit left side of head EXAM: CT HEAD WITHOUT CONTRAST CT CERVICAL SPINE WITHOUT CONTRAST TECHNIQUE: Multidetector CT imaging of the head and cervical spine was performed following the standard protocol without intravenous contrast. Multiplanar CT image reconstructions of the cervical spine were also generated. COMPARISON:  CT brain and cervical spine 05/10/2015 FINDINGS: CT HEAD FINDINGS Brain: No acute territorial infarction, hemorrhage or intracranial mass. Mild atrophy. Moderate small vessel ischemic changes of the white matter. Chronic lacunar infarct left cerebellum. Chronic lacunar infarct left basal ganglia and white matter. Slight ventricular enlargement, likely due to progression of atrophy. Vascular: No hyperdense vessels.  Carotid vascular calcification Skull: Normal. Negative for fracture or focal lesion. Sinuses/Orbits: No acute finding. Other: None CT CERVICAL SPINE FINDINGS Alignment: No subluxation.  Facet alignment within normal limits. Skull base and vertebrae: No acute fracture. No primary bone lesion or focal pathologic process. Soft tissues and spinal canal: No prevertebral fluid or swelling. No visible canal hematoma. Disc levels:  Mild degenerative changes at C4-C5 and C7-T1. Upper chest: Lung apices are clear. Other: None IMPRESSION: 1. No CT evidence for acute intracranial abnormality. Atrophy and small vessel ischemic changes of the white matter 2. No acute osseous abnormality of the cervical spine Electronically Signed   By: Donavan Foil M.D.   On: 10/18/2018 23:22   Ct Cervical Spine Wo Contrast  Result Date: 10/18/2018 CLINICAL DATA:  Fall hit left side of head EXAM: CT HEAD WITHOUT CONTRAST CT CERVICAL SPINE WITHOUT CONTRAST TECHNIQUE:  Multidetector CT imaging of the head and cervical spine was performed following the standard protocol without intravenous contrast. Multiplanar CT image reconstructions of the cervical spine were also generated. COMPARISON:  CT brain and cervical spine 05/10/2015 FINDINGS: CT HEAD FINDINGS Brain: No acute territorial infarction, hemorrhage or intracranial mass. Mild atrophy. Moderate small vessel ischemic changes of the white matter. Chronic lacunar infarct left cerebellum. Chronic lacunar infarct left basal ganglia and white matter. Slight ventricular enlargement, likely due to progression of atrophy. Vascular: No hyperdense vessels.  Carotid vascular calcification Skull: Normal. Negative for fracture or focal lesion. Sinuses/Orbits: No acute finding. Other: None CT CERVICAL SPINE FINDINGS Alignment: No subluxation.  Facet alignment within normal limits. Skull base and vertebrae: No acute fracture. No primary bone lesion or focal pathologic process. Soft tissues and spinal canal: No prevertebral fluid or swelling. No visible canal hematoma. Disc levels:  Mild degenerative changes at C4-C5 and C7-T1. Upper chest: Lung apices are clear. Other: None IMPRESSION: 1. No CT evidence for acute intracranial abnormality. Atrophy and small vessel ischemic changes of the white matter 2. No acute osseous abnormality of the cervical spine Electronically Signed   By: Donavan Foil M.D.   On: 10/18/2018 23:22   Dg Knee Complete 4 Views Right  Result Date: 10/18/2018 CLINICAL DATA:  Initial evaluation for acute trauma, fall. EXAM: RIGHT KNEE - COMPLETE 4+ VIEW COMPARISON:  None. FINDINGS: Focal cortical irregularity seen at the medial femoral condyle, suspicious for possible acute nondisplaced fracture. Associated large joint effusion within the suprapatellar recess. Advanced degenerative osteoarthritic changes seen about the knee, most notable at the patellofemoral and lateral femorotibial joint space compartments. Underlying  osteopenia. Overlying soft  tissues within normal limits. IMPRESSION: 1. Focal cortical irregularity at the medial femoral condyle, suspicious for possible acute nondisplaced fracture. Correlation with physical exam recommended. 2. Associated large joint effusion. 3. Advanced degenerative osteoarthrosis. Electronically Signed   By: Jeannine Boga M.D.   On: 10/18/2018 21:28    Pending Labs Unresulted Labs (From admission, onward)   None      Vitals/Pain Today's Vitals   10/19/18 0331 10/19/18 0333 10/19/18 0345 10/19/18 0400  BP: (!) 150/61  (!) 147/55 (!) 141/55  Pulse: 71  75 71  Resp: 20  17 18   Temp:      TempSrc:      SpO2: 95%  92% 92%  Weight:      Height:      PainSc:  6       Isolation Precautions No active isolations  Medications Medications  acetaminophen (TYLENOL) tablet 650 mg (650 mg Oral Given 10/18/18 2336)  fentaNYL (SUBLIMAZE) injection 50 mcg (50 mcg Intravenous Given 10/19/18 0130)  oxyCODONE-acetaminophen (PERCOCET/ROXICET) 5-325 MG per tablet 1 tablet (1 tablet Oral Given 10/19/18 8453)    Mobility walks with device High fall risk   Focused Assessments    R Recommendations: See Admitting Provider Note  Report given to:   Additional Notes:

## 2018-10-19 NOTE — ED Provider Notes (Signed)
Assumed care from [place] at shift change.  See prior note for full H&P.  Briefly 85 year old female on chronic anticoagulation with subcutaneous Lovenox, presenting to the ED after mechanical fall.  Has significant pain in the right knee and unable to bear weight.  Head and neck CT without any acute findings.  Initial knee films with questionable nondisplaced fracture of the medial femoral condyle.  Plan: CT of the knee pending for definitive imaging.  Results for orders placed or performed in visit on 06/22/15  Comprehensive metabolic panel  Result Value Ref Range   Sodium 136 135 - 145 mEq/L   Potassium 4.6 3.5 - 5.1 mEq/L   Chloride 104 96 - 112 mEq/L   CO2 25 19 - 32 mEq/L   Glucose, Bld 132 (H) 70 - 99 mg/dL   BUN 28 (H) 6 - 23 mg/dL   Creatinine, Ser 1.26 (H) 0.40 - 1.20 mg/dL   Total Bilirubin 0.3 0.2 - 1.2 mg/dL   Alkaline Phosphatase 78 39 - 117 U/L   AST 16 0 - 37 U/L   ALT 9 0 - 35 U/L   Total Protein 6.8 6.0 - 8.3 g/dL   Albumin 4.2 3.5 - 5.2 g/dL   Calcium 8.9 8.4 - 10.5 mg/dL   GFR 43.02 (L) >60.00 mL/min  CBC with Differential/Platelet  Result Value Ref Range   WBC 7.9 4.0 - 10.5 K/uL   RBC 3.62 (L) 3.87 - 5.11 Mil/uL   Hemoglobin 10.1 (L) 12.0 - 15.0 g/dL   HCT 30.1 (L) 36.0 - 46.0 %   MCV 83.3 78.0 - 100.0 fl   MCHC 33.5 30.0 - 36.0 g/dL   RDW 19.1 (H) 11.5 - 15.5 %   Platelets 164.0 150.0 - 400.0 K/uL   Neutrophils Relative % 69.2 43.0 - 77.0 %   Lymphocytes Relative 16.7 12.0 - 46.0 %   Monocytes Relative 10.7 3.0 - 12.0 %   Eosinophils Relative 3.1 0.0 - 5.0 %   Basophils Relative 0.3 0.0 - 3.0 %   Neutro Abs 5.4 1.4 - 7.7 K/uL   Lymphs Abs 1.3 0.7 - 4.0 K/uL   Monocytes Absolute 0.8 0.1 - 1.0 K/uL   Eosinophils Absolute 0.2 0.0 - 0.7 K/uL   Basophils Absolute 0.0 0.0 - 0.1 K/uL   Ct Head Wo Contrast  Result Date: 10/18/2018 CLINICAL DATA:  Fall hit left side of head EXAM: CT HEAD WITHOUT CONTRAST CT CERVICAL SPINE WITHOUT CONTRAST TECHNIQUE:  Multidetector CT imaging of the head and cervical spine was performed following the standard protocol without intravenous contrast. Multiplanar CT image reconstructions of the cervical spine were also generated. COMPARISON:  CT brain and cervical spine 05/10/2015 FINDINGS: CT HEAD FINDINGS Brain: No acute territorial infarction, hemorrhage or intracranial mass. Mild atrophy. Moderate small vessel ischemic changes of the white matter. Chronic lacunar infarct left cerebellum. Chronic lacunar infarct left basal ganglia and white matter. Slight ventricular enlargement, likely due to progression of atrophy. Vascular: No hyperdense vessels.  Carotid vascular calcification Skull: Normal. Negative for fracture or focal lesion. Sinuses/Orbits: No acute finding. Other: None CT CERVICAL SPINE FINDINGS Alignment: No subluxation.  Facet alignment within normal limits. Skull base and vertebrae: No acute fracture. No primary bone lesion or focal pathologic process. Soft tissues and spinal canal: No prevertebral fluid or swelling. No visible canal hematoma. Disc levels:  Mild degenerative changes at C4-C5 and C7-T1. Upper chest: Lung apices are clear. Other: None IMPRESSION: 1. No CT evidence for acute intracranial abnormality. Atrophy and  small vessel ischemic changes of the white matter 2. No acute osseous abnormality of the cervical spine Electronically Signed   By: Donavan Foil M.D.   On: 10/18/2018 23:22   Ct Cervical Spine Wo Contrast  Result Date: 10/18/2018 CLINICAL DATA:  Fall hit left side of head EXAM: CT HEAD WITHOUT CONTRAST CT CERVICAL SPINE WITHOUT CONTRAST TECHNIQUE: Multidetector CT imaging of the head and cervical spine was performed following the standard protocol without intravenous contrast. Multiplanar CT image reconstructions of the cervical spine were also generated. COMPARISON:  CT brain and cervical spine 05/10/2015 FINDINGS: CT HEAD FINDINGS Brain: No acute territorial infarction, hemorrhage or  intracranial mass. Mild atrophy. Moderate small vessel ischemic changes of the white matter. Chronic lacunar infarct left cerebellum. Chronic lacunar infarct left basal ganglia and white matter. Slight ventricular enlargement, likely due to progression of atrophy. Vascular: No hyperdense vessels.  Carotid vascular calcification Skull: Normal. Negative for fracture or focal lesion. Sinuses/Orbits: No acute finding. Other: None CT CERVICAL SPINE FINDINGS Alignment: No subluxation.  Facet alignment within normal limits. Skull base and vertebrae: No acute fracture. No primary bone lesion or focal pathologic process. Soft tissues and spinal canal: No prevertebral fluid or swelling. No visible canal hematoma. Disc levels:  Mild degenerative changes at C4-C5 and C7-T1. Upper chest: Lung apices are clear. Other: None IMPRESSION: 1. No CT evidence for acute intracranial abnormality. Atrophy and small vessel ischemic changes of the white matter 2. No acute osseous abnormality of the cervical spine Electronically Signed   By: Donavan Foil M.D.   On: 10/18/2018 23:22   Dg Knee Complete 4 Views Right  Result Date: 10/18/2018 CLINICAL DATA:  Initial evaluation for acute trauma, fall. EXAM: RIGHT KNEE - COMPLETE 4+ VIEW COMPARISON:  None. FINDINGS: Focal cortical irregularity seen at the medial femoral condyle, suspicious for possible acute nondisplaced fracture. Associated large joint effusion within the suprapatellar recess. Advanced degenerative osteoarthritic changes seen about the knee, most notable at the patellofemoral and lateral femorotibial joint space compartments. Underlying osteopenia. Overlying soft tissues within normal limits. IMPRESSION: 1. Focal cortical irregularity at the medial femoral condyle, suspicious for possible acute nondisplaced fracture. Correlation with physical exam recommended. 2. Associated large joint effusion. 3. Advanced degenerative osteoarthrosis. Electronically Signed   By: Jeannine Boga M.D.   On: 10/18/2018 21:28   CT of the knee with preliminary report with left medial femoral condyle fracture with large joint effusion and hemorrhage, full report will not be available till morning when musculoskeletal radiologist comes on..  Patient was reassessed and she is still in a great deal of pain.  She does live alone in assisted living facility but does not use a cane or a walker.  Patient is established with Dr. Rhona Raider with [facility].  Will speak to on-call physician.  2:26 AM Discussed with Dr. Mayer Camel-- she likely will need operative repair.  Medical admission, they will evaluate in AM.  Knee immobilizer for now.  Discussed with Dr. Alcario Drought-- will admit for ongoing care.   Larene Pickett, PA-C 10/19/18 0518    Orpah Greek, MD 10/20/18 [phone]

## 2018-10-19 NOTE — Progress Notes (Signed)
Pt's daughter, Rush Landmark, called for update on pt status and plan of care. RN answered all questions to satisfaction. Will continue to monitor.

## 2018-10-19 NOTE — Consult Note (Addendum)
Reason for Consult:   Closed fracture of medial condyle of distal femur  Referring Physician: Dr. Jennette Kettle  [name] is an 85 y.o. female.   HPI: [name] reported to the ER yesterday evening and was admitted due to a right knee injury.  She was standing at the fridge of her assisted living facility when she turned and lost her footing.  Her feet got tangled and she fell on the floor.  She complained of left sided headache and right knee pain.  She has a history of HTN, Prior PE, hyperlipidemia, CKD stage 3,and is on chronic anticoagulation with lovenox.  She is examined in her hospital bed after she has been admitted by the hospitalist service.    Past Medical History:  Diagnosis Date  . A-fib ([place])   . Acid reflux   . Anxiety   . Arthritis   . Chronic anticoagulation 03/07/2013   Lovenox 100 QD  . Chronic kidney disease    Stage III  . CVA (cerebral infarction) 1964  . Dementia [facility])    Dr Brett Fairy  . Diverticulosis   . DVT (deep venous thrombosis) ([place]) 2007   recurrent w PE s/p [place] filter  . GERD (gastroesophageal reflux disease)    w hiatal hernia endoscopy 2003, Dr Penelope Coop  . Glaucoma   . Hypercholesterolemia   . Hypertension   . Hypoglycemia   . Hypothyroidism    goiter  . IBS (irritable bowel syndrome)   . Junctional bradycardia    resolved with discontinuation of sotalol (2014) after 10 yrs  . Memory loss   . Migraine   . Osteoarthritis    in Bilateral knees and back. -Dr Latanya Maudlin  . Osteoporosis   . Panic attacks   . Phlebitis   . Postherpetic trigeminal neuralgia 10/20/2012  . Pulmonary embolism recurrent    IVC filter  . Shingles    zoster 1990 on the back and on the abdomen in 2007,facial zoster 2006  . Trigeminal neuralgia    r face--Dr Dohmeier  . Unspecified cerebral artery occlusion with cerebral infarction   . Vitamin D deficiency     Past Surgical History:  Procedure Laterality Date  . APPENDECTOMY  1945  . biopsies of  breast Bilateral 1971  . blood clots  06/2006   lower abdomen  . CARDIAC CATHETERIZATION  08/2001   A-fib/Palpitations Dr. Daneen Schick  . CHOLECYSTECTOMY  01/1983  . DILATION AND CURETTAGE, DIAGNOSTIC / THERAPEUTIC  1952/1977   x2  . FOOT SURGERY Right 1978   Cyst removed from Right Foot  . [place]  . [place]  . insertion of vena cova filter  2007  . LEG / ANKLE SOFT TISSUE BIOPSY Right    precancerous  . migraine speech impairment  07/1963  . phlebitis  12/2005  . PULMONARY EMBOLISM SURGERY  03/2005  . TONSILLECTOMY     1954    Family History  Problem Relation Age of Onset  . Diabetes Mother   . Hypertension Mother   . Glaucoma Mother   . CVA Mother   . CAD Mother   . Hypertension Father   . Heart attack Father   . Kidney failure Father   . Heart attack Brother        2 half brothers  . Hypertension Brother   . CVA Brother   . Glaucoma Other   . Glaucoma Other   . Prostate cancer Maternal Grandfather   . Breast  cancer Maternal Aunt   . Colon cancer Maternal Uncle   . Rectal cancer Maternal Uncle     Social History:  reports that she has never smoked. She has never used smokeless tobacco. She reports that she does not drink alcohol or use drugs.  Allergies:  Allergies  Allergen Reactions  . Benadryl [Diphenhydramine Hcl] Hives and Shortness Of Breath  . Eye Drops [Tetrahydrozoline Hcl] Other (See Comments)    Burning, itching, swelling  . Flu Virus Vaccine Shortness Of Breath  . Iohexol Hives and Shortness Of Breath     Code: HIVES, Desc: PT IS ALLERGIC TO IVP DYE/IODINE/SHRIMP.  CAUSES SOB AND HIVES PER PT.  STEPHANIE, RT-R, Onset Date: [id number]   . Shrimp [Shellfish Allergy] Anaphylaxis  . Voltaren [Diclofenac Sodium] Other (See Comments)    Acid reflux symptoms  . Ambien [Zolpidem Tartrate] Hives  . Gluten Meal Diarrhea  . Travatan Z [Travoprost] Other (See Comments)    BRAND NAME ONLY OLCANNOT TOLERATE GENERIC FORM OF  EYE DROPS, IT CONTAINS PRESERVATIVES-SENSITIVITY  . Ativan [Lorazepam]     delusional  . Hydrocodone     Only on the higher dosages  . Betadine Antibiotic-Moisturize [Bacitracin-Polymyxin B] Rash  . Biaxin [Clarithromycin] Hives  . Celebrex [Celecoxib] Rash  . Ciprofloxacin Hcl Rash       . Combigan [Brimonidine Tartrate-Timolol] Other (See Comments)    unknown  . Coumadin [Warfarin Sodium] Rash  . Ivp Dye [Iodinated Diagnostic Agents] Hives  . Levaquin [Levofloxacin In D5w] Rash  . Naprosyn [Naproxen] Rash  . Pataday [Olopatadine Hcl] Other (See Comments)  . Penicillins Hives    Has patient had a PCN reaction causing immediate rash, facial/tongue/throat swelling, SOB or lightheadedness with hypotension: Yes Has patient had a PCN reaction causing severe rash involving mucus membranes or skin necrosis: No Has patient had a PCN reaction that required hospitalization No Has patient had a PCN reaction occurring within the last 10 years: No If all of the above answers are "NO", then may proceed with Cephalosporin use.   Marland Kitchen Plavix [Clopidogrel Bisulfate] Rash  . Septra [Sulfamethoxazole-Trimethoprim] Hives    Medications:  Prior to Admission:  Medications Prior to Admission  Medication Sig Dispense Refill Last Dose  . amLODipine (NORVASC) 2.5 MG tablet Take 1 tablet (2.5 mg total) by mouth daily. Please make overdue appt for future refills. [phone]. 2nd attempt 15 tablet 0 10/18/2018 at Unknown time  . CARBATROL 100 MG 12 hr capsule TAKE 2 CAPSULES BY MOUTH 2 TIMES DAILY (Patient taking differently: Take 200 mg by mouth 2 (two) times daily. ) 120 capsule 5 10/18/2018 at Unknown time  . cholecalciferol (VITAMIN D) 1000 units tablet Take 1,000 Units by mouth daily.   10/18/2018 at Unknown time  . enoxaparin (LOVENOX) 100 MG/ML injection Inject 100 mg into the skin at bedtime.    10/17/2018 at 2200  . EPINEPHrine (EPIPEN 2-PAK) 0.3 mg/0.3 mL IJ SOAJ injection Inject 0.3 mg into the muscle  as needed for anaphylaxis.   unknown  . fluticasone (FLONASE) 50 MCG/ACT nasal spray Place 1 spray into left nostril daily as needed for allergies or rhinitis.   12 Past Month at Unknown time  . folic acid (FOLVITE) 1 MG tablet Take 1 mg by mouth daily.    10/18/2018 at Unknown time  . [name] 300 MG TABS TAKE 1 TABLET BY MOUTH 2 TIMES DAILY (Patient taking differently: Take 1 tablet by mouth 2 (two) times a day. ) 60 tablet 5 10/18/2018 at Unknown  time  . levothyroxine (SYNTHROID, LEVOTHROID) 75 MCG tablet Take 75 mcg by mouth daily before breakfast.    10/18/2018 at Unknown time  . loperamide (IMODIUM A-D) 2 MG tablet Take 2 mg by mouth 4 (four) times daily as needed for diarrhea or loose stools.   Past Month at Unknown time  . loratadine (CLARITIN) 10 MG tablet Take 10 mg by mouth daily.   10/18/2018 at Unknown time  . meclizine (ANTIVERT) 25 MG tablet Take 25 mg by mouth 2 (two) times daily as needed for dizziness or nausea.   1 Past Month at Unknown time  . Multiple Vitamin (MULTIVITAMIN WITH MINERALS) TABS tablet Take 1 tablet by mouth daily.   10/18/2018 at Unknown time  . NAMZARIC 28-10 MG CP24 TAKE 1 CAPSULE BY MOUTH EVERY DAY (Patient taking differently: Take 1 capsule by mouth daily. ) 30 capsule 6 10/18/2018 at Unknown time  . pravastatin (PRAVACHOL) 40 MG tablet Take 40 mg by mouth daily.    10/18/2018 at Unknown time  . RABEprazole (ACIPHEX) 20 MG tablet Take 20 mg by mouth daily.      Marland Kitchen ROCKLATAN 0.02-0.005 % SOLN Place 1 drop into both eyes at bedtime.   10/17/2018 at Unknown time  . sodium chloride 1 G tablet Take 1 tablet (1 g total) by mouth 2 (two) times daily with a meal. 60 tablet 0 10/18/2018 at Unknown time    Results for orders placed or performed during the hospital encounter of 10/18/18 (from the past 48 hour(s))  SARS Coronavirus 2 (CEPHEID - Performed in [place] hospital lab), Hosp Order     Status: None   Collection Time: 10/19/18  2:42 AM   Specimen: Nasopharyngeal Swab   Result Value Ref Range   SARS Coronavirus 2 NEGATIVE NEGATIVE    Comment: (NOTE) If result is NEGATIVE SARS-CoV-2 target nucleic acids are NOT DETECTED. The SARS-CoV-2 RNA is generally detectable in upper and lower  respiratory specimens during the acute phase of infection. The lowest  concentration of SARS-CoV-2 viral copies this assay can detect is 250  copies / mL. A negative result does not preclude SARS-CoV-2 infection  and should not be used as the sole basis for treatment or other  patient management decisions.  A negative result may occur with  improper specimen collection / handling, submission of specimen other  than nasopharyngeal swab, presence of viral mutation(s) within the  areas targeted by this assay, and inadequate number of viral copies  (<250 copies / mL). A negative result must be combined with clinical  observations, patient history, and epidemiological information. If result is POSITIVE SARS-CoV-2 target nucleic acids are DETECTED. The SARS-CoV-2 RNA is generally detectable in upper and lower  respiratory specimens dur ing the acute phase of infection.  Positive  results are indicative of active infection with SARS-CoV-2.  Clinical  correlation with patient history and other diagnostic information is  necessary to determine patient infection status.  Positive results do  not rule out bacterial infection or co-infection with other viruses. If result is PRESUMPTIVE POSTIVE SARS-CoV-2 nucleic acids MAY BE PRESENT.   A presumptive positive result was obtained on the submitted specimen  and confirmed on repeat testing.  While 2019 novel coronavirus  (SARS-CoV-2) nucleic acids may be present in the submitted sample  additional confirmatory testing may be necessary for epidemiological  and / or clinical management purposes  to differentiate between  SARS-CoV-2 and other Sarbecovirus currently known to infect humans.  If clinically indicated additional  testing with  an alternate test  methodology [phone]) is advised. The SARS-CoV-2 RNA is generally  detectable in upper and lower respiratory sp ecimens during the acute  phase of infection. The expected result is Negative. Fact Sheet for Patients:  StrictlyIdeas.no Fact Sheet for Healthcare Providers: BankingDealers.co.za This test is not yet approved or cleared by the [place] FDA and has been authorized for detection and/or diagnosis of SARS-CoV-2 by FDA under an Emergency Use Authorization (EUA).  This EUA will remain in effect (meaning this test can be used) for the duration of the COVID-19 declaration under Section 564(b)(1) of the Act, 21 U.S.C. section 360bbb-3(b)(1), unless the authorization is terminated or revoked sooner. Performed at [facility], [place] [address], [place], [place] [address]   CBC with Differential     Status: Abnormal   Collection Time: 10/19/18  3:17 AM  Result Value Ref Range   WBC 11.1 (H) 4.0 - 10.5 K/uL   RBC 3.44 (L) 3.87 - 5.11 MIL/uL   Hemoglobin 9.1 (L) 12.0 - 15.0 g/dL   HCT 28.6 (L) 36.0 - 46.0 %   MCV 83.1 80.0 - 100.0 fL   MCH 26.5 26.0 - 34.0 pg   MCHC 31.8 30.0 - 36.0 g/dL   RDW 17.2 (H) 11.5 - 15.5 %   Platelets 101 (L) 150 - 400 K/uL    Comment: REPEATED TO VERIFY PLATELET COUNT CONFIRMED BY SMEAR SPECIMEN CHECKED FOR CLOTS Immature Platelet Fraction may be clinically indicated, consider ordering this additional test [id number]    nRBC 0.0 0.0 - 0.2 %   Neutrophils Relative % 73 %   Neutro Abs 8.1 (H) 1.7 - 7.7 K/uL   Lymphocytes Relative 13 %   Lymphs Abs 1.4 0.7 - 4.0 K/uL   Monocytes Relative 12 %   Monocytes Absolute 1.4 (H) 0.1 - 1.0 K/uL   Eosinophils Relative 1 %   Eosinophils Absolute 0.1 0.0 - 0.5 K/uL   Basophils Relative 0 %   Basophils Absolute 0.0 0.0 - 0.1 K/uL   Immature Granulocytes 1 %   Abs Immature Granulocytes 0.05 0.00 - 0.07 K/uL    Comment: Performed at  [facility], [place] [address] [place], [place] [address]  Basic metabolic panel     Status: Abnormal   Collection Time: 10/19/18  3:17 AM  Result Value Ref Range   Sodium 138 135 - 145 mmol/L   Potassium 4.1 3.5 - 5.1 mmol/L   Chloride 107 98 - 111 mmol/L   CO2 19 (L) 22 - 32 mmol/L   Glucose, Bld 110 (H) 70 - 99 mg/dL   BUN 24 (H) 8 - 23 mg/dL   Creatinine, Ser 1.37 (H) 0.44 - 1.00 mg/dL   Calcium 8.8 (L) 8.9 - 10.3 mg/dL   GFR calc non Af Amer 35 (L) >60 mL/min   GFR calc Af Amer 40 (L) >60 mL/min   Anion gap 12 5 - 15    Comment: Performed at [facility] [address] [place], [place] [address]  Protime-INR     Status: None   Collection Time: 10/19/18  3:17 AM  Result Value Ref Range   Prothrombin Time 15.1 11.4 - 15.2 seconds   INR 1.2 0.8 - 1.2    Comment: (NOTE) INR goal varies based on device and disease states. Performed at [facility], [place] [address], [place], [place] [address]   Type and screen [place]  Status: None   Collection Time: 10/19/18  5:52 AM  Result Value Ref Range   ABO/RH(D) O POS    Antibody Screen NEG    Sample Expiration      10/22/2018,2359 Performed at [facility], [place] [address], [place], [place] [address]     Ct Head Wo Contrast  Result Date: 10/18/2018 CLINICAL DATA:  Fall hit left side of head EXAM: CT HEAD WITHOUT CONTRAST CT CERVICAL SPINE WITHOUT CONTRAST TECHNIQUE: Multidetector CT imaging of the head and cervical spine was performed following the standard protocol without intravenous contrast. Multiplanar CT image reconstructions of the cervical spine were also generated. COMPARISON:  CT brain and cervical spine 05/10/2015 FINDINGS: CT HEAD FINDINGS Brain: No acute territorial infarction, hemorrhage or intracranial mass. Mild atrophy. Moderate small vessel ischemic changes of the white matter. Chronic lacunar infarct left cerebellum. Chronic lacunar infarct left basal ganglia and white  matter. Slight ventricular enlargement, likely due to progression of atrophy. Vascular: No hyperdense vessels.  Carotid vascular calcification Skull: Normal. Negative for fracture or focal lesion. Sinuses/Orbits: No acute finding. Other: None CT CERVICAL SPINE FINDINGS Alignment: No subluxation.  Facet alignment within normal limits. Skull base and vertebrae: No acute fracture. No primary bone lesion or focal pathologic process. Soft tissues and spinal canal: No prevertebral fluid or swelling. No visible canal hematoma. Disc levels:  Mild degenerative changes at C4-C5 and C7-T1. Upper chest: Lung apices are clear. Other: None IMPRESSION: 1. No CT evidence for acute intracranial abnormality. Atrophy and small vessel ischemic changes of the white matter 2. No acute osseous abnormality of the cervical spine Electronically Signed   By: Donavan Foil M.D.   On: 10/18/2018 23:22   Ct Cervical Spine Wo Contrast  Result Date: 10/18/2018 CLINICAL DATA:  Fall hit left side of head EXAM: CT HEAD WITHOUT CONTRAST CT CERVICAL SPINE WITHOUT CONTRAST TECHNIQUE: Multidetector CT imaging of the head and cervical spine was performed following the standard protocol without intravenous contrast. Multiplanar CT image reconstructions of the cervical spine were also generated. COMPARISON:  CT brain and cervical spine 05/10/2015 FINDINGS: CT HEAD FINDINGS Brain: No acute territorial infarction, hemorrhage or intracranial mass. Mild atrophy. Moderate small vessel ischemic changes of the white matter. Chronic lacunar infarct left cerebellum. Chronic lacunar infarct left basal ganglia and white matter. Slight ventricular enlargement, likely due to progression of atrophy. Vascular: No hyperdense vessels.  Carotid vascular calcification Skull: Normal. Negative for fracture or focal lesion. Sinuses/Orbits: No acute finding. Other: None CT CERVICAL SPINE FINDINGS Alignment: No subluxation.  Facet alignment within normal limits. Skull base and  vertebrae: No acute fracture. No primary bone lesion or focal pathologic process. Soft tissues and spinal canal: No prevertebral fluid or swelling. No visible canal hematoma. Disc levels:  Mild degenerative changes at C4-C5 and C7-T1. Upper chest: Lung apices are clear. Other: None IMPRESSION: 1. No CT evidence for acute intracranial abnormality. Atrophy and small vessel ischemic changes of the white matter 2. No acute osseous abnormality of the cervical spine Electronically Signed   By: Donavan Foil M.D.   On: 10/18/2018 23:22   Ct Knee Right Wo Contrast  Result Date: 10/19/2018 CLINICAL DATA:  Status post fall.  Right knee pain. EXAM: CT OF THE RIGHT KNEE WITHOUT CONTRAST TECHNIQUE: Multidetector CT imaging of the RIGHT knee was performed according to the standard protocol. Multiplanar CT image reconstructions were also generated. COMPARISON:  None. FINDINGS: Bones/Joint/Cartilage Generalized osteopenia. Acute fracture of the distal medial femoral condyle with 5 mm  of medial displacement. Fracture extends to the lateral aspect of the intercondylar notch and to the articular surface of the medial trochlea. No other acute fracture or dislocation. Severe medial femorotibial compartment joint space narrowing with marginal osteophytes and subchondral sclerosis. Mild lateral femorotibial compartment joint space narrowing with marginal osteophytes. Hypertrophic changes of the tibial eminence. Severe lateral patellofemoral compartment joint space narrowing. Large hemarthrosis. Ligaments Suboptimally assessed by CT. Muscles and Tendons Muscles are normal. No muscle atrophy. Quadriceps tendon and patellar tendon are intact. Soft tissues No focal fluid collection. No soft tissue mass. Peripheral vascular atherosclerotic disease. IMPRESSION: 1. Acute minimally displaced fracture of the medial femoral condyle with the fracture cleft extending to the lateral aspect of the intercondylar notch and to the articular surface of  the medial trochlea. Large hemarthrosis. 2. Tricompartmental osteoarthritis as described above most severe in the medial femorotibial compartment and lateral patellofemoral compartment. Electronically Signed   By: Kathreen Devoid   On: 10/19/2018 07:04   Chest Portable 1 View  Result Date: 10/19/2018 CLINICAL DATA:  Preop chest x-ray EXAM: PORTABLE CHEST 1 VIEW COMPARISON:  09/20/2013 FINDINGS: Normal heart size and mediastinal contours when accounting for rotation. No acute infiltrate or edema. No effusion or pneumothorax. No acute osseous findings. IMPRESSION: No evidence of active disease. Electronically Signed   By: Monte Fantasia M.D.   On: 10/19/2018 05:46   Dg Knee Complete 4 Views Right  Result Date: 10/18/2018 CLINICAL DATA:  Initial evaluation for acute trauma, fall. EXAM: RIGHT KNEE - COMPLETE 4+ VIEW COMPARISON:  None. FINDINGS: Focal cortical irregularity seen at the medial femoral condyle, suspicious for possible acute nondisplaced fracture. Associated large joint effusion within the suprapatellar recess. Advanced degenerative osteoarthritic changes seen about the knee, most notable at the patellofemoral and lateral femorotibial joint space compartments. Underlying osteopenia. Overlying soft tissues within normal limits. IMPRESSION: 1. Focal cortical irregularity at the medial femoral condyle, suspicious for possible acute nondisplaced fracture. Correlation with physical exam recommended. 2. Associated large joint effusion. 3. Advanced degenerative osteoarthrosis. Electronically Signed   By: Jeannine Boga M.D.   On: 10/18/2018 21:28    ROS Blood pressure (!) 157/78, pulse 85, temperature 98 F (36.7 C), temperature source Oral, resp. rate 16, height 5\' 6"  (1.676 m), weight 90.7 kg, SpO2 96 %. Physical Exam   Pt's knee immobililizer was taken down and her knee examined.  Pt has moderate pain with palpation of the right knee.  She has a mild to moderate effusion.  No calf pain and is  N/V intact distally.    Assessment/Plan: Right knee medial Condyle insufficency fracture - Based on her history and type of fracture we are currently recommending against surgical intervention.  We would recommend non-weight bearing status and immobilization.  She has poor bone quality and a history of multiple falls making ORIF a poor choice.  Dr. Lennette Bihari [facility] trauma surgeon on call was kind enough to review the CT scan, and we discussed this case.  The consensus was nonoperative treatment based on the patient's comorbidities poor bone quality, history of dementia, and low rehab potential. Joanell Rising 10/19/2018, 7:47 AM

## 2018-10-20 LAB — BASIC METABOLIC PANEL
Anion gap: 8 (ref 5–15)
BUN: 21 mg/dL (ref 8–23)
CO2: 22 mmol/L (ref 22–32)
Calcium: 8.8 mg/dL — ABNORMAL LOW (ref 8.9–10.3)
Chloride: 106 mmol/L (ref 98–111)
Creatinine, Ser: 1.44 mg/dL — ABNORMAL HIGH (ref 0.44–1.00)
GFR calc Af Amer: 38 mL/min — ABNORMAL LOW (ref >60)
GFR calc non Af Amer: 33 mL/min — ABNORMAL LOW (ref >60)
Glucose, Bld: 94 mg/dL (ref 70–99)
Potassium: 4.5 mmol/L (ref 3.5–5.1)
Sodium: 136 mmol/L (ref 135–145)

## 2018-10-20 LAB — CBC
HCT: 28.6 % — ABNORMAL LOW (ref 36.0–46.0)
Hemoglobin: 9.2 g/dL — ABNORMAL LOW (ref 12.0–15.0)
MCH: 26.3 pg (ref 26.0–34.0)
MCHC: 32.2 g/dL (ref 30.0–36.0)
MCV: 81.7 fL (ref 80.0–100.0)
Platelets: 88 10*3/uL — ABNORMAL LOW (ref 150–400)
RBC: 3.5 MIL/uL — ABNORMAL LOW (ref 3.87–5.11)
RDW: 17.2 % — ABNORMAL HIGH (ref 11.5–15.5)
WBC: 8.3 10*3/uL (ref 4.0–10.5)
nRBC: 0 % (ref 0.0–0.2)

## 2018-10-20 NOTE — TOC Initial Note (Signed)
Transition of Care [facility]) - Initial/Assessment Note    Patient Details  Name: [name] MRN: [id number] Date of Birth: Aug 04, 1929  Transition of [facility]) CM/SW Contact:    Bartholomew Crews, RN Phone Number: [phone] 10/20/2018, 5:09 PM  Clinical Narrative:                 Received message that SNF was recommended. Spoke with patient at bedside. PTA lived at [place]. Prepared own meals and would eat in dining room once a day. States she has 2 walkers ( a short one and a tall one). Discussed recommendations for rehab. Permission given to talk to her daughter, Rush Landmark, at [phone]. [name] returned call. Discussed recommendations. [name] is in agreement for rehab although did express concerns about current pandemic. Advised of medicare.gov website and could do a SNF comparison. Permission granted to fax out patient information to facilities in [employer] preferably. Will follow for transition of care needs.   Expected Discharge Plan: Skilled Nursing Facility Barriers to Discharge: Continued Medical Work up   Patient Goals and CMS Choice   CMS Medicare.gov Compare Post Acute Care list provided to:: Patient Represenative (must comment)([name] Junius Creamer) Choice offered to / list presented to : Adult Children([name] Slagle)  Expected Discharge Plan and Services Expected Discharge Plan: [place] In-house Referral: Clinical Social Work Discharge Planning Services: CM Consult Post Acute Care Choice: [place] arrangements for the past 2 months: Independent Living Facility([facility])                 DME Arranged: N/A DME Agency: NA       HH Arranged: NA [place] Agency: NA        Prior Living Arrangements/Services Living arrangements for the past 2 months: Independent Living Facility([facility]) Lives with:: Self Patient language and need for interpreter reviewed:: Yes            Current home  services: DME Criminal Activity/Legal Involvement Pertinent to Current Situation/Hospitalization: No - Comment as needed  Activities of Daily Living      Permission Sought/Granted Permission sought to share information with : Family Supports Permission granted to share information with : Yes, Verbal Permission Granted  Share Information with NAME: Rush Landmark     Permission granted to share info w Relationship: daughter  Permission granted to share info w Contact Information: [phone]  Emotional Assessment Appearance:: Appears stated age Attitude/Demeanor/Rapport: Engaged Affect (typically observed): Accepting Orientation: : Oriented to  Time, Oriented to Place, Oriented to Self Alcohol / Substance Use: Not Applicable Psych Involvement: No (comment)  Admission diagnosis:  Pre-op chest exam [Z01.811] Closed displaced fracture of condyle of right femur, initial encounter ([place]) [S72.411A] Injury of head, initial encounter [S09.90XA] Fall, initial encounter [W19.XXXA] Patient Active Problem List   Diagnosis Date Noted  . Closed fracture of medial condyle of distal femur ([place]) 10/19/2018  . Cognitive impairment 10/31/2014  . TIA (transient ischemic attack) 12/03/2013  . UTI (lower urinary tract infection) 12/03/2013  . SIADH (syndrome of inappropriate ADH production) ([place]) 08/07/2013  . Back pain 08/03/2013  . Closed left arm fracture 08/03/2013  . Essential hypertension 04/07/2013  . PAF (paroxysmal atrial fibrillation) ([place]) 03/07/2013  . Chronic anticoagulation 03/07/2013  . Obesity, unspecified 03/07/2013  . Nausea 03/07/2013  . Hypothyroidism 03/07/2013  . Bradycardia 03/06/2013  . Dermatophytosis of nail 12/30/2012  . 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 Unspecified cerebral artery occlusion with cerebral infarction   .  Unspecified deficiency anemia 10/21/2012  . Postherpetic trigeminal neuralgia 10/20/2012   PCP:  [name],  MD Pharmacy:   [facility] [place], [place] - [address] [place] [address] Phone: [phone] Fax: [phone]     Social Determinants of Health (SDOH) Interventions    Readmission Risk Interventions No flowsheet data found.

## 2018-10-20 NOTE — Progress Notes (Signed)
PROGRESS NOTE  [name] JGG:[id number] DOB: 30-Jul-1929 DOA: 10/18/2018 PCP: [name], MD  Brief History   [name] is a 85 y.o. 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  Patient presents to the ED at [facility] following a mechanical fall.  She was standing at her fridge at the [facility], turned around, legs got tangled.  Landed on L side.  L sided headache as well as pain to posterior RIGHT knee.  Hasnt ambulated since fall.    No numbness, weakness, vision changes, back pain, neck pain.  ED Course: fracture of medial condyle of distal R femur with large joint effusion and hemorrhage into joint.  Of note, hadnt yet taken lovenox last evening prior to fall (so last lovenox was evening of 7/25).  CT head and c-spine neg.  [facility] was consulted to admit the patient for further evaluation and treatment. Orthopedic surgery was consulted. They have determined that the patient is not a candidate for surgery. They have recommended immobilization and non-weight bearing status. She will need placement.  Consultants  . Orthopedic surgery  Procedures  None  Antibiotics   Anti-infectives (From admission, onward)   None     Subjective  The patient is resting comfortably. No new complaints.  Objective   Vitals:  Vitals:   10/20/18 0830 10/20/18 1449  BP: (!) 146/61 (!) 145/75  Pulse: 88 87  Resp: 16 16  Temp: 97.6 F (36.4 C) 97.6 F (36.4 C)  SpO2: 95% 96%    Exam:  Constitutional:  . The patient is awake and alert. No acute distress. Respiratory:  . No increased work of breathing. . No wheezes, rales, or rhonchi . No tactile fremitus Cardiovascular:  . Regular rate and rhythm . No murmurs, ectopy, or gallups . No lateral PMI. No thrills. Abdomen:  . Abdomen is soft, non-tender, non-distended. . No hernias, masses, or hernias are appreciated. .  Normoactive bowel sounds. Musculoskeletal:  . No cyanosis, clubbing, or edema Skin:  . No rashes, lesions, ulcers . palpation of skin: no induration or nodules Neurologic:  . CN 2-12 intact . Sensation all 4 extremities intact Psychiatric:  . Mental status o Mood, affect appropriate o Orientation to person, place, time  . judgment and insight appear intact     I have personally reviewed the following:   Today's Data  . Vitals, CBC, BMP  Imaging  . CT Rt knee: Acute minimally displaced fracture of the medial femoral condyle with the fracture cleft extending to the lasteral aspect of the intercondylar notch and the articular surgace of the medial trochlear. Large hemarthrosis. Tricompartmental osteoarthritis.  Scheduled Meds: . amLODipine  2.5 mg Oral Daily  . carbamazepine  200 mg Oral BID  . cholecalciferol  1,000 Units Oral Daily  . memantine  28 mg Oral Daily   And  . donepezil  10 mg Oral Daily  . enoxaparin  100 mg Subcutaneous QHS  . feeding supplement (ENSURE ENLIVE)  237 mL Oral Q1500  . folic acid  1 mg Oral Daily  . gabapentin  300 mg Oral BID  . latanoprost  1 drop Both Eyes QHS  . levothyroxine  75 mcg Oral Q0600  . loratadine  10 mg Oral Daily  . multivitamin with minerals  1 tablet Oral Daily  . pantoprazole  40 mg Oral Daily  . pravastatin  40 mg Oral q1800  . sodium chloride  1 g Oral BID WC   Continuous Infusions:  Principal Problem:   Closed fracture of medial condyle of distal femur (HCC) Active Problems:   PAF (paroxysmal atrial fibrillation) (HCC)   Essential hypertension   SIADH (syndrome of inappropriate ADH production) (HCC)   Cognitive impairment   LOS: 1 day    A & P  Closed fracture of the medial condyle of distal femur: Non-operable. Orthopedic surgery recommends knee immobilizer and non-weight bearing status. Awaiting placement.  Paroxysmal Atrial Fibrillation: Rate is controlled without rate limiting medication. Continue Lovenox  for stroke prophylaxic and DVT/PE treatment.  History of DVT/PR: Continue Lovenox as outpatient. Pt has an IVC filter.  Hypertension: Poor control on Norvasc alone. Start Hydralazine.  Dementia: Continue aricept and donepezil  SIADH: Monitor Sodium and volume status.  DVT prophylaxis: Lovenox Code Status: Full Code Family Communication: None available Disposition Plan: Rehab/SNF  Kimra Kantor, DO [facility] Direct contact: see [email or web address]  7PM-7AM contact night coverage as above 10/20/2018, 3:14 PM  LOS: 1 day

## 2018-10-20 NOTE — Progress Notes (Signed)
CSW lvm with daughter [name] to discuss SNF discharge plan, awaiting return call.   [place], [place]

## 2018-10-20 NOTE — Progress Notes (Signed)
I talked to pt's daughter [name] this morning for pt's update.

## 2018-10-20 NOTE — NC FL2 (Signed)
[place] MEDICAID FL2 LEVEL OF CARE SCREENING TOOL     IDENTIFICATION  Patient Name: [name] Birthdate: 07-17-1929 Sex: female Admission Date (Current Location): 10/18/2018  [facility] and [place] Number:  Herbalist and Address:  [facility] [place] [address], [place], [place] [address]      Provider Number: [id number]  Attending Physician Name and Address:  Karie Kirks, DO  Relative Name and Phone Number:  [name] ([phone]    Current Level of Care: Hospital Recommended Level of Care: [place]  Prior Approval Number:    Date Approved/Denied:   PASRR Number: [id number]  Discharge Plan: SNF    Current Diagnoses: Patient Active Problem List   Diagnosis Date Noted  . Closed fracture of medial condyle of distal femur ([place]) 10/19/2018  . Cognitive impairment 10/31/2014  . TIA (transient ischemic attack) 12/03/2013  . UTI (lower urinary tract infection) 12/03/2013  . SIADH (syndrome of inappropriate ADH production) ([place]) 08/07/2013  . Back pain 08/03/2013  . Closed left arm fracture 08/03/2013  . Essential hypertension 04/07/2013  . PAF (paroxysmal atrial fibrillation) ([place]) 03/07/2013  . Chronic anticoagulation 03/07/2013  . Obesity, unspecified 03/07/2013  . Nausea 03/07/2013  . Hypothyroidism 03/07/2013  . Bradycardia 03/06/2013  . Dermatophytosis of nail 12/30/2012  . 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 Unspecified cerebral artery occlusion with cerebral infarction   . Unspecified deficiency anemia 10/21/2012  . Postherpetic trigeminal neuralgia 10/20/2012    Orientation RESPIRATION BLADDER Height & Weight     Place, Situation, Time  Normal Incontinent, External catheter Weight: 200 lb (90.7 kg) Height:  5\' 6"  (167.6 cm)  BEHAVIORAL SYMPTOMS/MOOD NEUROLOGICAL BOWEL NUTRITION STATUS      Continent Diet(see discharge summary)  AMBULATORY STATUS COMMUNICATION OF NEEDS Skin    Extensive Assist Verbally Normal(ecchymosis left hand)                       Personal Care Assistance Level of Assistance  Bathing, Feeding, Dressing, Total care Bathing Assistance: Maximum assistance Feeding assistance: Limited assistance Dressing Assistance: Maximum assistance Total Care Assistance: Maximum assistance   Functional Limitations Info  Sight, Speech, Hearing Sight Info: Adequate Hearing Info: Adequate Speech Info: Adequate    SPECIAL CARE FACTORS FREQUENCY  PT (By licensed PT), OT (By licensed OT)     PT Frequency: min 5x weekly OT Frequency: min 5x weekly            Contractures Contractures Info: Not present    Additional Factors Info  Code Status, Allergies Code Status Info: full Allergies Info: Benadryl (Diphenhydramine Hcl) Eye Drops (Tetrahydrozoline Hcl) Flu Virus Vaccine Iohexol Shrimp (Shellfish Allergy) Voltaren (Diclofenac Sodium) Ambien (Zolpidem Tartrate) Gluten Meal Travatan Z (Travoprost) Ativan (Lorazepam) Hydrocodone Betadine Antibiotic-moisturize (Bacitracin-polymyxin B) Biaxin (Clarithromycin) Celebrex (Celecoxib) Ciprofloxacin Hcl Combigan (Brimonidine Tartrate-timolol) Coumadin (Warfarin Sodium) Ivp Dye (Iodinated Diagnostic Agents) Levaquin (Levofloxacin In D5w) Naprosyn (Naproxen) Pat[place] (Olopatadine Hcl) Penicillins Plavix (Clopidogrel Bisulfate) Septra (Sulfamethoxazole-trimethoprim           Current Medications (10/20/2018):  This is the current hospital active medication list Current Facility-Administered Medications  Medication Dose Route Frequency Provider Last Rate Last Dose  . amLODipine (NORVASC) tablet 2.5 mg  2.5 mg Oral Daily Swayze, Ava, DO   2.5 mg at 10/20/18 0916  . carbamazepine (TEGRETOL XR) 12 hr tablet 200 mg  200 mg Oral BID Jennette Kettle M, DO   200 mg at 10/20/18 0919  . cholecalciferol (VITAMIN  D3) tablet 1,000 Units  1,000 Units Oral Daily Etta Quill, DO   1,000 Units at 10/20/18 6979  . memantine  (NAMENDA XR) 24 hr capsule 28 mg  28 mg Oral Daily Jennette Kettle M, DO   28 mg at 10/20/18 4801   And  . donepezil (ARICEPT) tablet 10 mg  10 mg Oral Daily Jennette Kettle M, DO   10 mg at 10/20/18 0916  . enoxaparin (LOVENOX) injection 100 mg  100 mg Subcutaneous QHS Swayze, Ava, DO   100 mg at 10/19/18 2252  . feeding supplement (ENSURE ENLIVE) (ENSURE ENLIVE) liquid 237 mL  237 mL Oral Q1500 Swayze, Ava, DO   237 mL at 10/20/18 1501  . fluticasone (FLONASE) 50 MCG/ACT nasal spray 1 spray  1 spray Each Nare Daily PRN Etta Quill, DO      . folic acid (FOLVITE) tablet 1 mg  1 mg Oral Daily Jennette Kettle M, DO   1 mg at 10/20/18 0916  . gabapentin (NEURONTIN) capsule 300 mg  300 mg Oral BID Jennette Kettle M, DO   300 mg at 10/20/18 6553  . HYDROcodone-acetaminophen (NORCO/VICODIN) 5-325 MG per tablet 1-2 tablet  1-2 tablet Oral Q6H PRN Etta Quill, DO   1 tablet at 10/20/18 0915  . latanoprost (XALATAN) 0.005 % ophthalmic solution 1 drop  1 drop Both Eyes QHS Jennette Kettle M, DO   1 drop at 10/19/18 2252  . levothyroxine (SYNTHROID) tablet 75 mcg  75 mcg Oral Q0600 Etta Quill, DO   75 mcg at 10/20/18 0518  . loratadine (CLARITIN) tablet 10 mg  10 mg Oral Daily Jennette Kettle M, DO   10 mg at 10/20/18 0916  . morphine 2 MG/ML injection 0.5 mg  0.5 mg Intravenous Q2H PRN Etta Quill, DO   0.5 mg at 10/19/18 1542  . multivitamin with minerals tablet 1 tablet  1 tablet Oral Daily Etta Quill, DO   1 tablet at 10/20/18 [phone]  . pantoprazole (PROTONIX) EC tablet 40 mg  40 mg Oral Daily Jennette Kettle M, DO   40 mg at 10/20/18 0916  . pravastatin (PRAVACHOL) tablet 40 mg  40 mg Oral q1800 Etta Quill, DO   40 mg at 10/19/18 1712  . sodium chloride tablet 1 g  1 g Oral BID WC Etta Quill, DO   1 g at 10/20/18 0919     Discharge Medications: Please see discharge summary for a list of discharge medications.  Relevant Imaging Results:  Relevant Lab  Results:   Additional Information SSN: [id number]  Alberteen Sam, LCSW

## 2018-10-20 NOTE — Progress Notes (Signed)
PATIENT ID: [name]  MRN: [id number]  DOB/AGE:  1929-04-07 / 85 y.o.        PROGRESS NOTE Subjective:   Patient is alert, oriented, no Nausea, no Vomiting, yes passing gas, no Bowel Movement. Taking PO well. Denies SOB, Chest or Calf Pain. Using Incentive Spirometer, PAS in place. Ambulate Non-weight bearing to right lower extremity, Patient reports pain as moderate,     Objective: Vital signs in last 24 hours: Temp:  [98.4 F (36.9 C)-98.7 F (37.1 C)] 98.7 F (37.1 C) (07/28 0401) Pulse Rate:  [76-85] 81 (07/28 0401) Resp:  [14-16] 14 (07/28 0401) BP: (129-169)/(43-65) 149/65 (07/28 0401) SpO2:  [93 %-95 %] 94 % (07/28 0401)    Intake/Output from previous day: I/O last 3 completed shifts: In: 720 [P.O.:720] Out: 1900 [Urine:1900]   Intake/Output this shift: No intake/output data recorded.   LABORATORY DATA: Recent Labs    10/19/18 0317  WBC 11.1*  HGB 9.1*  HCT 28.6*  PLT 101*  NA 138  K 4.1  CL 107  CO2 19*  BUN 24*  CREATININE 1.37*  GLUCOSE 110*  INR 1.2  CALCIUM 8.8*    Examination: Neurologically intact Neurovascular intact Sensation intact distally Intact pulses distally Dorsiflexion/Plantar flexion intact No cellulitis present Compartment soft moderate effusion}  Assessment:     Right knee medial Condyle insufficency fracture   ADDITIONAL DIAGNOSIS:  Hypertension, Cardiac Arrythmia Afib, Renal Insufficiency Chronic and dementia  Plan:  Non Weight Bearing (NWB)  DVT Prophylaxis:  Lovenox  DISCHARGE PLAN: Skilled Nursing Facility/Rehab, back to her assisted living facility if possible.  DISCHARGE NEEDS: HHPT, Walker and 3-in-1 comode seat     Joanell Rising 10/20/2018, 8:25 AM

## 2018-10-20 NOTE — Evaluation (Signed)
Physical Therapy Evaluation Patient Details Name: [name] MRN: [id number] DOB: 1929/05/25 Today's Date: 10/20/2018   History of Present Illness  [name] is a 85 y.o. 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 Pt presents with mechanical fall and sustained R distal femur fx of the medial condyle.   Clinical Impression  Pt admitted with above diagnosis. Pt currently with functional limitations due to the deficits listed below (see PT Problem List). Pt tolerated sitting EOB x10 mins with min A and supervision. Do not anticipate her being able to ambulate while keeping RLE NWB, will work towards safe transfers so she could get to a w/c with assistance. Needing max A for all mobility at this time. Recommend SNF at d/c.  Pt will benefit from skilled PT to increase their independence and safety with mobility to allow discharge to the venue listed below.       Follow Up Recommendations SNF;Supervision/Assistance - 24 hour    Equipment Recommendations  Wheelchair (measurements PT)    Recommendations for Other Services       Precautions / Restrictions Precautions Precautions: Fall Required Braces or Orthoses: Knee Immobilizer - Right Knee Immobilizer - Right: On at all times Restrictions Weight Bearing Restrictions: Yes RLE Weight Bearing: Non weight bearing      Mobility  Bed Mobility Overal bed mobility: Needs Assistance Bed Mobility: Supine to Sit;Sit to Supine     Supine to sit: Max assist Sit to supine: Total assist;+2 for physical assistance   General bed mobility comments: pt able to reach across body with LUE to grasp R rail. Max A to LE's for sliding to EOB and max A to pad for pivoting into sitting, pt able to manage upper body with HHA. Pt required +2 tot A for return to supine  Transfers                 General transfer comment: unable at this  point  Ambulation/Gait             General Gait Details: unable and do not anticipate her being able to ambulate and maintain R NWB   Stairs            Wheelchair Mobility    Modified Rankin (Stroke Patients Only)       Balance Overall balance assessment: Needs assistance Sitting-balance support: Feet supported;Single extremity supported Sitting balance-Leahy Scale: Fair Sitting balance - Comments: able to maintain static balance with RLE propped on low trash can. Needed min A at first but then only supervision. Lost balance posterior when PT performing MMT to LLE, self corrected with rail.   Postural control: Posterior lean                                   Pertinent Vitals/Pain Pain Assessment: Faces Faces Pain Scale: Hurts even more Pain Location: R knee Pain Descriptors / Indicators: Aching;Sore Pain Intervention(s): Limited activity within patient's tolerance;Monitored during session    Home Living Family/patient expects to be discharged to:: Assisted living               Home Equipment: Walker - 2 wheels;Walker - 4 wheels Additional Comments: ALF with memory care    Prior Function           Comments: pt is a poor historian and cannot relay PLOF  but per chart she was up in kitchen when she fell     Hand Dominance        Extremity/Trunk Assessment   Upper Extremity Assessment Upper Extremity Assessment: Generalized weakness    Lower Extremity Assessment Lower Extremity Assessment: Generalized weakness;RLE deficits/detail;LLE deficits/detail RLE Deficits / Details: hip flex 1/5, hip abd <3/5 RLE: Unable to fully assess due to immobilization RLE Sensation: WNL RLE Coordination: decreased gross motor LLE Deficits / Details: hip flex 3-/5, knee ext 3+/5 LLE Sensation: WNL LLE Coordination: WNL    Cervical / Trunk Assessment Cervical / Trunk Assessment: Kyphotic  Communication   Communication: Expressive  difficulties(word finding)  Cognition Arousal/Alertness: Awake/alert Behavior During Therapy: WFL for tasks assessed/performed Overall Cognitive Status: History of cognitive impairments - at baseline                                 General Comments: STM deficits noted in conversation, cannot remember where she lives      General Comments      Exercises     Assessment/Plan    PT Assessment Patient needs continued PT services  PT Problem List Obesity;Pain;Decreased strength;Decreased range of motion;Decreased activity tolerance;Decreased balance;Decreased mobility;Decreased coordination;Decreased cognition;Decreased knowledge of use of DME;Decreased knowledge of precautions       PT Treatment Interventions DME instruction;Functional mobility training;Therapeutic activities;Therapeutic exercise;Balance training;Neuromuscular re-education;Patient/family education    PT Goals (Current goals can be found in the Care Plan section)  Acute Rehab PT Goals Patient Stated Goal: get better PT Goal Formulation: With patient Time For Goal Achievement: 11/03/18 Potential to Achieve Goals: Fair    Frequency Min 3X/week   Barriers to discharge Decreased caregiver support ALF    Co-evaluation               AM-PAC PT "6 Clicks" Mobility  Outcome Measure Help needed turning from your back to your side while in a flat bed without using bedrails?: Total Help needed moving from lying on your back to sitting on the side of a flat bed without using bedrails?: Total Help needed moving to and from a bed to a chair (including a wheelchair)?: Total Help needed standing up from a chair using your arms (e.g., wheelchair or bedside chair)?: Total Help needed to walk in hospital room?: Total Help needed climbing 3-5 steps with a railing? : Total 6 Click Score: 6    End of Session Equipment Utilized During Treatment: Right knee immobilizer Activity Tolerance: Patient tolerated  treatment well Patient left: in bed;with bed alarm set;with call bell/phone within reach Nurse Communication: Mobility status PT Visit Diagnosis: Muscle weakness (generalized) (M62.81);History of falling (Z91.81);Difficulty in walking, not elsewhere classified (R26.2)    Time: 5277-8242 PT Time Calculation (min) (ACUTE ONLY): 32 min   Charges:   PT Evaluation $PT Eval Moderate Complexity: 1 Mod PT Treatments $Therapeutic Activity: 8-22 mins        [place]  Pager [phone] Office [place] 10/20/2018, 2:18 PM

## 2018-10-21 LAB — BASIC METABOLIC PANEL
Anion gap: 8 (ref 5–15)
BUN: 22 mg/dL (ref 8–23)
CO2: 23 mmol/L (ref 22–32)
Calcium: 8.6 mg/dL — ABNORMAL LOW (ref 8.9–10.3)
Chloride: 107 mmol/L (ref 98–111)
Creatinine, Ser: 1.51 mg/dL — ABNORMAL HIGH (ref 0.44–1.00)
GFR calc Af Amer: 36 mL/min — ABNORMAL LOW (ref >60)
GFR calc non Af Amer: 31 mL/min — ABNORMAL LOW (ref >60)
Glucose, Bld: 99 mg/dL (ref 70–99)
Potassium: 4.5 mmol/L (ref 3.5–5.1)
Sodium: 138 mmol/L (ref 135–145)

## 2018-10-21 LAB — CBC WITH DIFFERENTIAL/PLATELET
Abs Immature Granulocytes: 0.03 K/uL (ref 0.00–0.07)
Basophils Absolute: 0.1 K/uL (ref 0.0–0.1)
Basophils Relative: 1 %
Eosinophils Absolute: 0.3 K/uL (ref 0.0–0.5)
Eosinophils Relative: 4 %
HCT: 28.6 % — ABNORMAL LOW (ref 36.0–46.0)
Hemoglobin: 9.2 g/dL — ABNORMAL LOW (ref 12.0–15.0)
Immature Granulocytes: 0 %
Lymphocytes Relative: 22 %
Lymphs Abs: 1.8 K/uL (ref 0.7–4.0)
MCH: 26.4 pg (ref 26.0–34.0)
MCHC: 32.2 g/dL (ref 30.0–36.0)
MCV: 82.2 fL (ref 80.0–100.0)
Monocytes Absolute: 1.2 K/uL — ABNORMAL HIGH (ref 0.1–1.0)
Monocytes Relative: 15 %
Neutro Abs: 4.9 K/uL (ref 1.7–7.7)
Neutrophils Relative %: 58 %
Platelets: 85 K/uL — ABNORMAL LOW (ref 150–400)
RBC: 3.48 MIL/uL — ABNORMAL LOW (ref 3.87–5.11)
RDW: 17.2 % — ABNORMAL HIGH (ref 11.5–15.5)
WBC: 8.4 K/uL (ref 4.0–10.5)
nRBC: 0 % (ref 0.0–0.2)

## 2018-10-21 MED ORDER — ENSURE ENLIVE PO LIQD: 237.0000 mL | Freq: Every day | ORAL | 12 refills | Status: AC

## 2018-10-21 MED ORDER — HYDROCODONE-ACETAMINOPHEN 5-325 MG PO TABS: 1.0000 | ORAL_TABLET | Freq: Four times a day (QID) | ORAL | 0 refills | Status: DC | PRN

## 2018-10-21 MED ORDER — LACTULOSE 10 GM/15ML PO SOLN
20.0000 g | Freq: Three times a day (TID) | ORAL | Status: DC
  Filled 2018-10-21: qty 30

## 2018-10-21 NOTE — Progress Notes (Signed)
PATIENT ID: SEMAYA VIDA  MRN: [id number]  DOB/AGE:  [birth year] / 85 y.o.        PROGRESS NOTE Subjective:   Patient is alert, oriented, no Nausea, no Vomiting, yes passing gas, no Bowel Movement. Taking PO well, with pt up in bed eating. Denies SOB, Chest or Calf Pain. Using Incentive Spirometer, PAS in place. Ambulate non-weight bearing, Patient reports pain as moderate,     Objective: Vital signs in last 24 hours: Temp:  [97.6 F (36.4 C)-99.1 F (37.3 C)] 98.2 F (36.8 C) (07/29 0615) Pulse Rate:  [72-90] 72 (07/29 0615) Resp:  [16] 16 (07/29 0615) BP: (143-149)/(54-75) 149/59 (07/29 0615) SpO2:  [95 %-96 %] 95 % (07/29 0615)    Intake/Output from previous day: I/O last 3 completed shifts: In: 720 [P.O.:720] Out: 2400 [Urine:2400]   Intake/Output this shift: No intake/output data recorded.   LABORATORY DATA: Recent Labs    10/19/18 0317 10/20/18 1207 10/21/18 0343  WBC 11.1* 8.3 8.4  HGB 9.1* 9.2* 9.2*  HCT 28.6* 28.6* 28.6*  PLT 101* 88* 85*  NA 138 136 138  K 4.1 4.5 4.5  CL 107 106 107  CO2 19* 22 23  BUN 24* 21 22  CREATININE 1.37* 1.44* 1.51*  GLUCOSE 110* 94 99  INR 1.2  --   --   CALCIUM 8.8* 8.8* 8.6*    Examination: Neurologically intact Neurovascular intact Sensation intact distally Intact pulses distally Dorsiflexion/Plantar flexion intact No cellulitis present}  Assessment:     Right knee medial Condyle insufficency fracture   ADDITIONAL DIAGNOSIS:  Cardiac Arrythmia Afib, Renal Insufficiency Chronic and dementia  Plan:  Non Weight Bearing (NWB)  DVT Prophylaxis:  Lovenox  DISCHARGE PLAN: Skilled Nursing Facility/Rehab  DISCHARGE NEEDS: HHPT, Walker and 3-in-1 comode seat     Joanell Rising 10/21/2018, 8:46 AM

## 2018-10-21 NOTE — Progress Notes (Signed)
Physical Therapy Treatment Patient Details Name: [name] MRN: [id number] DOB: 09-10-1929 Today's Date: 10/21/2018    History of Present Illness Pt is an 85 y.o. female admitted from ALF on 10/18/18 after mechanical fall sustaining R distal femur fx of medial condyle; plan for non-operative management due to multiple comorbidities. PMH includes PAF, recurrent DVT/PE with IVC filter, stroke, dementia.   PT Comments    Pt seen for additional session to assist NT with transfer to bed; required maxA+2 for squat pivot transfer, pt tolerated well but difficulty maintaining RLE NWB precautions. Agreeable to supine therex, reliant on AAROM due to pain and weakness.   Do not expect pt to progress with standing activity/ambulation while maintaining NWB precautions; initiated discussion regarding need for w/c for future mobility based on current functional status and comorbidities. Will focus PT efforts on transfer training and therex. Pt remains motivated to participate and hopeful to regain PLOF.   Follow Up Recommendations  SNF;Supervision/Assistance - 24 hour     Equipment Recommendations  Wheelchair (measurements PT)    Recommendations for Other Services       Precautions / Restrictions Precautions Precautions: Fall  Restrictions Weight Bearing Restrictions: Yes RLE Weight Bearing: Non weight bearing    Mobility  Bed Mobility Overal bed mobility: Needs Assistance Bed Mobility: Sit to Supine     Supine to sit: Mod assist;+2 for physical assistance Sit to supine: Total assist;+2 for physical assistance   General bed mobility comments: TotalA for trunk control and BLE management returning to supine; maxA to reposition in bed once supine  Transfers Overall transfer level: Needs assistance Equipment used: 2 person hand held assist Transfers: Squat Pivot Transfers Sit to Stand: Mod assist;+2 physical assistance   Squat pivot transfers: Max assist;+2 physical assistance      General transfer comment: MaxA+2 for squat pivot from recliner to bed with bilat HHA and bed pad, cues for RLE NWB precautions. Pt able to perform partial standing from EOB for repositioning with BUE support, unable to maintain RLE NWB when initiating anterior weight translation to stand  Ambulation/Gait             General Gait Details: Unable to maintain RLE NWB   Stairs             Wheelchair Mobility    Modified Rankin (Stroke Patients Only)       Balance Overall balance assessment: Needs assistance Sitting-balance support: Feet supported;Single extremity supported Sitting balance-Leahy Scale: Fair Sitting balance - Comments: Initial minA to maintain seated balance, progressing to supervision level; able to scoot forward with modA Postural control: Posterior lean                                  Cognition Arousal/Alertness: Awake/alert Behavior During Therapy: WFL for tasks assessed/performed Overall Cognitive Status: History of cognitive impairments - at baseline                                 General Comments: Answering majority of questions and following one-step commands appropriately. STM deficits noted. Per chart, h/o dementia      Exercises General Exercises - Lower Extremity Quad Sets: AROM;Right;Supine Hip ABduction/ADduction: AAROM;Right;Supine Hip Flexion/Marching: AAROM;Right;Supine    General Comments General comments (skin integrity, edema, etc.): Initiated discussion regarding likely need for w/c for future mobility due to current condition/non-operative fx, RLE NWB  status, comorbidities and need for assist      Pertinent Vitals/Pain Pain Assessment: Faces Faces Pain Scale: Hurts little more Pain Location: RLE Pain Descriptors / Indicators: Sore;Grimacing Pain Intervention(s): Monitored during session;Repositioned;Patient requesting pain meds-RN notified    Home Living Family/patient expects to be  discharged to:: Assisted living             Home Equipment: Walker - 2 wheels;Walker - 4 wheels Additional Comments: ALF with memory care    Prior Function Level of Independence: Independent with assistive device(s)      Comments: pt is a poor historian and cannot relay PLOF but per chart she was up in kitchen when she fell   PT Goals (current goals can now be found in the care plan section) Acute Rehab PT Goals Patient Stated Goal: get better PT Goal Formulation: With patient Time For Goal Achievement: 11/03/18 Potential to Achieve Goals: Fair Progress towards PT goals: Progressing toward goals    Frequency    Min 3X/week      PT Plan Current plan remains appropriate    Co-evaluation              AM-PAC PT "6 Clicks" Mobility   Outcome Measure  Help needed turning from your back to your side while in a flat bed without using bedrails?: A Lot Help needed moving from lying on your back to sitting on the side of a flat bed without using bedrails?: A Lot Help needed moving to and from a bed to a chair (including a wheelchair)?: Total Help needed standing up from a chair using your arms (e.g., wheelchair or bedside chair)?: Total Help needed to walk in hospital room?: Total Help needed climbing 3-5 steps with a railing? : Total 6 Click Score: 8    End of Session Equipment Utilized During Treatment: Gait belt Activity Tolerance: Patient tolerated treatment well Patient left: in bed;with call bell/phone within reach;with bed alarm set Nurse Communication: Mobility status PT Visit Diagnosis: Muscle weakness (generalized) (M62.81);History of falling (Z91.81);Difficulty in walking, not elsewhere classified (R26.2)     Time: 9233-0076 PT Time Calculation (min) (ACUTE ONLY): 19 min  Charges:  $Therapeutic Exercise: 8-22 mins                    Mabeline Caras, PT, DPT Acute Rehabilitation Services  Pager [phone] Office [place] 10/21/2018, 1:15 PM

## 2018-10-21 NOTE — Progress Notes (Addendum)
Pt is sitting up in the chair, assisted by PT.  Pt's daughter [name] updated. 89 Pt had no BM x4 days, Dr Benny Lennert notified. Soap suds enema done, pt had a small BM. ![facility] notified but Corey Harold is not avail until late tonight. Will d/c pt tom, Dr Benny Lennert notified.

## 2018-10-21 NOTE — Progress Notes (Signed)
Physical Therapy Treatment Patient Details Name: [name] MRN: [id number] DOB: April 27, 1929 Today's Date: 10/21/2018    History of Present Illness Pt is an 85 y.o. female admitted from ALF on 10/18/18 after mechanical fall sustaining R distal femur fx of medial condyle; plan for non-operative management due to multiple comorbidities. PMH includes PAF, recurrent DVT/PE with IVC filter, stroke, dementia.   PT Comments    Pt progressing with mobility. Able to perform squat pivot transfer to recliner with maxA+2. Pt able to achieve partial standing with modA+2 but difficulty maintaining RLE NWB and unable to achieve fully upright posture. Remains agreeable to participate. Continue to recommend SNF-level therapies.    Follow Up Recommendations  SNF;Supervision/Assistance - 24 hour     Equipment Recommendations  Wheelchair (measurements PT)    Recommendations for Other Services       Precautions / Restrictions Precautions Precautions: Fall Restrictions Weight Bearing Restrictions: Yes RLE Weight Bearing: Non weight bearing    Mobility  Bed Mobility Overal bed mobility: Needs Assistance Bed Mobility: Supine to Sit     Supine to sit: Mod assist;+2 for physical assistance     General bed mobility comments: Able to reach with LUE to grab R rail; modA+2 to manage BLEs and assist trunk elevation with HHA  Transfers Overall transfer level: Needs assistance   Transfers: Sit to/from Stand;Squat Pivot Transfers Sit to Stand: Mod assist;+2 physical assistance   Squat pivot transfers: Max assist;+2 physical assistance     General transfer comment: Pt able to perform 2x partial sit<>stands with modA+2, heavy reliance on UE support, difficulty maintaining RLE NWB precautions, unable to achieve fully upright posture. MaxA+2 to pivot from bed to recliner with BUE HHA and bed pad  Ambulation/Gait             General Gait Details: unable and do not anticipate her being able to  ambulate and maintain R NWB    Stairs             Wheelchair Mobility    Modified Rankin (Stroke Patients Only)       Balance Overall balance assessment: Needs assistance Sitting-balance support: Feet supported;Single extremity supported Sitting balance-Leahy Scale: Fair Sitting balance - Comments: Initial minA to maintain seated balance, progressing to supervision level; able to scoot forward with modA Postural control: Posterior lean                                  Cognition Arousal/Alertness: Awake/alert Behavior During Therapy: WFL for tasks assessed/performed Overall Cognitive Status: History of cognitive impairments - at baseline                                 General Comments: Answering majority of questions and following one-step commands appropriately. STM deficits noted. Per chart, h/o dementia      Exercises General Exercises - Lower Extremity Long Arc Quad: AAROM;Right;Seated Hip Flexion/Marching: PROM;Right;Seated    General Comments        Pertinent Vitals/Pain Pain Assessment: Faces Faces Pain Scale: Hurts little more Pain Location: RLE Pain Descriptors / Indicators: Sore;Grimacing Pain Intervention(s): Monitored during session;Repositioned;Patient requesting pain meds-RN notified    Home Living                      Prior Function  PT Goals (current goals can now be found in the care plan section) Acute Rehab PT Goals Patient Stated Goal: get better PT Goal Formulation: With patient Time For Goal Achievement: 11/03/18 Potential to Achieve Goals: Fair Progress towards PT goals: Progressing toward goals    Frequency    Min 3X/week      PT Plan Current plan remains appropriate    Co-evaluation              AM-PAC PT "6 Clicks" Mobility   Outcome Measure  Help needed turning from your back to your side while in a flat bed without using bedrails?: A Lot Help needed moving  from lying on your back to sitting on the side of a flat bed without using bedrails?: A Lot Help needed moving to and from a bed to a chair (including a wheelchair)?: Total Help needed standing up from a chair using your arms (e.g., wheelchair or bedside chair)?: Total Help needed to walk in hospital room?: Total Help needed climbing 3-5 steps with a railing? : Total 6 Click Score: 8    End of Session Equipment Utilized During Treatment: Gait belt Activity Tolerance: Patient tolerated treatment well Patient left: in chair;with call bell/phone within reach;with chair alarm set;with nursing/sitter in room Nurse Communication: Mobility status PT Visit Diagnosis: Muscle weakness (generalized) (M62.81);History of falling (Z91.81);Difficulty in walking, not elsewhere classified (R26.2)     Time: 5449-2010 PT Time Calculation (min) (ACUTE ONLY): 27 min  Charges:  $Therapeutic Activity: 8-22 mins                     Mabeline Caras, PT, DPT Acute Rehabilitation Services  Pager [phone] Office [place] 10/21/2018, 10:22 AM

## 2018-10-21 NOTE — Discharge Summary (Signed)
Physician Discharge Summary  [name] YQM:[id number] DOB: 1930/03/22 DOA: 10/18/2018  PCP: [name], MD  Admit date: 10/18/2018 Discharge date: 10/21/2018  Recommendations for Outpatient Follow-up:  1. The patient will be discharged to SNF 2. When she goes home from SNF she will need a walker and a bedside commode. 3. Follow up with orthopedic surgery as directed. 4. Follow up with PCP in 7-10 days after discharge from SNF. 5. Non-weight bearing on the right leg.  Follow-up Information    Frederik Pear, MD Follow up in 7 day(s).   Specialty: Orthopedic Surgery Contact information: [place] [address] [phone]          Discharge Diagnoses: Principal diagnosis is #1 1. Closed fracture of the medial condyle of the right distal femur  Discharge Condition: Fair Disposition: SNF  Diet recommendation: Heart Healthy  Filed Weights   10/18/18 2042  Weight: 90.7 kg    History of present illness:  [name] is a 85 y.o. 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  Patient presents to the ED at [facility] following a mechanical fall.  She was standing at her fridge at the [facility], turned around, legs got tangled.  Landed on L side.  L sided headache as well as pain to posterior RIGHT knee.  Hasnt ambulated since fall.    No numbness, weakness, vision changes, back pain, neck pain.  ED Course: fracture of medial condyle of distal R femur with large joint effusion and hemorrhage into joint.  Of note, hadnt yet taken lovenox last evening prior to fall (so last lovenox was evening of 7/25).  CT head and c-spine neg.  Hospital Course:  The patient was admitted to a medical bed. Orthopedic surgery was consulted and the patient was evaluated by Frederik Pear, MD. He discussed the patient with Katha Hamming MD (Trauma surgeon). The consensus was that the patient was  not a candidate for operative repair due to her comorbidities, poor bone quality, and dementia. Their recommendation was for non-weight bearing status and a knee immobilizer. When the patient is discharged from SNF their recommendation is for a walker and a bedside commode as well as home health PT.  Today's assessment: S: The patient is sitting up in a chair. No new complaints. O: Vitals:  Vitals:   10/21/18 0615 10/21/18 0849  BP: (!) 149/59 (!) 121/47  Pulse: 72 75  Resp: 16 17  Temp: 98.2 F (36.8 C) 98.3 F (36.8 C)  SpO2: 95% 95%    Constitutional:   The patient is awake, alert, and oriented x 3. No new complaints. Respiratory:   There is no increased work of breathing.  No wheezes, rales, or rhonchi.  No tactile fremitus. Cardiovascular:   Regular rate and rhythm.  No murmurs, ectopy, and gallups.  No lateral PMI. No thrills. Abdomen:   Abdomen is soft, non-tender, non-distended.  No hernias, masses, or organomegaly  Normoactive bowel sounds. Musculoskeletal:   No cyanosis, clubbing, or edema Skin:   No rashes, lesions, ulcers  palpation of skin: no induration or nodules Neurologic:   CN 2-12 intact  Sensation all 4 extremities intact Psychiatric:   judgement and insight appear normal  Mental status o Mood, affect appropriate o Orientation to person, place, time     Discharge Instructions  Discharge Instructions    Activity as tolerated - No restrictions   Complete by: As directed  Call MD for:  redness, tenderness, or signs of infection (pain, swelling, redness, odor or green/yellow discharge around incision site)   Complete by: As directed    Call MD for:  severe uncontrolled pain   Complete by: As directed    Call MD for:  temperature >100.4   Complete by: As directed    Diet - low sodium heart healthy   Complete by: As directed    Discharge instructions   Complete by: As directed    Non-weight bearing on the right leg. Follow  up with orthopedic surgery as directed.  Follow up with PCP in 7-10 days   Increase activity slowly   Complete by: As directed    Non weight bearing   Complete by: As directed    Non weight bearing   Complete by: As directed      Allergies as of 10/21/2018      Reactions   Benadryl [diphenhydramine Hcl] Hives, Shortness Of Breath   Eye Drops [tetrahydrozoline Hcl] Other (See Comments)   Burning, itching, swelling   Flu Virus Vaccine Shortness Of Breath   Iohexol Hives, Shortness Of Breath    Code: HIVES, Desc: PT IS ALLERGIC TO IVP DYE/IODINE/SHRIMP.  CAUSES SOB AND HIVES PER PT.  STEPHANIE, RT-R, Onset Date: [id number]   Shrimp [shellfish Allergy] Anaphylaxis   Voltaren [diclofenac Sodium] Other (See Comments)   Acid reflux symptoms   Ambien [zolpidem Tartrate] Hives   Gluten Meal Diarrhea   Travatan Z [travoprost] Other (See Comments)   BRAND NAME ONLY OLCANNOT TOLERATE GENERIC FORM OF EYE DROPS, IT CONTAINS PRESERVATIVES-SENSITIVITY   Ativan [lorazepam]    delusional   Hydrocodone    Only on the higher dosages   Betadine Antibiotic-moisturize [bacitracin-polymyxin B] Rash   Biaxin [clarithromycin] Hives   Celebrex [celecoxib] Rash   Ciprofloxacin Hcl Rash      Combigan [brimonidine Tartrate-timolol] Other (See Comments)   unknown   Coumadin [warfarin Sodium] Rash   Ivp Dye [iodinated Diagnostic Agents] Hives   Levaquin [levofloxacin In D5w] Rash   Naprosyn [naproxen] Rash   Pataday [olopatadine Hcl] Other (See Comments)   Penicillins Hives   Has patient had a PCN reaction causing immediate rash, facial/tongue/throat swelling, SOB or lightheadedness with hypotension: Yes Has patient had a PCN reaction causing severe rash involving mucus membranes or skin necrosis: No Has patient had a PCN reaction that required hospitalization No Has patient had a PCN reaction occurring within the last 10 years: No If all of the above answers are "NO", then may proceed with Cephalosporin  use.   Plavix [clopidogrel Bisulfate] Rash   Septra [sulfamethoxazole-trimethoprim] Hives      Medication List    TAKE these medications   Aciphex 20 MG tablet Generic drug: RABEprazole Take 20 mg by mouth daily.   amLODipine 2.5 MG tablet Commonly known as: NORVASC Take 1 tablet (2.5 mg total) by mouth daily. Please make overdue appt for future refills. [phone]. 2nd attempt   Carbatrol 100 MG 12 hr capsule Generic drug: carbamazepine TAKE 2 CAPSULES BY MOUTH 2 TIMES DAILY What changed: See the new instructions.   cholecalciferol 1000 units tablet Commonly known as: VITAMIN D Take 1,000 Units by mouth daily.   EpiPen 2-Pak 0.3 mg/0.3 mL Soaj injection Generic drug: EPINEPHrine Inject 0.3 mg into the muscle as needed for anaphylaxis.   feeding supplement (ENSURE ENLIVE) Liqd Take 237 mLs by mouth daily at 3 pm.   fluticasone 50 MCG/ACT nasal spray Commonly known as: [place]  1 spray into left nostril daily as needed for allergies or rhinitis.   folic acid 1 MG tablet Commonly known as: FOLVITE Take 1 mg by mouth daily.   [name] 300 MG Tabs Generic drug: Gabapentin (Once-Daily) TAKE 1 TABLET BY MOUTH 2 TIMES DAILY What changed: when to take this   HYDROcodone-acetaminophen 5-325 MG tablet Commonly known as: NORCO/VICODIN Take 1-2 tablets by mouth every 6 (six) hours as needed for moderate pain.   levothyroxine 75 MCG tablet Commonly known as: SYNTHROID Take 75 mcg by mouth daily before breakfast.   loperamide 2 MG tablet Commonly known as: IMODIUM A-D Take 2 mg by mouth 4 (four) times daily as needed for diarrhea or loose stools.   loratadine 10 MG tablet Commonly known as: CLARITIN Take 10 mg by mouth daily.   Lovenox 100 MG/ML injection Generic drug: enoxaparin Inject 100 mg into the skin at bedtime.   meclizine 25 MG tablet Commonly known as: ANTIVERT Take 25 mg by mouth 2 (two) times daily as needed for dizziness or nausea.     multivitamin with minerals Tabs tablet Take 1 tablet by mouth daily.   Namzaric 28-10 MG Cp24 Generic drug: Memantine HCl-Donepezil HCl TAKE 1 CAPSULE BY MOUTH EVERY DAY What changed: how much to take   Pravachol 40 MG tablet Generic drug: pravastatin Take 40 mg by mouth daily.   Rocklatan 0.02-0.005 % Soln Generic drug: Netarsudil-Latanoprost Place 1 drop into both eyes at bedtime.   sodium chloride 1 g tablet Take 1 tablet (1 g total) by mouth 2 (two) times daily with a meal.            Discharge Care Instructions  (From admission, onward)         Start     Ordered   10/21/18 0000  Non weight bearing     10/21/18 0851   10/19/18 0000  Non weight bearing     10/19/18 3893         Allergies  Allergen Reactions   Benadryl [Diphenhydramine Hcl] Hives and Shortness Of Breath   Eye Drops [Tetrahydrozoline Hcl] Other (See Comments)    Burning, itching, swelling   Flu Virus Vaccine Shortness Of Breath   Iohexol Hives and Shortness Of Breath     Code: HIVES, Desc: PT IS ALLERGIC TO IVP DYE/IODINE/SHRIMP.  CAUSES SOB AND HIVES PER PT.  STEPHANIE, RT-R, Onset Date: [id number]    Shrimp [Shellfish Allergy] Anaphylaxis   Voltaren [Diclofenac Sodium] Other (See Comments)    Acid reflux symptoms   Ambien [Zolpidem Tartrate] Hives   Gluten Meal Diarrhea   Travatan Z [Travoprost] Other (See Comments)    BRAND NAME ONLY OLCANNOT TOLERATE GENERIC FORM OF EYE DROPS, IT CONTAINS PRESERVATIVES-SENSITIVITY   Ativan [Lorazepam]     delusional   Hydrocodone     Only on the higher dosages   Betadine Antibiotic-Moisturize [Bacitracin-Polymyxin B] Rash   Biaxin [Clarithromycin] Hives   Celebrex [Celecoxib] Rash   Ciprofloxacin Hcl Rash        Combigan [Brimonidine Tartrate-Timolol] Other (See Comments)    unknown   Coumadin [Warfarin Sodium] Rash   Ivp Dye [Iodinated Diagnostic Agents] Hives   Levaquin [Levofloxacin In D5w] Rash   Naprosyn [Naproxen]  Rash   Pataday [Olopatadine Hcl] Other (See Comments)   Penicillins Hives    Has patient had a PCN reaction causing immediate rash, facial/tongue/throat swelling, SOB or lightheadedness with hypotension: Yes Has patient had a PCN reaction causing severe rash involving mucus membranes or  skin necrosis: No Has patient had a PCN reaction that required hospitalization No Has patient had a PCN reaction occurring within the last 10 years: No If all of the above answers are "NO", then may proceed with Cephalosporin use.    Plavix [Clopidogrel Bisulfate] Rash   Septra [Sulfamethoxazole-Trimethoprim] Hives    The results of significant diagnostics from this hospitalization (including imaging, microbiology, ancillary and laboratory) are listed below for reference.    Significant Diagnostic Studies: Ct Head Wo Contrast  Result Date: 10/18/2018 CLINICAL DATA:  Fall hit left side of head EXAM: CT HEAD WITHOUT CONTRAST CT CERVICAL SPINE WITHOUT CONTRAST TECHNIQUE: Multidetector CT imaging of the head and cervical spine was performed following the standard protocol without intravenous contrast. Multiplanar CT image reconstructions of the cervical spine were also generated. COMPARISON:  CT brain and cervical spine 05/10/2015 FINDINGS: CT HEAD FINDINGS Brain: No acute territorial infarction, hemorrhage or intracranial mass. Mild atrophy. Moderate small vessel ischemic changes of the white matter. Chronic lacunar infarct left cerebellum. Chronic lacunar infarct left basal ganglia and white matter. Slight ventricular enlargement, likely due to progression of atrophy. Vascular: No hyperdense vessels.  Carotid vascular calcification Skull: Normal. Negative for fracture or focal lesion. Sinuses/Orbits: No acute finding. Other: None CT CERVICAL SPINE FINDINGS Alignment: No subluxation.  Facet alignment within normal limits. Skull base and vertebrae: No acute fracture. No primary bone lesion or focal pathologic  process. Soft tissues and spinal canal: No prevertebral fluid or swelling. No visible canal hematoma. Disc levels:  Mild degenerative changes at C4-C5 and C7-T1. Upper chest: Lung apices are clear. Other: None IMPRESSION: 1. No CT evidence for acute intracranial abnormality. Atrophy and small vessel ischemic changes of the white matter 2. No acute osseous abnormality of the cervical spine Electronically Signed   By: Donavan Foil M.D.   On: 10/18/2018 23:22   Ct Cervical Spine Wo Contrast  Result Date: 10/18/2018 CLINICAL DATA:  Fall hit left side of head EXAM: CT HEAD WITHOUT CONTRAST CT CERVICAL SPINE WITHOUT CONTRAST TECHNIQUE: Multidetector CT imaging of the head and cervical spine was performed following the standard protocol without intravenous contrast. Multiplanar CT image reconstructions of the cervical spine were also generated. COMPARISON:  CT brain and cervical spine 05/10/2015 FINDINGS: CT HEAD FINDINGS Brain: No acute territorial infarction, hemorrhage or intracranial mass. Mild atrophy. Moderate small vessel ischemic changes of the white matter. Chronic lacunar infarct left cerebellum. Chronic lacunar infarct left basal ganglia and white matter. Slight ventricular enlargement, likely due to progression of atrophy. Vascular: No hyperdense vessels.  Carotid vascular calcification Skull: Normal. Negative for fracture or focal lesion. Sinuses/Orbits: No acute finding. Other: None CT CERVICAL SPINE FINDINGS Alignment: No subluxation.  Facet alignment within normal limits. Skull base and vertebrae: No acute fracture. No primary bone lesion or focal pathologic process. Soft tissues and spinal canal: No prevertebral fluid or swelling. No visible canal hematoma. Disc levels:  Mild degenerative changes at C4-C5 and C7-T1. Upper chest: Lung apices are clear. Other: None IMPRESSION: 1. No CT evidence for acute intracranial abnormality. Atrophy and small vessel ischemic changes of the white matter 2. No acute  osseous abnormality of the cervical spine Electronically Signed   By: Donavan Foil M.D.   On: 10/18/2018 23:22   Ct Knee Right Wo Contrast  Result Date: 10/19/2018 CLINICAL DATA:  Status post fall.  Right knee pain. EXAM: CT OF THE RIGHT KNEE WITHOUT CONTRAST TECHNIQUE: Multidetector CT imaging of the RIGHT knee was performed according to the standard  protocol. Multiplanar CT image reconstructions were also generated. COMPARISON:  None. FINDINGS: Bones/Joint/Cartilage Generalized osteopenia. Acute fracture of the distal medial femoral condyle with 5 mm of medial displacement. Fracture extends to the lateral aspect of the intercondylar notch and to the articular surface of the medial trochlea. No other acute fracture or dislocation. Severe medial femorotibial compartment joint space narrowing with marginal osteophytes and subchondral sclerosis. Mild lateral femorotibial compartment joint space narrowing with marginal osteophytes. Hypertrophic changes of the tibial eminence. Severe lateral patellofemoral compartment joint space narrowing. Large hemarthrosis. Ligaments Suboptimally assessed by CT. Muscles and Tendons Muscles are normal. No muscle atrophy. Quadriceps tendon and patellar tendon are intact. Soft tissues No focal fluid collection. No soft tissue mass. Peripheral vascular atherosclerotic disease. IMPRESSION: 1. Acute minimally displaced fracture of the medial femoral condyle with the fracture cleft extending to the lateral aspect of the intercondylar notch and to the articular surface of the medial trochlea. Large hemarthrosis. 2. Tricompartmental osteoarthritis as described above most severe in the medial femorotibial compartment and lateral patellofemoral compartment. Electronically Signed   By: Kathreen Devoid   On: 10/19/2018 07:04   Chest Portable 1 View  Result Date: 10/19/2018 CLINICAL DATA:  Preop chest x-ray EXAM: PORTABLE CHEST 1 VIEW COMPARISON:  09/20/2013 FINDINGS: Normal heart size and  mediastinal contours when accounting for rotation. No acute infiltrate or edema. No effusion or pneumothorax. No acute osseous findings. IMPRESSION: No evidence of active disease. Electronically Signed   By: Monte Fantasia M.D.   On: 10/19/2018 05:46   Dg Knee Complete 4 Views Right  Result Date: 10/18/2018 CLINICAL DATA:  Initial evaluation for acute trauma, fall. EXAM: RIGHT KNEE - COMPLETE 4+ VIEW COMPARISON:  None. FINDINGS: Focal cortical irregularity seen at the medial femoral condyle, suspicious for possible acute nondisplaced fracture. Associated large joint effusion within the suprapatellar recess. Advanced degenerative osteoarthritic changes seen about the knee, most notable at the patellofemoral and lateral femorotibial joint space compartments. Underlying osteopenia. Overlying soft tissues within normal limits. IMPRESSION: 1. Focal cortical irregularity at the medial femoral condyle, suspicious for possible acute nondisplaced fracture. Correlation with physical exam recommended. 2. Associated large joint effusion. 3. Advanced degenerative osteoarthrosis. Electronically Signed   By: Jeannine Boga M.D.   On: 10/18/2018 21:28    Microbiology: Recent Results (from the past 240 hour(s))  SARS Coronavirus 2 (CEPHEID - Performed in [place] hospital lab), Hosp Order     Status: None   Collection Time: 10/19/18  2:42 AM   Specimen: Nasopharyngeal Swab  Result Value Ref Range Status   SARS Coronavirus 2 NEGATIVE NEGATIVE Final    Comment: (NOTE) If result is NEGATIVE SARS-CoV-2 target nucleic acids are NOT DETECTED. The SARS-CoV-2 RNA is generally detectable in upper and lower  respiratory specimens during the acute phase of infection. The lowest  concentration of SARS-CoV-2 viral copies this assay can detect is 250  copies / mL. A negative result does not preclude SARS-CoV-2 infection  and should not be used as the sole basis for treatment or other  patient management decisions.   A negative result may occur with  improper specimen collection / handling, submission of specimen other  than nasopharyngeal swab, presence of viral mutation(s) within the  areas targeted by this assay, and inadequate number of viral copies  (<250 copies / mL). A negative result must be combined with clinical  observations, patient history, and epidemiological information. If result is POSITIVE SARS-CoV-2 target nucleic acids are DETECTED. The SARS-CoV-2 RNA is generally detectable  in upper and lower  respiratory specimens dur ing the acute phase of infection.  Positive  results are indicative of active infection with SARS-CoV-2.  Clinical  correlation with patient history and other diagnostic information is  necessary to determine patient infection status.  Positive results do  not rule out bacterial infection or co-infection with other viruses. If result is PRESUMPTIVE POSTIVE SARS-CoV-2 nucleic acids MAY BE PRESENT.   A presumptive positive result was obtained on the submitted specimen  and confirmed on repeat testing.  While 2019 novel coronavirus  (SARS-CoV-2) nucleic acids may be present in the submitted sample  additional confirmatory testing may be necessary for epidemiological  and / or clinical management purposes  to differentiate between  SARS-CoV-2 and other Sarbecovirus currently known to infect humans.  If clinically indicated additional testing with an alternate test  methodology [phone]) is advised. The SARS-CoV-2 RNA is generally  detectable in upper and lower respiratory sp ecimens during the acute  phase of infection. The expected result is Negative. Fact Sheet for Patients:  StrictlyIdeas.no Fact Sheet for Healthcare Providers: BankingDealers.co.za This test is not yet approved or cleared by the [place] FDA and has been authorized for detection and/or diagnosis of SARS-CoV-2 by FDA under an Emergency Use  Authorization (EUA).  This EUA will remain in effect (meaning this test can be used) for the duration of the COVID-19 declaration under Section 564(b)(1) of the Act, 21 U.S.C. section 360bbb-3(b)(1), unless the authorization is terminated or revoked sooner. Performed at [facility], [place] [address] [place],  [address]      Labs: Basic Metabolic Panel: Recent Labs  Lab 10/19/18 0317 10/20/18 1207 10/21/18 0343  NA 138 136 138  K 4.1 4.5 4.5  CL 107 106 107  CO2 19* 22 23  GLUCOSE 110* 94 99  BUN 24* 21 22  CREATININE 1.37* 1.44* 1.51*  CALCIUM 8.8* 8.8* 8.6*   Liver Function Tests: No results for input(s): AST, ALT, ALKPHOS, BILITOT, PROT, ALBUMIN in the last 168 hours. No results for input(s): LIPASE, AMYLASE in the last 168 hours. No results for input(s): AMMONIA in the last 168 hours. CBC: Recent Labs  Lab 10/19/18 0317 10/20/18 1207 10/21/18 0343  WBC 11.1* 8.3 8.4  NEUTROABS 8.1*  --  4.9  HGB 9.1* 9.2* 9.2*  HCT 28.6* 28.6* 28.6*  MCV 83.1 81.7 82.2  PLT 101* 88* 85*   Cardiac Enzymes: No results for input(s): CKTOTAL, CKMB, CKMBINDEX, TROPONINI in the last 168 hours. BNP: BNP (last 3 results) No results for input(s): BNP in the last 8760 hours.  ProBNP (last 3 results) No results for input(s): PROBNP in the last 8760 hours.  CBG: No results for input(s): GLUCAP in the last 168 hours.  Principal Problem:   Closed fracture of medial condyle of distal femur (HCC) Active Problems:   PAF (paroxysmal atrial fibrillation) (HCC)   Essential hypertension   SIADH (syndrome of inappropriate ADH production) (HCC)   Cognitive impairment   Time coordinating discharge: 38 minutes.  Signed:        Byrl Latin, DO [facility]  10/21/2018, 1:27 PM

## 2018-10-21 NOTE — Discharge Instructions (Signed)
Distal Femur Fracture, Adult A distal femur fracture is a break in the lower part of the thigh bone (femur) near the knee joint. There are several types of this fracture. They include:  Stable. The bones remain in place after the break.  Displaced. The bones no longer line up after the break.  Comminuted. The bone breaks into several pieces.  Open. The broken bone comes through the skin. What are the causes? This condition may be caused by:  A fall. This injury can result from the impact of the fall or other violent contact.  A motor vehicle accident.  A sports injury.  Other collisions with a hard surface. What increases the risk? You are more likely to develop this condition if:  You are female.  You are 25-41 years old.  You participate in high-energy sports such as soccer, ice hockey, football, and baseball.  You have a condition that weakens the bones, such as osteoporosis.  You have had a knee replacement. What are the signs or symptoms? Symptoms of this condition include:  Pain.  Swelling.  Bruising.  Inability to bend your knee.  Misshapen knee.  Inability to walk.  Inability to use your injured leg to support your body weight. How is this diagnosed? This condition may be diagnosed based on:  Your symptoms.  A physical exam.  Other tests, such as: ? Imaging studies, such as an X-ray, CT scan, MRI scan, or ultrasound. ? A procedure to view the inside of your knee with a small camera (arthroscopy). How is this treated? This condition may be treated with:  A splint. You will wear the splint until the swelling goes down.  A cast. This is used to keep the fractured bone from moving while it heals. A cast is usually put on after swelling has gone down.  Surgery. This is done to move the bones back into place. The surgeon will use a combination of screws, metal plates, or rods (hardware) to hold the bones in place.  Physical therapy. Follow these  instructions at home: Medicines  Take over-the-counter and prescription medicines only as told by your health care provider.  Do not drive or operate heavy machinery while taking pain medicine.  If you are taking prescription pain medicine, take actions to prevent or treat constipation. Your health care provider may recommend that you: ? Drink enough fluid to keep your urine pale yellow. ? Eat foods that are high in fiber, such as fresh fruits and vegetables, whole grains, and beans. ? Limit foods that are high in fat and processed sugars, such as fried or sweet foods. ? Take an over-the-counter or prescription medicine for constipation. If you have a splint:  Wear the splint as told by your health care provider. Remove it only as told by your health care provider.  Loosen the splint if your toes tingle, become numb, or turn cold and blue.  Keep the splint clean.  If the splint is not waterproof: ? Do not let it get wet. ? Cover it with a watertight covering when you take a bath or a shower. If you have a cast:  Do not stick anything inside the cast to scratch your skin. Doing that increases your risk of infection.  Check the skin around the cast every day. Tell your health care provider about any concerns.  You may put lotion on dry skin around the edges of the cast. Do not put lotion on the skin underneath the cast.  Keep the  cast clean.  If the cast is not waterproof: ? Do not let it get wet. ? Cover it with a watertight covering when you take a bath or a shower. Activity  Do not use your leg to support your body weight until your health care provider says that you can. Follow weight-bearing restrictions.  Use crutches, a cane, or a walker as directed.  Return to your normal activities as directed by your health care provider. Ask your health care provider what activities are safe for you.  Do not drive until your health care provider approves. Managing pain,  stiffness, and swelling   If directed, put ice on the injured area: ? If you have a removable splint, remove it as told by your health care provider. ? Put ice in a plastic bag. ? Place a towel between your skin and the bag. ? Leave the ice on for 20 minutes, 2-3 times a day.  Move your toes often to avoid stiffness and to lessen swelling.  Raise the injured area above the level of your heart while you are lying down. General instructions   Do not put pressure on any part of the cast or splint until it is fully hardened. This may take several hours.  Do not use any products that contain nicotine or tobacco, such as cigarettes and e-cigarettes. These can delay bone healing. If you need help quitting, ask your health care provider.  Keep all follow-up visits as told by your health care provider. This is important. Contact a health care provider if:  You have knee pain and swelling.  You have trouble walking.  Your cast becomes wet or damaged or suddenly feels too tight. Get help right away if:  Your pain and swelling get worse.  You have severe pain below the fracture.  Your skin or toenails turn blue or gray, feel cold, or become numb.  You have fluid, blood, or pus coming from under your cast.  You develop a fever.  You have pain, swelling, or redness in your leg. Summary  A distal femur fracture is a break in the lower part of the thigh bone (femur).  Falls are the most common causes of femur fractures, but sports-related injuries and motor vehicle accidents also can cause this.  Treatment will depend on characteristics of the fracture, such as location, shape, displacement, and whether the fracture broke through the skin. Options include splinting, casting, or surgery. This information is not intended to replace advice given to you by your health care provider. Make sure you discuss any questions you have with your health care provider. Document Released: 01/22/2006  Document Revised: 07/01/2018 Document Reviewed: 04/30/2017 Elsevier Patient Education  2020 [employer].

## 2018-10-21 NOTE — Progress Notes (Signed)
PROGRESS NOTE  [name] GYF:[id number] DOB: [birth year] DOA: 10/18/2018 PCP: [name], MD  Brief History   [name] is a 85 y.o. 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  Patient presents to the ED at [facility] following a mechanical fall.  She was standing at her fridge at the [facility], turned around, legs got tangled.  Landed on L side.  L sided headache as well as pain to posterior RIGHT knee.  Hasnt ambulated since fall.    No numbness, weakness, vision changes, back pain, neck pain.  ED Course: fracture of medial condyle of distal R femur with large joint effusion and hemorrhage into joint.  Of note, hadnt yet taken lovenox last evening prior to fall (so last lovenox was evening of 7/25).  CT head and c-spine neg.  [facility] was consulted to admit the patient for further evaluation and treatment. Orthopedic surgery was consulted. They have determined that the patient is not a candidate for surgery. They have recommended immobilization and non-weight bearing status. She is awaiting placement.  Consultants  . Orthopedic surgery  Procedures  None  Antibiotics   Anti-infectives (From admission, onward)   None     Subjective  The patient is resting comfortably. No new complaints.  Objective   Vitals:  Vitals:   10/21/18 0615 10/21/18 0849  BP: (!) 149/59 (!) 121/47  Pulse: 72 75  Resp: 16 17  Temp: 98.2 F (36.8 C) 98.3 F (36.8 C)  SpO2: 95% 95%    Exam:  Constitutional:  . The patient is awake and alert. No acute distress. Respiratory:  . No increased work of breathing. . No wheezes, rales, or rhonchi . No tactile fremitus Cardiovascular:  . Regular rate and rhythm . No murmurs, ectopy, or gallups . No lateral PMI. No thrills. Abdomen:  . Abdomen is soft, non-tender, non-distended. . No hernias, masses, or hernias are appreciated. .  Normoactive bowel sounds. Musculoskeletal:  . No cyanosis, clubbing, or edema Skin:  . No rashes, lesions, ulcers . palpation of skin: no induration or nodules Neurologic:  . CN 2-12 intact . Sensation all 4 extremities intact Psychiatric:  . Mental status o Mood, affect appropriate o Orientation to person, place, time  . judgment and insight appear intact     I have personally reviewed the following:   Today's Data  . Vitals, CBC, BMP  Imaging  . CT Rt knee: Acute minimally displaced fracture of the medial femoral condyle with the fracture cleft extending to the lasteral aspect of the intercondylar notch and the articular surgace of the medial trochlear. Large hemarthrosis. Tricompartmental osteoarthritis.  Scheduled Meds: . amLODipine  2.5 mg Oral Daily  . carbamazepine  200 mg Oral BID  . cholecalciferol  1,000 Units Oral Daily  . memantine  28 mg Oral Daily   And  . donepezil  10 mg Oral Daily  . enoxaparin  100 mg Subcutaneous QHS  . feeding supplement (ENSURE ENLIVE)  237 mL Oral Q1500  . folic acid  1 mg Oral Daily  . gabapentin  300 mg Oral BID  . latanoprost  1 drop Both Eyes QHS  . levothyroxine  75 mcg Oral Q0600  . loratadine  10 mg Oral Daily  . multivitamin with minerals  1 tablet Oral Daily  . pantoprazole  40 mg Oral Daily  . pravastatin  40 mg Oral q1800  . sodium chloride  1 g Oral BID WC   Continuous Infusions:  Principal Problem:   Closed fracture of medial condyle of distal femur (HCC) Active Problems:   PAF (paroxysmal atrial fibrillation) (HCC)   Essential hypertension   SIADH (syndrome of inappropriate ADH production) (HCC)   Cognitive impairment   LOS: 2 days    A & P  Closed fracture of the medial condyle of distal femur: Non-operable. Orthopedic surgery recommends knee immobilizer and non-weight bearing status. Awaiting placement.  Paroxysmal Atrial Fibrillation: Rate is controlled without rate limiting medication. Continue Lovenox  for stroke prophylaxic and DVT/PE treatment.  History of DVT/PR: Continue Lovenox as outpatient. Pt has an IVC filter.  Hypertension: Poor control on Norvasc alone. Start Hydralazine.  Dementia: Continue aricept and donepezil  SIADH: Monitor Sodium and volume status.  DVT prophylaxis: Lovenox Code Status: Full Code Family Communication: None available Disposition Plan: Rehab/SNF  Denise Kang, DO [facility] Direct contact: see [email or web address]  7PM-7AM contact night coverage as above 10/21/2018, 6:32 PM  LOS: 2 days

## 2018-10-21 NOTE — Progress Notes (Addendum)
Patient was pending dc for bowl movement.   Once this was achieved, CSW called [facility] to put patient back on the transport list, they report patient was 15th in line and would not be transported until very late at night. [facility] not accepting of this time frame as patient would need to arrive by 6pm.   Patient will dc to [name] tomorrow morning due to this. Daughter [name] has been updated.   [place], [place]

## 2018-10-21 NOTE — Progress Notes (Signed)
Occupational Therapy Evaluation Patient Details Name: [name] MRN: [id number] DOB: [birth year] Today's Date: 10/21/2018    History of Present Illness Pt is an 85 y.o. female admitted from ALF on 10/18/18 after mechanical fall sustaining R distal femur fx of medial condyle; plan for non-operative management due to multiple comorbidities. PMH includes PAF, recurrent DVT/PE with IVC filter, stroke, dementia.   Clinical Impression   Pt presents with above diagnosis. PTA pt PLOF living in ALF requiring assistance as needed. Pt reports not requiring assistance but presents as a poor historian. Pt currently limited in ADLs and functional transfer, but to pain, weakness and limited function. Pt will benefit from continued acute OT to address deficits and to maximize independence prior to dc to dc setting.     Follow Up Recommendations  SNF;Supervision/Assistance - 24 hour    Equipment Recommendations       Recommendations for Other Services       Precautions / Restrictions Precautions Precautions: Fall Required Braces or Orthoses: Knee Immobilizer - Right Knee Immobilizer - Right: On at all times Restrictions Weight Bearing Restrictions: Yes RLE Weight Bearing: Non weight bearing      Mobility Bed Mobility Overal bed mobility: Needs Assistance Bed Mobility: Supine to Sit     Supine to sit: Mod assist;+2 for physical assistance     General bed mobility comments: Able to reach with LUE to grab R rail; modA+2 to manage BLEs and assist trunk elevation with HHA  Transfers Overall transfer level: Needs assistance   Transfers: Sit to/from Stand;Squat Pivot Transfers Sit to Stand: Mod assist;+2 physical assistance   Squat pivot transfers: Max assist;+2 physical assistance     General transfer comment: Pt able to perform 2x partial sit<>stands with modA+2, heavy reliance on UE support, difficulty maintaining RLE NWB precautions, unable to achieve fully upright posture. MaxA+2  to pivot from bed to recliner with BUE HHA and bed pad    Balance Overall balance assessment: Needs assistance Sitting-balance support: Feet supported;Single extremity supported Sitting balance-Leahy Scale: Fair Sitting balance - Comments: Initial minA to maintain seated balance, progressing to supervision level; able to scoot forward with modA Postural control: Posterior lean                                 ADL either performed or assessed with clinical judgement   ADL Overall ADL's : Needs assistance/impaired Eating/Feeding: Set up;Sitting   Grooming: Set up;Sitting   Upper Body Bathing: Supervision/ safety;Sitting   Lower Body Bathing: Moderate assistance;Sitting/lateral leans   Upper Body Dressing : Supervision/safety;Sitting   Lower Body Dressing: Maximal assistance;Sitting/lateral leans   Toilet Transfer: Maximal assistance;+2 for physical assistance;+2 for safety/equipment;Stand-pivot Toilet Transfer Details (indicate cue type and reason): simulated toilet transfer from bed to recliner.                 Vision         Perception     Praxis      Pertinent Vitals/Pain Pain Assessment: Faces Faces Pain Scale: Hurts little more Pain Location: RLE Pain Descriptors / Indicators: Sore;Grimacing Pain Intervention(s): Monitored during session;Repositioned     Hand Dominance Right   Extremity/Trunk Assessment Upper Extremity Assessment Upper Extremity Assessment: Generalized weakness   Lower Extremity Assessment Lower Extremity Assessment: Defer to PT evaluation RLE Deficits / Details: hip flex 1/5, hip abd <3/5 RLE: Unable to fully assess due to immobilization RLE Sensation: WNL RLE Coordination: decreased  gross motor LLE Deficits / Details: hip flex 3-/5, knee ext 3+/5 LLE Sensation: WNL LLE Coordination: WNL   Cervical / Trunk Assessment Cervical / Trunk Assessment: Kyphotic   Communication Communication Communication: Expressive  difficulties(word finding)   Cognition Arousal/Alertness: Awake/alert Behavior During Therapy: WFL for tasks assessed/performed Overall Cognitive Status: History of cognitive impairments - at baseline                                 General Comments: Answering majority of questions and following one-step commands appropriately. STM deficits noted. Per chart, h/o dementia   General Comments       Exercises     Shoulder Instructions      Home Living Family/patient expects to be discharged to:: Assisted living                             Home Equipment: Walker - 2 wheels;Walker - 4 wheels   Additional Comments: ALF with memory care      Prior Functioning/Environment Level of Independence: Independent with assistive device(s)        Comments: pt is a poor historian and cannot relay PLOF but per chart she was up in kitchen when she fell        OT Problem List:        OT Treatment/Interventions: Self-care/ADL training;Neuromuscular education;DME and/or AE instruction;Therapeutic activities;Patient/family education;Balance training    OT Goals(Current goals can be found in the care plan section) Acute Rehab OT Goals Patient Stated Goal: get better OT Goal Formulation: With patient Time For Goal Achievement: 11/04/18 Potential to Achieve Goals: Fair  OT Frequency: Min 2X/week   Barriers to D/C:            Co-evaluation              AM-PAC OT "6 Clicks" Daily Activity     Outcome Measure Help from another person eating meals?: A Little Help from another person taking care of personal grooming?: A Little Help from another person toileting, which includes using toliet, bedpan, or urinal?: A Lot Help from another person bathing (including washing, rinsing, drying)?: A Lot Help from another person to put on and taking off regular upper body clothing?: A Little Help from another person to put on and taking off regular lower body clothing?:  A Lot 6 Click Score: 15   End of Session Equipment Utilized During Treatment: Gait belt;Rolling walker Nurse Communication: Mobility status;Weight bearing status  Activity Tolerance: Patient tolerated treatment well Patient left: in chair;with call bell/phone within reach;with chair alarm set;with nursing/sitter in room  OT Visit Diagnosis: Pain;Unsteadiness on feet (R26.81) Pain - Right/Left: Right Pain - part of body: Leg                Time: 3086-5784 OT Time Calculation (min): 23 min Charges:  OT General Charges $OT Visit: 1 Visit OT Evaluation $OT Eval Moderate Complexity: [place], MSOT, OTR/L  Supplemental Rehabilitation Services  [phone]   Marius Ditch 10/21/2018, 12:08 PM

## 2018-10-21 NOTE — TOC Transition Note (Signed)
Transition of Care [facility]) - CM/SW Discharge Note   Patient Details  Name: [name] MRN: [id number] Date of Birth: July 23, 1929  Transition of Care [facility]) CM/SW Contact:  Alberteen Sam, LCSW Phone Number: 10/21/2018, 2:57 PM   Clinical Narrative:     Patient will DC to: [place] Anticipated DC date: 10/21/2018 Family notified: [name] Transport by: Corey Harold  Per MD patient ready for DC to [facility]. RN, patient, patient's family, and facility notified of DC. Discharge Summary sent to facility. RN given number for report [phone]  . DC packet on chart. Ambulance transport requested for patient.  CSW signing off.  [place], [place]   Final next level of care: Skilled Nursing Facility Barriers to Discharge: No Barriers Identified   Patient Goals and CMS Choice Patient states their goals for this hospitalization and ongoing recovery are:: (P) rehab CMS Medicare.gov Compare Post Acute Care list provided to:: Patient Represenative (must comment)([name] (daughter)) Choice offered to / list presented to : Adult Children  Discharge Placement PASRR number recieved: 10/20/18            Patient chooses bed at: [facility] Patient to be transferred to facility by: [facility] Name of family member notified: [name] Patient and family notified of of transfer: 10/21/18  Discharge Plan and Services In-house Referral: Clinical Social Work Discharge Planning Services: CM Consult Post Acute Care Choice: (P) [facility]          DME Arranged: N/A DME Agency: NA       HH Arranged: NA HH Agency: NA        Social Determinants of Health (SDOH) Interventions     Readmission Risk Interventions No flowsheet data found.

## 2018-10-21 NOTE — Plan of Care (Signed)
  Problem: Pain Managment: Goal: General experience of comfort will improve Outcome: Progressing   Problem: Safety: Goal: Ability to remain free from injury will improve Outcome: Progressing   Problem: Skin Integrity: Goal: Risk for impaired skin integrity will decrease Outcome: Progressing   

## 2018-10-22 DIAGNOSIS — Z111 Encounter for screening for respiratory tuberculosis: Secondary | ICD-10-CM | POA: Diagnosis not present

## 2018-10-22 DIAGNOSIS — I4891 Unspecified atrial fibrillation: Secondary | ICD-10-CM | POA: Diagnosis not present

## 2018-10-22 DIAGNOSIS — R197 Diarrhea, unspecified: Secondary | ICD-10-CM | POA: Diagnosis not present

## 2018-10-22 DIAGNOSIS — E871 Hypo-osmolality and hyponatremia: Secondary | ICD-10-CM | POA: Diagnosis not present

## 2018-10-22 DIAGNOSIS — M25561 Pain in right knee: Secondary | ICD-10-CM | POA: Diagnosis not present

## 2018-10-22 DIAGNOSIS — M2550 Pain in unspecified joint: Secondary | ICD-10-CM | POA: Diagnosis not present

## 2018-10-22 DIAGNOSIS — S72434D Nondisplaced fracture of medial condyle of right femur, subsequent encounter for closed fracture with routine healing: Secondary | ICD-10-CM | POA: Diagnosis not present

## 2018-10-22 DIAGNOSIS — E7800 Pure hypercholesterolemia, unspecified: Secondary | ICD-10-CM | POA: Diagnosis not present

## 2018-10-22 DIAGNOSIS — H8110 Benign paroxysmal vertigo, unspecified ear: Secondary | ICD-10-CM | POA: Diagnosis not present

## 2018-10-22 DIAGNOSIS — R58 Hemorrhage, not elsewhere classified: Secondary | ICD-10-CM | POA: Diagnosis not present

## 2018-10-22 DIAGNOSIS — M96661 Fracture of femur following insertion of orthopedic implant, joint prosthesis, or bone plate, right leg: Secondary | ICD-10-CM | POA: Diagnosis not present

## 2018-10-22 DIAGNOSIS — K5900 Constipation, unspecified: Secondary | ICD-10-CM | POA: Diagnosis not present

## 2018-10-22 DIAGNOSIS — Z23 Encounter for immunization: Secondary | ICD-10-CM | POA: Diagnosis not present

## 2018-10-22 DIAGNOSIS — F0390 Unspecified dementia without behavioral disturbance: Secondary | ICD-10-CM | POA: Diagnosis not present

## 2018-10-22 DIAGNOSIS — S72411D Displaced unspecified condyle fracture of lower end of right femur, subsequent encounter for closed fracture with routine healing: Secondary | ICD-10-CM | POA: Diagnosis not present

## 2018-10-22 DIAGNOSIS — H4089 Other specified glaucoma: Secondary | ICD-10-CM | POA: Diagnosis not present

## 2018-10-22 DIAGNOSIS — E222 Syndrome of inappropriate secretion of antidiuretic hormone: Secondary | ICD-10-CM | POA: Diagnosis not present

## 2018-10-22 DIAGNOSIS — H4010X0 Unspecified open-angle glaucoma, stage unspecified: Secondary | ICD-10-CM | POA: Diagnosis not present

## 2018-10-22 DIAGNOSIS — E669 Obesity, unspecified: Secondary | ICD-10-CM | POA: Diagnosis not present

## 2018-10-22 DIAGNOSIS — E559 Vitamin D deficiency, unspecified: Secondary | ICD-10-CM | POA: Diagnosis not present

## 2018-10-22 DIAGNOSIS — I480 Paroxysmal atrial fibrillation: Secondary | ICD-10-CM | POA: Diagnosis not present

## 2018-10-22 DIAGNOSIS — R0781 Pleurodynia: Secondary | ICD-10-CM | POA: Diagnosis not present

## 2018-10-22 DIAGNOSIS — E568 Deficiency of other vitamins: Secondary | ICD-10-CM | POA: Diagnosis not present

## 2018-10-22 DIAGNOSIS — I10 Essential (primary) hypertension: Secondary | ICD-10-CM | POA: Diagnosis not present

## 2018-10-22 DIAGNOSIS — I959 Hypotension, unspecified: Secondary | ICD-10-CM | POA: Diagnosis not present

## 2018-10-22 DIAGNOSIS — T782XXS Anaphylactic shock, unspecified, sequela: Secondary | ICD-10-CM | POA: Diagnosis not present

## 2018-10-22 DIAGNOSIS — M6281 Muscle weakness (generalized): Secondary | ICD-10-CM | POA: Diagnosis not present

## 2018-10-22 DIAGNOSIS — J3089 Other allergic rhinitis: Secondary | ICD-10-CM | POA: Diagnosis not present

## 2018-10-22 DIAGNOSIS — E785 Hyperlipidemia, unspecified: Secondary | ICD-10-CM | POA: Diagnosis not present

## 2018-10-22 DIAGNOSIS — Z03818 Encounter for observation for suspected exposure to other biological agents ruled out: Secondary | ICD-10-CM | POA: Diagnosis not present

## 2018-10-22 DIAGNOSIS — G2581 Restless legs syndrome: Secondary | ICD-10-CM | POA: Diagnosis not present

## 2018-10-22 DIAGNOSIS — R0902 Hypoxemia: Secondary | ICD-10-CM | POA: Diagnosis not present

## 2018-10-22 DIAGNOSIS — M79673 Pain in unspecified foot: Secondary | ICD-10-CM | POA: Diagnosis not present

## 2018-10-22 DIAGNOSIS — D649 Anemia, unspecified: Secondary | ICD-10-CM | POA: Diagnosis not present

## 2018-10-22 DIAGNOSIS — G894 Chronic pain syndrome: Secondary | ICD-10-CM | POA: Diagnosis not present

## 2018-10-22 DIAGNOSIS — Z7401 Bed confinement status: Secondary | ICD-10-CM | POA: Diagnosis not present

## 2018-10-22 DIAGNOSIS — E539 Vitamin B deficiency, unspecified: Secondary | ICD-10-CM | POA: Diagnosis not present

## 2018-10-22 DIAGNOSIS — Z7409 Other reduced mobility: Secondary | ICD-10-CM | POA: Diagnosis not present

## 2018-10-22 DIAGNOSIS — G8911 Acute pain due to trauma: Secondary | ICD-10-CM | POA: Diagnosis not present

## 2018-10-22 DIAGNOSIS — K219 Gastro-esophageal reflux disease without esophagitis: Secondary | ICD-10-CM | POA: Diagnosis not present

## 2018-10-22 DIAGNOSIS — J309 Allergic rhinitis, unspecified: Secondary | ICD-10-CM | POA: Diagnosis not present

## 2018-10-22 DIAGNOSIS — R41841 Cognitive communication deficit: Secondary | ICD-10-CM | POA: Diagnosis not present

## 2018-10-22 DIAGNOSIS — R52 Pain, unspecified: Secondary | ICD-10-CM | POA: Diagnosis not present

## 2018-10-22 DIAGNOSIS — E039 Hypothyroidism, unspecified: Secondary | ICD-10-CM | POA: Diagnosis not present

## 2018-10-22 DIAGNOSIS — Z1159 Encounter for screening for other viral diseases: Secondary | ICD-10-CM | POA: Diagnosis not present

## 2018-10-22 NOTE — Plan of Care (Signed)
  Problem: Pain Managment: Goal: General experience of comfort will improve Outcome: Progressing   Problem: Safety: Goal: Ability to remain free from injury will improve Outcome: Progressing   Problem: Skin Integrity: Goal: Risk for impaired skin integrity will decrease Outcome: Progressing   

## 2018-10-22 NOTE — Progress Notes (Addendum)
Patient continued to have pain in her right lower extremity.  Her vital signs have remained stable.  Her lungs are still clear to auscultation, heart S1-S2 present and rhythmic, abdomen soft, no lower extremity edema.  Acute right knee medial condyle insufficiency fracture.  Patient stable for discharge.  There have been no changes since discharge documentation from July 29.

## 2018-10-22 NOTE — Progress Notes (Signed)
DISCHARGE NOTE SNF [facility] to be discharged [facility] per MD order. Patient verbalized understanding.  Skin clean, dry and intact without evidence of skin break down, no evidence of skin tears noted. IV catheter discontinued intact. Site without signs and symptoms of complications. Dressing and pressure applied. Pt denies pain at the site currently. No complaints noted.  Patient free of lines, drains, and wounds.   Discharge packet assembled. An After Visit Summary (AVS) was printed and given to the EMS personnel. Patient escorted via stretcher and discharged to [facility] via ambulance. Report called to accepting facility; all questions and concerns addressed.   Dorthey Sawyer, RN

## 2018-10-22 NOTE — Progress Notes (Signed)
[facility] called for patient, family informed.   [place], [place]

## 2018-10-23 DIAGNOSIS — I10 Essential (primary) hypertension: Secondary | ICD-10-CM | POA: Diagnosis not present

## 2018-10-23 DIAGNOSIS — E785 Hyperlipidemia, unspecified: Secondary | ICD-10-CM | POA: Diagnosis not present

## 2018-10-23 DIAGNOSIS — K219 Gastro-esophageal reflux disease without esophagitis: Secondary | ICD-10-CM | POA: Diagnosis not present

## 2018-10-23 DIAGNOSIS — E539 Vitamin B deficiency, unspecified: Secondary | ICD-10-CM | POA: Diagnosis not present

## 2018-10-23 DIAGNOSIS — R197 Diarrhea, unspecified: Secondary | ICD-10-CM | POA: Diagnosis not present

## 2018-10-23 DIAGNOSIS — G2581 Restless legs syndrome: Secondary | ICD-10-CM | POA: Diagnosis not present

## 2018-10-23 DIAGNOSIS — E871 Hypo-osmolality and hyponatremia: Secondary | ICD-10-CM | POA: Diagnosis not present

## 2018-10-23 DIAGNOSIS — E559 Vitamin D deficiency, unspecified: Secondary | ICD-10-CM | POA: Diagnosis not present

## 2018-10-23 DIAGNOSIS — J3089 Other allergic rhinitis: Secondary | ICD-10-CM | POA: Diagnosis not present

## 2018-10-23 DIAGNOSIS — H8110 Benign paroxysmal vertigo, unspecified ear: Secondary | ICD-10-CM | POA: Diagnosis not present

## 2018-10-23 DIAGNOSIS — T782XXS Anaphylactic shock, unspecified, sequela: Secondary | ICD-10-CM | POA: Diagnosis not present

## 2018-10-23 DIAGNOSIS — E039 Hypothyroidism, unspecified: Secondary | ICD-10-CM | POA: Diagnosis not present

## 2018-10-27 DIAGNOSIS — H4089 Other specified glaucoma: Secondary | ICD-10-CM | POA: Diagnosis not present

## 2018-10-27 DIAGNOSIS — J3089 Other allergic rhinitis: Secondary | ICD-10-CM | POA: Diagnosis not present

## 2018-10-27 DIAGNOSIS — I4891 Unspecified atrial fibrillation: Secondary | ICD-10-CM | POA: Diagnosis not present

## 2018-10-27 DIAGNOSIS — M6281 Muscle weakness (generalized): Secondary | ICD-10-CM | POA: Diagnosis not present

## 2018-10-27 DIAGNOSIS — S72434D Nondisplaced fracture of medial condyle of right femur, subsequent encounter for closed fracture with routine healing: Secondary | ICD-10-CM | POA: Diagnosis not present

## 2018-10-27 DIAGNOSIS — Z7409 Other reduced mobility: Secondary | ICD-10-CM | POA: Diagnosis not present

## 2018-10-27 DIAGNOSIS — I10 Essential (primary) hypertension: Secondary | ICD-10-CM | POA: Diagnosis not present

## 2018-10-27 DIAGNOSIS — K219 Gastro-esophageal reflux disease without esophagitis: Secondary | ICD-10-CM | POA: Diagnosis not present

## 2018-10-27 DIAGNOSIS — E785 Hyperlipidemia, unspecified: Secondary | ICD-10-CM | POA: Diagnosis not present

## 2018-10-27 DIAGNOSIS — E871 Hypo-osmolality and hyponatremia: Secondary | ICD-10-CM | POA: Diagnosis not present

## 2018-10-27 DIAGNOSIS — G8911 Acute pain due to trauma: Secondary | ICD-10-CM | POA: Diagnosis not present

## 2018-10-27 DIAGNOSIS — E039 Hypothyroidism, unspecified: Secondary | ICD-10-CM | POA: Diagnosis not present

## 2018-10-28 ENCOUNTER — Other Ambulatory Visit: Payer: Self-pay | Admitting: Neurology

## 2018-10-30 DIAGNOSIS — M6281 Muscle weakness (generalized): Secondary | ICD-10-CM | POA: Diagnosis not present

## 2018-10-30 DIAGNOSIS — E039 Hypothyroidism, unspecified: Secondary | ICD-10-CM | POA: Diagnosis not present

## 2018-10-30 DIAGNOSIS — Z7409 Other reduced mobility: Secondary | ICD-10-CM | POA: Diagnosis not present

## 2018-10-30 DIAGNOSIS — E871 Hypo-osmolality and hyponatremia: Secondary | ICD-10-CM | POA: Diagnosis not present

## 2018-10-30 DIAGNOSIS — H4089 Other specified glaucoma: Secondary | ICD-10-CM | POA: Diagnosis not present

## 2018-10-30 DIAGNOSIS — I10 Essential (primary) hypertension: Secondary | ICD-10-CM | POA: Diagnosis not present

## 2018-10-30 DIAGNOSIS — I4891 Unspecified atrial fibrillation: Secondary | ICD-10-CM | POA: Diagnosis not present

## 2018-10-30 DIAGNOSIS — K219 Gastro-esophageal reflux disease without esophagitis: Secondary | ICD-10-CM | POA: Diagnosis not present

## 2018-10-30 DIAGNOSIS — G8911 Acute pain due to trauma: Secondary | ICD-10-CM | POA: Diagnosis not present

## 2018-10-30 DIAGNOSIS — J3089 Other allergic rhinitis: Secondary | ICD-10-CM | POA: Diagnosis not present

## 2018-10-30 DIAGNOSIS — S72434D Nondisplaced fracture of medial condyle of right femur, subsequent encounter for closed fracture with routine healing: Secondary | ICD-10-CM | POA: Diagnosis not present

## 2018-10-30 DIAGNOSIS — E785 Hyperlipidemia, unspecified: Secondary | ICD-10-CM | POA: Diagnosis not present

## 2018-11-03 DIAGNOSIS — M25561 Pain in right knee: Secondary | ICD-10-CM | POA: Diagnosis not present

## 2018-11-04 DIAGNOSIS — E785 Hyperlipidemia, unspecified: Secondary | ICD-10-CM | POA: Diagnosis not present

## 2018-11-04 DIAGNOSIS — J3089 Other allergic rhinitis: Secondary | ICD-10-CM | POA: Diagnosis not present

## 2018-11-04 DIAGNOSIS — E871 Hypo-osmolality and hyponatremia: Secondary | ICD-10-CM | POA: Diagnosis not present

## 2018-11-04 DIAGNOSIS — S72434D Nondisplaced fracture of medial condyle of right femur, subsequent encounter for closed fracture with routine healing: Secondary | ICD-10-CM | POA: Diagnosis not present

## 2018-11-04 DIAGNOSIS — G2581 Restless legs syndrome: Secondary | ICD-10-CM | POA: Diagnosis not present

## 2018-11-04 DIAGNOSIS — E539 Vitamin B deficiency, unspecified: Secondary | ICD-10-CM | POA: Diagnosis not present

## 2018-11-04 DIAGNOSIS — Z7409 Other reduced mobility: Secondary | ICD-10-CM | POA: Diagnosis not present

## 2018-11-04 DIAGNOSIS — K219 Gastro-esophageal reflux disease without esophagitis: Secondary | ICD-10-CM | POA: Diagnosis not present

## 2018-11-04 DIAGNOSIS — E039 Hypothyroidism, unspecified: Secondary | ICD-10-CM | POA: Diagnosis not present

## 2018-11-04 DIAGNOSIS — I4891 Unspecified atrial fibrillation: Secondary | ICD-10-CM | POA: Diagnosis not present

## 2018-11-04 DIAGNOSIS — M96661 Fracture of femur following insertion of orthopedic implant, joint prosthesis, or bone plate, right leg: Secondary | ICD-10-CM | POA: Diagnosis not present

## 2018-11-04 DIAGNOSIS — H4089 Other specified glaucoma: Secondary | ICD-10-CM | POA: Diagnosis not present

## 2018-11-19 DIAGNOSIS — M25561 Pain in right knee: Secondary | ICD-10-CM | POA: Diagnosis not present

## 2018-11-23 DIAGNOSIS — I4891 Unspecified atrial fibrillation: Secondary | ICD-10-CM | POA: Diagnosis not present

## 2018-11-23 DIAGNOSIS — M96661 Fracture of femur following insertion of orthopedic implant, joint prosthesis, or bone plate, right leg: Secondary | ICD-10-CM | POA: Diagnosis not present

## 2018-11-23 DIAGNOSIS — E539 Vitamin B deficiency, unspecified: Secondary | ICD-10-CM | POA: Diagnosis not present

## 2018-11-23 DIAGNOSIS — J3089 Other allergic rhinitis: Secondary | ICD-10-CM | POA: Diagnosis not present

## 2018-11-23 DIAGNOSIS — G2581 Restless legs syndrome: Secondary | ICD-10-CM | POA: Diagnosis not present

## 2018-11-23 DIAGNOSIS — G894 Chronic pain syndrome: Secondary | ICD-10-CM | POA: Diagnosis not present

## 2018-11-23 DIAGNOSIS — E039 Hypothyroidism, unspecified: Secondary | ICD-10-CM | POA: Diagnosis not present

## 2018-11-23 DIAGNOSIS — H4089 Other specified glaucoma: Secondary | ICD-10-CM | POA: Diagnosis not present

## 2018-11-23 DIAGNOSIS — E785 Hyperlipidemia, unspecified: Secondary | ICD-10-CM | POA: Diagnosis not present

## 2018-11-23 DIAGNOSIS — R197 Diarrhea, unspecified: Secondary | ICD-10-CM | POA: Diagnosis not present

## 2018-11-23 DIAGNOSIS — E871 Hypo-osmolality and hyponatremia: Secondary | ICD-10-CM | POA: Diagnosis not present

## 2018-11-23 DIAGNOSIS — Z7409 Other reduced mobility: Secondary | ICD-10-CM | POA: Diagnosis not present

## 2018-11-24 DIAGNOSIS — I4891 Unspecified atrial fibrillation: Secondary | ICD-10-CM | POA: Diagnosis not present

## 2018-11-24 DIAGNOSIS — E222 Syndrome of inappropriate secretion of antidiuretic hormone: Secondary | ICD-10-CM | POA: Diagnosis not present

## 2018-11-24 DIAGNOSIS — M6281 Muscle weakness (generalized): Secondary | ICD-10-CM | POA: Diagnosis not present

## 2018-11-24 DIAGNOSIS — J309 Allergic rhinitis, unspecified: Secondary | ICD-10-CM | POA: Diagnosis not present

## 2018-11-24 DIAGNOSIS — S72434D Nondisplaced fracture of medial condyle of right femur, subsequent encounter for closed fracture with routine healing: Secondary | ICD-10-CM | POA: Diagnosis not present

## 2018-11-24 DIAGNOSIS — Z7409 Other reduced mobility: Secondary | ICD-10-CM | POA: Diagnosis not present

## 2018-11-30 NOTE — Progress Notes (Deleted)
NEUROLOGY FOLLOW UP OFFICE NOTE  [name] [id number]  HISTORY OF PRESENT ILLNESS: [name] is an 85 year old right-handed female with hypertension, CAD, atrial fibrillation, hypercholesterolemia, type 2 diabetes mellitus, migraine, anxiety, sleep apnea and history of stroke with residual right-sided weakness, DVT and PE (on Lovenox) who follows up for mixed Alzheimer's and vascular dementia and right post-herpetic trigeminal neuralgia.  She is accompanied by her daughter who supplements history.  UPDATE: She was hospitalized on 10/18/2018 following a mechanical fall in which she turned and her legs got tangled.  She landed on her left sided, including hitting her head.  She sustained a closed fracture of the medial condyle of the right distal femur.  CT of head and cervical spine personally reviewed revealed no acute findings, such as hemorrhage or fracture.  ***  IRIGHT-SIDED POST-HERPETIC TRIGEMINAL NEURALGIA: History: Onset of symptoms started in 2006, following episode of herpes zoster involving she describes sharp pain involving the the right V1 and V2 distribution, associated with allodynia. Chewing, talking and brushing her teeth triggers and aggravates the pain.She was subsequently treated for postherpetic neuralgia.  Past medications:carbamazepine generic which led to breakthrough pain.  Update: Current medication:  Carbatrol 200mg  twice daily, [name] 300mg  twice daily.  Pain is controlled.  II.DEMENTIA: History: Memory deficits started around 2007. It was fairly sudden onset and has not progressed.She tends to repeat herself often, such as telling stories. She repeats questions often. She reports word-finding difficulties as well.She does have difficulty using the phone and managing her finances. She typically lives by herself in her one story house. She manages her own finances. She doesn't drive 2 to remote history of blackout spells. She  typically does not have problems recognizing people or recalling names. She denies any depression. She denies any hallucinations. She does have history of vivid nightmares.She reports a maternal aunt with history of dementia. To evaluate cognitive problems, an MRI of the brain was performed on 12/01/13, which revealed extensive small vessel ischemic changes throughout the deep and subcortical white matter, including remote small infarcts involving the cerebellum and left basal ganglia.Chronic micro-hemorrhages also noted, most notably in the right frontal lobe. No acute findings noted.  For several years, she has episodes of recurrent speech hesitancy.She knows what she wants to say but has trouble getting the words out.In the past, it has occurred in setting of UTI.To evaluate episodes of transient speech dysfunction, EEG was performed on 03/10/14, which was normal.She went to speech therapy without any real improvement. Therapist thinks it is likely medication-related.  She underwent neuropsychological testing on 01/12/15. Testing was consistent with mild dementia. She exhibited cognitive dysfunction involving the medial-temporal lobe, which is suggestive of Alzheimers disease. However, family endorses abrupt onset, which correlates with vascular etiology. Repeat MRI of the brain performed 02/22/15, stable compared to prior imaging from 12/01/13. She had repeat neurocognitive testing on 01/02/16. Cognitive deficits/mild dementia stable compared to one year ago.  Update: She takes Namzaric.She feels her memory is worse since last year.  She has to stop and think a lot.  She has increased word-finding difficulty.  She manages her medications independently.  Her daughter manages her finances.  Her math is good but had difficulty filling out a check.  She sees a therapist once a month, which is helpful.    PAST MEDICAL HISTORY: Past Medical History:  Diagnosis Date   A-fib ([place])      Acid reflux    Anxiety    Arthritis  Chronic anticoagulation 03/07/2013   Lovenox 100 QD   Chronic kidney disease    Stage III   CVA (cerebral infarction) 1964   Dementia [facility])    Dr Dohmeier   Diverticulosis    DVT (deep venous thrombosis) ([place]) 2007   recurrent w PE s/p [place] filter   GERD (gastroesophageal reflux disease)    w hiatal hernia endoscopy 2003, Dr Penelope Coop   Glaucoma    Hypercholesterolemia    Hypertension    Hypoglycemia    Hypothyroidism    goiter   IBS (irritable bowel syndrome)    Junctional bradycardia    resolved with discontinuation of sotalol (2014) after 10 yrs   Memory loss    Migraine    Osteoarthritis    in Bilateral knees and back. -Dr Latanya Maudlin   Osteoporosis    Panic attacks    Phlebitis    Postherpetic trigeminal neuralgia 10/20/2012   Pulmonary embolism recurrent    IVC filter   Shingles    zoster 1990 on the back and on the abdomen in 2007,facial zoster 2006   Trigeminal neuralgia    r face--Dr Dohmeier   Unspecified cerebral artery occlusion with cerebral infarction    Vitamin D deficiency     MEDICATIONS: Current Outpatient Medications on File Prior to Visit  Medication Sig Dispense Refill   amLODipine (NORVASC) 2.5 MG tablet Take 1 tablet (2.5 mg total) by mouth daily. Please make overdue appt for future refills. [phone]. 2nd attempt 15 tablet 0   CARBATROL 100 MG 12 hr capsule TAKE 2 CAPSULES BY MOUTH 2 TIMES DAILY (Patient taking differently: Take 200 mg by mouth 2 (two) times daily. ) 120 capsule 5   cholecalciferol (VITAMIN D) 1000 units tablet Take 1,000 Units by mouth daily.     enoxaparin (LOVENOX) 100 MG/ML injection Inject 100 mg into the skin at bedtime.      EPINEPHrine (EPIPEN 2-PAK) 0.3 mg/0.3 mL IJ SOAJ injection Inject 0.3 mg into the muscle as needed for anaphylaxis.     feeding supplement, ENSURE ENLIVE, (ENSURE ENLIVE) LIQD Take 237 mLs by mouth daily at 3 pm. 237 mL 12    fluticasone (FLONASE) 50 MCG/ACT nasal spray Place 1 spray into left nostril daily as needed for allergies or rhinitis.   12   folic acid (FOLVITE) 1 MG tablet Take 1 mg by mouth daily.      [name] 300 MG TABS TAKE 1 TABLET BY MOUTH 2 TIMES DAILY (Patient taking differently: Take 1 tablet by mouth 2 (two) times a day. ) 60 tablet 5   HYDROcodone-acetaminophen (NORCO/VICODIN) 5-325 MG tablet Take 1-2 tablets by mouth every 6 (six) hours as needed for moderate pain. 30 tablet 0   levothyroxine (SYNTHROID, LEVOTHROID) 75 MCG tablet Take 75 mcg by mouth daily before breakfast.      loperamide (IMODIUM A-D) 2 MG tablet Take 2 mg by mouth 4 (four) times daily as needed for diarrhea or loose stools.     loratadine (CLARITIN) 10 MG tablet Take 10 mg by mouth daily.     meclizine (ANTIVERT) 25 MG tablet Take 25 mg by mouth 2 (two) times daily as needed for dizziness or nausea.   1   Multiple Vitamin (MULTIVITAMIN WITH MINERALS) TABS tablet Take 1 tablet by mouth daily.     NAMZARIC 28-10 MG CP24 TAKE 1 CAPSULE BY MOUTH EVERY DAY (Patient taking differently: Take 1 capsule by mouth daily. ) 30 capsule 6   pravastatin (PRAVACHOL) 40 MG tablet Take  40 mg by mouth daily.      RABEprazole (ACIPHEX) 20 MG tablet Take 20 mg by mouth daily.      ROCKLATAN 0.02-0.005 % SOLN Place 1 drop into both eyes at bedtime.     sodium chloride 1 G tablet Take 1 tablet (1 g total) by mouth 2 (two) times daily with a meal. 60 tablet 0   No current facility-administered medications on file prior to visit.     ALLERGIES: Allergies  Allergen Reactions   Benadryl [Diphenhydramine Hcl] Hives and Shortness Of Breath   Eye Drops [Tetrahydrozoline Hcl] Other (See Comments)    Burning, itching, swelling   Flu Virus Vaccine Shortness Of Breath   Iohexol Hives and Shortness Of Breath     Code: HIVES, Desc: PT IS ALLERGIC TO IVP DYE/IODINE/SHRIMP.  CAUSES SOB AND HIVES PER PT.  STEPHANIE, RT-R, Onset Date:  [id number]    Shrimp [Shellfish Allergy] Anaphylaxis   Voltaren [Diclofenac Sodium] Other (See Comments)    Acid reflux symptoms   Ambien [Zolpidem Tartrate] Hives   Gluten Meal Diarrhea   Travatan Z [Travoprost] Other (See Comments)    BRAND NAME ONLY OLCANNOT TOLERATE GENERIC FORM OF EYE DROPS, IT CONTAINS PRESERVATIVES-SENSITIVITY   Ativan [Lorazepam]     delusional   Hydrocodone     Only on the higher dosages   Betadine Antibiotic-Moisturize [Bacitracin-Polymyxin B] Rash   Biaxin [Clarithromycin] Hives   Celebrex [Celecoxib] Rash   Ciprofloxacin Hcl Rash        Combigan [Brimonidine Tartrate-Timolol] Other (See Comments)    unknown   Coumadin [Warfarin Sodium] Rash   Ivp Dye [Iodinated Diagnostic Agents] Hives   Levaquin [Levofloxacin In D5w] Rash   Naprosyn [Naproxen] Rash   Pataday [Olopatadine Hcl] Other (See Comments)   Penicillins Hives    Has patient had a PCN reaction causing immediate rash, facial/tongue/throat swelling, SOB or lightheadedness with hypotension: Yes Has patient had a PCN reaction causing severe rash involving mucus membranes or skin necrosis: No Has patient had a PCN reaction that required hospitalization No Has patient had a PCN reaction occurring within the last 10 years: No If all of the above answers are "NO", then may proceed with Cephalosporin use.    Plavix [Clopidogrel Bisulfate] Rash   Septra [Sulfamethoxazole-Trimethoprim] Hives    FAMILY HISTORY: Family History  Problem Relation Age of Onset   Diabetes Mother    Hypertension Mother    Glaucoma Mother    CVA Mother    CAD Mother    Hypertension Father    Heart attack Father    Kidney failure Father    Heart attack Brother        2 half brothers   Hypertension Brother    CVA Brother    Glaucoma Other    Glaucoma Other    Prostate cancer Maternal Grandfather    Breast cancer Maternal Aunt    Colon cancer Maternal Uncle    Rectal cancer  Maternal Uncle    SOCIAL HISTORY: Social History   Socioeconomic History   Marital status: Divorced    Spouse name: Not on file   Number of children: 3   Years of education: 14   Highest education level: Not on file  Occupational History   Occupation: retired    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 strain: Not on file   Food insecurity    Worry: Not on file    Inability: Not  on file   Transportation needs    Medical: Not on file    Non-medical: Not on file  Tobacco Use   Smoking status: Never Smoker   Smokeless tobacco: Never Used  Substance and Sexual Activity   Alcohol use: No    Comment: very moderate, none  in the last two months   Drug use: No   Sexual activity: Never  Lifestyle   Physical activity    Days per week: Not on file    Minutes per session: Not on file   Stress: Not on file  Relationships   Social connections    Talks on phone: Not on file    Gets together: Not on file    Attends religious service: Not on file    Active member of club or organization: Not on file    Attends meetings of clubs or organizations: Not on file    Relationship status: Not on file   Intimate partner violence    Fear of current or ex partner: Not on file    Emotionally abused: Not on file    Physically abused: Not on file    Forced sexual activity: Not on file  Other Topics Concern   Not on file  Social History Narrative   Patient is retired and lives at home alone. Patient has a college education.    Patient has three children.   Patient is right-handed.   Patient drinks 1-2 cups of caffeine daily.    REVIEW OF SYSTEMS: Constitutional: No fevers, chills, or sweats, no generalized fatigue, change in appetite Eyes: No visual changes, double vision, eye pain Ear, nose and throat: No hearing loss, ear pain, nasal congestion, sore throat Cardiovascular: No chest pain, palpitations Respiratory:  No  shortness of breath at rest or with exertion, wheezes GastrointestinaI: No nausea, vomiting, diarrhea, abdominal pain, fecal incontinence Genitourinary:  No dysuria, urinary retention or frequency Musculoskeletal:  No neck pain, back pain Integumentary: No rash, pruritus, skin lesions Neurological: as above Psychiatric: No depression, insomnia, anxiety Endocrine: No palpitations, fatigue, diaphoresis, mood swings, change in appetite, change in weight, increased thirst Hematologic/Lymphatic:  No purpura, petechiae. Allergic/Immunologic: no itchy/runny eyes, nasal congestion, recent allergic reactions, rashes  PHYSICAL EXAM: *** General: No acute distress.  Patient appears well-groomed.   Head:  Normocephalic/atraumatic Eyes:  Fundi examined but not visualized Neck: supple, no paraspinal tenderness, full range of motion Heart:  Regular rate and rhythm Lungs:  Clear to auscultation bilaterally Back: No paraspinal tenderness Neurological Exam: alert and oriented to person, place, and time. Attention span and concentration intact, recent and remote memory intact, fund of knowledge intact.  Speech fluent and not dysarthric, language intact.  CN II-XII intact. Bulk and tone normal, muscle strength 5/5 throughout.  Sensation to light touch, temperature and vibration intact.  Deep tendon reflexes 2+ throughout, toes downgoing.  Finger to nose and heel to shin testing intact.  Gait normal, Romberg negative.  IMPRESSION: 1.  Mixed Alzheimer's and vascular dementia 2.  Right-sided postherpetic trigeminal neuralgia  PLAN: 1.  Carbatrol 200mg  twice daily; [name] 300mg  twice daily for neuropathic pain 2.  Namzaric 3.  Follow up in one year or as needed.  *** minutes spent face to face with patient, ***% spent discussing ***.  Metta Clines, DO  CC: [name], MD

## 2018-12-01 ENCOUNTER — Ambulatory Visit: Payer: Self-pay | Admitting: Neurology

## 2018-12-03 DIAGNOSIS — M25561 Pain in right knee: Secondary | ICD-10-CM | POA: Diagnosis not present

## 2018-12-07 DIAGNOSIS — M79673 Pain in unspecified foot: Secondary | ICD-10-CM | POA: Diagnosis not present

## 2018-12-07 DIAGNOSIS — F0390 Unspecified dementia without behavioral disturbance: Secondary | ICD-10-CM | POA: Diagnosis not present

## 2018-12-11 DIAGNOSIS — J309 Allergic rhinitis, unspecified: Secondary | ICD-10-CM | POA: Diagnosis not present

## 2018-12-11 DIAGNOSIS — E222 Syndrome of inappropriate secretion of antidiuretic hormone: Secondary | ICD-10-CM | POA: Diagnosis not present

## 2018-12-11 DIAGNOSIS — Z7409 Other reduced mobility: Secondary | ICD-10-CM | POA: Diagnosis not present

## 2018-12-11 DIAGNOSIS — M6281 Muscle weakness (generalized): Secondary | ICD-10-CM | POA: Diagnosis not present

## 2018-12-11 DIAGNOSIS — F0390 Unspecified dementia without behavioral disturbance: Secondary | ICD-10-CM | POA: Diagnosis not present

## 2018-12-11 DIAGNOSIS — S72434D Nondisplaced fracture of medial condyle of right femur, subsequent encounter for closed fracture with routine healing: Secondary | ICD-10-CM | POA: Diagnosis not present

## 2018-12-11 DIAGNOSIS — M79673 Pain in unspecified foot: Secondary | ICD-10-CM | POA: Diagnosis not present

## 2018-12-11 DIAGNOSIS — I4891 Unspecified atrial fibrillation: Secondary | ICD-10-CM | POA: Diagnosis not present

## 2018-12-11 DIAGNOSIS — E039 Hypothyroidism, unspecified: Secondary | ICD-10-CM | POA: Diagnosis not present

## 2018-12-11 DIAGNOSIS — R197 Diarrhea, unspecified: Secondary | ICD-10-CM | POA: Diagnosis not present

## 2018-12-11 DIAGNOSIS — G894 Chronic pain syndrome: Secondary | ICD-10-CM | POA: Diagnosis not present

## 2018-12-11 DIAGNOSIS — E539 Vitamin B deficiency, unspecified: Secondary | ICD-10-CM | POA: Diagnosis not present

## 2018-12-14 DIAGNOSIS — S72411S Displaced unspecified condyle fracture of lower end of right femur, sequela: Secondary | ICD-10-CM | POA: Diagnosis not present

## 2018-12-14 DIAGNOSIS — R2681 Unsteadiness on feet: Secondary | ICD-10-CM | POA: Diagnosis not present

## 2018-12-14 DIAGNOSIS — M6281 Muscle weakness (generalized): Secondary | ICD-10-CM | POA: Diagnosis not present

## 2018-12-14 DIAGNOSIS — M79651 Pain in right thigh: Secondary | ICD-10-CM | POA: Diagnosis not present

## 2018-12-15 DIAGNOSIS — M79651 Pain in right thigh: Secondary | ICD-10-CM | POA: Diagnosis not present

## 2018-12-15 DIAGNOSIS — S72411S Displaced unspecified condyle fracture of lower end of right femur, sequela: Secondary | ICD-10-CM | POA: Diagnosis not present

## 2018-12-15 DIAGNOSIS — R2681 Unsteadiness on feet: Secondary | ICD-10-CM | POA: Diagnosis not present

## 2018-12-15 DIAGNOSIS — M6281 Muscle weakness (generalized): Secondary | ICD-10-CM | POA: Diagnosis not present

## 2018-12-16 DIAGNOSIS — M810 Age-related osteoporosis without current pathological fracture: Secondary | ICD-10-CM | POA: Diagnosis not present

## 2018-12-16 DIAGNOSIS — Z8781 Personal history of (healed) traumatic fracture: Secondary | ICD-10-CM | POA: Diagnosis not present

## 2018-12-16 DIAGNOSIS — M6281 Muscle weakness (generalized): Secondary | ICD-10-CM | POA: Diagnosis not present

## 2018-12-16 DIAGNOSIS — N183 Chronic kidney disease, stage 3 (moderate): Secondary | ICD-10-CM | POA: Diagnosis not present

## 2018-12-16 DIAGNOSIS — I129 Hypertensive chronic kidney disease with stage 1 through stage 4 chronic kidney disease, or unspecified chronic kidney disease: Secondary | ICD-10-CM | POA: Diagnosis not present

## 2018-12-16 DIAGNOSIS — R2681 Unsteadiness on feet: Secondary | ICD-10-CM | POA: Diagnosis not present

## 2018-12-16 DIAGNOSIS — M79651 Pain in right thigh: Secondary | ICD-10-CM | POA: Diagnosis not present

## 2018-12-16 DIAGNOSIS — S7291XD Unspecified fracture of right femur, subsequent encounter for closed fracture with routine healing: Secondary | ICD-10-CM | POA: Diagnosis not present

## 2018-12-16 DIAGNOSIS — E785 Hyperlipidemia, unspecified: Secondary | ICD-10-CM | POA: Diagnosis not present

## 2018-12-16 DIAGNOSIS — S72411S Displaced unspecified condyle fracture of lower end of right femur, sequela: Secondary | ICD-10-CM | POA: Diagnosis not present

## 2018-12-17 DIAGNOSIS — R2681 Unsteadiness on feet: Secondary | ICD-10-CM | POA: Diagnosis not present

## 2018-12-17 DIAGNOSIS — S72411S Displaced unspecified condyle fracture of lower end of right femur, sequela: Secondary | ICD-10-CM | POA: Diagnosis not present

## 2018-12-17 DIAGNOSIS — M6281 Muscle weakness (generalized): Secondary | ICD-10-CM | POA: Diagnosis not present

## 2018-12-17 DIAGNOSIS — M79651 Pain in right thigh: Secondary | ICD-10-CM | POA: Diagnosis not present

## 2018-12-18 DIAGNOSIS — M79651 Pain in right thigh: Secondary | ICD-10-CM | POA: Diagnosis not present

## 2018-12-18 DIAGNOSIS — R2681 Unsteadiness on feet: Secondary | ICD-10-CM | POA: Diagnosis not present

## 2018-12-18 DIAGNOSIS — M6281 Muscle weakness (generalized): Secondary | ICD-10-CM | POA: Diagnosis not present

## 2018-12-18 DIAGNOSIS — S72411S Displaced unspecified condyle fracture of lower end of right femur, sequela: Secondary | ICD-10-CM | POA: Diagnosis not present

## 2018-12-21 DIAGNOSIS — S72411S Displaced unspecified condyle fracture of lower end of right femur, sequela: Secondary | ICD-10-CM | POA: Diagnosis not present

## 2018-12-21 DIAGNOSIS — M6281 Muscle weakness (generalized): Secondary | ICD-10-CM | POA: Diagnosis not present

## 2018-12-21 DIAGNOSIS — M79651 Pain in right thigh: Secondary | ICD-10-CM | POA: Diagnosis not present

## 2018-12-21 DIAGNOSIS — R2681 Unsteadiness on feet: Secondary | ICD-10-CM | POA: Diagnosis not present

## 2018-12-22 ENCOUNTER — Emergency Department (HOSPITAL_COMMUNITY): Payer: Medicare Other

## 2018-12-22 ENCOUNTER — Emergency Department (HOSPITAL_COMMUNITY)
Admission: EM | Admit: 2018-12-22 | Discharge: 2018-12-23 | Disposition: A | Discharge status: Home / Self Care | Payer: Medicare Other | Attending: Emergency Medicine | Admitting: Emergency Medicine

## 2018-12-22 DIAGNOSIS — R4182 Altered mental status, unspecified: Secondary | ICD-10-CM | POA: Diagnosis not present

## 2018-12-22 DIAGNOSIS — I10 Essential (primary) hypertension: Secondary | ICD-10-CM | POA: Insufficient documentation

## 2018-12-22 DIAGNOSIS — E039 Hypothyroidism, unspecified: Secondary | ICD-10-CM | POA: Diagnosis not present

## 2018-12-22 DIAGNOSIS — I6389 Other cerebral infarction: Secondary | ICD-10-CM | POA: Diagnosis not present

## 2018-12-22 DIAGNOSIS — M79651 Pain in right thigh: Secondary | ICD-10-CM | POA: Diagnosis not present

## 2018-12-22 DIAGNOSIS — N3000 Acute cystitis without hematuria: Secondary | ICD-10-CM | POA: Insufficient documentation

## 2018-12-22 DIAGNOSIS — S72411S Displaced unspecified condyle fracture of lower end of right femur, sequela: Secondary | ICD-10-CM | POA: Diagnosis not present

## 2018-12-22 DIAGNOSIS — R4781 Slurred speech: Secondary | ICD-10-CM | POA: Diagnosis not present

## 2018-12-22 DIAGNOSIS — Z7901 Long term (current) use of anticoagulants: Secondary | ICD-10-CM | POA: Insufficient documentation

## 2018-12-22 DIAGNOSIS — I639 Cerebral infarction, unspecified: Secondary | ICD-10-CM | POA: Insufficient documentation

## 2018-12-22 DIAGNOSIS — R410 Disorientation, unspecified: Secondary | ICD-10-CM | POA: Diagnosis not present

## 2018-12-22 DIAGNOSIS — R4020 Unspecified coma: Secondary | ICD-10-CM | POA: Diagnosis not present

## 2018-12-22 DIAGNOSIS — R0902 Hypoxemia: Secondary | ICD-10-CM | POA: Diagnosis not present

## 2018-12-22 DIAGNOSIS — F0390 Unspecified dementia without behavioral disturbance: Secondary | ICD-10-CM | POA: Diagnosis not present

## 2018-12-22 DIAGNOSIS — M6281 Muscle weakness (generalized): Secondary | ICD-10-CM | POA: Diagnosis not present

## 2018-12-22 DIAGNOSIS — Z79899 Other long term (current) drug therapy: Secondary | ICD-10-CM | POA: Insufficient documentation

## 2018-12-22 DIAGNOSIS — R079 Chest pain, unspecified: Secondary | ICD-10-CM | POA: Diagnosis not present

## 2018-12-22 DIAGNOSIS — R4701 Aphasia: Secondary | ICD-10-CM | POA: Insufficient documentation

## 2018-12-22 DIAGNOSIS — I959 Hypotension, unspecified: Secondary | ICD-10-CM | POA: Diagnosis not present

## 2018-12-22 DIAGNOSIS — R0602 Shortness of breath: Secondary | ICD-10-CM | POA: Diagnosis not present

## 2018-12-22 DIAGNOSIS — R2681 Unsteadiness on feet: Secondary | ICD-10-CM | POA: Diagnosis not present

## 2018-12-22 DIAGNOSIS — R29810 Facial weakness: Secondary | ICD-10-CM | POA: Diagnosis not present

## 2018-12-22 LAB — URINALYSIS, ROUTINE W REFLEX MICROSCOPIC
Bilirubin Urine: NEGATIVE
Glucose, UA: NEGATIVE mg/dL
Ketones, ur: NEGATIVE mg/dL
Nitrite: POSITIVE — AB
Protein, ur: NEGATIVE mg/dL
Specific Gravity, Urine: 1.008 (ref 1.005–1.030)
WBC, UA: >50 WBC/hpf — ABNORMAL HIGH (ref 0–5)
pH: 6 (ref 5.0–8.0)

## 2018-12-22 LAB — COMPREHENSIVE METABOLIC PANEL
ALT: 15 U/L (ref 0–44)
AST: 18 U/L (ref 15–41)
Albumin: 3.6 g/dL (ref 3.5–5.0)
Alkaline Phosphatase: 89 U/L (ref 38–126)
Anion gap: 12 (ref 5–15)
BUN: 26 mg/dL — ABNORMAL HIGH (ref 8–23)
CO2: 23 mmol/L (ref 22–32)
Calcium: 9.3 mg/dL (ref 8.9–10.3)
Chloride: 100 mmol/L (ref 98–111)
Creatinine, Ser: 1.68 mg/dL — ABNORMAL HIGH (ref 0.44–1.00)
GFR calc Af Amer: 31 mL/min — ABNORMAL LOW (ref >60)
GFR calc non Af Amer: 27 mL/min — ABNORMAL LOW (ref >60)
Glucose, Bld: 112 mg/dL — ABNORMAL HIGH (ref 70–99)
Potassium: 4.2 mmol/L (ref 3.5–5.1)
Sodium: 135 mmol/L (ref 135–145)
Total Bilirubin: 0.5 mg/dL (ref 0.3–1.2)
Total Protein: 6.7 g/dL (ref 6.5–8.1)

## 2018-12-22 LAB — CBC
HCT: 32.3 % — ABNORMAL LOW (ref 36.0–46.0)
Hemoglobin: 10.3 g/dL — ABNORMAL LOW (ref 12.0–15.0)
MCH: 26.5 pg (ref 26.0–34.0)
MCHC: 31.9 g/dL (ref 30.0–36.0)
MCV: 83 fL (ref 80.0–100.0)
Platelets: 168 10*3/uL (ref 150–400)
RBC: 3.89 MIL/uL (ref 3.87–5.11)
RDW: 17.2 % — ABNORMAL HIGH (ref 11.5–15.5)
WBC: 9.6 10*3/uL (ref 4.0–10.5)
nRBC: 0 % (ref 0.0–0.2)

## 2018-12-22 LAB — CBG MONITORING, ED: Glucose-Capillary: 87 mg/dL (ref 70–99)

## 2018-12-22 MED ORDER — SODIUM CHLORIDE 0.9% FLUSH: 3.0000 mL | Freq: Once | INTRAVENOUS | Status: DC

## 2018-12-22 MED ORDER — SODIUM CHLORIDE 0.9 % IV SOLN
1.0000 g | Freq: Two times a day (BID) | INTRAVENOUS | Status: DC
  Administered 2018-12-23: 1 g via INTRAVENOUS
  Filled 2018-12-22 (×2): qty 1

## 2018-12-22 NOTE — ED Triage Notes (Addendum)
BIB [facility] from local assisted living community c/o aphasia and altered mental status. EMS reported family noticed pt was having difficulty with articulation and asking questions that were not consistent with pt's baseline. Pt is alert and oriented, but was slow to answer certain questions, per EMS. Pt has hx of TIA (in her 18s) without lasting effect. No unilateral weakness noted by EMS. Pt undergoing workup for dementia, per family. Last seen normal was 1900 yesterday. Pt has no complaints at time of triage.

## 2018-12-23 ENCOUNTER — Ambulatory Visit: Payer: Medicare Other | Admitting: Podiatry

## 2018-12-23 ENCOUNTER — Emergency Department (HOSPITAL_COMMUNITY): Payer: Medicare Other

## 2018-12-23 ENCOUNTER — Encounter (HOSPITAL_COMMUNITY): Payer: Self-pay | Admitting: *Deleted

## 2018-12-23 DIAGNOSIS — R5381 Other malaise: Secondary | ICD-10-CM | POA: Diagnosis not present

## 2018-12-23 DIAGNOSIS — N3000 Acute cystitis without hematuria: Secondary | ICD-10-CM

## 2018-12-23 DIAGNOSIS — I639 Cerebral infarction, unspecified: Secondary | ICD-10-CM | POA: Diagnosis not present

## 2018-12-23 DIAGNOSIS — M2550 Pain in unspecified joint: Secondary | ICD-10-CM | POA: Diagnosis not present

## 2018-12-23 DIAGNOSIS — Z7401 Bed confinement status: Secondary | ICD-10-CM | POA: Diagnosis not present

## 2018-12-23 DIAGNOSIS — I6389 Other cerebral infarction: Secondary | ICD-10-CM | POA: Diagnosis not present

## 2018-12-23 MED ORDER — NITROFURANTOIN MONOHYD MACRO 100 MG PO CAPS: 100.0000 mg | ORAL_CAPSULE | Freq: Two times a day (BID) | ORAL | 0 refills | Status: DC

## 2018-12-23 NOTE — ED Notes (Signed)
Patient transported to MRI 

## 2018-12-23 NOTE — Discharge Instructions (Addendum)
Take the antibiotic as directed twice a day.  Follow-up with your doctor for recheck of the urine.  Urine culture is been sent.  Return for any new or worse symptoms.  There was very small infarct seen on MRI today on the right side of the brain.  Neurology has seen the patient.  This is unlikely the cause of any of her symptoms today.  You can follow-up with neurology as an outpatient.  Please continue your Eliquis as prescribed.

## 2018-12-23 NOTE — ED Provider Notes (Signed)
[facility] EMERGENCY DEPARTMENT Provider Note   CSN: [id number] Arrival date & time: 12/22/18  1842     History   Chief Complaint Chief Complaint  Patient presents with  . Altered Mental Status    HPI [name] is a 85 y.o. female.     Patient is from a assisted living community.  According to daughter onset of symptoms were yesterday at about 7 PM patient had some a aphasia had some confusion altered mental status.  EMS reported family noticed patient having difficulty with articulation asking questions that were not consistent with patient's baseline.  To EMS patient appeared to be alert and oriented.  Patient was slow to answer certain questions per EMS.  There was no focal weakness.  Patient is undergoing work-up for dementia per family.  Last seen normal was 7 PM yesterday.  Patient upon arrival here is speaking fine.  Does seem to have some degree of confusion.     Past Medical History:  Diagnosis Date  . A-fib ([place])   . Acid reflux   . Anxiety   . Arthritis   . Chronic anticoagulation 03/07/2013   Lovenox 100 QD  . Chronic kidney disease    Stage III  . CVA (cerebral infarction) 1964  . Dementia [facility])    Dr Brett Fairy  . Diverticulosis   . DVT (deep venous thrombosis) ([place]) 2007   recurrent w PE s/p [place] filter  . GERD (gastroesophageal reflux disease)    w hiatal hernia endoscopy 2003, Dr Penelope Coop  . Glaucoma   . Hypercholesterolemia   . Hypertension   . Hypoglycemia   . Hypothyroidism    goiter  . IBS (irritable bowel syndrome)   . Junctional bradycardia    resolved with discontinuation of sotalol (2014) after 10 yrs  . Memory loss   . Migraine   . Osteoarthritis    in Bilateral knees and back. -Dr Latanya Maudlin  . Osteoporosis   . Panic attacks   . Phlebitis   . Postherpetic trigeminal neuralgia 10/20/2012  . Pulmonary embolism recurrent    IVC filter  . Shingles    zoster 1990 on the back and on the abdomen in 2007,facial  zoster 2006  . Trigeminal neuralgia    r face--Dr Dohmeier  . Unspecified cerebral artery occlusion with cerebral infarction   . Vitamin D deficiency     Patient Active Problem List   Diagnosis Date Noted  . Closed fracture of medial condyle of distal femur ([place]) 10/19/2018  . Cognitive impairment 10/31/2014  . TIA (transient ischemic attack) 12/03/2013  . UTI (lower urinary tract infection) 12/03/2013  . SIADH (syndrome of inappropriate ADH production) ([place]) 08/07/2013  . Back pain 08/03/2013  . Closed left arm fracture 08/03/2013  . Essential hypertension 04/07/2013  . PAF (paroxysmal atrial fibrillation) ([place]) 03/07/2013  . Chronic anticoagulation 03/07/2013  . Obesity, unspecified 03/07/2013  . Nausea 03/07/2013  . Hypothyroidism 03/07/2013  . Bradycardia 03/06/2013  . Dermatophytosis of nail 12/30/2012  . 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 Unspecified cerebral artery occlusion with cerebral infarction   . Unspecified deficiency anemia 10/21/2012  . Postherpetic trigeminal neuralgia 10/20/2012    Past Surgical History:  Procedure Laterality Date  . APPENDECTOMY  1945  . biopsies of breast Bilateral 1971  . blood clots  06/2006   lower abdomen  . CARDIAC CATHETERIZATION  08/2001   A-fib/Palpitations Dr. Daneen Schick  . CHOLECYSTECTOMY  01/1983  . DILATION AND CURETTAGE, DIAGNOSTIC /  THERAPEUTIC  1952/1977   x2  . FOOT SURGERY Right 1978   Cyst removed from Right Foot  . [place]  . [place]  . insertion of vena cova filter  2007  . LEG / ANKLE SOFT TISSUE BIOPSY Right    precancerous  . migraine speech impairment  07/1963  . phlebitis  12/2005  . PULMONARY EMBOLISM SURGERY  03/2005  . TONSILLECTOMY     1954     OB History   No obstetric history on file.      Home Medications    Prior to Admission medications   Medication Sig Start Date End Date Taking? Authorizing Provider  amLODipine (NORVASC) 2.5  MG tablet Take 1 tablet (2.5 mg total) by mouth daily. Please make overdue appt for future refills. [phone]. 2nd attempt 05/20/18   Belva Crome, MD  CARBATROL 100 MG 12 hr capsule TAKE 2 CAPSULES BY MOUTH 2 TIMES DAILY Patient taking differently: Take 200 mg by mouth 2 (two) times daily.  07/09/18   Pieter Partridge, DO  cholecalciferol (VITAMIN D) 1000 units tablet Take 1,000 Units by mouth daily.    [provider]  enoxaparin (LOVENOX) 100 MG/ML injection Inject 100 mg into the skin at bedtime.     [provider]  EPINEPHrine (EPIPEN 2-PAK) 0.3 mg/0.3 mL IJ SOAJ injection Inject 0.3 mg into the muscle as needed for anaphylaxis.    [provider]  feeding supplement, ENSURE ENLIVE, (ENSURE ENLIVE) LIQD Take 237 mLs by mouth daily at 3 pm. 10/21/18   Swayze, Ava, DO  fluticasone (FLONASE) 50 MCG/ACT nasal spray Place 1 spray into left nostril daily as needed for allergies or rhinitis.  06/27/14   [provider]  folic acid (FOLVITE) 1 MG tablet Take 1 mg by mouth daily.     [provider]  [name] 300 MG TABS TAKE 1 TABLET BY MOUTH 2 TIMES DAILY Patient taking differently: Take 1 tablet by mouth 2 (two) times a day.  12/23/17   Pieter Partridge, DO  HYDROcodone-acetaminophen (NORCO/VICODIN) 5-325 MG tablet Take 1-2 tablets by mouth every 6 (six) hours as needed for moderate pain. 10/21/18   Swayze, Ava, DO  levothyroxine (SYNTHROID, LEVOTHROID) 75 MCG tablet Take 75 mcg by mouth daily before breakfast.     [provider]  loperamide (IMODIUM A-D) 2 MG tablet Take 2 mg by mouth 4 (four) times daily as needed for diarrhea or loose stools.    [provider]  loratadine (CLARITIN) 10 MG tablet Take 10 mg by mouth daily.    [provider]  meclizine (ANTIVERT) 25 MG tablet Take 25 mg by mouth 2 (two) times daily as needed for dizziness or nausea.  02/18/14   [provider]  Multiple Vitamin (MULTIVITAMIN WITH MINERALS)  TABS tablet Take 1 tablet by mouth daily.    [provider]  NAMZARIC 28-10 MG CP24 TAKE 1 CAPSULE BY MOUTH EVERY DAY Patient taking differently: Take 1 capsule by mouth daily.  12/30/16   Pieter Partridge, DO  nitrofurantoin, macrocrystal-monohydrate, (MACROBID) 100 MG capsule Take 1 capsule (100 mg total) by mouth 2 (two) times daily. 12/23/18   Fredia Sorrow, MD  pravastatin (PRAVACHOL) 40 MG tablet Take 40 mg by mouth daily.     [provider]  RABEprazole (ACIPHEX) 20 MG tablet Take 20 mg by mouth daily.     [provider]  ROCKLATAN 0.02-0.005 % SOLN Place 1 drop  into both eyes at bedtime. 10/09/18   [provider]  sodium chloride 1 G tablet Take 1 tablet (1 g total) by mouth 2 (two) times daily with a meal. 08/07/13   Donne Hazel, MD    Family History Family History  Problem Relation Age of Onset  . Diabetes Mother   . Hypertension Mother   . Glaucoma Mother   . CVA Mother   . CAD Mother   . Hypertension Father   . Heart attack Father   . Kidney failure Father   . Heart attack Brother        2 half brothers  . Hypertension Brother   . CVA Brother   . Glaucoma Other   . Glaucoma Other   . Prostate cancer Maternal Grandfather   . Breast cancer Maternal Aunt   . Colon cancer Maternal Uncle   . Rectal cancer Maternal Uncle     Social History Social History   Tobacco Use  . Smoking status: Never Smoker  . Smokeless tobacco: Never Used  Substance Use Topics  . Alcohol use: No    Comment: very moderate, none  in the last two months  . Drug use: No     Allergies   Benadryl [diphenhydramine hcl], Eye drops [tetrahydrozoline hcl], Flu virus vaccine, Iohexol, Shrimp [shellfish allergy], Voltaren [diclofenac sodium], Ambien [zolpidem tartrate], Gluten meal, Travatan z [travoprost], Ativan [lorazepam], Hydrocodone, Betadine antibiotic-moisturize [bacitracin-polymyxin b], Biaxin [clarithromycin], Celebrex [celecoxib], Ciprofloxacin hcl,  Combigan [brimonidine tartrate-timolol], Coumadin [warfarin sodium], Ivp dye [iodinated diagnostic agents], Levaquin [levofloxacin in d5w], Naprosyn [naproxen], Pataday [olopatadine hcl], Penicillins, Plavix [clopidogrel bisulfate], and Septra [sulfamethoxazole-trimethoprim]   Review of Systems Review of Systems  Unable to perform ROS: Dementia     Physical Exam Updated Vital Signs BP (!) 136/57 (BP Location: Right Arm)   Pulse 71   Temp 98 F (36.7 C) (Oral)   Resp 14   SpO2 95%   Physical Exam Vitals signs and nursing note reviewed.  Constitutional:      General: She is not in acute distress.    Appearance: Normal appearance. She is well-developed. She is not ill-appearing.  HENT:     Head: Normocephalic and atraumatic.  Eyes:     Extraocular Movements: Extraocular movements intact.     Conjunctiva/sclera: Conjunctivae normal.     Pupils: Pupils are equal, round, and reactive to light.  Neck:     Musculoskeletal: Normal range of motion and neck supple.  Cardiovascular:     Rate and Rhythm: Normal rate and regular rhythm.     Heart sounds: No murmur.  Pulmonary:     Effort: Pulmonary effort is normal. No respiratory distress.     Breath sounds: Normal breath sounds.  Abdominal:     Palpations: Abdomen is soft.     Tenderness: There is no abdominal tenderness.  Musculoskeletal: Normal range of motion.  Skin:    General: Skin is warm and dry.  Neurological:     General: No focal deficit present.     Mental Status: She is alert and oriented to person, place, and time.     Cranial Nerves: No cranial nerve deficit.     Sensory: No sensory deficit.     Motor: No weakness.      ED Treatments / Results  Labs (all labs ordered are listed, but only abnormal results are displayed) Labs Reviewed  COMPREHENSIVE METABOLIC PANEL - Abnormal; Notable for the following components:      Result Value  Glucose, Bld 112 (*)    BUN 26 (*)    Creatinine, Ser 1.68 (*)    GFR  calc non Af Amer 27 (*)    GFR calc Af Amer 31 (*)    All other components within normal limits  CBC - Abnormal; Notable for the following components:   Hemoglobin 10.3 (*)    HCT 32.3 (*)    RDW 17.2 (*)    All other components within normal limits  URINALYSIS, ROUTINE W REFLEX MICROSCOPIC - Abnormal; Notable for the following components:   APPearance CLOUDY (*)    Hgb urine dipstick SMALL (*)    Nitrite POSITIVE (*)    Leukocytes,Ua LARGE (*)    WBC, UA >50 (*)    Bacteria, UA MANY (*)    All other components within normal limits  URINE CULTURE  CBG MONITORING, ED    EKG None  Radiology Dg Chest 2 View  Result Date: 12/22/2018 CLINICAL DATA:  85 year old female with altered mental status, shortness of breath and chest pain. EXAM: CHEST - 2 VIEW COMPARISON:  10/19/2018 portable chest and earlier. FINDINGS: Semi upright AP and lateral views of the chest. Chronically exaggerated upper thoracic kyphosis with increased AP dimension to the chest. Stable cardiac size at the upper limits of normal. Other mediastinal contours are within normal limits. Visualized tracheal air column is within normal limits. Lower lung volumes compared to prior studies with no pneumothorax, pulmonary edema, pleural effusion or confluent pulmonary opacity. No acute osseous abnormality identified. Negative visible bowel gas pattern. Stable cholecystectomy clips. IMPRESSION: Lower lung volumes, no acute cardiopulmonary abnormality. Electronically Signed   By: Genevie Ann M.D.   On: 12/22/2018 20:13   Ct Head Wo Contrast  Result Date: 12/22/2018 CLINICAL DATA:  Focal neuro deficit for greater than 6 hours, altered level of consciousness EXAM: CT HEAD WITHOUT CONTRAST TECHNIQUE: Contiguous axial images were obtained from the base of the skull through the vertex without intravenous contrast. COMPARISON:  CT head 10/18/2018 FINDINGS: Brain: Stable regions of gliosis in the left cerebellar hemisphere, left thalamus and  basal ganglia. No evidence of acute infarction, hemorrhage, hydrocephalus, extra-axial collection or mass lesion/mass effect. Symmetric prominence of the ventricles, cisterns and sulci compatible with parenchymal volume loss. Patchy areas of white matter hypoattenuation are most compatible with chronic microvascular angiopathy. Vascular: Atherosclerotic calcification of the carotid siphons and intradural vertebral arteries. No hyperdense vessel. Skull: Bones are diffusely demineralized. No calvarial fracture or suspicious osseous lesion. Hyperostosis frontalis interna, a benign incidental finding. No scalp swelling or hematoma. Sinuses/Orbits: Paranasal sinuses and mastoid air cells are predominantly clear. Orbital structures are unremarkable aside from prior lens extractions. Other: None IMPRESSION: No acute intracranial abnormality. Stable parenchymal volume loss and chronic microvascular ischemic changes. Stable areas of remote infarct in the left hemisphere. Electronically Signed   By: Lovena Le M.D.   On: 12/22/2018 21:32    Procedures Procedures (including critical care time)  Medications Ordered in ED Medications  meropenem (MERREM) 1 g in sodium chloride 0.9 % 100 mL IVPB (has no administration in time range)     Initial Impression / Assessment and Plan / ED Course  I have reviewed the triage vital signs and the nursing notes.  Pertinent labs & imaging results that were available during my care of the patient were reviewed by me and considered in my medical decision making (see chart for details).        Patient's head CT without acute findings MRI was ordered  to rule out any kind of stroke.  Patient without any significant neuro deficits now with some generalized confusion.  But apparently there was some speech problem.  Urinalysis consistent with urinary tract infection.  Patient with a lots of a in the biotic allergies.  So she was treated here with carboplatinum.  Actually  meropenem seems to have been doing okay with that.  She will continue on nitro for tone twice daily she has had that before.  Urine sent for culture.  If MRI negative for stroke patient can be treated for the urinary tract infection.  Final Clinical Impressions(s) / ED Diagnoses   Final diagnoses:  Acute cystitis without hematuria  Altered mental status, unspecified altered mental status type  Aphasia    ED Discharge Orders         Ordered    nitrofurantoin, macrocrystal-monohydrate, (MACROBID) 100 MG capsule  2 times daily     12/23/18 0037           Fredia Sorrow, MD 12/23/18 [phone]

## 2018-12-23 NOTE — Consult Note (Signed)
Requesting Physician: Dr. Leonides Schanz    Chief Complaint: Confusion   History obtained from: Patient's daughter and Chart    HPI:                                                                                                                                       [name] is a 85 y.o. female right side weakness with past medical history of atrial fibrillation, hypertension, hyperlipidemia, DVT, stroke presents to the emergency department after daughter found her confused this morning and having difficulty with articulation.   According to her daughter, she spoke to the patient at 7 PM on 9/28 and she was fine.  She received a call at 4:30 in the morning which was unusual for her and she seemed a little confused.  She went to check on her mother who lives in assisted living facility and noticed that she had difficulty talking and appeared to be confused and brought her to the emergency department.  Work-up in the emergency department revealed a urinary tract infection.  MRI of the brain was performed given her significant stroke risk factors and revealed a punctate infarct in the right parietal lobe.  Patient is on Eliquis and is compliant.  Previously is to be on Lovenox on the DVT/hip fracture.     Past Medical History:  Diagnosis Date  . A-fib ([place])   . Acid reflux   . Anxiety   . Arthritis   . Chronic anticoagulation 03/07/2013   Lovenox 100 QD  . Chronic kidney disease    Stage III  . CVA (cerebral infarction) 1964  . Dementia [facility])    Dr Brett Fairy  . Diverticulosis   . DVT (deep venous thrombosis) ([place]) 2007   recurrent w PE s/p [place] filter  . GERD (gastroesophageal reflux disease)    w hiatal hernia endoscopy 2003, Dr Penelope Coop  . Glaucoma   . Hypercholesterolemia   . Hypertension   . Hypoglycemia   . Hypothyroidism    goiter  . IBS (irritable bowel syndrome)   . Junctional bradycardia    resolved with discontinuation of sotalol (2014) after 10 yrs  . Memory loss    . Migraine   . Osteoarthritis    in Bilateral knees and back. -Dr Latanya Maudlin  . Osteoporosis   . Panic attacks   . Phlebitis   . Postherpetic trigeminal neuralgia 10/20/2012  . Pulmonary embolism recurrent    IVC filter  . Shingles    zoster 1990 on the back and on the abdomen in 2007,facial zoster 2006  . Trigeminal neuralgia    r face--Dr Dohmeier  . Unspecified cerebral artery occlusion with cerebral infarction   . Vitamin D deficiency     Past Surgical History:  Procedure Laterality Date  . APPENDECTOMY  1945  . biopsies of breast Bilateral 1971  . blood clots  06/2006   lower abdomen  . CARDIAC  CATHETERIZATION  08/2001   A-fib/Palpitations Dr. Daneen Schick  . CHOLECYSTECTOMY  01/1983  . DILATION AND CURETTAGE, DIAGNOSTIC / THERAPEUTIC  1952/1977   x2  . FOOT SURGERY Right 1978   Cyst removed from Right Foot  . [place]an  . [place]  . insertion of vena cova filter  2007  . LEG / ANKLE SOFT TISSUE BIOPSY Right    precancerous  . migraine speech impairment  07/1963  . phlebitis  12/2005  . PULMONARY EMBOLISM SURGERY  03/2005  . TONSILLECTOMY     1954    Family History  Problem Relation Age of Onset  . Diabetes Mother   . Hypertension Mother   . Glaucoma Mother   . CVA Mother   . CAD Mother   . Hypertension Father   . Heart attack Father   . Kidney failure Father   . Heart attack Brother        2 half brothers  . Hypertension Brother   . CVA Brother   . Glaucoma Other   . Glaucoma Other   . Prostate cancer Maternal Grandfather   . Breast cancer Maternal Aunt   . Colon cancer Maternal Uncle   . Rectal cancer Maternal Uncle    Social History:  reports that she has never smoked. She has never used smokeless tobacco. She reports that she does not drink alcohol or use drugs.  Allergies:  Allergies  Allergen Reactions  . Benadryl [Diphenhydramine Hcl] Hives and Shortness Of Breath  . Eye Drops [Tetrahydrozoline Hcl] Other (See  Comments)    Burning, itching, swelling  . Flu Virus Vaccine Shortness Of Breath  . Iohexol Hives and Shortness Of Breath     Code: HIVES, Desc: PT IS ALLERGIC TO IVP DYE/IODINE/SHRIMP.  CAUSES SOB AND HIVES PER PT.  STEPHANIE, RT-R, Onset Date: [id number]   . Shrimp [Shellfish Allergy] Anaphylaxis  . Voltaren [Diclofenac Sodium] Other (See Comments)    Acid reflux symptoms  . Ambien [Zolpidem Tartrate] Hives  . Gluten Meal Diarrhea  . Travatan Z [Travoprost] Other (See Comments)    BRAND NAME ONLY OLCANNOT TOLERATE GENERIC FORM OF EYE DROPS, IT CONTAINS PRESERVATIVES-SENSITIVITY  . Ativan [Lorazepam]     delusional  . Hydrocodone     Only on the higher dosages  . Betadine Antibiotic-Moisturize [Bacitracin-Polymyxin B] Rash  . Biaxin [Clarithromycin] Hives  . Celebrex [Celecoxib] Rash  . Ciprofloxacin Hcl Rash       . Combigan [Brimonidine Tartrate-Timolol] Other (See Comments)    unknown  . Coumadin [Warfarin Sodium] Rash  . Ivp Dye [Iodinated Diagnostic Agents] Hives  . Levaquin [Levofloxacin In D5w] Rash  . Naprosyn [Naproxen] Rash  . Pataday [Olopatadine Hcl] Other (See Comments)  . Penicillins Hives    Has patient had a PCN reaction causing immediate rash, facial/tongue/throat swelling, SOB or lightheadedness with hypotension: Yes Has patient had a PCN reaction causing severe rash involving mucus membranes or skin necrosis: No Has patient had a PCN reaction that required hospitalization No Has patient had a PCN reaction occurring within the last 10 years: No If all of the above answers are "NO", then may proceed with Cephalosporin use.   Marland Kitchen Plavix [Clopidogrel Bisulfate] Rash  . Septra [Sulfamethoxazole-Trimethoprim] Hives    Medications:  I reviewed home medications   ROS:                                                                                                                                      14 systems reviewed and negative except above    Examination:                                                                                                      General: Appears well-developed  Psych: Affect appropriate to situation Eyes: No scleral injection HENT: No OP obstrucion Head: Normocephalic.  Cardiovascular: Normal rate and regular rhythm.  Respiratory: Effort normal and breath sounds normal to anterior ascultation GI: Soft.  No distension. There is no tenderness.  Skin: WDI    Neurological Examination Mental Status: Alert, oriented to herself but not place or month or age, slightly confused.  Speech fluent without evidence of aphasia. Able to follow simple commands without difficulty. Cranial Nerves: II: Visual fields grossly normal,  III,IV, VI: ptosis not present, extra-ocular motions intact bilaterally, pupils equal, round, reactive to light and accommodation V,VII: smile symmetric, facial light touch sensation normal bilaterally VIII: hearing normal bilaterally IX,X: uvula rises symmetrically XI: bilateral shoulder shrug XII: midline tongue extension Motor: Right : Upper extremity   4+/5    Left:     Upper extremity   4+/5  Lower extremity   4/5     Lower extremity   3/5 (limited with fracture)  Tone and bulk:normal tone throughout; no atrophy noted Sensory: Pinprick and light touch intact throughout, bilaterally Plantars: Right: downgoing   Left: downgoing Cerebellar: normal finger-to-nose      Lab Results: Basic Metabolic Panel: Recent Labs  Lab 12/22/18 2206  NA 135  K 4.2  CL 100  CO2 23  GLUCOSE 112*  BUN 26*  CREATININE 1.68*  CALCIUM 9.3    CBC: Recent Labs  Lab 12/22/18 2206  WBC 9.6  HGB 10.3*  HCT 32.3*  MCV 83.0  PLT 168    Coagulation Studies: No results for input(s): LABPROT, INR in the last 72 hours.  Imaging: Dg Chest 2 View  Result Date: 12/22/2018 CLINICAL  DATA:  85 year old female with altered mental status, shortness of breath and chest pain. EXAM: CHEST - 2 VIEW COMPARISON:  10/19/2018 portable chest and earlier. FINDINGS: Semi upright AP and lateral views of the chest. Chronically exaggerated upper thoracic kyphosis with increased AP dimension to the chest. Stable cardiac size at the upper limits of  normal. Other mediastinal contours are within normal limits. Visualized tracheal air column is within normal limits. Lower lung volumes compared to prior studies with no pneumothorax, pulmonary edema, pleural effusion or confluent pulmonary opacity. No acute osseous abnormality identified. Negative visible bowel gas pattern. Stable cholecystectomy clips. IMPRESSION: Lower lung volumes, no acute cardiopulmonary abnormality. Electronically Signed   By: Genevie Ann M.D.   On: 12/22/2018 20:13   Ct Head Wo Contrast  Result Date: 12/22/2018 CLINICAL DATA:  Focal neuro deficit for greater than 6 hours, altered level of consciousness EXAM: CT HEAD WITHOUT CONTRAST TECHNIQUE: Contiguous axial images were obtained from the base of the skull through the vertex without intravenous contrast. COMPARISON:  CT head 10/18/2018 FINDINGS: Brain: Stable regions of gliosis in the left cerebellar hemisphere, left thalamus and basal ganglia. No evidence of acute infarction, hemorrhage, hydrocephalus, extra-axial collection or mass lesion/mass effect. Symmetric prominence of the ventricles, cisterns and sulci compatible with parenchymal volume loss. Patchy areas of white matter hypoattenuation are most compatible with chronic microvascular angiopathy. Vascular: Atherosclerotic calcification of the carotid siphons and intradural vertebral arteries. No hyperdense vessel. Skull: Bones are diffusely demineralized. No calvarial fracture or suspicious osseous lesion. Hyperostosis frontalis interna, a benign incidental finding. No scalp swelling or hematoma. Sinuses/Orbits: Paranasal sinuses and  mastoid air cells are predominantly clear. Orbital structures are unremarkable aside from prior lens extractions. Other: None IMPRESSION: No acute intracranial abnormality. Stable parenchymal volume loss and chronic microvascular ischemic changes. Stable areas of remote infarct in the left hemisphere. Electronically Signed   By: Lovena Le M.D.   On: 12/22/2018 21:32   Mr Brain Wo Contrast (neuro Protocol)  Result Date: 12/23/2018 CLINICAL DATA:  85 year old female with altered mental status, aphasia. EXAM: MRI HEAD WITHOUT CONTRAST TECHNIQUE: Multiplanar, multiecho pulse sequences of the brain and surrounding structures were obtained without intravenous contrast. COMPARISON:  Head CT earlier tonight. Brain MRI 02/22/2015. FINDINGS: Brain: There is a punctate focus of restricted diffusion in the inferior right parietal lobe subcortical white matter on series 5 image 80. No other restricted diffusion or evidence of acute infarction. Small chronic infarcts in the left cerebellum are stable since 2016. Stable chronic lacunar infarct of the left corona radiata and basal ganglia. Chronic but increased Patchy and confluent bilateral cerebral white matter T2 and FLAIR hyperintensity elsewhere. Scattered chronic microhemorrhages in the brain are stable to mildly increased from 2016, but more apparent due to susceptibility imaging today. No cortical encephalomalacia. No midline shift, mass effect, evidence of mass lesion, ventriculomegaly, extra-axial collection or acute intracranial hemorrhage. Cervicomedullary junction and pituitary are within normal limits. Vascular: Major intracranial vascular flow voids are stable since 2016. Dominant right vertebral artery and mild intracranial artery tortuosity. Skull and upper cervical spine: Negative visible cervical spine. Visualized bone marrow signal is within normal limits. Sinuses/Orbits: Stable and negative. Other: Trace left mastoid fluid appears inconsequential. Right  mastoids remain clear. Scalp and face soft tissues appear negative. IMPRESSION: 1. Punctate acute infarct in the right parietal lobe subcortical white matter. No associated hemorrhage or mass effect. 2. Underlying advanced chronic small vessel disease - including chronic microhemorrhages - is stable to mildly progressed since 2016. Electronically Signed   By: Genevie Ann M.D.   On: 12/23/2018 00:53     ASSESSMENT AND PLAN   Incidental Right Parietal Infarction Encephalopathy likely secondary to UTI  Recommendations No need for further inpatient evaluation Continue Eliquis for secondary stroke prevention due to A. Fib BP goal normotension  Neurology follow up    [facility] Pager Number [phone]

## 2018-12-23 NOTE — ED Provider Notes (Signed)
12:15 AM  Assumed care from Dr. Rogene Houston.  Patient is an 85 year old female who presented to the emergency department with speech changes, confusion, generalized weakness today from assisted living facility.  No focal neurologic deficits here other than mild confusion.  She does have a urinary tract infection here and is receiving IV antibiotics.  MRI of the brain pending to rule out stroke.  If this is negative for acute abnormality, patient will be discharged back to her assisted living facility with Macrobid.  Daughter comfortable with plan.  2:10 AM  MRI brain shows punctate acute infarct in the right parietal lobe without hemorrhage or mass-effect.  She also has underlying advanced chronic small vessel disease with chronic microhemorrhages.  Discussed these findings with Dr. Lorraine Lax on-call for neurology.  Appreciate his help.  He has seen patient in consult.  He does not think that this small area is the cause of her confusion or speech changes.  She has no focal neurologic deficits currently and does not appear confused.  Patient and daughter comfortable with plan to be discharged back to [facility] assisted living facility.  She is already taking Eliquis for A. fib.  Neurology states patient can follow-up with outpatient neurology for further work-up as needed.  Plan was to discharge home on antibiotics for UTI.   At this time, I do not feel there is any life-threatening condition present. I have reviewed and discussed all results (EKG, imaging, lab, urine as appropriate) and exam findings with patient/family. I have reviewed nursing notes and appropriate previous records.  I feel the patient is safe to be discharged home without further emergent workup and can continue workup as an outpatient as needed. Discussed usual and customary return precautions. Patient/family verbalize understanding and are comfortable with this plan.  Outpatient follow-up has been provided as needed. All questions have been  answered.    Haylie Mccutcheon, Delice Bison, DO 12/23/18 [phone]

## 2018-12-24 DIAGNOSIS — S72411S Displaced unspecified condyle fracture of lower end of right femur, sequela: Secondary | ICD-10-CM | POA: Diagnosis not present

## 2018-12-24 DIAGNOSIS — M6281 Muscle weakness (generalized): Secondary | ICD-10-CM | POA: Diagnosis not present

## 2018-12-24 DIAGNOSIS — R2681 Unsteadiness on feet: Secondary | ICD-10-CM | POA: Diagnosis not present

## 2018-12-24 DIAGNOSIS — M79651 Pain in right thigh: Secondary | ICD-10-CM | POA: Diagnosis not present

## 2018-12-25 ENCOUNTER — Telehealth: Payer: Self-pay | Admitting: Neurology

## 2018-12-25 DIAGNOSIS — R2681 Unsteadiness on feet: Secondary | ICD-10-CM | POA: Diagnosis not present

## 2018-12-25 DIAGNOSIS — S72411S Displaced unspecified condyle fracture of lower end of right femur, sequela: Secondary | ICD-10-CM | POA: Diagnosis not present

## 2018-12-25 DIAGNOSIS — M79651 Pain in right thigh: Secondary | ICD-10-CM | POA: Diagnosis not present

## 2018-12-25 DIAGNOSIS — M6281 Muscle weakness (generalized): Secondary | ICD-10-CM | POA: Diagnosis not present

## 2018-12-25 LAB — URINE CULTURE: Culture: >=100000 — AB

## 2018-12-25 NOTE — Telephone Encounter (Signed)
Daughter is calling in about mother being over at [facility] and she is wanting Dr. Tomi Likens to review notes and let her know what needs to be done next. They thought she had a potential stroke and she is wanting to know if she needs to make or an appt or if there is any other testing he recommends. Thanks!

## 2018-12-25 NOTE — Telephone Encounter (Signed)
Called daughter [name] and relayed MD's info below to her. She stated that was the impression she got in the ER and thanked me for calling.

## 2018-12-25 NOTE — Telephone Encounter (Signed)
I reviewed the ED note and the MRI.  The confusion is likely due to UTI.  The MRI shows a very tiny stroke, but I think it is an incidental finding.  It is very small and I don't believe requires further testing.

## 2018-12-26 ENCOUNTER — Telehealth (HOSPITAL_COMMUNITY): Payer: Self-pay | Admitting: Pharmacist

## 2018-12-26 NOTE — Progress Notes (Signed)
ED Antimicrobial Stewardship Positive Culture Follow Up   [name] is an 85 y.o. female who presented to [facility] on (Not on file) with a chief complaint of No chief complaint on file.   Recent Results (from the past 720 hour(s))  Urine Culture     Status: Abnormal   Collection Time: 12/22/18 10:06 PM   Specimen: Urine, Random  Result Value Ref Range Status   Specimen Description URINE, RANDOM  Final   Special Requests   Final    NONE Performed at [facility], [address], [place], [place] [address]    Culture >=100,000 COLONIES/mL KLEBSIELLA PNEUMONIAE (A)  Final   Report Status 12/25/2018 FINAL  Final   Organism ID, Bacteria KLEBSIELLA PNEUMONIAE (A)  Final      Susceptibility   Klebsiella pneumoniae - MIC*    AMPICILLIN >=32 RESISTANT Resistant     CEFAZOLIN <=4 SENSITIVE Sensitive     CEFTRIAXONE <=1 SENSITIVE Sensitive     CIPROFLOXACIN <=0.25 SENSITIVE Sensitive     GENTAMICIN <=1 SENSITIVE Sensitive     IMIPENEM 0.5 SENSITIVE Sensitive     NITROFURANTOIN 64 INTERMEDIATE Intermediate     TRIMETH/SULFA <=20 SENSITIVE Sensitive     AMPICILLIN/SULBACTAM 8 SENSITIVE Sensitive     PIP/TAZO <=4 SENSITIVE Sensitive     Extended ESBL NEGATIVE Sensitive     * >=100,000 COLONIES/mL KLEBSIELLA PNEUMONIAE    **Please do symptom check for this patient. If she still has urinary symptoms, please change to the prescription below.  New antibiotic prescription: Keflex 500 mg po BID x 7 days  ED Provider: Donnelly Angelica, PA-C   MastersJake Church 12/26/2018, 11:19 AM Clinical Pharmacist Monday - Friday phone -  [phone] Saturday - Sunday phone - [phone]

## 2018-12-27 ENCOUNTER — Telehealth: Payer: Self-pay | Admitting: Emergency Medicine

## 2018-12-27 NOTE — Telephone Encounter (Signed)
Post ED Visit - Positive Culture Follow-up: Successful Patient Follow-Up  Culture assessed and recommendations reviewed by:  []  Elenor Quinones, Pharm.D. []  Heide Guile, Pharm.D., BCPS AQ-ID []  Parks Neptune, Pharm.D., BCPS []  Alycia Rossetti, Pharm.D., BCPS []  Raymond, Pharm.D., BCPS, AAHIVP []  Legrand Como, Pharm.D., BCPS, AAHIVP []  Salome Arnt, PharmD, BCPS []  Johnnette Gourd, PharmD, BCPS [x]  Hughes Better, PharmD, BCPS []  Leeroy Cha, PharmD  Positive urine culture  []  Patient discharged without antimicrobial prescription and treatment is now indicated [x]  Organism is resistant to prescribed ED discharge antimicrobial []  Patient with positive blood cultures  Changes discussed with ED provider: Martinique Robinson, PA New antibiotic prescription: Keflex 500 mg PO BID x seven days Called to [place] [phone])  Contacted patient's daughter [name],  date 12/27/2018, time Zeb 12/27/2018, 4:03 PM

## 2018-12-27 NOTE — Telephone Encounter (Signed)
Post ED Visit - Positive Culture Follow-up: Unsuccessful Patient Follow-up  Culture assessed and recommendations reviewed by:  []  Elenor Quinones, Pharm.D. []  Heide Guile, Pharm.D., BCPS AQ-ID []  Parks Neptune, Pharm.D., BCPS []  Alycia Rossetti, Pharm.D., BCPS []  [place], [place].D., BCPS, AAHIVP []  Legrand Como, Pharm.D., BCPS, AAHIVP [x]  Hughes Better, PharmD []  Vincenza Hews, PharmD, BCPS  Positive urine culture  []  Patient discharged without antimicrobial prescription and treatment is now indicated [x]  Organism is resistant to prescribed ED discharge antimicrobial []  Patient with positive blood cultures   Unable to contact patient/daugher @ number on file - left voicemail, contacted [facility] rehab who reported patient had been d/c'd home from their facility last week. Letter will be sent to address on file  Milus Mallick 12/27/2018, 3:33 PM

## 2018-12-28 DIAGNOSIS — M6281 Muscle weakness (generalized): Secondary | ICD-10-CM | POA: Diagnosis not present

## 2018-12-28 DIAGNOSIS — M79651 Pain in right thigh: Secondary | ICD-10-CM | POA: Diagnosis not present

## 2018-12-28 DIAGNOSIS — R2681 Unsteadiness on feet: Secondary | ICD-10-CM | POA: Diagnosis not present

## 2018-12-28 DIAGNOSIS — S72411S Displaced unspecified condyle fracture of lower end of right femur, sequela: Secondary | ICD-10-CM | POA: Diagnosis not present

## 2018-12-29 DIAGNOSIS — M6281 Muscle weakness (generalized): Secondary | ICD-10-CM | POA: Diagnosis not present

## 2018-12-29 DIAGNOSIS — M79651 Pain in right thigh: Secondary | ICD-10-CM | POA: Diagnosis not present

## 2018-12-29 DIAGNOSIS — S72411S Displaced unspecified condyle fracture of lower end of right femur, sequela: Secondary | ICD-10-CM | POA: Diagnosis not present

## 2018-12-29 DIAGNOSIS — R2681 Unsteadiness on feet: Secondary | ICD-10-CM | POA: Diagnosis not present

## 2018-12-30 DIAGNOSIS — M79651 Pain in right thigh: Secondary | ICD-10-CM | POA: Diagnosis not present

## 2018-12-30 DIAGNOSIS — S72411S Displaced unspecified condyle fracture of lower end of right femur, sequela: Secondary | ICD-10-CM | POA: Diagnosis not present

## 2018-12-30 DIAGNOSIS — R2681 Unsteadiness on feet: Secondary | ICD-10-CM | POA: Diagnosis not present

## 2018-12-30 DIAGNOSIS — M6281 Muscle weakness (generalized): Secondary | ICD-10-CM | POA: Diagnosis not present

## 2018-12-31 DIAGNOSIS — M6281 Muscle weakness (generalized): Secondary | ICD-10-CM | POA: Diagnosis not present

## 2018-12-31 DIAGNOSIS — M79651 Pain in right thigh: Secondary | ICD-10-CM | POA: Diagnosis not present

## 2018-12-31 DIAGNOSIS — S72411S Displaced unspecified condyle fracture of lower end of right femur, sequela: Secondary | ICD-10-CM | POA: Diagnosis not present

## 2018-12-31 DIAGNOSIS — R2681 Unsteadiness on feet: Secondary | ICD-10-CM | POA: Diagnosis not present

## 2019-01-01 DIAGNOSIS — S72411S Displaced unspecified condyle fracture of lower end of right femur, sequela: Secondary | ICD-10-CM | POA: Diagnosis not present

## 2019-01-01 DIAGNOSIS — M6281 Muscle weakness (generalized): Secondary | ICD-10-CM | POA: Diagnosis not present

## 2019-01-01 DIAGNOSIS — R2681 Unsteadiness on feet: Secondary | ICD-10-CM | POA: Diagnosis not present

## 2019-01-01 DIAGNOSIS — M79651 Pain in right thigh: Secondary | ICD-10-CM | POA: Diagnosis not present

## 2019-01-04 DIAGNOSIS — S72411S Displaced unspecified condyle fracture of lower end of right femur, sequela: Secondary | ICD-10-CM | POA: Diagnosis not present

## 2019-01-04 DIAGNOSIS — R2681 Unsteadiness on feet: Secondary | ICD-10-CM | POA: Diagnosis not present

## 2019-01-04 DIAGNOSIS — M79651 Pain in right thigh: Secondary | ICD-10-CM | POA: Diagnosis not present

## 2019-01-04 DIAGNOSIS — M6281 Muscle weakness (generalized): Secondary | ICD-10-CM | POA: Diagnosis not present

## 2019-01-05 DIAGNOSIS — R2681 Unsteadiness on feet: Secondary | ICD-10-CM | POA: Diagnosis not present

## 2019-01-05 DIAGNOSIS — M79651 Pain in right thigh: Secondary | ICD-10-CM | POA: Diagnosis not present

## 2019-01-05 DIAGNOSIS — S72411S Displaced unspecified condyle fracture of lower end of right femur, sequela: Secondary | ICD-10-CM | POA: Diagnosis not present

## 2019-01-05 DIAGNOSIS — N390 Urinary tract infection, site not specified: Secondary | ICD-10-CM | POA: Diagnosis not present

## 2019-01-05 DIAGNOSIS — M6281 Muscle weakness (generalized): Secondary | ICD-10-CM | POA: Diagnosis not present

## 2019-01-06 DIAGNOSIS — M79651 Pain in right thigh: Secondary | ICD-10-CM | POA: Diagnosis not present

## 2019-01-06 DIAGNOSIS — S72411S Displaced unspecified condyle fracture of lower end of right femur, sequela: Secondary | ICD-10-CM | POA: Diagnosis not present

## 2019-01-06 DIAGNOSIS — R2681 Unsteadiness on feet: Secondary | ICD-10-CM | POA: Diagnosis not present

## 2019-01-06 DIAGNOSIS — M6281 Muscle weakness (generalized): Secondary | ICD-10-CM | POA: Diagnosis not present

## 2019-01-07 DIAGNOSIS — R2681 Unsteadiness on feet: Secondary | ICD-10-CM | POA: Diagnosis not present

## 2019-01-07 DIAGNOSIS — M79651 Pain in right thigh: Secondary | ICD-10-CM | POA: Diagnosis not present

## 2019-01-07 DIAGNOSIS — M6281 Muscle weakness (generalized): Secondary | ICD-10-CM | POA: Diagnosis not present

## 2019-01-07 DIAGNOSIS — S72411S Displaced unspecified condyle fracture of lower end of right femur, sequela: Secondary | ICD-10-CM | POA: Diagnosis not present

## 2019-01-08 DIAGNOSIS — R2681 Unsteadiness on feet: Secondary | ICD-10-CM | POA: Diagnosis not present

## 2019-01-08 DIAGNOSIS — M6281 Muscle weakness (generalized): Secondary | ICD-10-CM | POA: Diagnosis not present

## 2019-01-08 DIAGNOSIS — M79651 Pain in right thigh: Secondary | ICD-10-CM | POA: Diagnosis not present

## 2019-01-08 DIAGNOSIS — S72411S Displaced unspecified condyle fracture of lower end of right femur, sequela: Secondary | ICD-10-CM | POA: Diagnosis not present

## 2019-01-11 DIAGNOSIS — M6281 Muscle weakness (generalized): Secondary | ICD-10-CM | POA: Diagnosis not present

## 2019-01-11 DIAGNOSIS — M79651 Pain in right thigh: Secondary | ICD-10-CM | POA: Diagnosis not present

## 2019-01-11 DIAGNOSIS — S72411S Displaced unspecified condyle fracture of lower end of right femur, sequela: Secondary | ICD-10-CM | POA: Diagnosis not present

## 2019-01-11 DIAGNOSIS — R2681 Unsteadiness on feet: Secondary | ICD-10-CM | POA: Diagnosis not present

## 2019-01-12 DIAGNOSIS — M25561 Pain in right knee: Secondary | ICD-10-CM | POA: Diagnosis not present

## 2019-01-12 DIAGNOSIS — R2681 Unsteadiness on feet: Secondary | ICD-10-CM | POA: Diagnosis not present

## 2019-01-12 DIAGNOSIS — M6281 Muscle weakness (generalized): Secondary | ICD-10-CM | POA: Diagnosis not present

## 2019-01-12 DIAGNOSIS — S72411S Displaced unspecified condyle fracture of lower end of right femur, sequela: Secondary | ICD-10-CM | POA: Diagnosis not present

## 2019-01-12 DIAGNOSIS — M79651 Pain in right thigh: Secondary | ICD-10-CM | POA: Diagnosis not present

## 2019-01-13 DIAGNOSIS — S72411S Displaced unspecified condyle fracture of lower end of right femur, sequela: Secondary | ICD-10-CM | POA: Diagnosis not present

## 2019-01-13 DIAGNOSIS — M6281 Muscle weakness (generalized): Secondary | ICD-10-CM | POA: Diagnosis not present

## 2019-01-13 DIAGNOSIS — R2681 Unsteadiness on feet: Secondary | ICD-10-CM | POA: Diagnosis not present

## 2019-01-13 DIAGNOSIS — M79651 Pain in right thigh: Secondary | ICD-10-CM | POA: Diagnosis not present

## 2019-01-14 DIAGNOSIS — R2681 Unsteadiness on feet: Secondary | ICD-10-CM | POA: Diagnosis not present

## 2019-01-14 DIAGNOSIS — M6281 Muscle weakness (generalized): Secondary | ICD-10-CM | POA: Diagnosis not present

## 2019-01-14 DIAGNOSIS — M79651 Pain in right thigh: Secondary | ICD-10-CM | POA: Diagnosis not present

## 2019-01-14 DIAGNOSIS — S72411S Displaced unspecified condyle fracture of lower end of right femur, sequela: Secondary | ICD-10-CM | POA: Diagnosis not present

## 2019-01-15 DIAGNOSIS — M6281 Muscle weakness (generalized): Secondary | ICD-10-CM | POA: Diagnosis not present

## 2019-01-15 DIAGNOSIS — R2681 Unsteadiness on feet: Secondary | ICD-10-CM | POA: Diagnosis not present

## 2019-01-15 DIAGNOSIS — S72411S Displaced unspecified condyle fracture of lower end of right femur, sequela: Secondary | ICD-10-CM | POA: Diagnosis not present

## 2019-01-15 DIAGNOSIS — M79651 Pain in right thigh: Secondary | ICD-10-CM | POA: Diagnosis not present

## 2019-01-18 DIAGNOSIS — M79651 Pain in right thigh: Secondary | ICD-10-CM | POA: Diagnosis not present

## 2019-01-18 DIAGNOSIS — M6281 Muscle weakness (generalized): Secondary | ICD-10-CM | POA: Diagnosis not present

## 2019-01-18 DIAGNOSIS — S72411S Displaced unspecified condyle fracture of lower end of right femur, sequela: Secondary | ICD-10-CM | POA: Diagnosis not present

## 2019-01-18 DIAGNOSIS — R2681 Unsteadiness on feet: Secondary | ICD-10-CM | POA: Diagnosis not present

## 2019-01-19 DIAGNOSIS — M6281 Muscle weakness (generalized): Secondary | ICD-10-CM | POA: Diagnosis not present

## 2019-01-19 DIAGNOSIS — M79651 Pain in right thigh: Secondary | ICD-10-CM | POA: Diagnosis not present

## 2019-01-19 DIAGNOSIS — S72411S Displaced unspecified condyle fracture of lower end of right femur, sequela: Secondary | ICD-10-CM | POA: Diagnosis not present

## 2019-01-19 DIAGNOSIS — R2681 Unsteadiness on feet: Secondary | ICD-10-CM | POA: Diagnosis not present

## 2019-01-20 DIAGNOSIS — M6281 Muscle weakness (generalized): Secondary | ICD-10-CM | POA: Diagnosis not present

## 2019-01-20 DIAGNOSIS — S72411S Displaced unspecified condyle fracture of lower end of right femur, sequela: Secondary | ICD-10-CM | POA: Diagnosis not present

## 2019-01-20 DIAGNOSIS — R2681 Unsteadiness on feet: Secondary | ICD-10-CM | POA: Diagnosis not present

## 2019-01-20 DIAGNOSIS — M79651 Pain in right thigh: Secondary | ICD-10-CM | POA: Diagnosis not present

## 2019-01-20 DIAGNOSIS — R300 Dysuria: Secondary | ICD-10-CM | POA: Diagnosis not present

## 2019-01-21 DIAGNOSIS — M79651 Pain in right thigh: Secondary | ICD-10-CM | POA: Diagnosis not present

## 2019-01-21 DIAGNOSIS — M6281 Muscle weakness (generalized): Secondary | ICD-10-CM | POA: Diagnosis not present

## 2019-01-21 DIAGNOSIS — S72411S Displaced unspecified condyle fracture of lower end of right femur, sequela: Secondary | ICD-10-CM | POA: Diagnosis not present

## 2019-01-21 DIAGNOSIS — R2681 Unsteadiness on feet: Secondary | ICD-10-CM | POA: Diagnosis not present

## 2019-01-22 DIAGNOSIS — M6281 Muscle weakness (generalized): Secondary | ICD-10-CM | POA: Diagnosis not present

## 2019-01-22 DIAGNOSIS — M79651 Pain in right thigh: Secondary | ICD-10-CM | POA: Diagnosis not present

## 2019-01-22 DIAGNOSIS — S72411S Displaced unspecified condyle fracture of lower end of right femur, sequela: Secondary | ICD-10-CM | POA: Diagnosis not present

## 2019-01-22 DIAGNOSIS — R2681 Unsteadiness on feet: Secondary | ICD-10-CM | POA: Diagnosis not present

## 2019-01-25 DIAGNOSIS — M6281 Muscle weakness (generalized): Secondary | ICD-10-CM | POA: Diagnosis not present

## 2019-01-25 DIAGNOSIS — R2681 Unsteadiness on feet: Secondary | ICD-10-CM | POA: Diagnosis not present

## 2019-01-25 DIAGNOSIS — S72411S Displaced unspecified condyle fracture of lower end of right femur, sequela: Secondary | ICD-10-CM | POA: Diagnosis not present

## 2019-01-25 DIAGNOSIS — M79651 Pain in right thigh: Secondary | ICD-10-CM | POA: Diagnosis not present

## 2019-01-26 DIAGNOSIS — M79651 Pain in right thigh: Secondary | ICD-10-CM | POA: Diagnosis not present

## 2019-01-26 DIAGNOSIS — R2681 Unsteadiness on feet: Secondary | ICD-10-CM | POA: Diagnosis not present

## 2019-01-26 DIAGNOSIS — M6281 Muscle weakness (generalized): Secondary | ICD-10-CM | POA: Diagnosis not present

## 2019-01-26 DIAGNOSIS — S72411S Displaced unspecified condyle fracture of lower end of right femur, sequela: Secondary | ICD-10-CM | POA: Diagnosis not present

## 2019-01-27 DIAGNOSIS — R2681 Unsteadiness on feet: Secondary | ICD-10-CM | POA: Diagnosis not present

## 2019-01-27 DIAGNOSIS — M6281 Muscle weakness (generalized): Secondary | ICD-10-CM | POA: Diagnosis not present

## 2019-01-27 DIAGNOSIS — S72411S Displaced unspecified condyle fracture of lower end of right femur, sequela: Secondary | ICD-10-CM | POA: Diagnosis not present

## 2019-01-27 DIAGNOSIS — M79651 Pain in right thigh: Secondary | ICD-10-CM | POA: Diagnosis not present

## 2019-01-28 DIAGNOSIS — S72411S Displaced unspecified condyle fracture of lower end of right femur, sequela: Secondary | ICD-10-CM | POA: Diagnosis not present

## 2019-01-28 DIAGNOSIS — M6281 Muscle weakness (generalized): Secondary | ICD-10-CM | POA: Diagnosis not present

## 2019-01-28 DIAGNOSIS — R2681 Unsteadiness on feet: Secondary | ICD-10-CM | POA: Diagnosis not present

## 2019-01-28 DIAGNOSIS — M79651 Pain in right thigh: Secondary | ICD-10-CM | POA: Diagnosis not present

## 2019-01-29 DIAGNOSIS — R2681 Unsteadiness on feet: Secondary | ICD-10-CM | POA: Diagnosis not present

## 2019-01-29 DIAGNOSIS — M6281 Muscle weakness (generalized): Secondary | ICD-10-CM | POA: Diagnosis not present

## 2019-01-29 DIAGNOSIS — M79651 Pain in right thigh: Secondary | ICD-10-CM | POA: Diagnosis not present

## 2019-01-29 DIAGNOSIS — S72411S Displaced unspecified condyle fracture of lower end of right femur, sequela: Secondary | ICD-10-CM | POA: Diagnosis not present

## 2019-02-01 ENCOUNTER — Telehealth: Payer: Self-pay | Admitting: Neurology

## 2019-02-01 DIAGNOSIS — M6281 Muscle weakness (generalized): Secondary | ICD-10-CM | POA: Diagnosis not present

## 2019-02-01 DIAGNOSIS — R2681 Unsteadiness on feet: Secondary | ICD-10-CM | POA: Diagnosis not present

## 2019-02-01 DIAGNOSIS — M79651 Pain in right thigh: Secondary | ICD-10-CM | POA: Diagnosis not present

## 2019-02-01 DIAGNOSIS — S72411S Displaced unspecified condyle fracture of lower end of right femur, sequela: Secondary | ICD-10-CM | POA: Diagnosis not present

## 2019-02-01 NOTE — Telephone Encounter (Signed)
Will you please call the patient and schedule an follow up appt. Dr. Tomi Likens will only give one refill of her [name] and her Namzaric. The appt can be in office or virtual. I sent in the medications to [facility]. Thank you.

## 2019-02-01 NOTE — Telephone Encounter (Signed)
She will need to make a follow up appointment (virtual or in-office).  We can provide one refill for both.  For gabapentin, we specifically prescribe [name] (a specific brand name).

## 2019-02-01 NOTE — Telephone Encounter (Signed)
Yes ma'am. I left a message for the patient to call back and schedule an appointment. Thank you

## 2019-02-01 NOTE — Telephone Encounter (Signed)
Pt was last seen 11/2017. Are you ok with sending refills of Gabapentin? Need clarification if pt needs follow up appt?

## 2019-02-02 DIAGNOSIS — M6281 Muscle weakness (generalized): Secondary | ICD-10-CM | POA: Diagnosis not present

## 2019-02-02 DIAGNOSIS — M79651 Pain in right thigh: Secondary | ICD-10-CM | POA: Diagnosis not present

## 2019-02-02 DIAGNOSIS — R2681 Unsteadiness on feet: Secondary | ICD-10-CM | POA: Diagnosis not present

## 2019-02-02 DIAGNOSIS — S72411S Displaced unspecified condyle fracture of lower end of right femur, sequela: Secondary | ICD-10-CM | POA: Diagnosis not present

## 2019-02-03 DIAGNOSIS — R2681 Unsteadiness on feet: Secondary | ICD-10-CM | POA: Diagnosis not present

## 2019-02-03 DIAGNOSIS — S72411S Displaced unspecified condyle fracture of lower end of right femur, sequela: Secondary | ICD-10-CM | POA: Diagnosis not present

## 2019-02-03 DIAGNOSIS — M79651 Pain in right thigh: Secondary | ICD-10-CM | POA: Diagnosis not present

## 2019-02-03 DIAGNOSIS — M6281 Muscle weakness (generalized): Secondary | ICD-10-CM | POA: Diagnosis not present

## 2019-02-04 DIAGNOSIS — S72411S Displaced unspecified condyle fracture of lower end of right femur, sequela: Secondary | ICD-10-CM | POA: Diagnosis not present

## 2019-02-04 DIAGNOSIS — R2681 Unsteadiness on feet: Secondary | ICD-10-CM | POA: Diagnosis not present

## 2019-02-04 DIAGNOSIS — M79651 Pain in right thigh: Secondary | ICD-10-CM | POA: Diagnosis not present

## 2019-02-04 DIAGNOSIS — M6281 Muscle weakness (generalized): Secondary | ICD-10-CM | POA: Diagnosis not present

## 2019-02-05 ENCOUNTER — Telehealth: Payer: Self-pay | Admitting: Neurology

## 2019-02-05 DIAGNOSIS — S72411S Displaced unspecified condyle fracture of lower end of right femur, sequela: Secondary | ICD-10-CM | POA: Diagnosis not present

## 2019-02-05 DIAGNOSIS — M6281 Muscle weakness (generalized): Secondary | ICD-10-CM | POA: Diagnosis not present

## 2019-02-05 DIAGNOSIS — M79651 Pain in right thigh: Secondary | ICD-10-CM | POA: Diagnosis not present

## 2019-02-05 DIAGNOSIS — R2681 Unsteadiness on feet: Secondary | ICD-10-CM | POA: Diagnosis not present

## 2019-02-05 MED ORDER — GABAPENTIN 300 MG PO CAPS: 300.0000 mg | ORAL_CAPSULE | Freq: Two times a day (BID) | ORAL | 11 refills | Status: DC

## 2019-02-05 NOTE — Telephone Encounter (Signed)
Spoke with pharmacy they states to send in Rx for GABAPENTIN 300 MG CAPSULES not Garlise 300 mg. Pharmacy will not cover GARLISE are you ok with these changes

## 2019-02-05 NOTE — Telephone Encounter (Signed)
Noted Rx sent for GABAPENTIN 300 MG BID to [facility]

## 2019-02-05 NOTE — Telephone Encounter (Signed)
That is fine 

## 2019-02-05 NOTE — Telephone Encounter (Signed)
Patient's pharm is calling in about the gabapentin medication- she said it is being sent in for the [name] 300 mg but her insurance doesn't cover that- they do cover 300 mg capsules. She said it needs to be updated in her chart to send like that from now on. Thanks!

## 2019-02-08 DIAGNOSIS — M6281 Muscle weakness (generalized): Secondary | ICD-10-CM | POA: Diagnosis not present

## 2019-02-08 DIAGNOSIS — R2681 Unsteadiness on feet: Secondary | ICD-10-CM | POA: Diagnosis not present

## 2019-02-08 DIAGNOSIS — M79651 Pain in right thigh: Secondary | ICD-10-CM | POA: Diagnosis not present

## 2019-02-08 DIAGNOSIS — S72411S Displaced unspecified condyle fracture of lower end of right femur, sequela: Secondary | ICD-10-CM | POA: Diagnosis not present

## 2019-02-09 DIAGNOSIS — M1711 Unilateral primary osteoarthritis, right knee: Secondary | ICD-10-CM | POA: Diagnosis not present

## 2019-02-10 DIAGNOSIS — M6281 Muscle weakness (generalized): Secondary | ICD-10-CM | POA: Diagnosis not present

## 2019-02-10 DIAGNOSIS — S72411S Displaced unspecified condyle fracture of lower end of right femur, sequela: Secondary | ICD-10-CM | POA: Diagnosis not present

## 2019-02-10 DIAGNOSIS — M79651 Pain in right thigh: Secondary | ICD-10-CM | POA: Diagnosis not present

## 2019-02-10 DIAGNOSIS — R2681 Unsteadiness on feet: Secondary | ICD-10-CM | POA: Diagnosis not present

## 2019-02-11 ENCOUNTER — Telehealth: Payer: Self-pay

## 2019-02-11 DIAGNOSIS — S72411S Displaced unspecified condyle fracture of lower end of right femur, sequela: Secondary | ICD-10-CM | POA: Diagnosis not present

## 2019-02-11 DIAGNOSIS — R2681 Unsteadiness on feet: Secondary | ICD-10-CM | POA: Diagnosis not present

## 2019-02-11 DIAGNOSIS — M79651 Pain in right thigh: Secondary | ICD-10-CM | POA: Diagnosis not present

## 2019-02-11 DIAGNOSIS — M6281 Muscle weakness (generalized): Secondary | ICD-10-CM | POA: Diagnosis not present

## 2019-02-11 NOTE — Telephone Encounter (Signed)
[name] prior authorization done via phone call.  Ref# [id number].

## 2019-02-12 DIAGNOSIS — M6281 Muscle weakness (generalized): Secondary | ICD-10-CM | POA: Diagnosis not present

## 2019-02-12 DIAGNOSIS — S72411S Displaced unspecified condyle fracture of lower end of right femur, sequela: Secondary | ICD-10-CM | POA: Diagnosis not present

## 2019-02-12 DIAGNOSIS — R2681 Unsteadiness on feet: Secondary | ICD-10-CM | POA: Diagnosis not present

## 2019-02-12 DIAGNOSIS — M79651 Pain in right thigh: Secondary | ICD-10-CM | POA: Diagnosis not present

## 2019-02-15 DIAGNOSIS — S72411S Displaced unspecified condyle fracture of lower end of right femur, sequela: Secondary | ICD-10-CM | POA: Diagnosis not present

## 2019-02-15 DIAGNOSIS — M79651 Pain in right thigh: Secondary | ICD-10-CM | POA: Diagnosis not present

## 2019-02-15 DIAGNOSIS — M6281 Muscle weakness (generalized): Secondary | ICD-10-CM | POA: Diagnosis not present

## 2019-02-15 DIAGNOSIS — R2681 Unsteadiness on feet: Secondary | ICD-10-CM | POA: Diagnosis not present

## 2019-02-15 NOTE — Progress Notes (Signed)
HARD FAX FROM [employer] PHONE: [phone]  APPROVED [name] 300 MG  The authorization is valid from 02/11/19 through 02/10/2021.  If you are changing therapy the previously approved therapy will be canceled and replaced.

## 2019-02-16 DIAGNOSIS — S72411S Displaced unspecified condyle fracture of lower end of right femur, sequela: Secondary | ICD-10-CM | POA: Diagnosis not present

## 2019-02-16 DIAGNOSIS — R2681 Unsteadiness on feet: Secondary | ICD-10-CM | POA: Diagnosis not present

## 2019-02-16 DIAGNOSIS — M79651 Pain in right thigh: Secondary | ICD-10-CM | POA: Diagnosis not present

## 2019-02-16 DIAGNOSIS — M6281 Muscle weakness (generalized): Secondary | ICD-10-CM | POA: Diagnosis not present

## 2019-02-17 DIAGNOSIS — S72411S Displaced unspecified condyle fracture of lower end of right femur, sequela: Secondary | ICD-10-CM | POA: Diagnosis not present

## 2019-02-17 DIAGNOSIS — R2681 Unsteadiness on feet: Secondary | ICD-10-CM | POA: Diagnosis not present

## 2019-02-17 DIAGNOSIS — M79651 Pain in right thigh: Secondary | ICD-10-CM | POA: Diagnosis not present

## 2019-02-17 DIAGNOSIS — M6281 Muscle weakness (generalized): Secondary | ICD-10-CM | POA: Diagnosis not present

## 2019-02-22 DIAGNOSIS — R2681 Unsteadiness on feet: Secondary | ICD-10-CM | POA: Diagnosis not present

## 2019-02-22 DIAGNOSIS — H401133 Primary open-angle glaucoma, bilateral, severe stage: Secondary | ICD-10-CM | POA: Diagnosis not present

## 2019-02-22 DIAGNOSIS — M6281 Muscle weakness (generalized): Secondary | ICD-10-CM | POA: Diagnosis not present

## 2019-02-22 DIAGNOSIS — S72411S Displaced unspecified condyle fracture of lower end of right femur, sequela: Secondary | ICD-10-CM | POA: Diagnosis not present

## 2019-02-22 DIAGNOSIS — M79651 Pain in right thigh: Secondary | ICD-10-CM | POA: Diagnosis not present

## 2019-03-02 ENCOUNTER — Other Ambulatory Visit: Payer: Self-pay | Admitting: Neurology

## 2019-03-02 NOTE — Telephone Encounter (Signed)
Requested Prescriptions   Pending Prescriptions Disp Refills  . NAMZARIC 28-10 MG CP24 [Pharmacy Med Name: Namzaric 28 mg-10 mg capsule sprinkle,extended release] 30 capsule 0    Sig: TAKE 1 CAPSULE BY MOUTH EVERY DAY   Rx last filled:02/01/19 #30 0 refills  Pt last seen:11/27/17  Follow up appt scheduled:NONE patient was to follow up in 1 year after last appt  NO ADDITIONAL REFILLS UNTIL PATIENT IS SEEN IN OFFICE OR VV APPT

## 2019-03-09 ENCOUNTER — Emergency Department (HOSPITAL_COMMUNITY): Payer: Medicare Other

## 2019-03-09 ENCOUNTER — Emergency Department (HOSPITAL_COMMUNITY)
Admission: EM | Admit: 2019-03-09 | Discharge: 2019-03-10 | Disposition: A | Discharge status: Home / Self Care | Payer: Medicare Other | Attending: Emergency Medicine | Admitting: Emergency Medicine

## 2019-03-09 ENCOUNTER — Other Ambulatory Visit: Payer: Self-pay

## 2019-03-09 DIAGNOSIS — M6281 Muscle weakness (generalized): Secondary | ICD-10-CM | POA: Insufficient documentation

## 2019-03-09 DIAGNOSIS — N390 Urinary tract infection, site not specified: Secondary | ICD-10-CM | POA: Insufficient documentation

## 2019-03-09 DIAGNOSIS — E039 Hypothyroidism, unspecified: Secondary | ICD-10-CM | POA: Insufficient documentation

## 2019-03-09 DIAGNOSIS — N183 Chronic kidney disease, stage 3 unspecified: Secondary | ICD-10-CM | POA: Diagnosis not present

## 2019-03-09 DIAGNOSIS — F0390 Unspecified dementia without behavioral disturbance: Secondary | ICD-10-CM | POA: Diagnosis not present

## 2019-03-09 DIAGNOSIS — Z8673 Personal history of transient ischemic attack (TIA), and cerebral infarction without residual deficits: Secondary | ICD-10-CM | POA: Diagnosis not present

## 2019-03-09 DIAGNOSIS — Z79899 Other long term (current) drug therapy: Secondary | ICD-10-CM | POA: Diagnosis not present

## 2019-03-09 DIAGNOSIS — R479 Unspecified speech disturbances: Secondary | ICD-10-CM | POA: Diagnosis present

## 2019-03-09 DIAGNOSIS — I129 Hypertensive chronic kidney disease with stage 1 through stage 4 chronic kidney disease, or unspecified chronic kidney disease: Secondary | ICD-10-CM | POA: Diagnosis not present

## 2019-03-09 DIAGNOSIS — R531 Weakness: Secondary | ICD-10-CM

## 2019-03-09 LAB — URINALYSIS, ROUTINE W REFLEX MICROSCOPIC
Bilirubin Urine: NEGATIVE
Glucose, UA: NEGATIVE mg/dL
Ketones, ur: NEGATIVE mg/dL
Nitrite: NEGATIVE
Protein, ur: 30 mg/dL — AB
Specific Gravity, Urine: 1.008 (ref 1.005–1.030)
WBC, UA: >50 WBC/hpf — ABNORMAL HIGH (ref 0–5)
pH: 7 (ref 5.0–8.0)

## 2019-03-09 LAB — CBC
HCT: 29.3 % — ABNORMAL LOW (ref 36.0–46.0)
Hemoglobin: 9.3 g/dL — ABNORMAL LOW (ref 12.0–15.0)
MCH: 26.7 pg (ref 26.0–34.0)
MCHC: 31.7 g/dL (ref 30.0–36.0)
MCV: 84.2 fL (ref 80.0–100.0)
Platelets: 196 10*3/uL (ref 150–400)
RBC: 3.48 MIL/uL — ABNORMAL LOW (ref 3.87–5.11)
RDW: 17.4 % — ABNORMAL HIGH (ref 11.5–15.5)
WBC: 10.9 10*3/uL — ABNORMAL HIGH (ref 4.0–10.5)
nRBC: 0 % (ref 0.0–0.2)

## 2019-03-09 LAB — COMPREHENSIVE METABOLIC PANEL
ALT: 14 U/L (ref 0–44)
AST: 16 U/L (ref 15–41)
Albumin: 3.6 g/dL (ref 3.5–5.0)
Alkaline Phosphatase: 79 U/L (ref 38–126)
Anion gap: 9 (ref 5–15)
BUN: 22 mg/dL (ref 8–23)
CO2: 27 mmol/L (ref 22–32)
Calcium: 9.3 mg/dL (ref 8.9–10.3)
Chloride: 99 mmol/L (ref 98–111)
Creatinine, Ser: 1.45 mg/dL — ABNORMAL HIGH (ref 0.44–1.00)
GFR calc Af Amer: 37 mL/min — ABNORMAL LOW (ref >60)
GFR calc non Af Amer: 32 mL/min — ABNORMAL LOW (ref >60)
Glucose, Bld: 107 mg/dL — ABNORMAL HIGH (ref 70–99)
Potassium: 5.1 mmol/L (ref 3.5–5.1)
Sodium: 135 mmol/L (ref 135–145)
Total Bilirubin: 0.2 mg/dL — ABNORMAL LOW (ref 0.3–1.2)
Total Protein: 6.8 g/dL (ref 6.5–8.1)

## 2019-03-09 LAB — PROTIME-INR
INR: 1.3 — ABNORMAL HIGH (ref 0.8–1.2)
Prothrombin Time: 16 seconds — ABNORMAL HIGH (ref 11.4–15.2)

## 2019-03-09 MED ORDER — NITROFURANTOIN MONOHYD MACRO 100 MG PO CAPS
100.0000 mg | ORAL_CAPSULE | Freq: Once | ORAL | Status: AC
  Administered 2019-03-10: 100 mg via ORAL
  Filled 2019-03-09: qty 1

## 2019-03-09 MED ORDER — SODIUM CHLORIDE 0.9% FLUSH: 3.0000 mL | Freq: Once | INTRAVENOUS | Status: DC

## 2019-03-09 NOTE — ED Provider Notes (Signed)
[facility] EMERGENCY DEPARTMENT Provider Note   CSN: [id number] Arrival date & time: 03/09/19  1349     History Chief Complaint  Patient presents with  . Tremors    [name] is a 85 y.o. female.  HPI      85yo female with history of patient, CKD, CVA, dementia, DVT, hypertension, hyperlipidemia,  presents with concern for progressive worsening of speech, generalized weakness, right sided weakness noted today with difficulty feeding herself.   Daughter reports patient lives at [facility] independent living but with 24hr care. She is able to help with transfers but has not been ambulatory, had a fracture of right leg in July and prior arthritis there. She has dementia and has progressively been getting worse. Hx of UTIs and is now on 250mg  empiric keflex.   This AM, seemed sleepier, falling asleep during breakfast, this afternoon was not able to feed herself, appearing to have weakness of the right arm. No facial droop noted. Son had reported to daughter that she sounded like she had slurred speech when he spoke with her around noon. Has had some history of speech and memory problems but appeared to have more word finding difficulties today.  Unknown if unilateral leg weakness as pt nonambulatory. No other new falls or other concerns. No cough, CP, dyspnea, abdominal pain, headache.   History from patient is limited, reports right leg pain (daughter reports this is chronic from prior fx) Past Medical History:  Diagnosis Date  . A-fib ([place])   . Acid reflux   . Anxiety   . Arthritis   . Chronic anticoagulation 03/07/2013   Lovenox 100 QD  . Chronic kidney disease    Stage III  . CVA (cerebral infarction) 1964  . Dementia [facility])    Dr Brett Fairy  . Diverticulosis   . DVT (deep venous thrombosis) ([place]) 2007   recurrent w PE s/p [place] filter  . GERD (gastroesophageal reflux disease)    w hiatal hernia endoscopy 2003, Dr Penelope Coop  . Glaucoma   .  Hypercholesterolemia   . Hypertension   . Hypoglycemia   . Hypothyroidism    goiter  . IBS (irritable bowel syndrome)   . Junctional bradycardia    resolved with discontinuation of sotalol (2014) after 10 yrs  . Memory loss   . Migraine   . Osteoarthritis    in Bilateral knees and back. -Dr Latanya Maudlin  . Osteoporosis   . Panic attacks   . Phlebitis   . Postherpetic trigeminal neuralgia 10/20/2012  . Pulmonary embolism recurrent    IVC filter  . Shingles    zoster 1990 on the back and on the abdomen in 2007,facial zoster 2006  . Trigeminal neuralgia    r face--Dr Dohmeier  . Unspecified cerebral artery occlusion with cerebral infarction   . Vitamin D deficiency     Patient Active Problem List   Diagnosis Date Noted  . Closed fracture of medial condyle of distal femur ([place]) 10/19/2018  . Cognitive impairment 10/31/2014  . TIA (transient ischemic attack) 12/03/2013  . UTI (lower urinary tract infection) 12/03/2013  . SIADH (syndrome of inappropriate ADH production) ([place]) 08/07/2013  . Back pain 08/03/2013  . Closed left arm fracture 08/03/2013  . Essential hypertension 04/07/2013  . PAF (paroxysmal atrial fibrillation) ([place]) 03/07/2013  . Chronic anticoagulation 03/07/2013  . Obesity, unspecified 03/07/2013  . Nausea 03/07/2013  . Hypothyroidism 03/07/2013  . Bradycardia 03/06/2013  . Dermatophytosis of nail 12/30/2012  . 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 Unspecified cerebral artery occlusion with cerebral infarction   . Unspecified deficiency anemia 10/21/2012  . Postherpetic trigeminal neuralgia 10/20/2012    Past Surgical History:  Procedure Laterality Date  . APPENDECTOMY  1945  . biopsies of breast Bilateral 1971  . blood clots  06/2006   lower abdomen  . CARDIAC CATHETERIZATION  08/2001   A-fib/Palpitations Dr. Daneen Schick  . CHOLECYSTECTOMY  01/1983  . DILATION AND CURETTAGE, DIAGNOSTIC / THERAPEUTIC  1952/1977   x2  . FOOT SURGERY  Right 1978   Cyst removed from Right Foot  . [place]  . [place]  . insertion of vena cova filter  2007  . LEG / ANKLE SOFT TISSUE BIOPSY Right    precancerous  . migraine speech impairment  07/1963  . phlebitis  12/2005  . PULMONARY EMBOLISM SURGERY  03/2005  . TONSILLECTOMY     1954     OB History   No obstetric history on file.     Family History  Problem Relation Age of Onset  . Diabetes Mother   . Hypertension Mother   . Glaucoma Mother   . CVA Mother   . CAD Mother   . Hypertension Father   . Heart attack Father   . Kidney failure Father   . Heart attack Brother        2 half brothers  . Hypertension Brother   . CVA Brother   . Glaucoma Other   . Glaucoma Other   . Prostate cancer Maternal Grandfather   . Breast cancer Maternal Aunt   . Colon cancer Maternal Uncle   . Rectal cancer Maternal Uncle     Social History   Tobacco Use  . Smoking status: Never Smoker  . Smokeless tobacco: Never Used  Substance Use Topics  . Alcohol use: No    Comment: very moderate, none  in the last two months  . Drug use: No    Home Medications Prior to Admission medications   Medication Sig Start Date End Date Taking? Authorizing Provider  amLODipine (NORVASC) 2.5 MG tablet Take 1 tablet (2.5 mg total) by mouth daily. Please make overdue appt for future refills. [phone]. 2nd attempt 05/20/18   Belva Crome, MD  CARBATROL 100 MG 12 hr capsule TAKE 2 CAPSULES BY MOUTH 2 TIMES DAILY Patient taking differently: Take 200 mg by mouth 2 (two) times daily.  07/09/18   Pieter Partridge, DO  cephALEXin (KEFLEX) 500 MG capsule Take 1 capsule (500 mg total) by mouth 3 (three) times daily for 7 days. Then return to normal dosing. 03/10/19 03/17/19  Mesner, Corene Cornea, MD  cholecalciferol (VITAMIN D) 1000 units tablet Take 1,000 Units by mouth daily.    [provider]  enoxaparin (LOVENOX) 100 MG/ML injection Inject 100 mg into the skin at  bedtime.     [provider]  EPINEPHrine (EPIPEN 2-PAK) 0.3 mg/0.3 mL IJ SOAJ injection Inject 0.3 mg into the muscle as needed for anaphylaxis.    [provider]  feeding supplement, ENSURE ENLIVE, (ENSURE ENLIVE) LIQD Take 237 mLs by mouth daily at 3 pm. 10/21/18   Swayze, Ava, DO  fluticasone (FLONASE) 50 MCG/ACT nasal spray Place 1 spray into left nostril daily as needed for allergies or rhinitis.  06/27/14   [provider]  folic acid (FOLVITE) 1 MG tablet Take 1 mg by mouth daily.     [provider]  gabapentin (  NEURONTIN) 300 MG capsule Take 1 capsule (300 mg total) by mouth 2 (two) times daily. 02/05/19   Pieter Partridge, DO  HYDROcodone-acetaminophen (NORCO/VICODIN) 5-325 MG tablet Take 1-2 tablets by mouth every 6 (six) hours as needed for moderate pain. 10/21/18   Swayze, Ava, DO  levothyroxine (SYNTHROID, LEVOTHROID) 75 MCG tablet Take 75 mcg by mouth daily before breakfast.     [provider]  loperamide (IMODIUM A-D) 2 MG tablet Take 2 mg by mouth 4 (four) times daily as needed for diarrhea or loose stools.    [provider]  loratadine (CLARITIN) 10 MG tablet Take 10 mg by mouth daily.    [provider]  meclizine (ANTIVERT) 25 MG tablet Take 25 mg by mouth 2 (two) times daily as needed for dizziness or nausea.  02/18/14   [provider]  Multiple Vitamin (MULTIVITAMIN WITH MINERALS) TABS tablet Take 1 tablet by mouth daily.    [provider]  NAMZARIC 28-10 MG CP24 TAKE 1 CAPSULE BY MOUTH EVERY DAY 02/01/19   Pieter Partridge, DO  nitrofurantoin, macrocrystal-monohydrate, (MACROBID) 100 MG capsule Take 1 capsule (100 mg total) by mouth 2 (two) times daily. 12/23/18   Fredia Sorrow, MD  pravastatin (PRAVACHOL) 40 MG tablet Take 40 mg by mouth daily.     [provider]  RABEprazole (ACIPHEX) 20 MG tablet Take 20 mg by mouth daily.     [provider]  ROCKLATAN 0.02-0.005 % SOLN Place 1  drop into both eyes at bedtime. 10/09/18   [provider]  sodium chloride 1 G tablet Take 1 tablet (1 g total) by mouth 2 (two) times daily with a meal. 08/07/13   Donne Hazel, MD    Allergies    Benadryl [diphenhydramine hcl], Eye drops [tetrahydrozoline hcl], Flu virus vaccine, Iohexol, Shrimp [shellfish allergy], Voltaren [diclofenac sodium], Ambien [zolpidem tartrate], Gluten meal, Travatan z [travoprost], Ativan [lorazepam], Hydrocodone, Betadine antibiotic-moisturize [bacitracin-polymyxin b], Biaxin [clarithromycin], Celebrex [celecoxib], Ciprofloxacin hcl, Combigan [brimonidine tartrate-timolol], Coumadin [warfarin sodium], Ivp dye [iodinated diagnostic agents], Levaquin [levofloxacin in d5w], Naprosyn [naproxen], Pataday [olopatadine hcl], Penicillins, Plavix [clopidogrel bisulfate], and Septra [sulfamethoxazole-trimethoprim]  Review of Systems   Review of Systems  Constitutional: Positive for fatigue. Negative for fever.  HENT: Negative for sore throat.   Eyes: Negative for visual disturbance.  Respiratory: Negative for cough and shortness of breath.   Cardiovascular: Negative for chest pain.  Gastrointestinal: Negative for abdominal pain, nausea and vomiting.  Genitourinary: Negative for difficulty urinating and dysuria.  Musculoskeletal: Negative for back pain and neck pain.  Skin: Negative for rash.  Neurological: Positive for speech difficulty and weakness. Negative for syncope, facial asymmetry, numbness and headaches.    Physical Exam Updated Vital Signs BP (!) 135/51 (BP Location: Right Arm)   Pulse 74   Temp 98.6 F (37 C) (Oral)   Resp 16   SpO2 98%   Physical Exam Vitals and nursing note reviewed.  Constitutional:      General: She is not in acute distress.    Appearance: She is well-developed. She is not diaphoretic.  HENT:     Head: Normocephalic and atraumatic.  Eyes:     General: No visual field deficit.    Conjunctiva/sclera: Conjunctivae  normal.  Cardiovascular:     Rate and Rhythm: Normal rate and regular rhythm.  Pulmonary:     Effort: Pulmonary effort is normal. No respiratory distress.     Breath sounds: Normal breath sounds.  Abdominal:  General: There is no distension.     Palpations: Abdomen is soft.     Tenderness: There is no abdominal tenderness. There is no guarding.  Musculoskeletal:        General: No tenderness.     Cervical back: Normal range of motion.  Skin:    General: Skin is warm and dry.     Findings: No erythema or rash.  Neurological:     Mental Status: She is alert.     GCS: GCS eye subscore is 4. GCS verbal subscore is 5. GCS motor subscore is 6.     Cranial Nerves: No dysarthria.     Sensory: Sensation is intact.     Motor: No weakness or pronator drift.     Comments: Oriented to self, "doctor's office", 2020, reports it is June Neurologic exam inconsistent, initially appeared to have left facial droop and mild LUE weakness, however repeated immediately and strength appeared improved on left, past pointing on finger to nose at times but then able to perform normally as she continues. Difficulty following directions for EOM. Tongue appears deviated to left.     ED Results / Procedures / Treatments   Labs (all labs ordered are listed, but only abnormal results are displayed) Labs Reviewed  COMPREHENSIVE METABOLIC PANEL - Abnormal; Notable for the following components:      Result Value   Glucose, Bld 107 (*)    Creatinine, Ser 1.45 (*)    Total Bilirubin 0.2 (*)    GFR calc non Af Amer 32 (*)    GFR calc Af Amer 37 (*)    All other components within normal limits  CBC - Abnormal; Notable for the following components:   WBC 10.9 (*)    RBC 3.48 (*)    Hemoglobin 9.3 (*)    HCT 29.3 (*)    RDW 17.4 (*)    All other components within normal limits  PROTIME-INR - Abnormal; Notable for the following components:   Prothrombin Time 16.0 (*)    INR 1.3 (*)    All other components  within normal limits  URINALYSIS, ROUTINE W REFLEX MICROSCOPIC - Abnormal; Notable for the following components:   APPearance CLOUDY (*)    Hgb urine dipstick SMALL (*)    Protein, ur 30 (*)    Leukocytes,Ua LARGE (*)    WBC, UA >50 (*)    Bacteria, UA FEW (*)    All other components within normal limits  URINE CULTURE    EKG EKG Interpretation  Date/Time:  Tuesday March 09 2019 22:18:49 EST Ventricular Rate:  69 PR Interval:    QRS Duration: 94 QT Interval:  409 QTC Calculation: 439 R Axis:   8 Text Interpretation: Sinus rhythm Short PR interval Abnormal R-wave progression, early transition No significant change since last tracing Confirmed by Merrily Pew [phone]) on 03/09/2019 11:19:10 PM   Radiology CT Head Wo Contrast  Result Date: 03/09/2019 CLINICAL DATA:  Demented patient. Difficulty speaking and gradual loss of fine motor skills. EXAM: CT HEAD WITHOUT CONTRAST TECHNIQUE: Contiguous axial images were obtained from the base of the skull through the vertex without intravenous contrast. COMPARISON:  Brain MRI 12/23/2018.  Head CT scan 12/22/2018. FINDINGS: Brain: No evidence of acute infarction, hemorrhage, hydrocephalus, extra-axial collection or mass lesion/mass effect. Extensive chronic microvascular ischemic change and scattered lacunar infarctions are again seen. Vascular: No hyperdense vessel or unexpected calcification. Skull: Intact.  No focal lesion. Sinuses/Orbits: Negative. Other: None. IMPRESSION: No acute intracranial abnormality. Chronic  microvascular ischemic change. Electronically Signed   By: Inge Rise M.D.   On: 03/09/2019 21:01   MR BRAIN WO CONTRAST  Result Date: 03/10/2019 CLINICAL DATA:  Initial evaluation for acute speech difficulty. EXAM: MRI HEAD WITHOUT CONTRAST TECHNIQUE: Multiplanar, multiecho pulse sequences of the brain and surrounding structures were obtained without intravenous contrast. COMPARISON:  Prior CT from earlier the same day  as well as previous MRI from 12/23/2018. FINDINGS: Brain: Generalized age-related cerebral atrophy. Patchy and confluent T2/FLAIR hyperintensity within the periventricular deep white matter both cerebral hemispheres, most consistent with chronic small vessel ischemic disease, moderate in nature. Superimposed remote lacunar infarct present at the left caudate. Additional few scattered small remote left cerebellar infarcts noted. No abnormal foci of restricted diffusion to suggest acute or subacute ischemia. Gray-white matter differentiation maintained. No encephalomalacia to suggest chronic cortical infarction. No acute intracranial hemorrhage. Multiple scattered subcentimeter foci of susceptibility artifacts seen involving the supratentorial brain, nonspecific, but could be related to underlying hypertension/small vessel disease or possibly cerebral amyloid angiopathy. No mass lesion, midline shift or mass effect. No hydrocephalus. No extra-axial fluid collection. Pituitary gland suprasellar region normal. Midline structures intact. Vascular: Major intracranial vascular flow voids are maintained. Skull and upper cervical spine: Craniocervical junction within normal limits. Bone marrow signal intensity normal. No scalp soft tissue abnormality. Sinuses/Orbits: Patient status post bilateral ocular lens replacement. Paranasal sinuses are largely clear. Small left mastoid effusion, of doubtful significance. Right mastoid air cells clear. Inner ear structures grossly normal. Other: None. IMPRESSION: 1. No acute intracranial infarct or other abnormality. 2. Age-related cerebral atrophy with underlying advanced chronic microvascular ischemic disease, with a few scattered remote infarcts involving the left basal ganglia and left cerebellum. 3. Multiple scattered chronic micro hemorrhages involving the supratentorial brain, nonspecific, but could be related to underlying poorly controlled hypertension or possibly cerebral  amyloid angiopathy. Electronically Signed   By: Jeannine Boga M.D.   On: 03/10/2019 00:10    Procedures Procedures (including critical care time)  Medications Ordered in ED Medications  nitrofurantoin (macrocrystal-monohydrate) (MACROBID) capsule 100 mg (100 mg Oral Given 03/10/19 0138)    ED Course  I have reviewed the triage vital signs and the nursing notes.  Pertinent labs & imaging results that were available during my care of the patient were reviewed by me and considered in my medical decision making (see chart for details).    MDM Rules/Calculators/A&P                      85yo female with history of patient, CKD, CVA, dementia, DVT, hypertension, hyperlipidemia,  presents with concern for progressive worsening of speech, generalized weakness, right sided weakness noted today with difficulty feeding herself.  Vital signs normal. Labs without significant anemia or electrolyte abnormalities. No sign of cardiac arrhythmia. No symptoms to indicate pneumonia, ACS.  CT head WNL.  History with both progression of functional disability and significant worsening today. Not clearly TIA by history, however given history describing focal neurologic findings and inconsistent exam, will order MRI to evaluate for CVA.  UA is positive for UTI, will plan on increasing keflex to 500 BID for treatment dosing given prior cultures and allergies. No signs of sepsis, do not feel admission indicated at this time for UTI. MRI pending at time of transfer of care to Dr. Dayna Barker.    Final Clinical Impression(s) / ED Diagnoses Final diagnoses:  Generalized weakness  Urinary tract infection without hematuria, site unspecified    Rx /  DC Orders ED Discharge Orders         Ordered    cephALEXin (KEFLEX) 500 MG capsule  3 times daily     03/10/19 0133           Gareth Morgan, MD 03/10/19 1400

## 2019-03-09 NOTE — ED Triage Notes (Signed)
Pt arrives via EMS from [facility] living for evaluation of gradual loss of fine motor skills. Advancing dementia, having trouble finding words. No focal neuro deficits noted, VAN negative. bp 160/80, hr 76, sp02 96, rr 20, cbg 140. No fever. Pt awake, alert, appropriate, NAD in triage.

## 2019-03-10 DIAGNOSIS — M6281 Muscle weakness (generalized): Secondary | ICD-10-CM | POA: Diagnosis not present

## 2019-03-10 LAB — URINE CULTURE: Culture: <10000 — AB

## 2019-03-10 MED ORDER — CEPHALEXIN 500 MG PO CAPS: 500.0000 mg | ORAL_CAPSULE | Freq: Three times a day (TID) | ORAL | 0 refills | Status: AC

## 2019-03-10 NOTE — ED Provider Notes (Signed)
1:35 AM Assumed care from Dr. Billy Fischer, please see their note for full history, physical and decision making until this point. In brief this is a 85 y.o. year old female who presented to the ED tonight with Tremors     Here with generalzed weakness, difficulty feeding herself. Pending workup to ensure no UTI/CVA.   UA w/ UTI. On daily keflex, will increase for now and fu w/ PCP for reeeval. D/w patient and daughter at bedside. All questions answered.   Discharge instructions, including strict return precautions for new or worsening symptoms, given. Patient and/or family verbalized understanding and agreement with the plan as described.   Labs, studies and imaging reviewed by myself and considered in medical decision making if ordered. Imaging interpreted by radiology.  Labs Reviewed  URINE CULTURE - Abnormal; Notable for the following components:      Result Value   Culture   (*)    Value: <10,000 COLONIES/mL INSIGNIFICANT GROWTH Performed at [facility], [place] [address] [place], [place] [address]    All other components within normal limits  COMPREHENSIVE METABOLIC PANEL - Abnormal; Notable for the following components:   Glucose, Bld 107 (*)    Creatinine, Ser 1.45 (*)    Total Bilirubin 0.2 (*)    GFR calc non Af Amer 32 (*)    GFR calc Af Amer 37 (*)    All other components within normal limits  CBC - Abnormal; Notable for the following components:   WBC 10.9 (*)    RBC 3.48 (*)    Hemoglobin 9.3 (*)    HCT 29.3 (*)    RDW 17.4 (*)    All other components within normal limits  PROTIME-INR - Abnormal; Notable for the following components:   Prothrombin Time 16.0 (*)    INR 1.3 (*)    All other components within normal limits  URINALYSIS, ROUTINE W REFLEX MICROSCOPIC - Abnormal; Notable for the following components:   APPearance CLOUDY (*)    Hgb urine dipstick SMALL (*)    Protein, ur 30 (*)    Leukocytes,Ua LARGE (*)    WBC, UA >50 (*)    Bacteria, UA FEW (*)     All other components within normal limits    MR BRAIN WO CONTRAST  Final Result    CT Head Wo Contrast  Final Result      No follow-ups on file.    Jovonni Borquez, Corene Cornea, MD 03/11/19 [id number]

## 2019-03-10 NOTE — ED Notes (Signed)
Patient verbalizes understanding of discharge instructions. Opportunity for questioning and answers were provided. Armband removed by staff, pt discharged from ED.  

## 2019-03-23 ENCOUNTER — Telehealth: Payer: Self-pay | Admitting: Neurology

## 2019-03-23 ENCOUNTER — Other Ambulatory Visit: Payer: Self-pay | Admitting: Neurology

## 2019-03-23 NOTE — Telephone Encounter (Signed)
Namzaric 28-10 MG: Patient's daughter is requesting enough refills to last until the patient's virtual visit on 05/20/2019 at 1:30 PM.  [place]

## 2019-03-24 ENCOUNTER — Other Ambulatory Visit: Payer: Self-pay

## 2019-03-24 MED ORDER — NAMZARIC 28-10 MG PO CP24: 1.0000 | ORAL_CAPSULE | Freq: Every day | ORAL | 0 refills | Status: DC

## 2019-03-24 NOTE — Telephone Encounter (Signed)
Sent!

## 2019-03-29 DIAGNOSIS — R2681 Unsteadiness on feet: Secondary | ICD-10-CM | POA: Diagnosis not present

## 2019-03-29 DIAGNOSIS — M6281 Muscle weakness (generalized): Secondary | ICD-10-CM | POA: Diagnosis not present

## 2019-03-29 DIAGNOSIS — M79651 Pain in right thigh: Secondary | ICD-10-CM | POA: Diagnosis not present

## 2019-03-29 DIAGNOSIS — S72411S Displaced unspecified condyle fracture of lower end of right femur, sequela: Secondary | ICD-10-CM | POA: Diagnosis not present

## 2019-03-30 DIAGNOSIS — M79651 Pain in right thigh: Secondary | ICD-10-CM | POA: Diagnosis not present

## 2019-03-30 DIAGNOSIS — R2681 Unsteadiness on feet: Secondary | ICD-10-CM | POA: Diagnosis not present

## 2019-03-30 DIAGNOSIS — S72411S Displaced unspecified condyle fracture of lower end of right femur, sequela: Secondary | ICD-10-CM | POA: Diagnosis not present

## 2019-03-30 DIAGNOSIS — M6281 Muscle weakness (generalized): Secondary | ICD-10-CM | POA: Diagnosis not present

## 2019-03-31 DIAGNOSIS — M79651 Pain in right thigh: Secondary | ICD-10-CM | POA: Diagnosis not present

## 2019-03-31 DIAGNOSIS — R2681 Unsteadiness on feet: Secondary | ICD-10-CM | POA: Diagnosis not present

## 2019-03-31 DIAGNOSIS — M6281 Muscle weakness (generalized): Secondary | ICD-10-CM | POA: Diagnosis not present

## 2019-03-31 DIAGNOSIS — S72411S Displaced unspecified condyle fracture of lower end of right femur, sequela: Secondary | ICD-10-CM | POA: Diagnosis not present

## 2019-04-01 DIAGNOSIS — M79651 Pain in right thigh: Secondary | ICD-10-CM | POA: Diagnosis not present

## 2019-04-01 DIAGNOSIS — R2681 Unsteadiness on feet: Secondary | ICD-10-CM | POA: Diagnosis not present

## 2019-04-01 DIAGNOSIS — M6281 Muscle weakness (generalized): Secondary | ICD-10-CM | POA: Diagnosis not present

## 2019-04-01 DIAGNOSIS — S72411S Displaced unspecified condyle fracture of lower end of right femur, sequela: Secondary | ICD-10-CM | POA: Diagnosis not present

## 2019-04-03 ENCOUNTER — Other Ambulatory Visit: Payer: Self-pay

## 2019-04-03 ENCOUNTER — Emergency Department (HOSPITAL_COMMUNITY): Payer: Medicare Other

## 2019-04-03 ENCOUNTER — Encounter (HOSPITAL_COMMUNITY): Payer: Self-pay | Admitting: Emergency Medicine

## 2019-04-03 ENCOUNTER — Emergency Department (HOSPITAL_COMMUNITY)
Admission: EM | Admit: 2019-04-03 | Discharge: 2019-04-04 | Disposition: A | Discharge status: Home / Self Care | Payer: Medicare Other | Attending: Emergency Medicine | Admitting: Emergency Medicine

## 2019-04-03 DIAGNOSIS — Z8673 Personal history of transient ischemic attack (TIA), and cerebral infarction without residual deficits: Secondary | ICD-10-CM | POA: Insufficient documentation

## 2019-04-03 DIAGNOSIS — R52 Pain, unspecified: Secondary | ICD-10-CM | POA: Diagnosis not present

## 2019-04-03 DIAGNOSIS — E039 Hypothyroidism, unspecified: Secondary | ICD-10-CM | POA: Diagnosis not present

## 2019-04-03 DIAGNOSIS — Z7901 Long term (current) use of anticoagulants: Secondary | ICD-10-CM | POA: Diagnosis not present

## 2019-04-03 DIAGNOSIS — Z79899 Other long term (current) drug therapy: Secondary | ICD-10-CM | POA: Insufficient documentation

## 2019-04-03 DIAGNOSIS — N390 Urinary tract infection, site not specified: Secondary | ICD-10-CM | POA: Diagnosis not present

## 2019-04-03 DIAGNOSIS — N183 Chronic kidney disease, stage 3 unspecified: Secondary | ICD-10-CM | POA: Insufficient documentation

## 2019-04-03 DIAGNOSIS — R0602 Shortness of breath: Secondary | ICD-10-CM | POA: Diagnosis not present

## 2019-04-03 DIAGNOSIS — I129 Hypertensive chronic kidney disease with stage 1 through stage 4 chronic kidney disease, or unspecified chronic kidney disease: Secondary | ICD-10-CM | POA: Insufficient documentation

## 2019-04-03 DIAGNOSIS — R0902 Hypoxemia: Secondary | ICD-10-CM | POA: Diagnosis not present

## 2019-04-03 DIAGNOSIS — F0390 Unspecified dementia without behavioral disturbance: Secondary | ICD-10-CM | POA: Diagnosis not present

## 2019-04-03 DIAGNOSIS — R55 Syncope and collapse: Secondary | ICD-10-CM | POA: Insufficient documentation

## 2019-04-03 DIAGNOSIS — G4489 Other headache syndrome: Secondary | ICD-10-CM | POA: Diagnosis not present

## 2019-04-03 LAB — CBC WITH DIFFERENTIAL/PLATELET
Abs Immature Granulocytes: 0.04 10*3/uL (ref 0.00–0.07)
Basophils Absolute: 0.1 10*3/uL (ref 0.0–0.1)
Basophils Relative: 1 %
Eosinophils Absolute: 0.2 10*3/uL (ref 0.0–0.5)
Eosinophils Relative: 3 %
HCT: 29.9 % — ABNORMAL LOW (ref 36.0–46.0)
Hemoglobin: 9.8 g/dL — ABNORMAL LOW (ref 12.0–15.0)
Immature Granulocytes: 0 %
Lymphocytes Relative: 16 %
Lymphs Abs: 1.5 10*3/uL (ref 0.7–4.0)
MCH: 27.3 pg (ref 26.0–34.0)
MCHC: 32.8 g/dL (ref 30.0–36.0)
MCV: 83.3 fL (ref 80.0–100.0)
Monocytes Absolute: 1.1 10*3/uL — ABNORMAL HIGH (ref 0.1–1.0)
Monocytes Relative: 12 %
Neutro Abs: 6.3 10*3/uL (ref 1.7–7.7)
Neutrophils Relative %: 68 %
Platelets: 174 10*3/uL (ref 150–400)
RBC: 3.59 MIL/uL — ABNORMAL LOW (ref 3.87–5.11)
RDW: 17 % — ABNORMAL HIGH (ref 11.5–15.5)
WBC: 9.2 10*3/uL (ref 4.0–10.5)
nRBC: 0 % (ref 0.0–0.2)

## 2019-04-03 LAB — COMPREHENSIVE METABOLIC PANEL
ALT: 13 U/L (ref 0–44)
AST: 18 U/L (ref 15–41)
Albumin: 3.5 g/dL (ref 3.5–5.0)
Alkaline Phosphatase: 65 U/L (ref 38–126)
Anion gap: 9 (ref 5–15)
BUN: 23 mg/dL (ref 8–23)
CO2: 24 mmol/L (ref 22–32)
Calcium: 9 mg/dL (ref 8.9–10.3)
Chloride: 102 mmol/L (ref 98–111)
Creatinine, Ser: 1.54 mg/dL — ABNORMAL HIGH (ref 0.44–1.00)
GFR calc Af Amer: 35 mL/min — ABNORMAL LOW (ref >60)
GFR calc non Af Amer: 30 mL/min — ABNORMAL LOW (ref >60)
Glucose, Bld: 167 mg/dL — ABNORMAL HIGH (ref 70–99)
Potassium: 5.1 mmol/L (ref 3.5–5.1)
Sodium: 135 mmol/L (ref 135–145)
Total Bilirubin: <0.1 mg/dL — ABNORMAL LOW (ref 0.3–1.2)
Total Protein: 6.5 g/dL (ref 6.5–8.1)

## 2019-04-03 LAB — URINALYSIS, ROUTINE W REFLEX MICROSCOPIC
Bilirubin Urine: NEGATIVE
Glucose, UA: NEGATIVE mg/dL
Ketones, ur: NEGATIVE mg/dL
Nitrite: NEGATIVE
Protein, ur: NEGATIVE mg/dL
Specific Gravity, Urine: 1.008 (ref 1.005–1.030)
WBC, UA: >50 WBC/hpf — ABNORMAL HIGH (ref 0–5)
pH: 7 (ref 5.0–8.0)

## 2019-04-03 LAB — CBG MONITORING, ED: Glucose-Capillary: 168 mg/dL — ABNORMAL HIGH (ref 70–99)

## 2019-04-03 MED ORDER — FOSFOMYCIN TROMETHAMINE 3 G PO PACK
3.0000 g | PACK | Freq: Once | ORAL | Status: AC
  Administered 2019-04-03: 3 g via ORAL
  Filled 2019-04-03: qty 3

## 2019-04-03 MED ORDER — SODIUM CHLORIDE 0.9 % IV SOLN: INTRAVENOUS | Status: DC

## 2019-04-03 NOTE — ED Triage Notes (Signed)
Pt brought to ED by [facility] from [facility] after having a witnessed syncope episode at 6 pm today, pt c/o all day of generalized weakness, HA and SOB for the past 24 hours. CBG 211, BP 138/40, R -20-  SPO 2 98% RA, Temp 98.5, no pain. Pt is AO x 4 NAD noticed.

## 2019-04-03 NOTE — ED Notes (Signed)
Daughter updated about POC 

## 2019-04-03 NOTE — ED Notes (Signed)
CALLED [facility] FOR TRANSPORT TO [facility]--[name]

## 2019-04-03 NOTE — Discharge Instructions (Signed)
As discussed, your evaluation today has been largely reassuring.  But, it is important that you monitor your condition carefully, and do not hesitate to return to the ED if you develop new, or concerning changes in your condition. ? ?Otherwise, please follow-up with your physician for appropriate ongoing care. ? ?

## 2019-04-03 NOTE — ED Provider Notes (Signed)
[facility] EMERGENCY DEPARTMENT Provider Note   CSN: [id number] Arrival date & time: 04/03/19  1928     History Chief Complaint  Patient presents with  . Loss of Consciousness    [name] is a 86 y.o. female.  HPI    Patient with a history of memory loss presents from nursing facility after an episode of syncope. Patient cannot describe any details of the event.  History is provided by the patient in terms of her current condition, denying pain, weakness, lightheadedness, and from EMS report/nursing home staff in regards to the event itself per Per report the patient was in her usual state of health, no reported fever or other illness, she had an episode of witnessed syncope.  Level 5 caveat secondary to memory loss.  Past Medical History:  Diagnosis Date  . A-fib ([place])   . Acid reflux   . Anxiety   . Arthritis   . Chronic anticoagulation 03/07/2013   Lovenox 100 QD  . Chronic kidney disease    Stage III  . CVA (cerebral infarction) 1964  . Dementia [facility])    Dr Brett Fairy  . Diverticulosis   . DVT (deep venous thrombosis) ([place]) 2007   recurrent w PE s/p [place] filter  . GERD (gastroesophageal reflux disease)    w hiatal hernia endoscopy 2003, Dr Penelope Coop  . Glaucoma   . Hypercholesterolemia   . Hypertension   . Hypoglycemia   . Hypothyroidism    goiter  . IBS (irritable bowel syndrome)   . Junctional bradycardia    resolved with discontinuation of sotalol (2014) after 10 yrs  . Memory loss   . Migraine   . Osteoarthritis    in Bilateral knees and back. -Dr Latanya Maudlin  . Osteoporosis   . Panic attacks   . Phlebitis   . Postherpetic trigeminal neuralgia 10/20/2012  . Pulmonary embolism recurrent    IVC filter  . Shingles    zoster 1990 on the back and on the abdomen in 2007,facial zoster 2006  . Trigeminal neuralgia    r face--Dr Dohmeier  . Unspecified cerebral artery occlusion with cerebral infarction   . Vitamin D deficiency      Patient Active Problem List   Diagnosis Date Noted  . Closed fracture of medial condyle of distal femur ([place]) 10/19/2018  . Cognitive impairment 10/31/2014  . TIA (transient ischemic attack) 12/03/2013  . UTI (lower urinary tract infection) 12/03/2013  . SIADH (syndrome of inappropriate ADH production) ([place]) 08/07/2013  . Back pain 08/03/2013  . Closed left arm fracture 08/03/2013  . Essential hypertension 04/07/2013  . PAF (paroxysmal atrial fibrillation) ([place]) 03/07/2013  . Chronic anticoagulation 03/07/2013  . Obesity, unspecified 03/07/2013  . Nausea 03/07/2013  . Hypothyroidism 03/07/2013  . Bradycardia 03/06/2013  . Dermatophytosis of nail 12/30/2012  . 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 Unspecified cerebral artery occlusion with cerebral infarction   . Unspecified deficiency anemia 10/21/2012  . Postherpetic trigeminal neuralgia 10/20/2012    Past Surgical History:  Procedure Laterality Date  . APPENDECTOMY  1945  . biopsies of breast Bilateral 1971  . blood clots  06/2006   lower abdomen  . CARDIAC CATHETERIZATION  08/2001   A-fib/Palpitations Dr. Daneen Schick  . CHOLECYSTECTOMY  01/1983  . DILATION AND CURETTAGE, DIAGNOSTIC / THERAPEUTIC  1952/1977   x2  . FOOT SURGERY Right 1978   Cyst removed from Right Foot  . [place]  . HEMORRHOID SURGERY  1966  . insertion of vena cova filter  2007  . LEG / ANKLE SOFT TISSUE BIOPSY Right    precancerous  . migraine speech impairment  07/1963  . phlebitis  12/2005  . PULMONARY EMBOLISM SURGERY  03/2005  . TONSILLECTOMY     1954     OB History   No obstetric history on file.     Family History  Problem Relation Age of Onset  . Diabetes Mother   . Hypertension Mother   . Glaucoma Mother   . CVA Mother   . CAD Mother   . Hypertension Father   . Heart attack Father   . Kidney failure Father   . Heart attack Brother        2 half brothers  . Hypertension Brother   .  CVA Brother   . Glaucoma Other   . Glaucoma Other   . Prostate cancer Maternal Grandfather   . Breast cancer Maternal Aunt   . Colon cancer Maternal Uncle   . Rectal cancer Maternal Uncle     Social History   Tobacco Use  . Smoking status: Never Smoker  . Smokeless tobacco: Never Used  Substance Use Topics  . Alcohol use: No    Comment: very moderate, none  in the last two months  . Drug use: No    Home Medications Prior to Admission medications   Medication Sig Start Date End Date Taking? Authorizing Provider  amLODipine (NORVASC) 2.5 MG tablet Take 1 tablet (2.5 mg total) by mouth daily. Please make overdue appt for future refills. [phone]. 2nd attempt 05/20/18   Belva Crome, MD  CARBATROL 100 MG 12 hr capsule TAKE 2 CAPSULES BY MOUTH 2 TIMES DAILY Patient taking differently: Take 200 mg by mouth 2 (two) times daily.  07/09/18   Pieter Partridge, DO  cholecalciferol (VITAMIN D) 1000 units tablet Take 1,000 Units by mouth daily.    [provider]  enoxaparin (LOVENOX) 100 MG/ML injection Inject 100 mg into the skin at bedtime.     [provider]  EPINEPHrine (EPIPEN 2-PAK) 0.3 mg/0.3 mL IJ SOAJ injection Inject 0.3 mg into the muscle as needed for anaphylaxis.    [provider]  feeding supplement, ENSURE ENLIVE, (ENSURE ENLIVE) LIQD Take 237 mLs by mouth daily at 3 pm. 10/21/18   Swayze, Ava, DO  fluticasone (FLONASE) 50 MCG/ACT nasal spray Place 1 spray into left nostril daily as needed for allergies or rhinitis.  06/27/14   [provider]  folic acid (FOLVITE) 1 MG tablet Take 1 mg by mouth daily.     [provider]  gabapentin (NEURONTIN) 300 MG capsule Take 1 capsule (300 mg total) by mouth 2 (two) times daily. 02/05/19   Pieter Partridge, DO  HYDROcodone-acetaminophen (NORCO/VICODIN) 5-325 MG tablet Take 1-2 tablets by mouth every 6 (six) hours as needed for moderate pain. 10/21/18   Swayze, Ava, DO  levothyroxine (SYNTHROID,  LEVOTHROID) 75 MCG tablet Take 75 mcg by mouth daily before breakfast.     [provider]  loperamide (IMODIUM A-D) 2 MG tablet Take 2 mg by mouth 4 (four) times daily as needed for diarrhea or loose stools.    [provider]  loratadine (CLARITIN) 10 MG tablet Take 10 mg by mouth daily.    [provider]  meclizine (ANTIVERT) 25 MG tablet Take 25 mg by mouth 2 (two) times daily as needed for dizziness or nausea.  02/18/14   [provider]  Memantine HCl-Donepezil HCl (NAMZARIC) 28-10 MG CP24 Take 1 capsule by mouth daily. 03/24/19   Pieter Partridge, DO  Multiple Vitamin (MULTIVITAMIN WITH MINERALS) TABS tablet Take 1 tablet by mouth daily.    [provider]  nitrofurantoin, macrocrystal-monohydrate, (MACROBID) 100 MG capsule Take 1 capsule (100 mg total) by mouth 2 (two) times daily. 12/23/18   Fredia Sorrow, MD  pravastatin (PRAVACHOL) 40 MG tablet Take 40 mg by mouth daily.     [provider]  RABEprazole (ACIPHEX) 20 MG tablet Take 20 mg by mouth daily.     [provider]  ROCKLATAN 0.02-0.005 % SOLN Place 1 drop into both eyes at bedtime. 10/09/18   [provider]  sodium chloride 1 G tablet Take 1 tablet (1 g total) by mouth 2 (two) times daily with a meal. 08/07/13   Donne Hazel, MD    Allergies    Benadryl [diphenhydramine hcl], Eye drops [tetrahydrozoline hcl], Flu virus vaccine, Iohexol, Shrimp [shellfish allergy], Voltaren [diclofenac sodium], Ambien [zolpidem tartrate], Gluten meal, Travatan z [travoprost], Ativan [lorazepam], Hydrocodone, Betadine antibiotic-moisturize [bacitracin-polymyxin b], Biaxin [clarithromycin], Celebrex [celecoxib], Ciprofloxacin hcl, Combigan [brimonidine tartrate-timolol], Coumadin [warfarin sodium], Ivp dye [iodinated diagnostic agents], Levaquin [levofloxacin in d5w], Naprosyn [naproxen], Pataday [olopatadine hcl], Penicillins, Plavix [clopidogrel bisulfate], and Septra  [sulfamethoxazole-trimethoprim]  Review of Systems   Review of Systems  Unable to perform ROS: Dementia    Physical Exam Updated Vital Signs BP (!) 128/50   Pulse 62   Resp 17   Ht 5\' 5"  (1.651 m)   Wt 90.7 kg   SpO2 97%   BMI 33.27 kg/m   Physical Exam Vitals and nursing note reviewed.  Constitutional:      General: She is not in acute distress.    Appearance: She is well-developed.  HENT:     Head: Normocephalic and atraumatic.  Eyes:     Conjunctiva/sclera: Conjunctivae normal.  Cardiovascular:     Rate and Rhythm: Normal rate and regular rhythm.  Pulmonary:     Effort: Pulmonary effort is normal. No respiratory distress.     Breath sounds: Normal breath sounds. No stridor.  Abdominal:     General: There is no distension.  Skin:    General: Skin is warm and dry.  Neurological:     Mental Status: She is alert.     Cranial Nerves: No cranial nerve deficit.     Motor: Atrophy present.  Psychiatric:        Behavior: Behavior is withdrawn.        Cognition and Memory: Cognition is impaired.     ED Results / Procedures / Treatments   Labs (all labs ordered are listed, but only abnormal results are displayed) Labs Reviewed  COMPREHENSIVE METABOLIC PANEL - Abnormal; Notable for the following components:      Result Value   Glucose, Bld 167 (*)    Creatinine, Ser 1.54 (*)    Total Bilirubin <0.1 (*)    GFR calc non Af Amer 30 (*)    GFR calc Af Amer 35 (*)    All other components within normal limits  CBC WITH DIFFERENTIAL/PLATELET - Abnormal; Notable for the following components:   RBC 3.59 (*)    Hemoglobin 9.8 (*)    HCT 29.9 (*)    RDW 17.0 (*)    Monocytes Absolute 1.1 (*)    All other components within normal limits  URINALYSIS, ROUTINE W REFLEX MICROSCOPIC - Abnormal; Notable for the following components:  APPearance CLOUDY (*)    Hgb urine dipstick SMALL (*)    Leukocytes,Ua LARGE (*)    WBC, UA >50 (*)    Bacteria, UA RARE (*)    All other  components within normal limits  CBG MONITORING, ED - Abnormal; Notable for the following components:   Glucose-Capillary 168 (*)    All other components within normal limits    EKG from EMS rate 78, minor artifact left axis deviation abnormal  EKG None  Radiology DG Chest Port 1 View  Result Date: 04/03/2019 CLINICAL DATA:  Syncope. EXAM: PORTABLE CHEST 1 VIEW COMPARISON:  12/22/2018 FINDINGS: The lung apices are poorly evaluated secondary to overlapping structures. The heart size is stable from prior study. There is no focal infiltrate. No large pleural effusion. No pneumothorax. No acute osseous abnormality. IMPRESSION: No active disease. Electronically Signed   By: Constance Holster M.D.   On: 04/03/2019 20:13    Procedures Procedures (including critical care time)  Medications Ordered in ED Medications  0.9 %  sodium chloride infusion ( Intravenous New Bag/Given 04/03/19 2014)  fosfomycin (MONUROL) packet 3 g (has no administration in time range)    ED Course  I have reviewed the triage vital signs and the nursing notes.  Pertinent labs & imaging results that were available during my care of the patient were reviewed by me and considered in my medical decision making (see chart for details).    MDM Rules/Calculators/A&P                      11:14 PM 11:14 PM Patient in no distress, hemodynamically unremarkable. Labs generally reassuring, with nonischemic, nonarrhythmic EKG, no substantial lab abnormalities. Patient is found to have evidence for lower urinary tract disease which may have contributed to today's reported episode of syncope. Patient has received antibiotics, is appropriate for discharge to her nursing facility, with instructions to follow-up with her physician and/or cardiologist in the coming days for additional evaluation.  Absent other alarming findings, patient is appropriate for discharge. Final Clinical Impression(s) / ED Diagnoses Final diagnoses:    Syncope and collapse  Lower urinary tract infectious disease     Carmin Muskrat, MD 04/03/19 2317

## 2019-04-04 DIAGNOSIS — M2550 Pain in unspecified joint: Secondary | ICD-10-CM | POA: Diagnosis not present

## 2019-04-04 DIAGNOSIS — I959 Hypotension, unspecified: Secondary | ICD-10-CM | POA: Diagnosis not present

## 2019-04-04 DIAGNOSIS — Z7401 Bed confinement status: Secondary | ICD-10-CM | POA: Diagnosis not present

## 2019-04-04 NOTE — ED Notes (Signed)
Pt moved to hallway 20 per CN request to wait for PTAR, clean and changed to get ready for PTAR.

## 2019-04-04 NOTE — ED Notes (Signed)
Report given to SNF.

## 2019-04-05 DIAGNOSIS — R2681 Unsteadiness on feet: Secondary | ICD-10-CM | POA: Diagnosis not present

## 2019-04-05 DIAGNOSIS — M6281 Muscle weakness (generalized): Secondary | ICD-10-CM | POA: Diagnosis not present

## 2019-04-05 DIAGNOSIS — M79651 Pain in right thigh: Secondary | ICD-10-CM | POA: Diagnosis not present

## 2019-04-05 DIAGNOSIS — S72411S Displaced unspecified condyle fracture of lower end of right femur, sequela: Secondary | ICD-10-CM | POA: Diagnosis not present

## 2019-04-06 DIAGNOSIS — S72411S Displaced unspecified condyle fracture of lower end of right femur, sequela: Secondary | ICD-10-CM | POA: Diagnosis not present

## 2019-04-06 DIAGNOSIS — N390 Urinary tract infection, site not specified: Secondary | ICD-10-CM | POA: Diagnosis not present

## 2019-04-06 DIAGNOSIS — R2681 Unsteadiness on feet: Secondary | ICD-10-CM | POA: Diagnosis not present

## 2019-04-06 DIAGNOSIS — M170 Bilateral primary osteoarthritis of knee: Secondary | ICD-10-CM | POA: Diagnosis not present

## 2019-04-06 DIAGNOSIS — R5381 Other malaise: Secondary | ICD-10-CM | POA: Diagnosis not present

## 2019-04-06 DIAGNOSIS — M79651 Pain in right thigh: Secondary | ICD-10-CM | POA: Diagnosis not present

## 2019-04-06 DIAGNOSIS — R32 Unspecified urinary incontinence: Secondary | ICD-10-CM | POA: Diagnosis not present

## 2019-04-06 DIAGNOSIS — M6281 Muscle weakness (generalized): Secondary | ICD-10-CM | POA: Diagnosis not present

## 2019-04-06 DIAGNOSIS — F0390 Unspecified dementia without behavioral disturbance: Secondary | ICD-10-CM | POA: Diagnosis not present

## 2019-04-07 DIAGNOSIS — M6281 Muscle weakness (generalized): Secondary | ICD-10-CM | POA: Diagnosis not present

## 2019-04-07 DIAGNOSIS — S72411S Displaced unspecified condyle fracture of lower end of right femur, sequela: Secondary | ICD-10-CM | POA: Diagnosis not present

## 2019-04-07 DIAGNOSIS — R2681 Unsteadiness on feet: Secondary | ICD-10-CM | POA: Diagnosis not present

## 2019-04-07 DIAGNOSIS — M79651 Pain in right thigh: Secondary | ICD-10-CM | POA: Diagnosis not present

## 2019-04-08 DIAGNOSIS — S72411S Displaced unspecified condyle fracture of lower end of right femur, sequela: Secondary | ICD-10-CM | POA: Diagnosis not present

## 2019-04-08 DIAGNOSIS — R2681 Unsteadiness on feet: Secondary | ICD-10-CM | POA: Diagnosis not present

## 2019-04-08 DIAGNOSIS — M79651 Pain in right thigh: Secondary | ICD-10-CM | POA: Diagnosis not present

## 2019-04-08 DIAGNOSIS — M6281 Muscle weakness (generalized): Secondary | ICD-10-CM | POA: Diagnosis not present

## 2019-04-09 DIAGNOSIS — M79651 Pain in right thigh: Secondary | ICD-10-CM | POA: Diagnosis not present

## 2019-04-09 DIAGNOSIS — S72411S Displaced unspecified condyle fracture of lower end of right femur, sequela: Secondary | ICD-10-CM | POA: Diagnosis not present

## 2019-04-09 DIAGNOSIS — R2681 Unsteadiness on feet: Secondary | ICD-10-CM | POA: Diagnosis not present

## 2019-04-09 DIAGNOSIS — M6281 Muscle weakness (generalized): Secondary | ICD-10-CM | POA: Diagnosis not present

## 2019-04-12 DIAGNOSIS — M6281 Muscle weakness (generalized): Secondary | ICD-10-CM | POA: Diagnosis not present

## 2019-04-12 DIAGNOSIS — Z23 Encounter for immunization: Secondary | ICD-10-CM | POA: Diagnosis not present

## 2019-04-12 DIAGNOSIS — S72411S Displaced unspecified condyle fracture of lower end of right femur, sequela: Secondary | ICD-10-CM | POA: Diagnosis not present

## 2019-04-12 DIAGNOSIS — M79651 Pain in right thigh: Secondary | ICD-10-CM | POA: Diagnosis not present

## 2019-04-12 DIAGNOSIS — R2681 Unsteadiness on feet: Secondary | ICD-10-CM | POA: Diagnosis not present

## 2019-04-13 ENCOUNTER — Other Ambulatory Visit: Payer: Self-pay

## 2019-04-13 ENCOUNTER — Telehealth: Payer: Self-pay | Admitting: Neurology

## 2019-04-13 DIAGNOSIS — R2681 Unsteadiness on feet: Secondary | ICD-10-CM | POA: Diagnosis not present

## 2019-04-13 DIAGNOSIS — S72411S Displaced unspecified condyle fracture of lower end of right femur, sequela: Secondary | ICD-10-CM | POA: Diagnosis not present

## 2019-04-13 DIAGNOSIS — M79651 Pain in right thigh: Secondary | ICD-10-CM | POA: Diagnosis not present

## 2019-04-13 DIAGNOSIS — M6281 Muscle weakness (generalized): Secondary | ICD-10-CM | POA: Diagnosis not present

## 2019-04-13 MED ORDER — NAMZARIC 28-10 MG PO CP24: 1.0000 | ORAL_CAPSULE | Freq: Every day | ORAL | 0 refills | Status: DC

## 2019-04-13 MED ORDER — CARBAMAZEPINE ER 100 MG PO CP12: ORAL_CAPSULE | ORAL | 0 refills | Status: DC

## 2019-04-13 NOTE — Telephone Encounter (Signed)
Patient is needing a refill on the Namzaric to the pharm on file. Also the therapy changed her medication for the Carbatrol -she takes one capsule of this 2xs daily. She is needing a new prescription for correct dosage to same pharm.  Thanks!

## 2019-04-14 DIAGNOSIS — M79651 Pain in right thigh: Secondary | ICD-10-CM | POA: Diagnosis not present

## 2019-04-14 DIAGNOSIS — M6281 Muscle weakness (generalized): Secondary | ICD-10-CM | POA: Diagnosis not present

## 2019-04-14 DIAGNOSIS — S72411S Displaced unspecified condyle fracture of lower end of right femur, sequela: Secondary | ICD-10-CM | POA: Diagnosis not present

## 2019-04-14 DIAGNOSIS — R2681 Unsteadiness on feet: Secondary | ICD-10-CM | POA: Diagnosis not present

## 2019-04-15 DIAGNOSIS — M6281 Muscle weakness (generalized): Secondary | ICD-10-CM | POA: Diagnosis not present

## 2019-04-15 DIAGNOSIS — R2681 Unsteadiness on feet: Secondary | ICD-10-CM | POA: Diagnosis not present

## 2019-04-15 DIAGNOSIS — M79651 Pain in right thigh: Secondary | ICD-10-CM | POA: Diagnosis not present

## 2019-04-15 DIAGNOSIS — S72411S Displaced unspecified condyle fracture of lower end of right femur, sequela: Secondary | ICD-10-CM | POA: Diagnosis not present

## 2019-04-16 DIAGNOSIS — S72411S Displaced unspecified condyle fracture of lower end of right femur, sequela: Secondary | ICD-10-CM | POA: Diagnosis not present

## 2019-04-16 DIAGNOSIS — M6281 Muscle weakness (generalized): Secondary | ICD-10-CM | POA: Diagnosis not present

## 2019-04-16 DIAGNOSIS — R2681 Unsteadiness on feet: Secondary | ICD-10-CM | POA: Diagnosis not present

## 2019-04-16 DIAGNOSIS — M79651 Pain in right thigh: Secondary | ICD-10-CM | POA: Diagnosis not present

## 2019-04-19 DIAGNOSIS — R300 Dysuria: Secondary | ICD-10-CM | POA: Diagnosis not present

## 2019-04-20 DIAGNOSIS — R2681 Unsteadiness on feet: Secondary | ICD-10-CM | POA: Diagnosis not present

## 2019-04-20 DIAGNOSIS — M6281 Muscle weakness (generalized): Secondary | ICD-10-CM | POA: Diagnosis not present

## 2019-04-20 DIAGNOSIS — M79651 Pain in right thigh: Secondary | ICD-10-CM | POA: Diagnosis not present

## 2019-04-20 DIAGNOSIS — S72411S Displaced unspecified condyle fracture of lower end of right femur, sequela: Secondary | ICD-10-CM | POA: Diagnosis not present

## 2019-04-21 DIAGNOSIS — M79651 Pain in right thigh: Secondary | ICD-10-CM | POA: Diagnosis not present

## 2019-04-21 DIAGNOSIS — S72411S Displaced unspecified condyle fracture of lower end of right femur, sequela: Secondary | ICD-10-CM | POA: Diagnosis not present

## 2019-04-21 DIAGNOSIS — M6281 Muscle weakness (generalized): Secondary | ICD-10-CM | POA: Diagnosis not present

## 2019-04-21 DIAGNOSIS — R2681 Unsteadiness on feet: Secondary | ICD-10-CM | POA: Diagnosis not present

## 2019-04-26 ENCOUNTER — Observation Stay (HOSPITAL_COMMUNITY): Payer: Medicare Other

## 2019-04-26 ENCOUNTER — Emergency Department (HOSPITAL_COMMUNITY): Payer: Medicare Other

## 2019-04-26 ENCOUNTER — Inpatient Hospital Stay (HOSPITAL_COMMUNITY)
Admission: EM | Admit: 2019-04-26 | Discharge: 2019-04-28 | DRG: 312 | Disposition: A | Discharge status: Home Health | Payer: Medicare Other | Attending: Student | Admitting: Student

## 2019-04-26 ENCOUNTER — Other Ambulatory Visit: Payer: Self-pay

## 2019-04-26 DIAGNOSIS — E871 Hypo-osmolality and hyponatremia: Secondary | ICD-10-CM | POA: Diagnosis present

## 2019-04-26 DIAGNOSIS — Z7901 Long term (current) use of anticoagulants: Secondary | ICD-10-CM

## 2019-04-26 DIAGNOSIS — Z8249 Family history of ischemic heart disease and other diseases of the circulatory system: Secondary | ICD-10-CM

## 2019-04-26 DIAGNOSIS — R55 Syncope and collapse: Secondary | ICD-10-CM | POA: Diagnosis not present

## 2019-04-26 DIAGNOSIS — R29702 NIHSS score 2: Secondary | ICD-10-CM | POA: Diagnosis present

## 2019-04-26 DIAGNOSIS — Z841 Family history of disorders of kidney and ureter: Secondary | ICD-10-CM

## 2019-04-26 DIAGNOSIS — R5381 Other malaise: Secondary | ICD-10-CM | POA: Diagnosis present

## 2019-04-26 DIAGNOSIS — I129 Hypertensive chronic kidney disease with stage 1 through stage 4 chronic kidney disease, or unspecified chronic kidney disease: Secondary | ICD-10-CM | POA: Diagnosis present

## 2019-04-26 DIAGNOSIS — M810 Age-related osteoporosis without current pathological fracture: Secondary | ICD-10-CM | POA: Diagnosis present

## 2019-04-26 DIAGNOSIS — E785 Hyperlipidemia, unspecified: Secondary | ICD-10-CM | POA: Diagnosis present

## 2019-04-26 DIAGNOSIS — Z87440 Personal history of urinary (tract) infections: Secondary | ICD-10-CM

## 2019-04-26 DIAGNOSIS — Z8672 Personal history of thrombophlebitis: Secondary | ICD-10-CM

## 2019-04-26 DIAGNOSIS — S8991XA Unspecified injury of right lower leg, initial encounter: Secondary | ICD-10-CM | POA: Diagnosis present

## 2019-04-26 DIAGNOSIS — I4891 Unspecified atrial fibrillation: Secondary | ICD-10-CM | POA: Diagnosis not present

## 2019-04-26 DIAGNOSIS — I080 Rheumatic disorders of both mitral and aortic valves: Secondary | ICD-10-CM | POA: Diagnosis present

## 2019-04-26 DIAGNOSIS — E559 Vitamin D deficiency, unspecified: Secondary | ICD-10-CM | POA: Diagnosis present

## 2019-04-26 DIAGNOSIS — E039 Hypothyroidism, unspecified: Secondary | ICD-10-CM | POA: Diagnosis present

## 2019-04-26 DIAGNOSIS — Z880 Allergy status to penicillin: Secondary | ICD-10-CM

## 2019-04-26 DIAGNOSIS — R0602 Shortness of breath: Secondary | ICD-10-CM | POA: Diagnosis not present

## 2019-04-26 DIAGNOSIS — R531 Weakness: Secondary | ICD-10-CM | POA: Diagnosis not present

## 2019-04-26 DIAGNOSIS — R404 Transient alteration of awareness: Secondary | ICD-10-CM | POA: Diagnosis not present

## 2019-04-26 DIAGNOSIS — Z9049 Acquired absence of other specified parts of digestive tract: Secondary | ICD-10-CM

## 2019-04-26 DIAGNOSIS — F0390 Unspecified dementia without behavioral disturbance: Secondary | ICD-10-CM | POA: Diagnosis present

## 2019-04-26 DIAGNOSIS — F419 Anxiety disorder, unspecified: Secondary | ICD-10-CM | POA: Diagnosis present

## 2019-04-26 DIAGNOSIS — Z833 Family history of diabetes mellitus: Secondary | ICD-10-CM

## 2019-04-26 DIAGNOSIS — Z86711 Personal history of pulmonary embolism: Secondary | ICD-10-CM

## 2019-04-26 DIAGNOSIS — Z91013 Allergy to seafood: Secondary | ICD-10-CM

## 2019-04-26 DIAGNOSIS — Z8673 Personal history of transient ischemic attack (TIA), and cerebral infarction without residual deficits: Secondary | ICD-10-CM

## 2019-04-26 DIAGNOSIS — G459 Transient cerebral ischemic attack, unspecified: Secondary | ICD-10-CM

## 2019-04-26 DIAGNOSIS — Z993 Dependence on wheelchair: Secondary | ICD-10-CM

## 2019-04-26 DIAGNOSIS — Z20822 Contact with and (suspected) exposure to covid-19: Secondary | ICD-10-CM | POA: Diagnosis present

## 2019-04-26 DIAGNOSIS — Z823 Family history of stroke: Secondary | ICD-10-CM

## 2019-04-26 DIAGNOSIS — Z83511 Family history of glaucoma: Secondary | ICD-10-CM

## 2019-04-26 DIAGNOSIS — N390 Urinary tract infection, site not specified: Secondary | ICD-10-CM | POA: Diagnosis not present

## 2019-04-26 DIAGNOSIS — Z7401 Bed confinement status: Secondary | ICD-10-CM

## 2019-04-26 DIAGNOSIS — Z800 Family history of malignant neoplasm of digestive organs: Secondary | ICD-10-CM

## 2019-04-26 DIAGNOSIS — Z881 Allergy status to other antibiotic agents status: Secondary | ICD-10-CM

## 2019-04-26 DIAGNOSIS — K219 Gastro-esophageal reflux disease without esophagitis: Secondary | ICD-10-CM | POA: Diagnosis present

## 2019-04-26 DIAGNOSIS — T4275XA Adverse effect of unspecified antiepileptic and sedative-hypnotic drugs, initial encounter: Secondary | ICD-10-CM | POA: Diagnosis present

## 2019-04-26 DIAGNOSIS — I10 Essential (primary) hypertension: Secondary | ICD-10-CM | POA: Diagnosis present

## 2019-04-26 DIAGNOSIS — W19XXXD Unspecified fall, subsequent encounter: Secondary | ICD-10-CM | POA: Diagnosis present

## 2019-04-26 DIAGNOSIS — N1832 Chronic kidney disease, stage 3b: Secondary | ICD-10-CM | POA: Diagnosis present

## 2019-04-26 DIAGNOSIS — S7291XD Unspecified fracture of right femur, subsequent encounter for closed fracture with routine healing: Secondary | ICD-10-CM

## 2019-04-26 DIAGNOSIS — Z803 Family history of malignant neoplasm of breast: Secondary | ICD-10-CM

## 2019-04-26 DIAGNOSIS — I480 Paroxysmal atrial fibrillation: Secondary | ICD-10-CM | POA: Diagnosis present

## 2019-04-26 DIAGNOSIS — G250 Essential tremor: Secondary | ICD-10-CM | POA: Diagnosis present

## 2019-04-26 DIAGNOSIS — F410 Panic disorder [episodic paroxysmal anxiety] without agoraphobia: Secondary | ICD-10-CM | POA: Diagnosis present

## 2019-04-26 DIAGNOSIS — T40605A Adverse effect of unspecified narcotics, initial encounter: Secondary | ICD-10-CM | POA: Diagnosis not present

## 2019-04-26 DIAGNOSIS — R4781 Slurred speech: Secondary | ICD-10-CM | POA: Diagnosis present

## 2019-04-26 DIAGNOSIS — Z86718 Personal history of other venous thrombosis and embolism: Secondary | ICD-10-CM

## 2019-04-26 DIAGNOSIS — H409 Unspecified glaucoma: Secondary | ICD-10-CM | POA: Diagnosis present

## 2019-04-26 DIAGNOSIS — N183 Chronic kidney disease, stage 3 unspecified: Secondary | ICD-10-CM | POA: Diagnosis present

## 2019-04-26 DIAGNOSIS — T421X5A Adverse effect of iminostilbenes, initial encounter: Secondary | ICD-10-CM | POA: Diagnosis present

## 2019-04-26 DIAGNOSIS — Y92009 Unspecified place in unspecified non-institutional (private) residence as the place of occurrence of the external cause: Secondary | ICD-10-CM

## 2019-04-26 DIAGNOSIS — N189 Chronic kidney disease, unspecified: Secondary | ICD-10-CM | POA: Diagnosis present

## 2019-04-26 DIAGNOSIS — Z91018 Allergy to other foods: Secondary | ICD-10-CM

## 2019-04-26 DIAGNOSIS — Z885 Allergy status to narcotic agent status: Secondary | ICD-10-CM

## 2019-04-26 DIAGNOSIS — E7800 Pure hypercholesterolemia, unspecified: Secondary | ICD-10-CM | POA: Diagnosis present

## 2019-04-26 DIAGNOSIS — M170 Bilateral primary osteoarthritis of knee: Secondary | ICD-10-CM | POA: Diagnosis present

## 2019-04-26 DIAGNOSIS — R29810 Facial weakness: Secondary | ICD-10-CM | POA: Diagnosis not present

## 2019-04-26 DIAGNOSIS — D631 Anemia in chronic kidney disease: Secondary | ICD-10-CM | POA: Diagnosis present

## 2019-04-26 DIAGNOSIS — Z91041 Radiographic dye allergy status: Secondary | ICD-10-CM

## 2019-04-26 DIAGNOSIS — Z888 Allergy status to other drugs, medicaments and biological substances status: Secondary | ICD-10-CM

## 2019-04-26 HISTORY — DX: Unspecified injury of unspecified lower leg, initial encounter: S89.90XA

## 2019-04-26 LAB — DIFFERENTIAL
Abs Immature Granulocytes: 0.03 10*3/uL (ref 0.00–0.07)
Basophils Absolute: 0.1 10*3/uL (ref 0.0–0.1)
Basophils Relative: 1 %
Eosinophils Absolute: 0.3 10*3/uL (ref 0.0–0.5)
Eosinophils Relative: 4 %
Immature Granulocytes: 0 %
Lymphocytes Relative: 16 %
Lymphs Abs: 1.1 10*3/uL (ref 0.7–4.0)
Monocytes Absolute: 0.8 10*3/uL (ref 0.1–1.0)
Monocytes Relative: 11 %
Neutro Abs: 4.8 10*3/uL (ref 1.7–7.7)
Neutrophils Relative %: 68 %

## 2019-04-26 LAB — COMPREHENSIVE METABOLIC PANEL
ALT: 14 U/L (ref 0–44)
AST: 18 U/L (ref 15–41)
Albumin: 3.5 g/dL (ref 3.5–5.0)
Alkaline Phosphatase: 64 U/L (ref 38–126)
Anion gap: 13 (ref 5–15)
BUN: 25 mg/dL — ABNORMAL HIGH (ref 8–23)
CO2: 22 mmol/L (ref 22–32)
Calcium: 9.2 mg/dL (ref 8.9–10.3)
Chloride: 94 mmol/L — ABNORMAL LOW (ref 98–111)
Creatinine, Ser: 1.25 mg/dL — ABNORMAL HIGH (ref 0.44–1.00)
GFR calc Af Amer: 45 mL/min — ABNORMAL LOW (ref >60)
GFR calc non Af Amer: 39 mL/min — ABNORMAL LOW (ref >60)
Glucose, Bld: 106 mg/dL — ABNORMAL HIGH (ref 70–99)
Potassium: 4.3 mmol/L (ref 3.5–5.1)
Sodium: 129 mmol/L — ABNORMAL LOW (ref 135–145)
Total Bilirubin: 0.6 mg/dL (ref 0.3–1.2)
Total Protein: 6.3 g/dL — ABNORMAL LOW (ref 6.5–8.1)

## 2019-04-26 LAB — IRON AND TIBC
Iron: 47 ug/dL (ref 28–170)
Saturation Ratios: 21 % (ref 10.4–31.8)
TIBC: 228 ug/dL — ABNORMAL LOW (ref 250–450)
UIBC: 181 ug/dL

## 2019-04-26 LAB — CBC
HCT: 28.6 % — ABNORMAL LOW (ref 36.0–46.0)
Hemoglobin: 9.2 g/dL — ABNORMAL LOW (ref 12.0–15.0)
MCH: 26.7 pg (ref 26.0–34.0)
MCHC: 32.2 g/dL (ref 30.0–36.0)
MCV: 82.9 fL (ref 80.0–100.0)
Platelets: 153 10*3/uL (ref 150–400)
RBC: 3.45 MIL/uL — ABNORMAL LOW (ref 3.87–5.11)
RDW: 16.7 % — ABNORMAL HIGH (ref 11.5–15.5)
WBC: 7.1 10*3/uL (ref 4.0–10.5)
nRBC: 0 % (ref 0.0–0.2)

## 2019-04-26 LAB — APTT: aPTT: 33 seconds (ref 24–36)

## 2019-04-26 LAB — I-STAT CHEM 8, ED
BUN: 24 mg/dL — ABNORMAL HIGH (ref 8–23)
Calcium, Ion: 1.16 mmol/L (ref 1.15–1.40)
Chloride: 94 mmol/L — ABNORMAL LOW (ref 98–111)
Creatinine, Ser: 1.3 mg/dL — ABNORMAL HIGH (ref 0.44–1.00)
Glucose, Bld: 102 mg/dL — ABNORMAL HIGH (ref 70–99)
HCT: 28 % — ABNORMAL LOW (ref 36.0–46.0)
Hemoglobin: 9.5 g/dL — ABNORMAL LOW (ref 12.0–15.0)
Potassium: 4.3 mmol/L (ref 3.5–5.1)
Sodium: 129 mmol/L — ABNORMAL LOW (ref 135–145)
TCO2: 26 mmol/L (ref 22–32)

## 2019-04-26 LAB — URINALYSIS, ROUTINE W REFLEX MICROSCOPIC
Bilirubin Urine: NEGATIVE
Glucose, UA: NEGATIVE mg/dL
Hgb urine dipstick: NEGATIVE
Ketones, ur: NEGATIVE mg/dL
Nitrite: NEGATIVE
Protein, ur: NEGATIVE mg/dL
Specific Gravity, Urine: 1.008 (ref 1.005–1.030)
WBC, UA: >50 WBC/hpf — ABNORMAL HIGH (ref 0–5)
pH: 7 (ref 5.0–8.0)

## 2019-04-26 LAB — FERRITIN: Ferritin: 309 ng/mL — ABNORMAL HIGH (ref 11–307)

## 2019-04-26 LAB — PROTIME-INR
INR: 1.4 — ABNORMAL HIGH (ref 0.8–1.2)
Prothrombin Time: 17 seconds — ABNORMAL HIGH (ref 11.4–15.2)

## 2019-04-26 LAB — RETICULOCYTES
Immature Retic Fract: 4.1 % (ref 2.3–15.9)
RBC.: 3.35 MIL/uL — ABNORMAL LOW (ref 3.87–5.11)
Retic Count, Absolute: 34.5 10*3/uL (ref 19.0–186.0)
Retic Ct Pct: 1 % (ref 0.4–3.1)

## 2019-04-26 LAB — TSH: TSH: 1.345 u[IU]/mL (ref 0.350–4.500)

## 2019-04-26 LAB — TROPONIN I (HIGH SENSITIVITY)
Troponin I (High Sensitivity): 6 ng/L (ref <18)
Troponin I (High Sensitivity): 7 ng/L (ref <18)

## 2019-04-26 LAB — ETHANOL: Alcohol, Ethyl (B): <10 mg/dL (ref <10)

## 2019-04-26 LAB — HEMOGLOBIN A1C
Hgb A1c MFr Bld: 5.6 % (ref 4.8–5.6)
Mean Plasma Glucose: 114.02 mg/dL

## 2019-04-26 LAB — RAPID URINE DRUG SCREEN, HOSP PERFORMED
Amphetamines: NOT DETECTED
Barbiturates: NOT DETECTED
Benzodiazepines: NOT DETECTED
Cocaine: NOT DETECTED
Opiates: POSITIVE — AB
Tetrahydrocannabinol: NOT DETECTED

## 2019-04-26 LAB — BRAIN NATRIURETIC PEPTIDE: B Natriuretic Peptide: 98.6 pg/mL (ref 0.0–100.0)

## 2019-04-26 LAB — SARS CORONAVIRUS 2 (TAT 6-24 HRS): SARS Coronavirus 2: NEGATIVE

## 2019-04-26 MED ORDER — MEMANTINE HCL-DONEPEZIL HCL ER 28-10 MG PO CP24: 1.0000 | ORAL_CAPSULE | Freq: Every day | ORAL | Status: DC

## 2019-04-26 MED ORDER — LORATADINE 10 MG PO TABS
10.0000 mg | ORAL_TABLET | Freq: Every day | ORAL | Status: DC
  Administered 2019-04-26 – 2019-04-28 (×3): 10 mg via ORAL
  Filled 2019-04-26 (×3): qty 1

## 2019-04-26 MED ORDER — PRAVASTATIN SODIUM 40 MG PO TABS
40.0000 mg | ORAL_TABLET | Freq: Every day | ORAL | Status: DC
  Administered 2019-04-26 – 2019-04-27 (×2): 40 mg via ORAL
  Filled 2019-04-26 (×3): qty 1

## 2019-04-26 MED ORDER — GABAPENTIN 300 MG PO CAPS
300.0000 mg | ORAL_CAPSULE | Freq: Two times a day (BID) | ORAL | Status: DC
  Administered 2019-04-26 – 2019-04-28 (×4): 300 mg via ORAL
  Filled 2019-04-26 (×4): qty 1

## 2019-04-26 MED ORDER — CEPHALEXIN 250 MG PO CAPS
250.0000 mg | ORAL_CAPSULE | Freq: Every day | ORAL | Status: DC
  Administered 2019-04-27 – 2019-04-28 (×2): 250 mg via ORAL
  Filled 2019-04-26 (×2): qty 1

## 2019-04-26 MED ORDER — ACETAMINOPHEN-CODEINE #3 300-30 MG PO TABS: 1.0000 | ORAL_TABLET | Freq: Four times a day (QID) | ORAL | Status: DC | PRN

## 2019-04-26 MED ORDER — FOLIC ACID 1 MG PO TABS
1.0000 mg | ORAL_TABLET | Freq: Every day | ORAL | Status: DC
  Administered 2019-04-27 – 2019-04-28 (×2): 1 mg via ORAL
  Filled 2019-04-26 (×2): qty 1

## 2019-04-26 MED ORDER — LEVOTHYROXINE SODIUM 75 MCG PO TABS
75.0000 ug | ORAL_TABLET | Freq: Every day | ORAL | Status: DC
  Administered 2019-04-27 – 2019-04-28 (×2): 75 ug via ORAL
  Filled 2019-04-26 (×2): qty 1

## 2019-04-26 MED ORDER — SODIUM CHLORIDE 0.9% FLUSH
3.0000 mL | Freq: Two times a day (BID) | INTRAVENOUS | Status: DC
  Administered 2019-04-26 – 2019-04-27 (×3): 3 mL via INTRAVENOUS

## 2019-04-26 MED ORDER — LOPERAMIDE HCL 2 MG PO CAPS: 2.0000 mg | ORAL_CAPSULE | Freq: Four times a day (QID) | ORAL | Status: DC | PRN

## 2019-04-26 MED ORDER — MEMANTINE HCL ER 28 MG PO CP24
28.0000 mg | ORAL_CAPSULE | Freq: Every day | ORAL | Status: DC
  Administered 2019-04-27 – 2019-04-28 (×2): 28 mg via ORAL
  Filled 2019-04-26 (×2): qty 1

## 2019-04-26 MED ORDER — ADULT MULTIVITAMIN W/MINERALS CH
1.0000 | ORAL_TABLET | Freq: Every day | ORAL | Status: DC
  Administered 2019-04-27 – 2019-04-28 (×2): 1 via ORAL
  Filled 2019-04-26 (×2): qty 1

## 2019-04-26 MED ORDER — CARBAMAZEPINE ER 100 MG PO TB12
100.0000 mg | ORAL_TABLET | Freq: Two times a day (BID) | ORAL | Status: DC
  Administered 2019-04-26 – 2019-04-28 (×4): 100 mg via ORAL
  Filled 2019-04-26 (×4): qty 1

## 2019-04-26 MED ORDER — NETARSUDIL-LATANOPROST 0.02-0.005 % OP SOLN
1.0000 [drp] | Freq: Every day | OPHTHALMIC | Status: DC
  Administered 2019-04-27: 1 [drp] via OPHTHALMIC

## 2019-04-26 MED ORDER — DONEPEZIL HCL 10 MG PO TABS
10.0000 mg | ORAL_TABLET | Freq: Every day | ORAL | Status: DC
  Administered 2019-04-26 – 2019-04-27 (×2): 10 mg via ORAL
  Filled 2019-04-26 (×2): qty 1

## 2019-04-26 MED ORDER — VITAMIN D 25 MCG (1000 UNIT) PO TABS
2000.0000 [IU] | ORAL_TABLET | Freq: Every day | ORAL | Status: DC
  Administered 2019-04-27 – 2019-04-28 (×2): 2000 [IU] via ORAL
  Filled 2019-04-26 (×2): qty 2

## 2019-04-26 MED ORDER — FLUTICASONE PROPIONATE 50 MCG/ACT NA SUSP
1.0000 | Freq: Every day | NASAL | Status: DC
  Filled 2019-04-26: qty 16

## 2019-04-26 MED ORDER — APIXABAN 5 MG PO TABS
5.0000 mg | ORAL_TABLET | Freq: Two times a day (BID) | ORAL | Status: DC
  Administered 2019-04-26 – 2019-04-28 (×4): 5 mg via ORAL
  Filled 2019-04-26 (×5): qty 1

## 2019-04-26 MED ORDER — STROKE: EARLY STAGES OF RECOVERY BOOK: Freq: Once | Status: DC

## 2019-04-26 NOTE — H&P (Signed)
History and Physical    [name] [id number] DOB: [birth year] DOA: 04/26/2019  PCP: [name], MD   Patient coming from: Home  I have personally briefly reviewed patient's old medical records in [place]  Chief Complaint: I was slow and weak.  HPI: [name] is a 86 y.o. female with medical history significant of trigeminal neuralgia, migraine, memory loss, junctional bradycardia, hypertension, high cholesterolemia, dementia, history of CVA back in 1964, atrial fibrillation on Eliquis presented with an episode of TIA vs Syncope at home.  Daughter via phone reported tha patient has a home aid help her exercise since she had a right knee fracture and not operatable since last year.  She is doing well over the last couple days.  However today while being transferred from bed to a chair, suddenly her head rolled to the right side with her tongue hanging out toward right side and her labial groove flattened on right side , eyes open but no response to verbal stimuli.  Whole episode lasted about 3 to 5 minutes-per daughter patient slowly came back to normal but still had significant slurred speech.  Pt reported that her mind was "paused at that time" Denied any significant knee pain, no blurry vision or palpitation, no feeling of N/V, no feeling of warm or cold.   ED Course: Speech still slurred, Neurology informed. CT head negative for acute finding.  Review of Systems: As per HPI otherwise 10 point review of systems negative.    Past Medical History:  Diagnosis Date  . A-fib ([place])   . Acid reflux   . Anxiety   . Arthritis   . Chronic anticoagulation 03/07/2013   Lovenox 100 QD  . Chronic kidney disease    Stage III  . CVA (cerebral infarction) 1964  . Dementia [facility])    Dr Brett Fairy  . Diverticulosis   . DVT (deep venous thrombosis) ([place]) 2007   recurrent w PE s/p [place] filter  . GERD (gastroesophageal reflux disease)    w hiatal hernia endoscopy 2003,  Dr Penelope Coop  . Glaucoma   . Hypercholesterolemia   . Hypertension   . Hypoglycemia   . Hypothyroidism    goiter  . IBS (irritable bowel syndrome)   . Junctional bradycardia    resolved with discontinuation of sotalol (2014) after 10 yrs  . Memory loss   . Migraine   . Osteoarthritis    in Bilateral knees and back. -Dr Latanya Maudlin  . Osteoporosis   . Panic attacks   . Phlebitis   . Postherpetic trigeminal neuralgia 10/20/2012  . Pulmonary embolism recurrent    IVC filter  . Shingles    zoster 1990 on the back and on the abdomen in 2007,facial zoster 2006  . Trigeminal neuralgia    r face--Dr Dohmeier  . Unspecified cerebral artery occlusion with cerebral infarction   . Vitamin D deficiency     Past Surgical History:  Procedure Laterality Date  . APPENDECTOMY  1945  . biopsies of breast Bilateral 1971  . blood clots  06/2006   lower abdomen  . CARDIAC CATHETERIZATION  08/2001   A-fib/Palpitations Dr. Daneen Schick  . CHOLECYSTECTOMY  01/1983  . DILATION AND CURETTAGE, DIAGNOSTIC / THERAPEUTIC  1952/1977   x2  . FOOT SURGERY Right 1978   Cyst removed from Right Foot  . [place]  . [place]  . insertion of vena cova filter  2007  . LEG / ANKLE SOFT TISSUE  BIOPSY Right    precancerous  . migraine speech impairment  07/1963  . phlebitis  12/2005  . PULMONARY EMBOLISM SURGERY  03/2005  . TONSILLECTOMY     1954     reports that she has never smoked. She has never used smokeless tobacco. She reports that she does not drink alcohol or use drugs.  Allergies  Allergen Reactions  . Benadryl [Diphenhydramine Hcl] Hives and Shortness Of Breath  . Eye Drops [Tetrahydrozoline Hcl] Other (See Comments)    Burning, itching, swelling  . Flu Virus Vaccine Shortness Of Breath  . Iohexol Hives and Shortness Of Breath     Code: HIVES, Desc: PT IS ALLERGIC TO IVP DYE/IODINE/SHRIMP.  CAUSES SOB AND HIVES PER PT.  STEPHANIE, RT-R, Onset Date: [id number]   .  Shrimp [Shellfish Allergy] Anaphylaxis  . Voltaren [Diclofenac Sodium] Other (See Comments)    Acid reflux symptoms  . Ambien [Zolpidem Tartrate] Hives  . Gluten Meal Diarrhea  . Travatan Z [Travoprost] Other (See Comments)    BRAND NAME ONLY OLCANNOT TOLERATE GENERIC FORM OF EYE DROPS, IT CONTAINS PRESERVATIVES-SENSITIVITY  . Ativan [Lorazepam]     delusional  . Hydrocodone     Only on the higher dosages  . Betadine Antibiotic-Moisturize [Bacitracin-Polymyxin B] Rash  . Biaxin [Clarithromycin] Hives  . Celebrex [Celecoxib] Rash  . Ciprofloxacin Hcl Rash       . Combigan [Brimonidine Tartrate-Timolol] Other (See Comments)    unknown  . Coumadin [Warfarin Sodium] Rash  . Ivp Dye [Iodinated Diagnostic Agents] Hives  . Levaquin [Levofloxacin In D5w] Rash  . Naprosyn [Naproxen] Rash  . Pataday [Olopatadine Hcl] Other (See Comments)  . Penicillins Hives    Has patient had a PCN reaction causing immediate rash, facial/tongue/throat swelling, SOB or lightheadedness with hypotension: Yes Has patient had a PCN reaction causing severe rash involving mucus membranes or skin necrosis: No Has patient had a PCN reaction that required hospitalization No Has patient had a PCN reaction occurring within the last 10 years: No If all of the above answers are "NO", then may proceed with Cephalosporin use.   Marland Kitchen Plavix [Clopidogrel Bisulfate] Rash  . Septra [Sulfamethoxazole-Trimethoprim] Hives    Family History  Problem Relation Age of Onset  . Diabetes Mother   . Hypertension Mother   . Glaucoma Mother   . CVA Mother   . CAD Mother   . Hypertension Father   . Heart attack Father   . Kidney failure Father   . Heart attack Brother        2 half brothers  . Hypertension Brother   . CVA Brother   . Glaucoma Other   . Glaucoma Other   . Prostate cancer Maternal Grandfather   . Breast cancer Maternal Aunt   . Colon cancer Maternal Uncle   . Rectal cancer Maternal Uncle      Prior to  Admission medications   Medication Sig Start Date End Date Taking? Authorizing Provider  acetaminophen-codeine (TYLENOL #3) 300-30 MG tablet Take 1 tablet by mouth every 6 (six) hours as needed for moderate pain.  03/02/19  Yes [provider]  carbamazepine (CARBATROL) 100 MG 12 hr capsule TAKE 2 CAPSULES BY MOUTH 2 TIMES DAILY Patient taking differently: Take 100 mg by mouth 2 (two) times daily.  04/13/19  Yes Jaffe, Adam R, DO  cephALEXin (KEFLEX) 250 MG capsule Take 250 mg by mouth daily. 04/24/19  Yes [provider]  cholecalciferol (VITAMIN D) 1000 units tablet Take 2,000 Units  by mouth daily.    Yes [provider]  ELIQUIS 5 MG TABS tablet Take 5 mg by mouth 2 (two) times daily. 03/31/19  Yes [provider]  EPINEPHrine (EPIPEN 2-PAK) 0.3 mg/0.3 mL IJ SOAJ injection Inject 0.3 mg into the muscle as needed for anaphylaxis.   Yes [provider]  fluticasone (FLONASE) 50 MCG/ACT nasal spray Place 1 spray into left nostril daily as needed for allergies or rhinitis.  06/27/14  Yes [provider]  folic acid (FOLVITE) 1 MG tablet Take 1 mg by mouth daily.    Yes [provider]  fosfomycin (MONUROL) 3 g PACK Take 3 g by mouth as directed. Mix with 4 ounces of water 04/19/19  Yes [provider]  gabapentin (NEURONTIN) 300 MG capsule Take 1 capsule (300 mg total) by mouth 2 (two) times daily. 02/05/19  Yes Jaffe, Adam R, DO  levothyroxine (SYNTHROID, LEVOTHROID) 75 MCG tablet Take 75 mcg by mouth daily before breakfast.    Yes [provider]  loperamide (IMODIUM A-D) 2 MG tablet Take 2 mg by mouth 4 (four) times daily as needed for diarrhea or loose stools.   Yes [provider]  loratadine (CLARITIN) 10 MG tablet Take 10 mg by mouth daily.   Yes [provider]  meclizine (ANTIVERT) 25 MG tablet Take 25 mg by mouth 2 (two) times daily as needed for dizziness or nausea.  02/18/14  Yes [provider]  Memantine HCl-Donepezil HCl (NAMZARIC) 28-10 MG CP24 Take 1 capsule by mouth daily. Patient taking differently: Take 1 capsule by mouth at bedtime.  04/13/19  Yes Jaffe, Adam R, DO  Multiple Vitamin (MULTIVITAMIN WITH MINERALS) TABS tablet Take 1 tablet by mouth daily.   Yes [provider]  pravastatin (PRAVACHOL) 40 MG tablet Take 40 mg by mouth at bedtime.    Yes [provider]  ROCKLATAN 0.02-0.005 % SOLN Place 1 drop into both eyes at bedtime. 10/09/18  Yes [provider]  amLODipine (NORVASC) 2.5 MG tablet Take 1 tablet (2.5 mg total) by mouth daily. Please make overdue appt for future refills. [phone]. 2nd attempt Patient not taking: Reported on 04/26/2019 05/20/18   Belva Crome, MD  feeding supplement, ENSURE ENLIVE, (ENSURE ENLIVE) LIQD Take 237 mLs by mouth daily at 3 pm. Patient not taking: Reported on 04/26/2019 10/21/18   Swayze, Ava, DO  HYDROcodone-acetaminophen (NORCO/VICODIN) 5-325 MG tablet Take 1-2 tablets by mouth every 6 (six) hours as needed for moderate pain. Patient not taking: Reported on 04/26/2019 10/21/18   Swayze, Ava, DO  sodium chloride 1 G tablet Take 1 tablet (1 g total) by mouth 2 (two) times daily with a meal. Patient not taking: Reported on 04/26/2019 08/07/13   Donne Hazel, MD    Physical Exam: Vitals:   04/26/19 1430 04/26/19 1500 04/26/19 1530 04/26/19 1600  BP: (!) 150/61 (!) 131/50 (!) 128/52 (!) 144/78  Pulse: (!) 58 64 64   Resp: 11 10 12 14   Temp:      TempSrc:      SpO2: 95% 96% 95%     Constitutional: NAD, calm, comfortable Vitals:   04/26/19 1430 04/26/19 1500 04/26/19 1530 04/26/19 1600  BP: (!) 150/61 (!) 131/50 (!) 128/52 (!) 144/78  Pulse: (!) 58 64 64   Resp: 11 10 12 14   Temp:      TempSrc:      SpO2: 95% 96% 95%    Eyes: PERRL, lids and conjunctivae normal  ENMT: Mucous membranes are moist. Posterior pharynx clear of any exudate or lesions.Normal dentition.  Neck: normal, supple, no  masses, no thyromegaly Respiratory: clear to auscultation bilaterally, no wheezing, no crackles. Normal respiratory effort. No accessory muscle use.  Cardiovascular: Regular rate and rhythm, no murmurs / rubs / gallops. No extremity edema. 2+ pedal pulses. No carotid bruits.  Abdomen: no tenderness, no masses palpated. No hepatosplenomegaly. Bowel sounds positive.  Musculoskeletal: no clubbing / cyanosis. No joint deformity upper and lower extremities. Good ROM, no contractures. Normal muscle tone.  Skin: no rashes, lesions, ulcers. No induration Neurologic: CN 2-12 grossly intact. Sensation intact, DTR normal. Strength 5/5 in all 4.  Psychiatric: Normal judgment and insight. Alert and oriented x 3. Normal mood.    Labs on Admission: I have personally reviewed following labs and imaging studies  CBC: Recent Labs  Lab 04/26/19 1258 04/26/19 1305  WBC 7.1  --   NEUTROABS 4.8  --   HGB 9.2* 9.5*  HCT 28.6* 28.0*  MCV 82.9  --   PLT 153  --    Basic Metabolic Panel: Recent Labs  Lab 04/26/19 1258 04/26/19 1305  NA 129* 129*  K 4.3 4.3  CL 94* 94*  CO2 22  --   GLUCOSE 106* 102*  BUN 25* 24*  CREATININE 1.25* 1.30*  CALCIUM 9.2  --    GFR: CrCl cannot be calculated (Unknown ideal weight.). Liver Function Tests: Recent Labs  Lab 04/26/19 1258  AST 18  ALT 14  ALKPHOS 64  BILITOT 0.6  PROT 6.3*  ALBUMIN 3.5   No results for input(s): LIPASE, AMYLASE in the last 168 hours. No results for input(s): AMMONIA in the last 168 hours. Coagulation Profile: Recent Labs  Lab 04/26/19 1258  INR 1.4*   Cardiac Enzymes: No results for input(s): CKTOTAL, CKMB, CKMBINDEX, TROPONINI in the last 168 hours. BNP (last 3 results) No results for input(s): PROBNP in the last 8760 hours. HbA1C: No results for input(s): HGBA1C in the last 72 hours. CBG: No results for input(s): GLUCAP in the last 168 hours. Lipid Profile: No results for input(s): CHOL, HDL, LDLCALC, TRIG, CHOLHDL,  LDLDIRECT in the last 72 hours. Thyroid Function Tests: No results for input(s): TSH, T4TOTAL, FREET4, T3FREE, THYROIDAB in the last 72 hours. Anemia Panel: No results for input(s): VITAMINB12, FOLATE, FERRITIN, TIBC, IRON, RETICCTPCT in the last 72 hours. Urine analysis:    Component Value Date/Time   COLORURINE YELLOW 04/03/2019 2222   APPEARANCEUR CLOUDY (A) 04/03/2019 2222   LABSPEC 1.008 04/03/2019 2222   PHURINE 7.0 04/03/2019 2222   GLUCOSEU NEGATIVE 04/03/2019 2222   HGBUR SMALL (A) 04/03/2019 2222   BILIRUBINUR NEGATIVE 04/03/2019 2222   KETONESUR NEGATIVE 04/03/2019 2222   PROTEINUR NEGATIVE 04/03/2019 2222   UROBILINOGEN 0.2 12/03/2013 1024   NITRITE NEGATIVE 04/03/2019 2222   LEUKOCYTESUR LARGE (A) 04/03/2019 2222    Radiological Exams on Admission: CT HEAD WO CONTRAST  Result Date: 04/26/2019 CLINICAL DATA:  Episode of right-sided weakness and facial droop EXAM: CT HEAD WITHOUT CONTRAST TECHNIQUE: Contiguous axial images were obtained from the base of the skull through the vertex without intravenous contrast. COMPARISON:  03/09/2019 FINDINGS: Brain: There is no acute intracranial hemorrhage, mass-effect, or edema. There is no new loss of gray-white differentiation. Small chronic infarcts are identified within the left cerebellum and left basal ganglia. Patchy and confluent areas of hypoattenuation in the supratentorial white matter are nonspecific but probably reflect stable moderate chronic microvascular ischemic changes. There is no  extra-axial fluid collection. Ventricles and sulci are stable in size and configuration. Vascular: There is atherosclerotic calcification at the skull base. Skull: Calvarium is unremarkable. Sinuses/Orbits: No acute finding. Other: None. IMPRESSION: No acute intracranial abnormality. Stable chronic findings detailed above. Electronically Signed   By: Macy Mis M.D.   On: 04/26/2019 14:12   DG Chest Port 1 View  Result Date:  04/26/2019 CLINICAL DATA:  Right-sided weakness, right-sided facial droop, slurred speech, atrial fibrillation EXAM: PORTABLE CHEST 1 VIEW COMPARISON:  04/03/2019 FINDINGS: The heart size and mediastinal contours are within normal limits. Both lungs are clear. The visualized skeletal structures are unremarkable. IMPRESSION: No active disease. Electronically Signed   By: Randa Ngo M.D.   On: 04/26/2019 13:10    EKG: Independently reviewed. NSR, no PR or QTC changes  Assessment/Plan Active Problems:   TIA (transient ischemic attack)  TIA vs syncope Probably more toward TIA, but transient mental status change somewhat hard to explain. Pt's past Hx showed that she had several episodes of similar transient mentation decrease this year.Note that pt had a positive MRI brain showing punctuate Right parietal infart on 12/23/18 and MRI in Dec 2020 showed multiple old a few scattered remote infarcts involving the left basal ganglia and left cerebellum; Multiple scattered chronic micro hemorrhages involving the supratentorial brain, nonspecific, but could be related to underlying poorly controlled hypertension or possibly cerebral amyloid angiopathy.Her age bracket does put her in risk of Amyloidosis related to CVA. MRI MRA, Echo, Carotid Doppler as per Neuro.  Neuro recommend continue Eliquis for now. Allow permissive HTN Update pt's daughter, all questions answered at my best knowledge.  Hx of UTI On daily ABX suppression.  Chronic right knee fracture Denied acute pain. PT evaluation.  Chronic anemia Send Iron study, no indication for acute intervention.  A-fib EKG shows NSR Continue Eliquis.    DVT prophylaxis: Eliquis Code Status: Pt desires Full Code Family Communication: Daughter over phone Disposition Plan: Likely can go home tomorrow once TIA workup done, pt has good family support. Consults called: Neurology Admission status: Tele Obs  Lequita Halt MD [facility] Pager [phone]  If 7PM-7AM, please contact night-coverage [email or web address] Password [facility]  04/26/2019, 5:08 PM

## 2019-04-26 NOTE — ED Triage Notes (Signed)
Pt here from home where she lives with her daughter, was transitioning from bed to wheelchair. In that process, pt had R weakness, R facial droop, and slurred speech. Episode lasted 15 mins and then totally resolved. LSN 1110. Hx TIA.

## 2019-04-26 NOTE — ED Provider Notes (Signed)
[facility] EMERGENCY DEPARTMENT Provider Note   CSN: [id number] Arrival date & time:        History No chief complaint on file. CC TIA  [name] is a 86 y.o. female.  Level 5 caveat secondary to memory loss.  86 year old female history of A. fib CKD and right knee fracture that was treated nonoperatively.  At baseline she is unable to ambulate and staff and family help transition her.  She is on subcu Lovenox.  She tells me that today she was getting from the bed into her wheelchair to go to her doctor's appointment.  She says it caused to be very winded and she felt like she might pass out.  EMS states that staff noticed that she had right arm weakness right facial droop and slurred speech.  Reportedly lasted 15 minutes.  Patient does not recall that happening.  Denies any blurry vision double vision chest pain abdominal pain vomiting diarrhea she feels that she is back to baseline.  The history is provided by the patient and the EMS personnel.  Cerebrovascular Accident This is a new problem. The current episode started 1 to 2 hours ago. The problem has been resolved. Associated symptoms include shortness of breath. Pertinent negatives include no chest pain, no abdominal pain and no headaches. Nothing aggravates the symptoms. Nothing relieves the symptoms. She has tried nothing for the symptoms. The treatment provided significant relief.  Shortness of Breath Severity:  Moderate Onset quality:  Gradual Timing:  Intermittent Progression:  Partially resolved Chronicity:  New Context: activity   Relieved by:  Rest Worsened by:  Activity Ineffective treatments:  None tried Associated symptoms: no abdominal pain, no chest pain, no cough, no fever, no headaches, no neck pain, no rash, no sore throat, no sputum production and no vomiting        Past Medical History:  Diagnosis Date  . A-fib ([place])   . Acid reflux   . Anxiety   . Arthritis   . Chronic  anticoagulation 03/07/2013   Lovenox 100 QD  . Chronic kidney disease    Stage III  . CVA (cerebral infarction) 1964  . Dementia [facility])    Dr Brett Fairy  . Diverticulosis   . DVT (deep venous thrombosis) ([place]) 2007   recurrent w PE s/p [place] filter  . GERD (gastroesophageal reflux disease)    w hiatal hernia endoscopy 2003, Dr Penelope Coop  . Glaucoma   . Hypercholesterolemia   . Hypertension   . Hypoglycemia   . Hypothyroidism    goiter  . IBS (irritable bowel syndrome)   . Junctional bradycardia    resolved with discontinuation of sotalol (2014) after 10 yrs  . Memory loss   . Migraine   . Osteoarthritis    in Bilateral knees and back. -Dr Latanya Maudlin  . Osteoporosis   . Panic attacks   . Phlebitis   . Postherpetic trigeminal neuralgia 10/20/2012  . Pulmonary embolism recurrent    IVC filter  . Shingles    zoster 1990 on the back and on the abdomen in 2007,facial zoster 2006  . Trigeminal neuralgia    r face--Dr Dohmeier  . Unspecified cerebral artery occlusion with cerebral infarction   . Vitamin D deficiency     Patient Active Problem List   Diagnosis Date Noted  . Closed fracture of medial condyle of distal femur ([place]) 10/19/2018  . Cognitive impairment 10/31/2014  . TIA (transient ischemic attack) 12/03/2013  . UTI (lower urinary tract infection)  12/03/2013  . SIADH (syndrome of inappropriate ADH production) ([place]) 08/07/2013  . Back pain 08/03/2013  . Closed left arm fracture 08/03/2013  . Essential hypertension 04/07/2013  . PAF (paroxysmal atrial fibrillation) ([place]) 03/07/2013  . Chronic anticoagulation 03/07/2013  . Obesity, unspecified 03/07/2013  . Nausea 03/07/2013  . Hypothyroidism 03/07/2013  . Bradycardia 03/06/2013  . Dermatophytosis of nail 12/30/2012  . 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 Unspecified cerebral artery occlusion with cerebral infarction   . Unspecified deficiency anemia 10/21/2012  . Postherpetic trigeminal  neuralgia 10/20/2012    Past Surgical History:  Procedure Laterality Date  . APPENDECTOMY  1945  . biopsies of breast Bilateral 1971  . blood clots  06/2006   lower abdomen  . CARDIAC CATHETERIZATION  08/2001   A-fib/Palpitations Dr. Daneen Schick  . CHOLECYSTECTOMY  01/1983  . DILATION AND CURETTAGE, DIAGNOSTIC / THERAPEUTIC  1952/1977   x2  . FOOT SURGERY Right 1978   Cyst removed from Right Foot  . [place]  . [place]  . insertion of vena cova filter  2007  . LEG / ANKLE SOFT TISSUE BIOPSY Right    precancerous  . migraine speech impairment  07/1963  . phlebitis  12/2005  . PULMONARY EMBOLISM SURGERY  03/2005  . TONSILLECTOMY     1954     OB History   No obstetric history on file.     Family History  Problem Relation Age of Onset  . Diabetes Mother   . Hypertension Mother   . Glaucoma Mother   . CVA Mother   . CAD Mother   . Hypertension Father   . Heart attack Father   . Kidney failure Father   . Heart attack Brother        2 half brothers  . Hypertension Brother   . CVA Brother   . Glaucoma Other   . Glaucoma Other   . Prostate cancer Maternal Grandfather   . Breast cancer Maternal Aunt   . Colon cancer Maternal Uncle   . Rectal cancer Maternal Uncle     Social History   Tobacco Use  . Smoking status: Never Smoker  . Smokeless tobacco: Never Used  Substance Use Topics  . Alcohol use: No    Comment: very moderate, none  in the last two months  . Drug use: No    Home Medications Prior to Admission medications   Medication Sig Start Date End Date Taking? Authorizing Provider  amLODipine (NORVASC) 2.5 MG tablet Take 1 tablet (2.5 mg total) by mouth daily. Please make overdue appt for future refills. [phone]. 2nd attempt 05/20/18   Belva Crome, MD  carbamazepine (CARBATROL) 100 MG 12 hr capsule TAKE 2 CAPSULES BY MOUTH 2 TIMES DAILY 04/13/19   Pieter Partridge, DO  cholecalciferol (VITAMIN D) 1000 units tablet Take  1,000 Units by mouth daily.    [provider]  enoxaparin (LOVENOX) 100 MG/ML injection Inject 100 mg into the skin at bedtime.     [provider]  EPINEPHrine (EPIPEN 2-PAK) 0.3 mg/0.3 mL IJ SOAJ injection Inject 0.3 mg into the muscle as needed for anaphylaxis.    [provider]  feeding supplement, ENSURE ENLIVE, (ENSURE ENLIVE) LIQD Take 237 mLs by mouth daily at 3 pm. 10/21/18   Swayze, Ava, DO  fluticasone (FLONASE) 50 MCG/ACT nasal spray Place 1 spray into left nostril daily as needed for allergies or rhinitis.  06/27/14   [provider]  folic acid (FOLVITE) 1 MG tablet Take 1 mg by mouth daily.     [provider]  gabapentin (NEURONTIN) 300 MG capsule Take 1 capsule (300 mg total) by mouth 2 (two) times daily. 02/05/19   Pieter Partridge, DO  HYDROcodone-acetaminophen (NORCO/VICODIN) 5-325 MG tablet Take 1-2 tablets by mouth every 6 (six) hours as needed for moderate pain. 10/21/18   Swayze, Ava, DO  levothyroxine (SYNTHROID, LEVOTHROID) 75 MCG tablet Take 75 mcg by mouth daily before breakfast.     [provider]  loperamide (IMODIUM A-D) 2 MG tablet Take 2 mg by mouth 4 (four) times daily as needed for diarrhea or loose stools.    [provider]  loratadine (CLARITIN) 10 MG tablet Take 10 mg by mouth daily.    [provider]  meclizine (ANTIVERT) 25 MG tablet Take 25 mg by mouth 2 (two) times daily as needed for dizziness or nausea.  02/18/14   [provider]  Memantine HCl-Donepezil HCl (NAMZARIC) 28-10 MG CP24 Take 1 capsule by mouth daily. 04/13/19   Pieter Partridge, DO  Multiple Vitamin (MULTIVITAMIN WITH MINERALS) TABS tablet Take 1 tablet by mouth daily.    [provider]  nitrofurantoin, macrocrystal-monohydrate, (MACROBID) 100 MG capsule Take 1 capsule (100 mg total) by mouth 2 (two) times daily. 12/23/18   Fredia Sorrow, MD  pravastatin (PRAVACHOL) 40 MG tablet Take 40 mg by mouth daily.      [provider]  RABEprazole (ACIPHEX) 20 MG tablet Take 20 mg by mouth daily.     [provider]  ROCKLATAN 0.02-0.005 % SOLN Place 1 drop into both eyes at bedtime. 10/09/18   [provider]  sodium chloride 1 G tablet Take 1 tablet (1 g total) by mouth 2 (two) times daily with a meal. 08/07/13   Donne Hazel, MD    Allergies    Benadryl [diphenhydramine hcl], Eye drops [tetrahydrozoline hcl], Flu virus vaccine, Iohexol, Shrimp [shellfish allergy], Voltaren [diclofenac sodium], Ambien [zolpidem tartrate], Gluten meal, Travatan z [travoprost], Ativan [lorazepam], Hydrocodone, Betadine antibiotic-moisturize [bacitracin-polymyxin b], Biaxin [clarithromycin], Celebrex [celecoxib], Ciprofloxacin hcl, Combigan [brimonidine tartrate-timolol], Coumadin [warfarin sodium], Ivp dye [iodinated diagnostic agents], Levaquin [levofloxacin in d5w], Naprosyn [naproxen], Pataday [olopatadine hcl], Penicillins, Plavix [clopidogrel bisulfate], and Septra [sulfamethoxazole-trimethoprim]  Review of Systems   Review of Systems  Constitutional: Negative for fever.  HENT: Negative for sore throat.   Eyes: Negative for visual disturbance.  Respiratory: Positive for shortness of breath. Negative for cough and sputum production.   Cardiovascular: Negative for chest pain.  Gastrointestinal: Negative for abdominal pain and vomiting.  Genitourinary: Negative for dysuria.  Musculoskeletal: Negative for neck pain.  Skin: Negative for rash.  Neurological: Positive for speech difficulty and weakness. Negative for headaches.    Physical Exam Updated Vital Signs BP (!) 136/56 (BP Location: Right Arm)   Pulse 68   Temp 97.6 F (36.4 C) (Oral)   Resp 16   SpO2 97%   Physical Exam Vitals and nursing note reviewed.  Constitutional:      General: She is not in acute distress.    Appearance: She is well-developed.  HENT:     Head: Normocephalic and atraumatic.  Eyes:      Conjunctiva/sclera: Conjunctivae normal.  Cardiovascular:     Rate and Rhythm: Normal rate and regular rhythm.     Heart sounds: No murmur.  Pulmonary:     Effort: Pulmonary effort is normal. No respiratory distress.  Breath sounds: Normal breath sounds.  Abdominal:     Palpations: Abdomen is soft.     Tenderness: There is no abdominal tenderness.  Musculoskeletal:        General: Deformity present.     Cervical back: Neck supple.     Right lower leg: Edema present.  Skin:    General: Skin is warm and dry.     Capillary Refill: Capillary refill takes less than 2 seconds.  Neurological:     General: No focal deficit present.     Mental Status: She is alert. Mental status is at baseline.     Cranial Nerves: No cranial nerve deficit.     Comments: Patient is awake and alert.  No gross facial asymmetry.  Pupils equal round reactive to light.  Upper extremity strength symmetric.  Lower extremity strength difficult to assess due to her prior knee injury.  Gait not tested secondary to same.     ED Results / Procedures / Treatments   Labs (all labs ordered are listed, but only abnormal results are displayed) Labs Reviewed  PROTIME-INR - Abnormal; Notable for the following components:      Result Value   Prothrombin Time 17.0 (*)    INR 1.4 (*)    All other components within normal limits  CBC - Abnormal; Notable for the following components:   RBC 3.45 (*)    Hemoglobin 9.2 (*)    HCT 28.6 (*)    RDW 16.7 (*)    All other components within normal limits  COMPREHENSIVE METABOLIC PANEL - Abnormal; Notable for the following components:   Sodium 129 (*)    Chloride 94 (*)    Glucose, Bld 106 (*)    BUN 25 (*)    Creatinine, Ser 1.25 (*)    Total Protein 6.3 (*)    GFR calc non Af Amer 39 (*)    GFR calc Af Amer 45 (*)    All other components within normal limits  I-STAT CHEM 8, ED - Abnormal; Notable for the following components:   Sodium 129 (*)    Chloride 94 (*)    BUN  24 (*)    Creatinine, Ser 1.30 (*)    Glucose, Bld 102 (*)    Hemoglobin 9.5 (*)    HCT 28.0 (*)    All other components within normal limits  SARS CORONAVIRUS 2 (TAT 6-24 HRS)  ETHANOL  APTT  DIFFERENTIAL  BRAIN NATRIURETIC PEPTIDE  RAPID URINE DRUG SCREEN, HOSP PERFORMED  URINALYSIS, ROUTINE W REFLEX MICROSCOPIC  HEMOGLOBIN A1C  TSH  LIPID PANEL  TROPONIN I (HIGH SENSITIVITY)  TROPONIN I (HIGH SENSITIVITY)    EKG EKG Interpretation  Date/Time:  Monday April 26 2019 12:33:20 EST Ventricular Rate:  65 PR Interval:    QRS Duration: 90 QT Interval:  403 QTC Calculation: 419 R Axis:   13 Text Interpretation: Sinus rhythm Abnormal R-wave progression, early transition No significant change since 1/21 Confirmed by Aletta Edouard [phone]) on 04/26/2019 12:42:01 PM   Radiology CT HEAD WO CONTRAST  Result Date: 04/26/2019 CLINICAL DATA:  Episode of right-sided weakness and facial droop EXAM: CT HEAD WITHOUT CONTRAST TECHNIQUE: Contiguous axial images were obtained from the base of the skull through the vertex without intravenous contrast. COMPARISON:  03/09/2019 FINDINGS: Brain: There is no acute intracranial hemorrhage, mass-effect, or edema. There is no new loss of gray-white differentiation. Small chronic infarcts are identified within the left cerebellum and left basal ganglia. Patchy and confluent areas of  hypoattenuation in the supratentorial white matter are nonspecific but probably reflect stable moderate chronic microvascular ischemic changes. There is no extra-axial fluid collection. Ventricles and sulci are stable in size and configuration. Vascular: There is atherosclerotic calcification at the skull base. Skull: Calvarium is unremarkable. Sinuses/Orbits: No acute finding. Other: None. IMPRESSION: No acute intracranial abnormality. Stable chronic findings detailed above. Electronically Signed   By: Macy Mis M.D.   On: 04/26/2019 14:12   DG Chest Port 1 View  Result  Date: 04/26/2019 CLINICAL DATA:  Right-sided weakness, right-sided facial droop, slurred speech, atrial fibrillation EXAM: PORTABLE CHEST 1 VIEW COMPARISON:  04/03/2019 FINDINGS: The heart size and mediastinal contours are within normal limits. Both lungs are clear. The visualized skeletal structures are unremarkable. IMPRESSION: No active disease. Electronically Signed   By: Randa Ngo M.D.   On: 04/26/2019 13:10    Procedures Procedures (including critical care time)  Medications Ordered in ED Medications  acetaminophen-codeine (TYLENOL #3) 300-30 MG per tablet 1 tablet (has no administration in time range)  cephALEXin (KEFLEX) capsule 250 mg (has no administration in time range)  pravastatin (PRAVACHOL) tablet 40 mg (has no administration in time range)  Memantine HCl-Donepezil HCl 28-10 MG CP24 1 capsule (has no administration in time range)  levothyroxine (SYNTHROID) tablet 75 mcg (has no administration in time range)  loperamide (IMODIUM A-D) tablet 2 mg (has no administration in time range)  apixaban (ELIQUIS) tablet 5 mg (has no administration in time range)  folic acid (FOLVITE) tablet 1 mg (has no administration in time range)  carbamazepine (CARBATROL) 12 hr capsule 100 mg (has no administration in time range)  gabapentin (NEURONTIN) capsule 300 mg (has no administration in time range)  cholecalciferol (VITAMIN D3) tablet 2,000 Units (has no administration in time range)  multivitamin with minerals tablet 1 tablet (has no administration in time range)  fluticasone (FLONASE) 50 MCG/ACT nasal spray 1 spray (has no administration in time range)  loratadine (CLARITIN) tablet 10 mg (has no administration in time range)  Netarsudil-Latanoprost 0.02-0.005 % SOLN 1 drop (has no administration in time range)  sodium chloride flush (NS) 0.9 % injection 3 mL (has no administration in time range)   stroke: mapping our early stages of recovery book (has no administration in time range)     ED Course  I have reviewed the triage vital signs and the nursing notes.  Pertinent labs & imaging results that were available during my care of the patient were reviewed by me and considered in my medical decision making (see chart for details).  Clinical Course as of Apr 26 1711  Mon Apr 26, 2019  1251 Differential includes stroke, TIA, bleed, CHF, PE, pneumonia, ACS, pneumothorax, seizure, syncope   [MB]  1333 Anemia consistent with her prior levels.   [MB]  [id number] Neurology is consulted and they are recommending coming in for get further work-up.  Story sounds a little bit more like syncope possible vagal but does have some hyponatremia and risk factors for TIA.   [MB]  1529 Discussed with Dr. Roosevelt Locks from [facility] hospitalist to evaluate the patient for admission.   [MB]    Clinical Course User Index [MB] Hayden Rasmussen, MD   MDM Rules/Calculators/A&P                       Final Clinical Impression(s) / ED Diagnoses Final diagnoses:  Transient alteration of awareness    Rx / DC Orders ED Discharge Orders  None       Hayden Rasmussen, MD 04/26/19 1714

## 2019-04-26 NOTE — Consult Note (Addendum)
Neurology Consultation  Reason for Consult: Transient loss of consciousness Referring Physician: Dr. Melina Copa  CC: Possible TIA  History is obtained from: Patient's daughter  HPI: [name] is an 86 y.o. female with a history of trigeminal neuralgia, migraine headaches, memory loss, junctional bradycardia, hypertension, hypercholesterolemia, dementia, history of CVA back in 1964, and atrial fibrillation on Eliquis.  Per daughter, patient recently moved into the daughter's house after living in assisted living.  She has been doing well over the last couple of days.  However, today while getting ready for an Orthopedics appointment, she was transferred to a chair which was followed by loss of consciousness.  Time of symptom onset was 1110. Patient was brought to the ED and Neurology was consulted for this.  Of note, she does have a significant amount of pain in her right knee secondary to an injury for which she was going to an Orthopedic appointment today.  When she was placed in a wheelchair she suddenly was noted to have her head rolled to the side with her tongue hanging out, eyes open but no response to verbal stimuli.  She did urinate on herself.  After approximately 3 to 5 minutes, per daughter, the patient slowly came back to normal.  Daughter also mentioned that while she was in assisted living previously, a very similar episode had occurred, during transfer from bed to chair.  Again, it was just a brief transient loss of consciousness.  In addition it is also noted that patient often times will get slurred speech and has had difficulty recalling words when she has had a UTI in the past.  Patient was recently diagnosed with UTI. She currently is on no medications for a UTI.  Patient has no history of seizure.  Currently patient does recall being transferred but does not recall the subsequent transient episode of loss of consciousness.  Patient does have word finding difficulties at times but  states that this is baseline for her.  Daughter also states that it is not unusual for her mother to have some difficulty finding words.   ED course  Relevant labs include -sodium 129, BUN 24, creatinine 1.30, CT head shows-no acute intracranial abnormality.  LKW: 1110 tpa given?: no, symptoms resolved Premorbid modified Rankin scale (mRS): 4 NIH stroke score: 2  Past Medical History:  Diagnosis Date  . A-fib ([place])   . Acid reflux   . Anxiety   . Arthritis   . Chronic anticoagulation 03/07/2013   Lovenox 100 QD  . Chronic kidney disease    Stage III  . CVA (cerebral infarction) 1964  . Dementia [facility])    Dr Brett Fairy  . Diverticulosis   . DVT (deep venous thrombosis) ([place]) 2007   recurrent w PE s/p [place] filter  . GERD (gastroesophageal reflux disease)    w hiatal hernia endoscopy 2003, Dr Penelope Coop  . Glaucoma   . Hypercholesterolemia   . Hypertension   . Hypoglycemia   . Hypothyroidism    goiter  . IBS (irritable bowel syndrome)   . Junctional bradycardia    resolved with discontinuation of sotalol (2014) after 10 yrs  . Memory loss   . Migraine   . Osteoarthritis    in Bilateral knees and back. -Dr Latanya Maudlin  . Osteoporosis   . Panic attacks   . Phlebitis   . Postherpetic trigeminal neuralgia 10/20/2012  . Pulmonary embolism recurrent    IVC filter  . Shingles    zoster 1990 on the back and  on the abdomen in 2007,facial zoster 2006  . Trigeminal neuralgia    r face--Dr Dohmeier  . Unspecified cerebral artery occlusion with cerebral infarction   . Vitamin D deficiency    Family History  Problem Relation Age of Onset  . Diabetes Mother   . Hypertension Mother   . Glaucoma Mother   . CVA Mother   . CAD Mother   . Hypertension Father   . Heart attack Father   . Kidney failure Father   . Heart attack Brother        2 half brothers  . Hypertension Brother   . CVA Brother   . Glaucoma Other   . Glaucoma Other   . Prostate cancer Maternal Grandfather    . Breast cancer Maternal Aunt   . Colon cancer Maternal Uncle   . Rectal cancer Maternal Uncle    Social History:   reports that she has never smoked. She has never used smokeless tobacco. She reports that she does not drink alcohol or use drugs.  Medications No current facility-administered medications for this encounter.  Current Outpatient Medications:  .  amLODipine (NORVASC) 2.5 MG tablet, Take 1 tablet (2.5 mg total) by mouth daily. Please make overdue appt for future refills. [phone]. 2nd attempt, Disp: 15 tablet, Rfl: 0 .  carbamazepine (CARBATROL) 100 MG 12 hr capsule, TAKE 2 CAPSULES BY MOUTH 2 TIMES DAILY, Disp: 120 capsule, Rfl: 0 .  cholecalciferol (VITAMIN D) 1000 units tablet, Take 1,000 Units by mouth daily., Disp: , Rfl:  .  enoxaparin (LOVENOX) 100 MG/ML injection, Inject 100 mg into the skin at bedtime. , Disp: , Rfl:  .  EPINEPHrine (EPIPEN 2-PAK) 0.3 mg/0.3 mL IJ SOAJ injection, Inject 0.3 mg into the muscle as needed for anaphylaxis., Disp: , Rfl:  .  feeding supplement, ENSURE ENLIVE, (ENSURE ENLIVE) LIQD, Take 237 mLs by mouth daily at 3 pm., Disp: 237 mL, Rfl: 12 .  fluticasone (FLONASE) 50 MCG/ACT nasal spray, Place 1 spray into left nostril daily as needed for allergies or rhinitis. , Disp: , Rfl: 12 .  folic acid (FOLVITE) 1 MG tablet, Take 1 mg by mouth daily. , Disp: , Rfl:  .  gabapentin (NEURONTIN) 300 MG capsule, Take 1 capsule (300 mg total) by mouth 2 (two) times daily., Disp: 60 capsule, Rfl: 11 .  HYDROcodone-acetaminophen (NORCO/VICODIN) 5-325 MG tablet, Take 1-2 tablets by mouth every 6 (six) hours as needed for moderate pain., Disp: 30 tablet, Rfl: 0 .  levothyroxine (SYNTHROID, LEVOTHROID) 75 MCG tablet, Take 75 mcg by mouth daily before breakfast. , Disp: , Rfl:  .  loperamide (IMODIUM A-D) 2 MG tablet, Take 2 mg by mouth 4 (four) times daily as needed for diarrhea or loose stools., Disp: , Rfl:  .  loratadine (CLARITIN) 10 MG tablet, Take 10 mg  by mouth daily., Disp: , Rfl:  .  meclizine (ANTIVERT) 25 MG tablet, Take 25 mg by mouth 2 (two) times daily as needed for dizziness or nausea. , Disp: , Rfl: 1 .  Memantine HCl-Donepezil HCl (NAMZARIC) 28-10 MG CP24, Take 1 capsule by mouth daily., Disp: 30 capsule, Rfl: 0 .  Multiple Vitamin (MULTIVITAMIN WITH MINERALS) TABS tablet, Take 1 tablet by mouth daily., Disp: , Rfl:  .  nitrofurantoin, macrocrystal-monohydrate, (MACROBID) 100 MG capsule, Take 1 capsule (100 mg total) by mouth 2 (two) times daily., Disp: 10 capsule, Rfl: 0 .  pravastatin (PRAVACHOL) 40 MG tablet, Take 40 mg by mouth daily. , Disp: ,  Rfl:  .  RABEprazole (ACIPHEX) 20 MG tablet, Take 20 mg by mouth daily. , Disp: , Rfl:  .  ROCKLATAN 0.02-0.005 % SOLN, Place 1 drop into both eyes at bedtime., Disp: , Rfl:  .  sodium chloride 1 G tablet, Take 1 tablet (1 g total) by mouth 2 (two) times daily with a meal., Disp: 60 tablet, Rfl: 0  ROS:    General ROS: negative for - chills, fatigue, fever, night sweats, weight gain or weight loss Ophthalmic ROS: negative for - blurry vision, double vision, eye pain or loss of vision ENT ROS: negative for - epistaxis, nasal discharge, oral lesions, sore throat, tinnitus or vertigo Allergy and Immunology ROS: negative for - hives or itchy/watery eyes Hematological and Lymphatic ROS: negative for - bleeding problems, bruising or swollen lymph nodes Endocrine ROS: negative for - galactorrhea, hair pattern changes, polydipsia/polyuria or temperature intolerance Respiratory ROS: negative for - cough, hemoptysis, shortness of breath or wheezing Cardiovascular ROS: negative for - chest pain, dyspnea on exertion, edema or irregular heartbeat Gastrointestinal ROS: negative for - abdominal pain, diarrhea, hematemesis, nausea/vomiting or stool incontinence Genito-Urinary ROS: negative for - dysuria, hematuria, incontinence or urinary frequency/urgency Musculoskeletal ROS: negative for - joint  swelling or muscular weakness Neurological ROS: as noted in HPI Dermatological ROS: negative for rash and skin lesion changes  Exam: Current vital signs: BP (!) 141/60   Pulse 61   Temp 97.6 F (36.4 C) (Oral)   Resp 10   SpO2 96%  Vital signs in last 24 hours: Temp:  [97.6 F (36.4 C)] 97.6 F (36.4 C) (02/01 1234) Pulse Rate:  [61-68] 61 (02/01 1330) Resp:  [10-19] 10 (02/01 1330) BP: (115-141)/(51-63) 141/60 (02/01 1330) SpO2:  [95 %-97 %] 96 % (02/01 1330)   Constitutional: Appears well-developed and well-nourished.  Psych: Affect appropriate to situation Eyes: No scleral injection HENT: No OP obstrucion Head: Normocephalic.  Cardiovascular: Irregular irregular Respiratory: Effort normal, non-labored breathing GI: Soft.  No distension. There is no tenderness.  Skin: WDI  Neuro: Mental Status: Patient is awake, alert, oriented to person, place, month, year situation Speech-has some difficulty with naming but given time she is able to name objects (patient states that this is not a new issue--daughter states that this is often seen with UTI), no difficulty with repeating, comprehension intact. Patient is unable to give a clear history. Cranial Nerves: II: Visual Fields are full.  III,IV, VI: EOMI without ptosis or diploplia. Pupils equal, round and reactive to light V: Facial sensation is symmetric to temperature VII: Facial movement is symmetric.  However at rest there might be a slight right nasolabial fold decrease VIII: hearing is intact to voice X: Palat elevates symmetrically XI: Shoulder shrug is symmetric. XII: tongue is midline without atrophy or fasciculations.  Motor: Tone is normal. Bulk is normal. 5/5 strength was present in bilateral upper extremities, 4/5 strength on the left lower extremity and difficult to ascertain on the right lower extremity secondary to pain Drift-in bilateral lower extremities aterixis not present Sensory: Sensation is  symmetric to light touch and temperature in the arms and legs. Deep Tendon Reflexes: 1+ in the upper extremities no lower extremity reflexes Plantars: Mute Cerebellar: FNF shows essential tremor but no dysmetria.  Unable to do heel-to-shin secondary to weakness  Labs I have reviewed labs in epic and the results pertinent to this consultation are:   CBC    Component Value Date/Time   WBC 7.1 04/26/2019 1258   RBC 3.45 (L)  04/26/2019 1258   HGB 9.5 (L) 04/26/2019 1305   HGB 10.0 (L) 11/10/2012 0948   HCT 28.0 (L) 04/26/2019 1305   HCT 29.5 (L) 11/10/2012 0948   PLT 153 04/26/2019 1258   PLT 134 (L) 11/10/2012 0948   MCV 82.9 04/26/2019 1258   MCV 83.5 11/10/2012 0948   MCH 26.7 04/26/2019 1258   MCHC 32.2 04/26/2019 1258   RDW 16.7 (H) 04/26/2019 1258   RDW 18.1 (H) 11/10/2012 0948   LYMPHSABS 1.1 04/26/2019 1258   LYMPHSABS 1.3 11/10/2012 0948   MONOABS 0.8 04/26/2019 1258   MONOABS 1.1 (H) 11/10/2012 0948   EOSABS 0.3 04/26/2019 1258   EOSABS 0.2 11/10/2012 0948   BASOSABS 0.1 04/26/2019 1258   BASOSABS 0.1 11/10/2012 0948    CMP     Component Value Date/Time   NA 129 (L) 04/26/2019 1305   NA 139 04/22/2013 1549   NA 132 (L) 10/23/2012 1119   K 4.3 04/26/2019 1305   K 5.0 10/23/2012 1119   CL 94 (L) 04/26/2019 1305   CO2 22 04/26/2019 1258   CO2 22 10/23/2012 1119   GLUCOSE 102 (H) 04/26/2019 1305   GLUCOSE 90 10/23/2012 1119   BUN 24 (H) 04/26/2019 1305   BUN 16 04/22/2013 1549   BUN 19.4 10/23/2012 1119   CREATININE 1.30 (H) 04/26/2019 1305   CREATININE 1.16 (H) 05/17/2014 1151   CREATININE 1.1 10/23/2012 1119   CALCIUM 9.2 04/26/2019 1258   CALCIUM 8.8 10/23/2012 1119   PROT 6.3 (L) 04/26/2019 1258   PROT 6.9 10/21/2012 1535   ALBUMIN 3.5 04/26/2019 1258   ALBUMIN 3.8 10/21/2012 1535   AST 18 04/26/2019 1258   AST 12 10/21/2012 1535   ALT 14 04/26/2019 1258   ALT 7 10/21/2012 1535   ALKPHOS 64 04/26/2019 1258   ALKPHOS 86 10/21/2012 1535    BILITOT 0.6 04/26/2019 1258   BILITOT 0.26 10/21/2012 1535   GFRNONAA 39 (L) 04/26/2019 1258   GFRNONAA 44 (L) 05/17/2014 1151   GFRAA 45 (L) 04/26/2019 1258   GFRAA 51 (L) 05/17/2014 1151    Lipid Panel     Component Value Date/Time   CHOL 156 12/04/2013 0615   TRIG 72 12/04/2013 0615   HDL 76 12/04/2013 0615   CHOLHDL 2.1 12/04/2013 0615   VLDL 14 12/04/2013 0615   LDLCALC 66 12/04/2013 0615     Imaging I have reviewed the images obtained:  CT-scan of the brain-no acute intracranial abnormality   Etta Quill PA-C [facility] [phone] 04/26/2019, 3:05 PM     Assessment: 86 year old female with dementia, recent UTI, episode of transient loss of consciousness today after being transferred from bed to wheelchair.  Given description that the patient suddenly drooped head to the right with tongue sitting outside of her mouth, no jerking or rhythmic activity, fairly brief period of loss of consciousness followed by mild confusion, the most likely etiology is felt to be syncope. She has had one similar episode in the recent past.  Less likely would be a TIA.   Impression: -Dementia -Possible syncope -Possible vagal event -Possible TIA  Recommend -MRI of the brain without contrast -MRA Head -Carotid ultrasound -Transthoracic Echo  -Continue Eliquis  -BP goal: Normotensive. Most likely etiology for her presentation is syncope rather than stroke.  -HBAIC and Lipid profile -Telemetry monitoring -Frequent neuro checks -NPO until passes stroke swallow screen -PT/OT   I have seen and examined the patient. I have formulated the assessment and recommendations. 86 year  old female with probable syncopal episode. Recommended studies and treatment plan as above.  Electronically signed: Dr. Kerney Elbe

## 2019-04-26 NOTE — ED Notes (Signed)
[place]  Daughter call for an update

## 2019-04-26 NOTE — ED Notes (Signed)
Got patient undress on the monitor into a gown did ekg shown to Dr Melina Copa patient is resting with call bell in reach

## 2019-04-27 ENCOUNTER — Observation Stay (HOSPITAL_COMMUNITY): Payer: Medicare Other

## 2019-04-27 ENCOUNTER — Encounter (HOSPITAL_COMMUNITY): Payer: Self-pay | Admitting: Internal Medicine

## 2019-04-27 ENCOUNTER — Observation Stay (HOSPITAL_BASED_OUTPATIENT_CLINIC_OR_DEPARTMENT_OTHER): Payer: Medicare Other

## 2019-04-27 DIAGNOSIS — M2550 Pain in unspecified joint: Secondary | ICD-10-CM | POA: Diagnosis not present

## 2019-04-27 DIAGNOSIS — R404 Transient alteration of awareness: Secondary | ICD-10-CM | POA: Diagnosis not present

## 2019-04-27 DIAGNOSIS — T40605A Adverse effect of unspecified narcotics, initial encounter: Secondary | ICD-10-CM | POA: Diagnosis present

## 2019-04-27 DIAGNOSIS — W19XXXD Unspecified fall, subsequent encounter: Secondary | ICD-10-CM | POA: Diagnosis present

## 2019-04-27 DIAGNOSIS — R55 Syncope and collapse: Secondary | ICD-10-CM | POA: Diagnosis present

## 2019-04-27 DIAGNOSIS — I10 Essential (primary) hypertension: Secondary | ICD-10-CM | POA: Diagnosis not present

## 2019-04-27 DIAGNOSIS — G459 Transient cerebral ischemic attack, unspecified: Secondary | ICD-10-CM

## 2019-04-27 DIAGNOSIS — I080 Rheumatic disorders of both mitral and aortic valves: Secondary | ICD-10-CM | POA: Diagnosis present

## 2019-04-27 DIAGNOSIS — I129 Hypertensive chronic kidney disease with stage 1 through stage 4 chronic kidney disease, or unspecified chronic kidney disease: Secondary | ICD-10-CM | POA: Diagnosis present

## 2019-04-27 DIAGNOSIS — R5381 Other malaise: Secondary | ICD-10-CM | POA: Diagnosis present

## 2019-04-27 DIAGNOSIS — E785 Hyperlipidemia, unspecified: Secondary | ICD-10-CM | POA: Diagnosis present

## 2019-04-27 DIAGNOSIS — D638 Anemia in other chronic diseases classified elsewhere: Secondary | ICD-10-CM | POA: Diagnosis not present

## 2019-04-27 DIAGNOSIS — F0390 Unspecified dementia without behavioral disturbance: Secondary | ICD-10-CM | POA: Diagnosis present

## 2019-04-27 DIAGNOSIS — Z91013 Allergy to seafood: Secondary | ICD-10-CM | POA: Diagnosis not present

## 2019-04-27 DIAGNOSIS — Z20822 Contact with and (suspected) exposure to covid-19: Secondary | ICD-10-CM | POA: Diagnosis present

## 2019-04-27 DIAGNOSIS — R4020 Unspecified coma: Secondary | ICD-10-CM | POA: Diagnosis not present

## 2019-04-27 DIAGNOSIS — I340 Nonrheumatic mitral (valve) insufficiency: Secondary | ICD-10-CM | POA: Diagnosis not present

## 2019-04-27 DIAGNOSIS — Z888 Allergy status to other drugs, medicaments and biological substances status: Secondary | ICD-10-CM | POA: Diagnosis not present

## 2019-04-27 DIAGNOSIS — N1832 Chronic kidney disease, stage 3b: Secondary | ICD-10-CM | POA: Diagnosis present

## 2019-04-27 DIAGNOSIS — Z885 Allergy status to narcotic agent status: Secondary | ICD-10-CM | POA: Diagnosis not present

## 2019-04-27 DIAGNOSIS — T4275XA Adverse effect of unspecified antiepileptic and sedative-hypnotic drugs, initial encounter: Secondary | ICD-10-CM | POA: Diagnosis present

## 2019-04-27 DIAGNOSIS — Z880 Allergy status to penicillin: Secondary | ICD-10-CM | POA: Diagnosis not present

## 2019-04-27 DIAGNOSIS — N390 Urinary tract infection, site not specified: Secondary | ICD-10-CM | POA: Diagnosis present

## 2019-04-27 DIAGNOSIS — E871 Hypo-osmolality and hyponatremia: Secondary | ICD-10-CM | POA: Diagnosis present

## 2019-04-27 DIAGNOSIS — I480 Paroxysmal atrial fibrillation: Secondary | ICD-10-CM | POA: Diagnosis not present

## 2019-04-27 DIAGNOSIS — Z87440 Personal history of urinary (tract) infections: Secondary | ICD-10-CM | POA: Diagnosis not present

## 2019-04-27 DIAGNOSIS — I959 Hypotension, unspecified: Secondary | ICD-10-CM | POA: Diagnosis not present

## 2019-04-27 DIAGNOSIS — T421X5A Adverse effect of iminostilbenes, initial encounter: Secondary | ICD-10-CM | POA: Diagnosis present

## 2019-04-27 DIAGNOSIS — N183 Chronic kidney disease, stage 3 unspecified: Secondary | ICD-10-CM | POA: Diagnosis present

## 2019-04-27 DIAGNOSIS — Z8673 Personal history of transient ischemic attack (TIA), and cerebral infarction without residual deficits: Secondary | ICD-10-CM | POA: Diagnosis not present

## 2019-04-27 DIAGNOSIS — Y92009 Unspecified place in unspecified non-institutional (private) residence as the place of occurrence of the external cause: Secondary | ICD-10-CM | POA: Diagnosis not present

## 2019-04-27 DIAGNOSIS — R531 Weakness: Secondary | ICD-10-CM | POA: Diagnosis not present

## 2019-04-27 DIAGNOSIS — Z881 Allergy status to other antibiotic agents status: Secondary | ICD-10-CM | POA: Diagnosis not present

## 2019-04-27 DIAGNOSIS — I351 Nonrheumatic aortic (valve) insufficiency: Secondary | ICD-10-CM

## 2019-04-27 DIAGNOSIS — Z7401 Bed confinement status: Secondary | ICD-10-CM | POA: Diagnosis not present

## 2019-04-27 DIAGNOSIS — Z91018 Allergy to other foods: Secondary | ICD-10-CM | POA: Diagnosis not present

## 2019-04-27 DIAGNOSIS — S8991XA Unspecified injury of right lower leg, initial encounter: Secondary | ICD-10-CM | POA: Diagnosis present

## 2019-04-27 DIAGNOSIS — D631 Anemia in chronic kidney disease: Secondary | ICD-10-CM | POA: Diagnosis present

## 2019-04-27 DIAGNOSIS — N189 Chronic kidney disease, unspecified: Secondary | ICD-10-CM | POA: Diagnosis present

## 2019-04-27 DIAGNOSIS — Z91041 Radiographic dye allergy status: Secondary | ICD-10-CM | POA: Diagnosis not present

## 2019-04-27 LAB — LIPID PANEL
Cholesterol: 127 mg/dL (ref 0–200)
HDL: 59 mg/dL (ref >40)
LDL Cholesterol: 62 mg/dL (ref 0–99)
Total CHOL/HDL Ratio: 2.2 RATIO
Triglycerides: 32 mg/dL (ref <150)
VLDL: 6 mg/dL (ref 0–40)

## 2019-04-27 LAB — OSMOLALITY: Osmolality: 280 mOsm/kg (ref 275–295)

## 2019-04-27 LAB — ECHOCARDIOGRAM COMPLETE: Weight: 3202.84 oz

## 2019-04-27 LAB — GLUCOSE, CAPILLARY: Glucose-Capillary: 89 mg/dL (ref 70–99)

## 2019-04-27 MED ORDER — SODIUM CHLORIDE 0.9 % IV SOLN: INTRAVENOUS | Status: DC

## 2019-04-27 MED ORDER — PERFLUTREN LIPID MICROSPHERE
1.0000 mL | INTRAVENOUS | Status: AC | PRN
  Administered 2019-04-27: 3 mL via INTRAVENOUS
  Filled 2019-04-27: qty 10

## 2019-04-27 NOTE — Progress Notes (Addendum)
Occupational Therapy Evaluation Patient Details Name: [name] MRN: [id number] DOB: [birth year] Today's Date: 04/27/2019    History of Present Illness 86 y.o. female with medical history significant for R distal femur fx July 2020, trigeminal neuralgia, migraine, memory loss, junctional bradycardia, hypertension, high cholesterolemia, dementia, history of CVA back in 1964, atrial fibrillation on Eliquis presented with an episode of TIA vs Syncope at home.   Clinical Impression   PTA, pt was living at home with her daughter, she is a poor historian and unable to provide level of assistance provided by family with ADL/IADL and transfers to her w/c with use of transfer board. She reports she has been non-ambulatory since R femur fracture 2 years ago, but per chart, fx was in July 2020. Pt currently requires totalA+2 for lateral scoot transfer to drop arm recliner. Due to decline in current level of function, pt would benefit from acute OT to address established goals to facilitate safe D/C to venue listed below. At this time, recommend SNF follow-up. Will continue to follow acutely.  BP supine:  BP 140/55 HR 68  Sitting EOB: BP 135/69 HR 68     Follow Up Recommendations  SNF;Supervision/Assistance - 24 hour    Equipment Recommendations  3 in 1 bedside commode;Hospital bed(needs drop arm 3in1)    Recommendations for Other Services       Precautions / Restrictions Precautions Precautions: Fall Restrictions Weight Bearing Restrictions: No      Mobility Bed Mobility Overal bed mobility: Needs Assistance Bed Mobility: Supine to Sit     Supine to sit: +2 for physical assistance;Total assist     General bed mobility comments: assist to raise trunk and advance BLEs  Transfers Overall transfer level: Needs assistance   Transfers: Lateral/Scoot Transfers          Lateral/Scoot Transfers: +2 physical assistance;+2 safety/equipment;Total assist General transfer comment:  lateral transfer bed to recliner with armrest lowered    Balance Overall balance assessment: Needs assistance Sitting-balance support: Feet supported;Bilateral upper extremity supported Sitting balance-Leahy Scale: Poor Sitting balance - Comments: mod assist initially for posterior lean then min to min/guard Postural control: Posterior lean                                 ADL either performed or assessed with clinical judgement   ADL Overall ADL's : Needs assistance/impaired Eating/Feeding: Set up;Sitting   Grooming: Set up;Sitting   Upper Body Bathing: Minimal assistance;Sitting   Lower Body Bathing: Maximal assistance;Sitting/lateral leans   Upper Body Dressing : Minimal assistance;Sitting;Moderate assistance   Lower Body Dressing: Maximal assistance;Sitting/lateral leans   Toilet Transfer: Total assistance;+2 for physical assistance;+2 for safety/equipment Toilet Transfer Details (indicate cue type and reason): simulated lateral scoot to recliner Toileting- Clothing Manipulation and Hygiene: Total assistance       Functional mobility during ADLs: Maximal assistance;+2 for physical assistance;+2 for safety/equipment General ADL Comments: limited by deconditioning, weakness, cognition     Vision Baseline Vision/History: Wears glasses Wears Glasses: At all times Patient Visual Report: No change from baseline Vision Assessment?: Yes Eye Alignment: Within Functional Limits Ocular Range of Motion: Within Functional Limits Alignment/Gaze Preference: Within Defined Limits Tracking/Visual Pursuits: Requires cues, head turns, or add eye shifts to track Convergence: Within functional limits Visual Fields: No apparent deficits     Perception     Praxis      Pertinent Vitals/Pain Pain Assessment: No/denies pain  Hand Dominance Right   Extremity/Trunk Assessment Upper Extremity Assessment Upper Extremity Assessment: Generalized weakness(sensation  WNL)   Lower Extremity Assessment Lower Extremity Assessment: Defer to PT evaluation RLE Deficits / Details: knee ext AROM -30*, knee ext +3/5 in available range, reports no sensation to light touch in B feet RLE Sensation: decreased light touch LLE Deficits / Details: knee ext 4/5 LLE Sensation: decreased light touch   Cervical / Trunk Assessment Cervical / Trunk Assessment: Kyphotic   Communication Communication Communication: No difficulties   Cognition Arousal/Alertness: Awake/alert Behavior During Therapy: WFL for tasks assessed/performed Overall Cognitive Status: No family/caregiver present to determine baseline cognitive functioning                                 General Comments: poor historian, oriented to self and location, not to month/year, increased time to respond to questions/commands   General Comments       Exercises     Shoulder Instructions      Home Living Family/patient expects to be discharged to:: Private residence Living Arrangements: Children Available Help at Discharge: Family;Available 24 hours/day Type of Home: House Home Access: Ramped entrance     Home Layout: Able to live on main level with bedroom/bathroom     Bathroom Shower/Tub: (sponge bathes)         Home Equipment: Wheelchair - manual   Additional Comments: pt confused about date and place, able to get date with verbal cues, oriented to self, vague historian (stated R distal femur fracture was 2 1/2 years ago, per chart it was July 2020)      Prior Functioning/Environment Level of Independence: Needs assistance  Gait / Transfers Assistance Needed: assist for [facility] transfers, hasn't walked since RLE fx in July 2020, assist for bed mobility, uses transfer board for [facility] transfers, hasn't ambulated since RLE fx ADL's / Homemaking Assistance Needed: sponge bathes with assist Communication / Swallowing Assistance Needed: increased processing time Comments: pt is a poor  historian and cannot relay PLOF but per chart she was up in kitchen when she fell        OT Problem List: Decreased strength;Decreased range of motion;Decreased activity tolerance;Impaired balance (sitting and/or standing);Decreased cognition;Decreased safety awareness;Decreased knowledge of precautions;Obesity      OT Treatment/Interventions: Self-care/ADL training;Therapeutic exercise;Energy conservation;DME and/or AE instruction;Therapeutic activities;Cognitive remediation/compensation;Patient/family education;Balance training    OT Goals(Current goals can be found in the care plan section) Acute Rehab OT Goals Patient Stated Goal: none stated OT Goal Formulation: With patient Time For Goal Achievement: 05/11/19 Potential to Achieve Goals: Good ADL Goals Pt Will Perform Grooming: with modified independence;sitting Pt Will Perform Upper Body Dressing: with set-up;sitting Pt Will Transfer to Toilet: with mod assist;with transfer board Additional ADL Goal #1: Pt will progress to EOB with minA in preparation for ADL.  OT Frequency: Min 2X/week   Barriers to D/C: Decreased caregiver support  pt lives with daughter       Co-evaluation PT/OT/SLP Co-Evaluation/Treatment: Yes Reason for Co-Treatment: Complexity of the patient's impairments (multi-system involvement);For patient/therapist safety;To address functional/ADL transfers PT goals addressed during session: Mobility/safety with mobility;Balance OT goals addressed during session: ADL's and self-care      AM-PAC OT "6 Clicks" Daily Activity     Outcome Measure Help from another person eating meals?: A Little Help from another person taking care of personal grooming?: A Little Help from another person toileting, which includes using toliet, bedpan, or urinal?: Total  Help from another person bathing (including washing, rinsing, drying)?: A Lot Help from another person to put on and taking off regular upper body clothing?: A  Little Help from another person to put on and taking off regular lower body clothing?: A Lot 6 Click Score: 14   End of Session Equipment Utilized During Treatment: Gait belt Nurse Communication: Mobility status  Activity Tolerance: Patient tolerated treatment well Patient left: in chair;with call bell/phone within reach;with chair alarm set;Other (comment)(with lift pad under her)  OT Visit Diagnosis: Other abnormalities of gait and mobility (R26.89);Unsteadiness on feet (R26.81);Muscle weakness (generalized) (M62.81);Other symptoms and signs involving cognitive function                Time: [id number] OT Time Calculation (min): 24 min Charges:  OT General Charges $OT Visit: 1 Visit OT Evaluation $OT Eval Moderate Complexity: [place] OTR/L Acute Rehabilitation Services Office: [place] 04/27/2019, 12:36 PM

## 2019-04-27 NOTE — Progress Notes (Signed)
RN paged NP that pt passed swallow test.  Diet added.

## 2019-04-27 NOTE — Progress Notes (Signed)
Hospital bed:   Length of Need 12 Months   Bed type Semi-electric   Hoyer Lift Yes      The patient had a TIA which requires her head to be elevated which is not feasible with a normal bed. She has the potential for aspiration if her head is not elevated. She will be having some of her meals in the bed and needs the elevation as she can not elevate her upper body independently.

## 2019-04-27 NOTE — Evaluation (Addendum)
Physical Therapy Evaluation Patient Details Name: [name] MRN: [id number] DOB: 08/22/1929 Today's Date: 04/27/2019   History of Present Illness  86 y.o. female with medical history significant for R distal femur fx July 2020, trigeminal neuralgia, migraine, memory loss, junctional bradycardia, hypertension, high cholesterolemia, dementia, history of CVA back in 1964, atrial fibrillation on Eliquis presented with an episode of TIA vs Syncope at home.  Clinical Impression  Pt admitted with above diagnosis. +2 total assist for bed mobility and for lateral scoot transfer from bed to recliner. At baseline, pt requires assist for sliding board transfer to [facility]. She has been non ambulatory since R distal femur fracture July 2020. She lives with her daughter. Pt is a poor historian, will need to confirm with family whether or not they can care for pt at home.  Pt currently with functional limitations due to the deficits listed below (see PT Problem List). Pt will benefit from skilled PT to increase their independence and safety with mobility to allow discharge to the venue listed below.       Follow Up Recommendations SNF;Supervision/Assistance - 24 hour;Supervision for mobility/OOB (SNF vs home with HHPT if family able to provide 24* assist and perform transfers)    Equipment Recommendations  Other (comment)(possibly hoyer lift if DC home)    Recommendations for Other Services       Precautions / Restrictions Precautions Precautions: Fall Restrictions Weight Bearing Restrictions: No      Mobility  Bed Mobility Overal bed mobility: Needs Assistance Bed Mobility: Supine to Sit     Supine to sit: +2 for physical assistance;Total assist     General bed mobility comments: assist to raise trunk and advance BLEs  Transfers Overall transfer level: Needs assistance   Transfers: Lateral/Scoot Transfers          Lateral/Scoot Transfers: +2 physical assistance;+2  safety/equipment;Total assist General transfer comment: lateral transfer bed to recliner with armrest lowered  Ambulation/Gait                Stairs            Wheelchair Mobility    Modified Rankin (Stroke Patients Only)   Premorbid 4   Current  4       Balance Overall balance assessment: Needs assistance Sitting-balance support: Feet supported;Bilateral upper extremity supported Sitting balance-Leahy Scale: Poor Sitting balance - Comments: mod assist initially for posterior lean then min to min/guard Postural control: Posterior lean                                   Pertinent Vitals/Pain Pain Assessment: No/denies pain    Home Living Family/patient expects to be discharged to:: Private residence Living Arrangements: Children Available Help at Discharge: Family;Available 24 hours/day Type of Home: House Home Access: Ramped entrance     Home Layout: Able to live on main level with bedroom/bathroom Home Equipment: Wheelchair - manual Additional Comments: pt confused about date and place, able to get date with verbal cues, oriented to self, vague historian (stated R distal femur fracture was 2 1/2 years ago, per chart it was July 2020)    Prior Function Level of Independence: Needs assistance   Gait / Transfers Assistance Needed: assist for [facility] transfers, hasn't walked since RLE fx in July 2020, assist for bed mobility, uses transfer board for [facility] transfers, hasn't ambulated since RLE fx  ADL's / Homemaking Assistance Needed: sponge bathes with  assist        Hand Dominance        Extremity/Trunk Assessment   Upper Extremity Assessment Upper Extremity Assessment: Defer to OT evaluation    Lower Extremity Assessment Lower Extremity Assessment: RLE deficits/detail;LLE deficits/detail RLE Deficits / Details: knee ext AROM -30*, knee ext +3/5 in available range, reports no sensation to light touch in B feet RLE Sensation: decreased light  touch LLE Deficits / Details: knee ext 4/5 LLE Sensation: decreased light touch       Communication   Communication: No difficulties  Cognition Arousal/Alertness: Awake/alert Behavior During Therapy: WFL for tasks assessed/performed Overall Cognitive Status: No family/caregiver present to determine baseline cognitive functioning                                 General Comments: poor historian, oriented to self and location, not to month/year, increased time to respond to questions/commands      General Comments      Exercises     Assessment/Plan    PT Assessment Patient needs continued PT services  PT Problem List Decreased strength;Decreased activity tolerance;Decreased range of motion;Decreased balance;Decreased cognition;Decreased mobility       PT Treatment Interventions Functional mobility training;Balance training;Therapeutic exercise;Therapeutic activities;Patient/family education    PT Goals (Current goals can be found in the Care Plan section)  Acute Rehab PT Goals Patient Stated Goal: none stated PT Goal Formulation: Patient unable to participate in goal setting Time For Goal Achievement: 05/11/19 Potential to Achieve Goals: Fair    Frequency Min 3X/week   Barriers to discharge        Co-evaluation PT/OT/SLP Co-Evaluation/Treatment: Yes Reason for Co-Treatment: Complexity of the patient's impairments (multi-system involvement);For patient/therapist safety;To address functional/ADL transfers PT goals addressed during session: Mobility/safety with mobility;Balance         AM-PAC PT "6 Clicks" Mobility  Outcome Measure Help needed turning from your back to your side while in a flat bed without using bedrails?: A Lot Help needed moving from lying on your back to sitting on the side of a flat bed without using bedrails?: Total Help needed moving to and from a bed to a chair (including a wheelchair)?: Total Help needed standing up from a  chair using your arms (e.g., wheelchair or bedside chair)?: Total Help needed to walk in hospital room?: Total Help needed climbing 3-5 steps with a railing? : Total 6 Click Score: 7    End of Session Equipment Utilized During Treatment: Gait belt Activity Tolerance: Patient tolerated treatment well;Patient limited by fatigue Patient left: in chair;with call bell/phone within reach;with chair alarm set;Other (comment)(lift pad under pt) Nurse Communication: Mobility status;Need for lift equipment PT Visit Diagnosis: Muscle weakness (generalized) (M62.81);Difficulty in walking, not elsewhere classified (R26.2)    Time: [id number] PT Time Calculation (min) (ACUTE ONLY): 26 min   Charges:   PT Evaluation $PT Eval Moderate Complexity: 1 Mod         Philomena Doheny PT 04/27/2019  Acute Rehabilitation Services Pager [phone] Office [phone]

## 2019-04-27 NOTE — Progress Notes (Signed)
Progress Note    BRONNIE Bishop  [id number] DOB: 24-Jan-1930  DOA: 04/26/2019 PCP: [name], MD    Brief Narrative:   Medical records reviewed and are as summarized below:  [name] is an 86 y.o. female's medical history including trigeminal neuralgia, migraines, dementia, junctional bradycardia, hypertension, hypercholesterolemia, history of CVA in 1964 and atrial fibrillation on Eliquis presents emergency department after having a loss of consciousness episode concerning for TIA versus syncope versus seizure.  Admitted for work-up and evaluation by neurology  Assessment/Plan:   Principal Problem:   TIA (transient ischemic attack) Active Problems:   Essential hypertension   Hyponatremia   Right knee injury   PAF (paroxysmal atrial fibrillation) (HCC)   Chronic anticoagulation   Chronic kidney disease   #1.  TIA versus syncope versus seizure.  She had a brief episode of loss of consciousness.  Episode described as she suddenly dropped her head to the right with tongue sitting outside her mouth eyes open not following commands.  No description of any jerking no incontinence.  Evaluated by neurology who opined most likely etiology is syncope related to vagal event.  MRI/MRI negative for acute abnormality.  2D echo with an EF of 60% normal left ventricle function no regional wall motion abnormalities no left ventricular hypertrophy.  Carotid Doppler reveals right ICA with 40 to 59% stenosis, left ICA with 1 to 39% stenosis, bilateral vertebral arteries demonstrate antegrade flow.  Lipid panel is unremarkable.  Urine drug screen positive for opiates.  TSH and hemoglobin A1c are within the limits of normal.  Not orthostatic -Evaluated by physical therapy occupational therapy recommend SNF.  Family has just taken patient out of a facility and want home health.  #2.  UTI.  History of same.  Home medications include Keflex as suppression therapy.  No complaints of  dysuria. -Continue home Keflex  #3.  Hypertension.  Controlled. -Continue home meds  #4.  Paroxysmal atrial fibrillation.  Home medications include Eliquis.  EKG with sinus rhythm.  No significant change from previous.  Of note pharmacy raises question of Eliquis and carbamazepine interaction specifically carbamazepine decreasing efficacy of eliquis. Will defer any medication adjustment to neurology -Continue Eliquis -continue carbamazepine  #5.  Right knee injury.  Suffered a fall and fractured her right knee several weeks ago.  Evaluated by orthopedics at that time and she was deemed not a good candidate for surgery.  She has been nonweightbearing using a walker ever since. -Evaluated by PT who recommends SNF.  Family prefers to take patient home.  Have ordered home health  #6.  Chronic kidney disease stage III.  Creatinine is 1.6.  This appears to be slightly above her baseline. -Gentle IV fluids -Hold nephrotoxins -Monitor urine output -Recheck in the morning  7.  Hyponatremia.  Likely related to decreased oral intake in the setting of above.  Sodium level is 129 -Gentle IV fluids -Recheck in the morning -serum osmolality -urine osmolalty  Family Communication/Anticipated D/C date and plan/Code Status   DVT prophylaxis: Lovenox ordered. Code Status: Full Code.  Family Communication: left message daughter voice mail Disposition Plan: home hopefully tomorrow   Medical Consultants:    None.   Anti-Infectives:    None  Subjective:   Patient awake alert oriented to self and place.  Denies pain or discomfort except to right knee resting food and water.  Objective:    Vitals:   04/26/19 2306 04/27/19 0117 04/27/19 0311 04/27/19 1106  BP: (!) 134/56 Marland Kitchen)  125/58 (!) 120/44 (!) 138/49  Pulse: 76 62 69 64  Resp: 19 18 19 16   Temp: 97.9 F (36.6 C) 98.3 F (36.8 C) 98.5 F (36.9 C) (!) 97.5 F (36.4 C)  TempSrc: Oral Oral Oral Oral  SpO2: 99% 98% 96% 99%  Weight:  90.8 kg       Intake/Output Summary (Last 24 hours) at 04/27/2019 1355 Last data filed at 04/27/2019 0900 Gross per 24 hour  Intake 360 ml  Output 275 ml  Net 85 ml   Filed Weights   04/26/19 2306  Weight: 90.8 kg    Exam: General: Well-nourished slightly pale somewhat frail-appearing CV: Regular rate and rhythm no murmur gallop or rub no lower extremity edema Respiratory: No increased work of breathing breath sounds are distant but clear I hear no wheezes no crackles Abdomen: Nondistended soft positive bowel sounds throughout nontender to palpation no guarding or rebounding Musculoskeletal: Joints without swelling/erythema right knee is tender to palpation decreased range of motion due to pain Neuro: Awake alert oriented to self and place.  Speech clear facial symmetry  Data Reviewed:   I have personally reviewed following labs and imaging studies:  Labs: Labs show the following:   Basic Metabolic Panel: Recent Labs  Lab 04/26/19 1258 04/26/19 1305  NA 129* 129*  K 4.3 4.3  CL 94* 94*  CO2 22  --   GLUCOSE 106* 102*  BUN 25* 24*  CREATININE 1.25* 1.30*  CALCIUM 9.2  --    GFR Estimated Creatinine Clearance: 33.9 mL/min (A) (by C-G formula based on SCr of 1.3 mg/dL (H)). Liver Function Tests: Recent Labs  Lab 04/26/19 1258  AST 18  ALT 14  ALKPHOS 64  BILITOT 0.6  PROT 6.3*  ALBUMIN 3.5   No results for input(s): LIPASE, AMYLASE in the last 168 hours. No results for input(s): AMMONIA in the last 168 hours. Coagulation profile Recent Labs  Lab 04/26/19 1258  INR 1.4*    CBC: Recent Labs  Lab 04/26/19 1258 04/26/19 1305  WBC 7.1  --   NEUTROABS 4.8  --   HGB 9.2* 9.5*  HCT 28.6* 28.0*  MCV 82.9  --   PLT 153  --    Cardiac Enzymes: No results for input(s): CKTOTAL, CKMB, CKMBINDEX, TROPONINI in the last 168 hours. BNP (last 3 results) No results for input(s): PROBNP in the last 8760 hours. CBG: Recent Labs  Lab 04/27/19 0615  GLUCAP 89    D-Dimer: No results for input(s): DDIMER in the last 72 hours. Hgb A1c: Recent Labs    04/26/19 1650  HGBA1C 5.6   Lipid Profile: Recent Labs    04/27/19 0424  CHOL 127  HDL 59  LDLCALC 62  TRIG 32  CHOLHDL 2.2   Thyroid function studies: Recent Labs    04/26/19 1650  TSH 1.345   Anemia work up: Recent Labs    04/26/19 1752  FERRITIN 309*  TIBC 228*  IRON 47  RETICCTPCT 1.0   Sepsis Labs: Recent Labs  Lab 04/26/19 1258  WBC 7.1    Microbiology Recent Results (from the past 240 hour(s))  SARS CORONAVIRUS 2 (TAT 6-24 HRS) Nasopharyngeal Nasopharyngeal Swab     Status: None   Collection Time: 04/26/19  4:49 PM   Specimen: Nasopharyngeal Swab  Result Value Ref Range Status   SARS Coronavirus 2 NEGATIVE NEGATIVE Final    Comment: (NOTE) SARS-CoV-2 target nucleic acids are NOT DETECTED. The SARS-CoV-2 RNA is generally detectable in upper and  lower respiratory specimens during the acute phase of infection. Negative results do not preclude SARS-CoV-2 infection, do not rule out co-infections with other pathogens, and should not be used as the sole basis for treatment or other patient management decisions. Negative results must be combined with clinical observations, patient history, and epidemiological information. The expected result is Negative. Fact Sheet for Patients: SugarRoll.be Fact Sheet for Healthcare Providers: https://www.woods-mathews.com/ This test is not yet approved or cleared by the [place] FDA and  has been authorized for detection and/or diagnosis of SARS-CoV-2 by FDA under an Emergency Use Authorization (EUA). This EUA will remain  in effect (meaning this test can be used) for the duration of the COVID-19 declaration under Section 56 4(b)(1) of the Act, 21 U.S.C. section 360bbb-3(b)(1), unless the authorization is terminated or revoked sooner. Performed at [facility], [place]  [address], [place], [place] [address]     Procedures and diagnostic studies:  CT HEAD WO CONTRAST  Result Date: 04/26/2019 CLINICAL DATA:  Episode of right-sided weakness and facial droop EXAM: CT HEAD WITHOUT CONTRAST TECHNIQUE: Contiguous axial images were obtained from the base of the skull through the vertex without intravenous contrast. COMPARISON:  03/09/2019 FINDINGS: Brain: There is no acute intracranial hemorrhage, mass-effect, or edema. There is no new loss of gray-white differentiation. Small chronic infarcts are identified within the left cerebellum and left basal ganglia. Patchy and confluent areas of hypoattenuation in the supratentorial white matter are nonspecific but probably reflect stable moderate chronic microvascular ischemic changes. There is no extra-axial fluid collection. Ventricles and sulci are stable in size and configuration. Vascular: There is atherosclerotic calcification at the skull base. Skull: Calvarium is unremarkable. Sinuses/Orbits: No acute finding. Other: None. IMPRESSION: No acute intracranial abnormality. Stable chronic findings detailed above. Electronically Signed   By: Macy Mis M.D.   On: 04/26/2019 14:12   MR ANGIO HEAD WO CONTRAST  Result Date: 04/26/2019 CLINICAL DATA:  Right-sided weakness with facial droop and slurred speech EXAM: MRI HEAD WITHOUT CONTRAST MRA HEAD WITHOUT CONTRAST TECHNIQUE: Multiplanar, multiecho pulse sequences of the brain and surrounding structures were obtained without intravenous contrast. Angiographic images of the head were obtained using MRA technique without contrast. COMPARISON:  03/09/2019 FINDINGS: MRI HEAD FINDINGS BRAIN: There is no acute infarct, acute hemorrhage or extra-axial collection. Early confluent hyperintense T2-weighted signal of the periventricular and deep white matter, most commonly due to chronic ischemic microangiopathy. There is generalized atrophy without lobar predilection. Blood-sensitive sequences show  multiple chronic peripheral predominant microhemorrhages, unchanged. The midline structures are normal. Old cerebellar and basal ganglia small vessel infarcts. SKULL AND UPPER CERVICAL SPINE: The visualized skull base, calvarium, upper cervical spine and extracranial soft tissues are normal. SINUSES/ORBITS: No fluid levels or advanced mucosal thickening. No mastoid or middle ear effusion. The orbits are normal. MRA HEAD FINDINGS POSTERIOR CIRCULATION: --Basilar artery: Normal. --Posterior cerebral arteries: Normal. Both originate from the basilar artery. --Superior cerebellar arteries: Normal. --Inferior cerebellar arteries: Normal anterior and posterior inferior cerebellar arteries. ANTERIOR CIRCULATION: --Intracranial internal carotid arteries: Normal. --Anterior cerebral arteries: Normal. Both A1 segments are present. Patent anterior communicating artery. --Middle cerebral arteries: Normal. --Posterior communicating arteries: Present bilaterally. IMPRESSION: 1. No acute intracranial abnormality. Unchanged appearance of old cerebellar and basal ganglia small vessel infarcts. 2. Chronic ischemic microangiopathy, generalized volume loss and multiple chronic microhemorrhages, unchanged since 03/09/2019. 3. Normal intracranial MRA. Electronically Signed   By: Ulyses Jarred M.D.   On: 04/26/2019 19:16   MR BRAIN WO CONTRAST  Result  Date: 04/26/2019 CLINICAL DATA:  Right-sided weakness with facial droop and slurred speech EXAM: MRI HEAD WITHOUT CONTRAST MRA HEAD WITHOUT CONTRAST TECHNIQUE: Multiplanar, multiecho pulse sequences of the brain and surrounding structures were obtained without intravenous contrast. Angiographic images of the head were obtained using MRA technique without contrast. COMPARISON:  03/09/2019 FINDINGS: MRI HEAD FINDINGS BRAIN: There is no acute infarct, acute hemorrhage or extra-axial collection. Early confluent hyperintense T2-weighted signal of the periventricular and deep white matter, most  commonly due to chronic ischemic microangiopathy. There is generalized atrophy without lobar predilection. Blood-sensitive sequences show multiple chronic peripheral predominant microhemorrhages, unchanged. The midline structures are normal. Old cerebellar and basal ganglia small vessel infarcts. SKULL AND UPPER CERVICAL SPINE: The visualized skull base, calvarium, upper cervical spine and extracranial soft tissues are normal. SINUSES/ORBITS: No fluid levels or advanced mucosal thickening. No mastoid or middle ear effusion. The orbits are normal. MRA HEAD FINDINGS POSTERIOR CIRCULATION: --Basilar artery: Normal. --Posterior cerebral arteries: Normal. Both originate from the basilar artery. --Superior cerebellar arteries: Normal. --Inferior cerebellar arteries: Normal anterior and posterior inferior cerebellar arteries. ANTERIOR CIRCULATION: --Intracranial internal carotid arteries: Normal. --Anterior cerebral arteries: Normal. Both A1 segments are present. Patent anterior communicating artery. --Middle cerebral arteries: Normal. --Posterior communicating arteries: Present bilaterally. IMPRESSION: 1. No acute intracranial abnormality. Unchanged appearance of old cerebellar and basal ganglia small vessel infarcts. 2. Chronic ischemic microangiopathy, generalized volume loss and multiple chronic microhemorrhages, unchanged since 03/09/2019. 3. Normal intracranial MRA. Electronically Signed   By: Ulyses Jarred M.D.   On: 04/26/2019 19:16   DG Chest Port 1 View  Result Date: 04/26/2019 CLINICAL DATA:  Right-sided weakness, right-sided facial droop, slurred speech, atrial fibrillation EXAM: PORTABLE CHEST 1 VIEW COMPARISON:  04/03/2019 FINDINGS: The heart size and mediastinal contours are within normal limits. Both lungs are clear. The visualized skeletal structures are unremarkable. IMPRESSION: No active disease. Electronically Signed   By: Randa Ngo M.D.   On: 04/26/2019 13:10   ECHOCARDIOGRAM  COMPLETE  Result Date: 04/27/2019   ECHOCARDIOGRAM REPORT   Patient Name:   NAOMII BONIFACE Date of Exam: 04/27/2019 Medical Rec #:  [id number]       Height:       65.0 in Accession #:    [id number]      Weight:       200.2 lb Date of Birth:  February 17, 1930       BSA:          1.98 m Patient Age:    51 years        BP:           120/44 mmHg Patient Gender: F               HR:           69 bpm. Exam Location:  Inpatient Procedure: 2D Echo and Intracardiac Opacification Agent Indications:    Syncope 780.2/R55  History:        Patient has prior history of Echocardiogram examinations, most                 recent 03/08/2013. Arrythmias:Atrial Fibrillation; Risk                 Factors:Hypertension and Dyslipidemia. Bradycardia. H/o CVA.  Sonographer:    Clayton Lefort RDCS (AE) Referring Phys: [id number] [place]  1. Left ventricular ejection fraction, by visual estimation, is 60 to 65%. The left ventricle has normal function. Left ventricular septal wall thickness was mildly increased.  Mildly increased left ventricular posterior wall thickness. There is no left ventricular hypertrophy.  2. Definity contrast agent was given IV to delineate the left ventricular endocardial borders.  3. The left ventricle has no regional wall motion abnormalities.  4. Global right ventricle has normal systolic function.The right ventricular size is normal. No increase in right ventricular wall thickness.  5. Left atrial size was mild-moderately dilated.  6. Right atrial size was normal.  7. Mild to moderate mitral annular calcification.  8. The mitral valve is normal in structure. Mild mitral valve regurgitation. No evidence of mitral stenosis.  9. The tricuspid valve is normal in structure. Tricuspid valve regurgitation is trivial. 10. The aortic valve is normal in structure. Aortic valve regurgitation is mild. No evidence of aortic valve sclerosis or stenosis. 11. The pulmonic valve was normal in structure. Pulmonic valve  regurgitation is not visualized. 12. Normal pulmonary artery systolic pressure. 13. The inferior vena cava is normal in size with <50% respiratory variability, suggesting right atrial pressure of 8 mmHg. 14. The interatrial septum appears to be lipomatous. FINDINGS  Left Ventricle: Left ventricular ejection fraction, by visual estimation, is 60 to 65%. The left ventricle has normal function. Definity contrast agent was given IV to delineate the left ventricular endocardial borders. The left ventricle has no regional wall motion abnormalities. Mildly increased left ventricular posterior wall thickness. There is no left ventricular hypertrophy. Normal left atrial pressure. Right Ventricle: The right ventricular size is normal. No increase in right ventricular wall thickness. Global RV systolic function is has normal systolic function. The tricuspid regurgitant velocity is 2.25 m/s, and with an assumed right atrial pressure  of 8 mmHg, the estimated right ventricular systolic pressure is normal at 28.3 mmHg. Left Atrium: Left atrial size was mild-moderately dilated. Right Atrium: Right atrial size was normal in size Pericardium: There is no evidence of pericardial effusion. Mitral Valve: The mitral valve is normal in structure. Mild to moderate mitral annular calcification. Mild mitral valve regurgitation. No evidence of mitral valve stenosis by observation. MV peak gradient, 5.0 mmHg. Tricuspid Valve: The tricuspid valve is normal in structure. Tricuspid valve regurgitation is trivial. Aortic Valve: The aortic valve is normal in structure. Aortic valve regurgitation is mild. Aortic regurgitation PHT measures 789 msec. The aortic valve is structurally normal, with no evidence of sclerosis or stenosis. Aortic valve mean gradient measures  5.0 mmHg. Aortic valve peak gradient measures 8.9 mmHg. Aortic valve area, by VTI measures 2.80 cm. Pulmonic Valve: The pulmonic valve was normal in structure. Pulmonic valve  regurgitation is not visualized. Pulmonic regurgitation is not visualized. Aorta: The aortic root, ascending aorta and aortic arch are all structurally normal, with no evidence of dilitation or obstruction. Venous: The inferior vena cava is normal in size with less than 50% respiratory variability, suggesting right atrial pressure of 8 mmHg. IAS/Shunts: Increased thickness of the atrial septum sparing the fossa ovalis consistent with The interatrial septum appears to be lipomatous. No atrial level shunt detected by color flow Doppler. There is no evidence of a patent foramen ovale. No ventricular septal defect is seen or detected. There is no evidence of an atrial septal defect.  LEFT VENTRICLE PLAX 2D LVIDd:         4.15 cm LVIDs:         2.68 cm LV PW:         1.32 cm LV IVS:        1.29 cm LVOT diam:     2.10  cm LV SV:         50 ml LV SV Index:   24.02 LVOT Area:     3.46 cm  RIGHT VENTRICLE             IVC RV Basal diam:  3.68 cm     IVC diam: 2.08 cm RV Mid diam:    2.65 cm RV S prime:     16.60 cm/s TAPSE (M-mode): 3.2 cm LEFT ATRIUM             Index       RIGHT ATRIUM           Index LA diam:        3.90 cm 1.97 cm/m  RA Area:     17.00 cm LA Vol (A2C):   70.5 ml 35.63 ml/m RA Volume:   45.20 ml  22.84 ml/m LA Vol (A4C):   91.5 ml 46.24 ml/m LA Biplane Vol: 84.4 ml 42.65 ml/m  AORTIC VALVE AV Area (Vmax):    2.52 cm AV Area (Vmean):   2.43 cm AV Area (VTI):     2.80 cm AV Vmax:           149.00 cm/s AV Vmean:          101.000 cm/s AV VTI:            0.332 m AV Peak Grad:      8.9 mmHg AV Mean Grad:      5.0 mmHg LVOT Vmax:         108.40 cm/s LVOT Vmean:        70.920 cm/s LVOT VTI:          0.268 m LVOT/AV VTI ratio: 0.81 AI PHT:            789 msec  AORTA Ao Root diam: 2.90 cm MITRAL VALVE                         TRICUSPID VALVE MV Area (PHT): 2.91 cm              TR Peak grad:   20.3 mmHg MV Peak grad:  5.0 mmHg              TR Vmax:        232.00 cm/s MV Mean grad:  2.0 mmHg MV Vmax:        1.12 m/s              SHUNTS MV Vmean:      62.5 cm/s             Systemic VTI:  0.27 m MV VTI:        0.34 m                Systemic Diam: 2.10 cm MV PHT:        75.69 msec MV Decel Time: 261 msec MV E velocity: 118.00 cm/s 103 cm/s MV A velocity: 91.90 cm/s  70.3 cm/s MV E/A ratio:  1.28        1.5  Fransico Him MD Electronically signed by Fransico Him MD Signature Date/Time: 04/27/2019/10:41:50 AM    Final    VAS US CAROTID (at [facility] and [facility] only)  Result Date: 04/27/2019 Carotid Arterial Duplex Study Indications:       TIA. Risk Factors:      Hypertension, hyperlipidemia. Other Factors:     Dementia, A. fib. Comparison Study:  No  prior exam. Performing Technologist: Baldwin Crown ARDMS, RVT  Examination Guidelines: A complete evaluation includes B-mode imaging, spectral Doppler, color Doppler, and power Doppler as needed of all accessible portions of each vessel. Bilateral testing is considered an integral part of a complete examination. Limited examinations for reoccurring indications may be performed as noted.  Right Carotid Findings: +----------+--------+--------+--------+------------------+--------+           PSV cm/sEDV cm/sStenosisPlaque DescriptionComments +----------+--------+--------+--------+------------------+--------+ CCA Prox  157     19                                         +----------+--------+--------+--------+------------------+--------+ CCA Mid   136     19                                         +----------+--------+--------+--------+------------------+--------+ CCA Distal128     20                                         +----------+--------+--------+--------+------------------+--------+ ICA Prox  147     23      40-59%  heterogenous               +----------+--------+--------+--------+------------------+--------+ ICA Mid   137     24                                         +----------+--------+--------+--------+------------------+--------+ ICA  Distal135     26                                         +----------+--------+--------+--------+------------------+--------+ ECA       124                                                +----------+--------+--------+--------+------------------+--------+ +----------+--------+-------+----------------+-------------------+           PSV cm/sEDV cmsDescribe        Arm Pressure (mmHG) +----------+--------+-------+----------------+-------------------+ Subclavian218            Multiphasic, WNL                    +----------+--------+-------+----------------+-------------------+ +---------+--------+--+--------+--+---------+ VertebralPSV cm/s94EDV cm/s20Antegrade +---------+--------+--+--------+--+---------+  Left Carotid Findings: +----------+--------+--------+--------+------------------+--------+           PSV cm/sEDV cm/sStenosisPlaque DescriptionComments +----------+--------+--------+--------+------------------+--------+ CCA Prox  146     21                                         +----------+--------+--------+--------+------------------+--------+ CCA Distal124     15                                         +----------+--------+--------+--------+------------------+--------+ ICA Prox  123  20      1-39%   heterogenous               +----------+--------+--------+--------+------------------+--------+ ICA Distal114     21                                         +----------+--------+--------+--------+------------------+--------+ ECA       115     10                                         +----------+--------+--------+--------+------------------+--------+ +----------+--------+--------+----------------+-------------------+           PSV cm/sEDV cm/sDescribe        Arm Pressure (mmHG) +----------+--------+--------+----------------+-------------------+ [id number]     21      Multiphasic, WNL                     +----------+--------+--------+----------------+-------------------+ +---------+--------+--+--------+--+---------+ VertebralPSV cm/s58EDV cm/s10Antegrade +---------+--------+--+--------+--+---------+ Bilateral tortuous ICAs.  Summary: Right Carotid: Velocities in the right ICA are consistent with a 40-59%                stenosis. Left Carotid: Velocities in the left ICA are consistent with a 1-39% stenosis. Vertebrals:  Bilateral vertebral arteries demonstrate antegrade flow. Subclavians: Normal flow hemodynamics were seen in bilateral subclavian              arteries. *See table(s) above for measurements and observations.  Electronically signed by Antony Contras MD on 04/27/2019 at 1:06:21 PM.    Final     Medications:   .  stroke: mapping our early stages of recovery book   Does not apply Once  . apixaban  5 mg Oral BID  . carbamazepine  100 mg Oral BID  . cephALEXin  250 mg Oral Daily  . cholecalciferol  2,000 Units Oral Daily  . donepezil  10 mg Oral QHS  . fluticasone  1 spray Each Nare Daily  . folic acid  1 mg Oral Daily  . gabapentin  300 mg Oral BID  . levothyroxine  75 mcg Oral QAC breakfast  . loratadine  10 mg Oral Daily  . memantine  28 mg Oral Daily  . multivitamin with minerals  1 tablet Oral Daily  . Netarsudil-Latanoprost  1 drop Both Eyes QHS  . pravastatin  40 mg Oral QHS  . sodium chloride flush  3 mL Intravenous Q12H   Continuous Infusions:   LOS: 0 days   Radene Gunning NP [facility]   How to contact the [facility] Attending or Consulting provider [place] or covering provider during after hours [place], for this patient?  1. Check the care team in [facility] and look for a) attending/consulting TRH provider listed and b) the [facility] team listed 2. Log into [email or web address] and use [place]'s universal password to access. If you do not have the password, please contact the hospital operator. 3. Locate the [facility] provider you are looking for under [facility] and page  to a number that you can be directly reached. 4. If you still have difficulty reaching the provider, please page the [facility] (Director on Call) for the Hospitalists listed on amion for assistance.  04/27/2019, 1:55 PM

## 2019-04-27 NOTE — Care Management Obs Status (Signed)
[place] NOTIFICATION   Patient Details  Name: [name] MRN: [id number] Date of Birth: January 19, 1930   Medicare Observation Status Notification Given:  Yes    Pollie Friar, RN 04/27/2019, 1:52 PM

## 2019-04-27 NOTE — TOC Progression Note (Signed)
Transition of Care [facility]) - Progression Note    Patient Details  Name: [name] MRN: [id number] Date of Birth: 1930-03-23  [facility]) CM/SW Contact  Pollie Friar, RN Phone Number: 04/27/2019, 1:54 PM  Clinical Narrative:    CM spoke with daughter: [name] said she has space for requested bed. She has no preference for DME company. [employer] notified and will deliver bed and hoyer to the home tomorrow. Daughter does need bed in the home prior to patients return. Choice provided for [facility] services and [name] decided on. [name] with [name] accepted the referral. TOC following for further d/c needs.    Expected Discharge Plan: [place] Barriers to Discharge: Continued Medical Work up  Expected Discharge Plan and Services Expected Discharge Plan: [place] Choice: [place] arrangements for the past 2 months: [place]                 DME Arranged: Hospital bed(Hoyer Lift)                     Social Determinants of Health (SDOH) Interventions    Readmission Risk Interventions No flowsheet data found.

## 2019-04-27 NOTE — TOC Initial Note (Signed)
Transition of Care [facility]) - Initial/Assessment Note    Patient Details  Name: [name] MRN: [id number] Date of Birth: 1929/10/01  Transition of Care [facility]) CM/SW Contact:    Kirstie Peri, [place] Work Phone Number: 04/27/2019, 1:08 PM  Clinical Narrative:                 Msw intern spoke with Pt's daughter, Rush Landmark, [phone]). She confirmed that her mother had been at [facility] until Saturday, when pt moved in with her. She had also done a stint at [place], but did not like it. She noted that they had been using a wheelchair and a slide board, and the daughter was feeling overwhelmed with her mother's care, as they had not gotten any equipment. She is agreeable to her mother returning home as long as they get some HH and DME. She stated that there would be 24 hour supervision and assistance for the pt.. She asked for a hospital bed, as well as a hoyer lift, to assist with care. SW will continue to follow. Expected Discharge Plan: [place] Barriers to Discharge: Continued Medical Work up   Patient Goals and CMS Choice Patient states their goals for this hospitalization and ongoing recovery are:: Patient and Daughter would prefer patient return home with home health. CMS Medicare.gov Compare Post Acute Care list provided to:: Patient Represenative (must comment) Choice offered to / list presented to : Adult Children  Expected Discharge Plan and Services Expected Discharge Plan: [place] Choice: [place] arrangements for the past 2 months: [place]                 DME Arranged: Hospital bed(Hoyer Lift)                    Prior Living Arrangements/Services Living arrangements for the past 2 months: [place] Lives with:: Adult Children Patient language and need for interpreter reviewed:: Yes Do you feel safe going back to the place where you live?:  Yes      Need for Family Participation in Patient Care: Yes (Comment) Care giver support system in place?: Yes (comment) Current home services: DME(Has a wheelchair and sliding board) Criminal Activity/Legal Involvement Pertinent to Current Situation/Hospitalization: No - Comment as needed  Activities of Daily Living Home Assistive Devices/Equipment: Wheelchair ADL Screening (condition at time of admission) Patient's cognitive ability adequate to safely complete daily activities?: Yes Is the patient deaf or have difficulty hearing?: No Does the patient have difficulty seeing, even when wearing glasses/contacts?: No Does the patient have difficulty concentrating, remembering, or making decisions?: No Patient able to express need for assistance with ADLs?: Yes Does the patient have difficulty dressing or bathing?: No Independently performs ADLs?: Yes (appropriate for developmental age) Does the patient have difficulty walking or climbing stairs?: Yes Weakness of Legs: Both Weakness of Arms/Hands: None  Permission Sought/Granted Permission sought to share information with : Facility Art therapist granted to share information with : Yes, Verbal Permission Granted  Share Information with NAME: Rush Landmark     Permission granted to share info w Relationship: Daughter  Permission granted to share info w Contact Information: [phone]  Emotional Assessment Appearance:: Other (Comment Required(Unable to assess) Attitude/Demeanor/Rapport: Unable to Assess Affect (typically observed): Unable to Assess Orientation: : Oriented to Self, Oriented to Place, Oriented to  Time Alcohol / Substance Use: Not Applicable Psych  Involvement: No (comment)  Admission diagnosis:  Transient alteration of awareness [R40.4] TIA (transient ischemic attack) [G45.9] Patient Active Problem List   Diagnosis Date Noted  . Closed fracture of medial condyle of distal femur ([place]) 10/19/2018   . Cognitive impairment 10/31/2014  . TIA (transient ischemic attack) 12/03/2013  . UTI (lower urinary tract infection) 12/03/2013  . SIADH (syndrome of inappropriate ADH production) ([place]) 08/07/2013  . Back pain 08/03/2013  . Closed left arm fracture 08/03/2013  . Essential hypertension 04/07/2013  . PAF (paroxysmal atrial fibrillation) ([place]) 03/07/2013  . Chronic anticoagulation 03/07/2013  . Obesity, unspecified 03/07/2013  . Nausea 03/07/2013  . Hypothyroidism 03/07/2013  . Bradycardia 03/06/2013  . Dermatophytosis of nail 12/30/2012  . 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 Unspecified cerebral artery occlusion with cerebral infarction   . Unspecified deficiency anemia 10/21/2012  . Postherpetic trigeminal neuralgia 10/20/2012   PCP:  [name], MD Pharmacy:   [facility] [place], [place] - [address] [place] [address] Phone: [phone] Fax: [phone]     Social Determinants of Health (SDOH) Interventions    Readmission Risk Interventions No flowsheet data found.

## 2019-04-27 NOTE — Progress Notes (Signed)
  Echocardiogram 2D Echocardiogram has been performed with [facility].  [name] 04/27/2019, 8:47 AM

## 2019-04-27 NOTE — Progress Notes (Signed)
Carotid artery duplex exam completed.  Preliminary results can be found under CV proc under chart review.  04/27/2019 11:00 AM  Bryla Burek, K., RDMS, RVT

## 2019-04-27 NOTE — Progress Notes (Signed)
NEUROLOGY PROGRESS NOTE  Subjective: Patient has had no further episodes.  Talking to the daughter the word finding difficulties are not unusual for the patient.  Daughter states to me that at this point, because the patient is very much wheelchair-bound and bedbound, they are going to get her a hospital bed for home.  Exam: Vitals:   04/27/19 0311 04/27/19 1106  BP: (!) 120/44 (!) 138/49  Pulse: 69 64  Resp: 19 16  Temp: 98.5 F (36.9 C) (!) 97.5 F (36.4 C)  SpO2: 96% 99%    Physical Exam  Constitutional: Appears well-developed and well-nourished.  Eyes: No scleral injection HENT: No OP obstrucion Head: Normocephalic.  Cardiovascular: Palpable pulses.  Respiratory: Effort normal, non-labored breathing GI: Soft.  No distension. There is no tenderness.  Skin: WDI   Neuro:  Mental Status: Alert, with word finding difficulties which is her baseline.  Patient is able to name thumb, glove and tell me the color of my glove.  She is able to follow commands Cranial Nerves: II:  Visual fields grossly normal,  III,IV, VI: ptosis not present, extra-ocular motions intact bilaterally pupils equal, round, reactive to light and accommodation V,VII: smile symmetric, facial light touch sensation normal bilaterally VIII: hearing normal bilaterally IX,X: Palate rises midline Motor: Right : Upper extremity   5/5    Left:     Upper extremity   5/5  Lower extremity   3/5     Lower extremity   3/5 Tone and bulk:normal tone throughout; no atrophy noted Sensory: Pinprick and light touch intact throughout, bilaterally   Medications:  Scheduled: .  stroke: mapping our early stages of recovery book   Does not apply Once  . apixaban  5 mg Oral BID  . carbamazepine  100 mg Oral BID  . cephALEXin  250 mg Oral Daily  . cholecalciferol  2,000 Units Oral Daily  . donepezil  10 mg Oral QHS  . fluticasone  1 spray Each Nare Daily  . folic acid  1 mg Oral Daily  . gabapentin  300 mg Oral BID  .  levothyroxine  75 mcg Oral QAC breakfast  . loratadine  10 mg Oral Daily  . memantine  28 mg Oral Daily  . multivitamin with minerals  1 tablet Oral Daily  . Netarsudil-Latanoprost  1 drop Both Eyes QHS  . pravastatin  40 mg Oral QHS  . sodium chloride flush  3 mL Intravenous Q12H    Pertinent Labs/Diagnostics: HbA1c 5.6 LDL 62 Orthostatics negative  CT HEAD WO CONTRAST Result Date: 04/26/2019 . IMPRESSION: No acute intracranial abnormality. Stable chronic findings detailed above. Electronically Signed   By: Macy Mis M.D.   On: 04/26/2019 14:12   MR BRAIN WO CONTRAST/ MR ANGIO HEAD WO CONTRAST Result Date: 04/26/2019  IMPRESSION: 1. No acute intracranial abnormality. Unchanged appearance of old cerebellar and basal ganglia small vessel infarcts. 2. Chronic ischemic microangiopathy, generalized volume loss and multiple chronic microhemorrhages, unchanged since 03/09/2019. 3. Normal intracranial MRA. Electronically Signed   By: Ulyses Jarred M.D.   On: 04/26/2019 19:16    ECHOCARDIOGRAM COMPLETE Result Date: 04/27/2019 IMPRESSIONS  1. Left ventricular ejection fraction, by visual estimation, is 60 to 65%.    VAS US CAROTID Result Date: 04/27/2019   Summary: Right Carotid: Velocities in the right ICA are consistent with a 40-59%   stenosis. Left Carotid: Velocities in the left ICA are consistent with a 1-39% stenosis. Vertebrals:  Bilateral vertebral arteries demonstrate antegrade flow. Subclavians: Normal  flow hemodynamics were seen in bilateral subclavian              arteries. *See table(s) above for measurements and observations.  Electronically signed by Antony Contras MD on 04/27/2019 at 1:06:21 PM.    Final     Etta Quill PA-C [facility] [phone]  Assessment/Recommendations:  86 year old female with dementia, presenting with transient loss of consciousness after being transferred from bed to wheelchair.   1. Thus far 1 set of orthostatics did not show any  abnormalities.   2. MRI and MRA showed no acute abnormalities.   3. Echocardiogram showed EF of 60 to 65%.   4. Carotid ultrasound: Patient has some stenosis of 40-59% in the right ICA and left ICA of 1-39%.  5. The most likely explanation for her presentation is a vagal episode from pain and/or orthostatic syncopal episode during transfer. 5. Neurology team has discussed with daughter that TED hose may be helpful.  Daughter states that apparently TED hose causes irritation.  May be amenable to medication adjustments versus new medication to decrease the likelihood of further syncopal episodes. 6. Neurology team has discussed the above with Dr. Eliseo Squires, the patient's Hospitalist MD. Neurology will sign off. Please call if there are additional questions.   Electronically signed: Dr. Kerney Elbe 04/27/2019, 1:32 PM

## 2019-04-28 ENCOUNTER — Other Ambulatory Visit: Payer: Self-pay | Admitting: Neurology

## 2019-04-28 DIAGNOSIS — Z87440 Personal history of urinary (tract) infections: Secondary | ICD-10-CM

## 2019-04-28 DIAGNOSIS — D638 Anemia in other chronic diseases classified elsewhere: Secondary | ICD-10-CM

## 2019-04-28 DIAGNOSIS — I10 Essential (primary) hypertension: Secondary | ICD-10-CM

## 2019-04-28 DIAGNOSIS — E871 Hypo-osmolality and hyponatremia: Secondary | ICD-10-CM

## 2019-04-28 DIAGNOSIS — R4020 Unspecified coma: Secondary | ICD-10-CM

## 2019-04-28 DIAGNOSIS — R5381 Other malaise: Secondary | ICD-10-CM

## 2019-04-28 DIAGNOSIS — I480 Paroxysmal atrial fibrillation: Secondary | ICD-10-CM

## 2019-04-28 DIAGNOSIS — N1832 Chronic kidney disease, stage 3b: Secondary | ICD-10-CM

## 2019-04-28 LAB — BASIC METABOLIC PANEL
Anion gap: 11 (ref 5–15)
BUN: 29 mg/dL — ABNORMAL HIGH (ref 8–23)
CO2: 24 mmol/L (ref 22–32)
Calcium: 9 mg/dL (ref 8.9–10.3)
Chloride: 98 mmol/L (ref 98–111)
Creatinine, Ser: 1.25 mg/dL — ABNORMAL HIGH (ref 0.44–1.00)
GFR calc Af Amer: 45 mL/min — ABNORMAL LOW (ref >60)
GFR calc non Af Amer: 39 mL/min — ABNORMAL LOW (ref >60)
Glucose, Bld: 91 mg/dL (ref 70–99)
Potassium: 4.1 mmol/L (ref 3.5–5.1)
Sodium: 133 mmol/L — ABNORMAL LOW (ref 135–145)

## 2019-04-28 LAB — GLUCOSE, CAPILLARY: Glucose-Capillary: 92 mg/dL (ref 70–99)

## 2019-04-28 MED ORDER — ACETAMINOPHEN 500 MG PO TABS: 1000.0000 mg | ORAL_TABLET | Freq: Three times a day (TID) | ORAL | 1 refills | Status: AC

## 2019-04-28 NOTE — Discharge Instructions (Signed)

## 2019-04-28 NOTE — TOC Transition Note (Signed)
Transition of Care [facility]) - CM/SW Discharge Note   Patient Details  Name: [name] MRN: [id number] Date of Birth: September 18, 1929  [facility]) CM/SW Contact:  Pollie Friar, RN Phone Number: 04/28/2019, 10:32 AM   Clinical Narrative:    Pt discharging home to daughters. CM spoke to daughter and DME has been delivered to the home.  PTAR for transport home per daughter. Report called to PTAR. D/c packet at the desk and bedside RN updated. Daughter aware of timing.    Final next level of care: Home w Home Health Services Barriers to Discharge: No Barriers Identified   Patient Goals and CMS Choice Patient states their goals for this hospitalization and ongoing recovery are:: Patient and Daughter would prefer patient return home with home health. CMS Medicare.gov Compare Post Acute Care list provided to:: Patient Represenative (must comment) Choice offered to / list presented to : Adult Children  Discharge Placement                       Discharge Plan and Services     Post Acute Care Choice: Home Health          DME Arranged: Hospital bed(Hoyer Lift) DME Agency: [employer] Date DME Agency Contacted: 04/27/19   Representative spoke with at DME Agency: zack HH Arranged: PT, OT, Nurse's Aide, Social Work [employer] Agency: [place] Date [place]: 04/28/19   Representative spoke with at [place]: cory  Social Determinants of Health ([place]) Interventions     Readmission Risk Interventions No flowsheet data found.

## 2019-04-28 NOTE — Discharge Summary (Signed)
Physician Discharge Summary  [name] [id number] DOB: 08/06/1929 DOA: 04/26/2019  PCP: [name], MD  Admit date: 04/26/2019 Discharge date: 04/28/2019  Admitted From: Home Disposition: Home  Recommendations for Outpatient Follow-up:  1. Follow ups as below. 2. Please obtain CBC/BMP/Mag at follow up 3. Please follow up on the following pending results: None  Home Health: PT/OT/aide Equipment/Devices: Hospital bed and [facility] lift  Discharge Condition: Stable CODE STATUS: Full code  Follow-up Information    [name], MD. Schedule an appointment as soon as possible for a visit in 1 week(s).   Specialty: Family Medicine Contact information: [address] [place] [address] [phone]           Hospital Course: 86 year old female with history of trigeminal neuralgia, migraines, dementia, junctional bradycardia, HTN, dyslipidemia, CVA in 1964, paroxysmal A. fib on Eliquis presented with LOC episode concerning for TIA versus syncope versus seizure.   She was admitted for TIA work-up.  MRI brain, TTE and bilateral CUS without significant finding to explain patient's symptoms.  UDS positive for opiates.  A1c and TSH within normal range.  Evaluated by neurology who felt patient symptoms to be syncopal episode related to vagal event.  She was evaluated by PT/OT who recommended SNF but family declined SNF and opted to take patient home with home health and DME as above.  See individual problems below for more hospital course.  Discharge Diagnoses:  Loss of consciousness: Likely syncope.  Could be iatrogenic from opiates and other sedating medications.  CVA work-up negative as above. -Discontinued opiates and meclizine -Schedule Tylenol for pain  History of recurrent UTI: On prophylactic Keflex-we will continue.  Essential hypertension: Normotensive -Continue home meds.  CKD-3b: Stable.  Chronic hyponatremia: Improved.  Likely due to  carbamazepine. -Recheck BMP at follow-up -Continue home p.o. sodium chloride.  Anemia of chronic disease: Stable.  Debility/physical deconditioning: -Home health and DME as above.  Discharge Instructions  Discharge Instructions    Call MD for:  difficulty breathing, headache or visual disturbances   Complete by: As directed    Call MD for:  extreme fatigue   Complete by: As directed    Call MD for:  persistant dizziness or light-headedness   Complete by: As directed    Call MD for:  persistant nausea and vomiting   Complete by: As directed    Call MD for:  severe uncontrolled pain   Complete by: As directed    Call MD for:  temperature >100.4   Complete by: As directed    Diet general   Complete by: As directed    Discharge instructions   Complete by: As directed    It has been a pleasure taking care of you! You were hospitalized after concern for syncope (passing out) at home.  It is unclear what caused your symptoms.  However, you could have orthostatic hypotension (drop in blood pressure when you get up).  Some of your home medications such as Norco, Tylenol 3, gabapentin and meclizine could contribute to the symptoms.  We have made an adjustment to some of your home medications to reduce your risk of future fall. Please review your new medication list and the directions before you take your medications.   Please follow-up with your primary care doctor in 1 to 2 weeks.   Take care,   Increase activity slowly   Complete by: As directed      Allergies as of 04/28/2019  Reactions   Benadryl [diphenhydramine Hcl] Hives, Shortness Of Breath   Eye Drops [tetrahydrozoline Hcl] Other (See Comments)   Burning, itching, swelling   Flu Virus Vaccine Shortness Of Breath   Iohexol Hives, Shortness Of Breath    Code: HIVES, Desc: PT IS ALLERGIC TO IVP DYE/IODINE/SHRIMP.  CAUSES SOB AND HIVES PER PT.  STEPHANIE, RT-R, Onset Date: [id number]   Shrimp [shellfish Allergy] Anaphylaxis     Voltaren [diclofenac Sodium] Other (See Comments)   Acid reflux symptoms   Ambien [zolpidem Tartrate] Hives   Gluten Meal Diarrhea   Travatan Z [travoprost] Other (See Comments)   BRAND NAME ONLY OLCANNOT TOLERATE GENERIC FORM OF EYE DROPS, IT CONTAINS PRESERVATIVES-SENSITIVITY   Ativan [lorazepam]    delusional   Hydrocodone    Only on the higher dosages   Betadine Antibiotic-moisturize [bacitracin-polymyxin B] Rash   Biaxin [clarithromycin] Hives   Celebrex [celecoxib] Rash   Ciprofloxacin Hcl Rash      Combigan [brimonidine Tartrate-timolol] Other (See Comments)   unknown   Coumadin [warfarin Sodium] Rash   Ivp Dye [iodinated Diagnostic Agents] Hives   Levaquin [levofloxacin In D5w] Rash   Naprosyn [naproxen] Rash   Pataday [olopatadine Hcl] Other (See Comments)   Penicillins Hives   Has patient had a PCN reaction causing immediate rash, facial/tongue/throat swelling, SOB or lightheadedness with hypotension: Yes Has patient had a PCN reaction causing severe rash involving mucus membranes or skin necrosis: No Has patient had a PCN reaction that required hospitalization No Has patient had a PCN reaction occurring within the last 10 years: No If all of the above answers are "NO", then may proceed with Cephalosporin use.   Plavix [clopidogrel Bisulfate] Rash   Septra [sulfamethoxazole-trimethoprim] Hives      Medication List    STOP taking these medications   acetaminophen-codeine 300-30 MG tablet Commonly known as: TYLENOL #3   amLODipine 2.5 MG tablet Commonly known as: NORVASC   fosfomycin 3 g Pack Commonly known as: MONUROL   HYDROcodone-acetaminophen 5-325 MG tablet Commonly known as: NORCO/VICODIN   meclizine 25 MG tablet Commonly known as: ANTIVERT     TAKE these medications   acetaminophen 500 MG tablet Commonly known as: TYLENOL Take 2 tablets (1,000 mg total) by mouth every 8 (eight) hours.   carbamazepine 100 MG 12 hr capsule Commonly known as:  Carbatrol TAKE 2 CAPSULES BY MOUTH 2 TIMES DAILY What changed:   how much to take  how to take this  when to take this  additional instructions   cephALEXin 250 MG capsule Commonly known as: KEFLEX Take 250 mg by mouth daily.   cholecalciferol 1000 units tablet Commonly known as: VITAMIN D Take 2,000 Units by mouth daily.   Eliquis 5 MG Tabs tablet Generic drug: apixaban Take 5 mg by mouth 2 (two) times daily.   EpiPen 2-Pak 0.3 mg/0.3 mL Soaj injection Generic drug: EPINEPHrine Inject 0.3 mg into the muscle as needed for anaphylaxis.   feeding supplement (ENSURE ENLIVE) Liqd Take 237 mLs by mouth daily at 3 pm.   fluticasone 50 MCG/ACT nasal spray Commonly known as: FLONASE Place 1 spray into left nostril daily as needed for allergies or rhinitis.   folic acid 1 MG tablet Commonly known as: FOLVITE Take 1 mg by mouth daily.   gabapentin 300 MG capsule Commonly known as: NEURONTIN Take 1 capsule (300 mg total) by mouth 2 (two) times daily.   levothyroxine 75 MCG tablet Commonly known as: SYNTHROID Take 75 mcg by mouth daily  before breakfast.   loperamide 2 MG tablet Commonly known as: IMODIUM A-D Take 2 mg by mouth 4 (four) times daily as needed for diarrhea or loose stools.   loratadine 10 MG tablet Commonly known as: CLARITIN Take 10 mg by mouth daily.   multivitamin with minerals Tabs tablet Take 1 tablet by mouth daily.   Namzaric 28-10 MG Cp24 Generic drug: Memantine HCl-Donepezil HCl Take 1 capsule by mouth daily. What changed: when to take this   Pravachol 40 MG tablet Generic drug: pravastatin Take 40 mg by mouth at bedtime.   Rocklatan 0.02-0.005 % Soln Generic drug: Netarsudil-Latanoprost Place 1 drop into both eyes at bedtime.   sodium chloride 1 g tablet Take 1 tablet (1 g total) by mouth 2 (two) times daily with a meal.            Durable Medical Equipment  (From admission, onward)         Start     Ordered   04/27/19  1315  For home use only DME Hospital bed  Once    Question Answer Comment  Length of Need 12 Months   Bed type Semi-electric   [facility]oyer Lift Yes      04/27/19 1314   04/27/19 1304  For home use only DME Other see comment  Once    Comments: [facility] lift  Question:  Length of Need  Answer:  6 Months   04/27/19 1303          Consultations:  Neurology  Procedures/Studies:  2D Echo  1. Left ventricular ejection fraction, by visual estimation, is 60 to  65%. The left ventricle has normal function. Left ventricular septal wall  thickness was mildly increased. Mildly increased left ventricular  posterior wall thickness. There is no left  ventricular hypertrophy.  2. Definity contrast agent was given IV to delineate the left ventricular  endocardial borders.  3. The left ventricle has no regional wall motion abnormalities.  4. Global right ventricle has normal systolic function.The right  ventricular size is normal. No increase in right ventricular wall  thickness.  5. Left atrial size was mild-moderately dilated.  6. Right atrial size was normal.  7. Mild to moderate mitral annular calcification.  8. The mitral valve is normal in structure. Mild mitral valve  regurgitation. No evidence of mitral stenosis.  9. The tricuspid valve is normal in structure. Tricuspid valve  regurgitation is trivial.  10. The aortic valve is normal in structure. Aortic valve regurgitation is  mild. No evidence of aortic valve sclerosis or stenosis.  11. The pulmonic valve was normal in structure. Pulmonic valve  regurgitation is not visualized.  12. Normal pulmonary artery systolic pressure.  13. The inferior vena cava is normal in size with <50% respiratory  variability, suggesting right atrial pressure of 8 mmHg.  14. The interatrial septum appears to be lipomatous.   CT HEAD WO CONTRAST  Result Date: 04/26/2019 CLINICAL DATA:  Episode of right-sided weakness and facial droop EXAM: CT HEAD  WITHOUT CONTRAST TECHNIQUE: Contiguous axial images were obtained from the base of the skull through the vertex without intravenous contrast. COMPARISON:  03/09/2019 FINDINGS: Brain: There is no acute intracranial hemorrhage, mass-effect, or edema. There is no new loss of gray-white differentiation. Small chronic infarcts are identified within the left cerebellum and left basal ganglia. Patchy and confluent areas of hypoattenuation in the supratentorial white matter are nonspecific but probably reflect stable moderate chronic microvascular ischemic changes. There is no extra-axial fluid collection.  Ventricles and sulci are stable in size and configuration. Vascular: There is atherosclerotic calcification at the skull base. Skull: Calvarium is unremarkable. Sinuses/Orbits: No acute finding. Other: None. IMPRESSION: No acute intracranial abnormality. Stable chronic findings detailed above. Electronically Signed   By: Macy Mis M.D.   On: 04/26/2019 14:12   MR ANGIO HEAD WO CONTRAST  Result Date: 04/26/2019 CLINICAL DATA:  Right-sided weakness with facial droop and slurred speech EXAM: MRI HEAD WITHOUT CONTRAST MRA HEAD WITHOUT CONTRAST TECHNIQUE: Multiplanar, multiecho pulse sequences of the brain and surrounding structures were obtained without intravenous contrast. Angiographic images of the head were obtained using MRA technique without contrast. COMPARISON:  03/09/2019 FINDINGS: MRI HEAD FINDINGS BRAIN: There is no acute infarct, acute hemorrhage or extra-axial collection. Early confluent hyperintense T2-weighted signal of the periventricular and deep white matter, most commonly due to chronic ischemic microangiopathy. There is generalized atrophy without lobar predilection. Blood-sensitive sequences show multiple chronic peripheral predominant microhemorrhages, unchanged. The midline structures are normal. Old cerebellar and basal ganglia small vessel infarcts. SKULL AND UPPER CERVICAL SPINE: The  visualized skull base, calvarium, upper cervical spine and extracranial soft tissues are normal. SINUSES/ORBITS: No fluid levels or advanced mucosal thickening. No mastoid or middle ear effusion. The orbits are normal. MRA HEAD FINDINGS POSTERIOR CIRCULATION: --Basilar artery: Normal. --Posterior cerebral arteries: Normal. Both originate from the basilar artery. --Superior cerebellar arteries: Normal. --Inferior cerebellar arteries: Normal anterior and posterior inferior cerebellar arteries. ANTERIOR CIRCULATION: --Intracranial internal carotid arteries: Normal. --Anterior cerebral arteries: Normal. Both A1 segments are present. Patent anterior communicating artery. --Middle cerebral arteries: Normal. --Posterior communicating arteries: Present bilaterally. IMPRESSION: 1. No acute intracranial abnormality. Unchanged appearance of old cerebellar and basal ganglia small vessel infarcts. 2. Chronic ischemic microangiopathy, generalized volume loss and multiple chronic microhemorrhages, unchanged since 03/09/2019. 3. Normal intracranial MRA. Electronically Signed   By: Ulyses Jarred M.D.   On: 04/26/2019 19:16   MR BRAIN WO CONTRAST  Result Date: 04/26/2019 CLINICAL DATA:  Right-sided weakness with facial droop and slurred speech EXAM: MRI HEAD WITHOUT CONTRAST MRA HEAD WITHOUT CONTRAST TECHNIQUE: Multiplanar, multiecho pulse sequences of the brain and surrounding structures were obtained without intravenous contrast. Angiographic images of the head were obtained using MRA technique without contrast. COMPARISON:  03/09/2019 FINDINGS: MRI HEAD FINDINGS BRAIN: There is no acute infarct, acute hemorrhage or extra-axial collection. Early confluent hyperintense T2-weighted signal of the periventricular and deep white matter, most commonly due to chronic ischemic microangiopathy. There is generalized atrophy without lobar predilection. Blood-sensitive sequences show multiple chronic peripheral predominant microhemorrhages,  unchanged. The midline structures are normal. Old cerebellar and basal ganglia small vessel infarcts. SKULL AND UPPER CERVICAL SPINE: The visualized skull base, calvarium, upper cervical spine and extracranial soft tissues are normal. SINUSES/ORBITS: No fluid levels or advanced mucosal thickening. No mastoid or middle ear effusion. The orbits are normal. MRA HEAD FINDINGS POSTERIOR CIRCULATION: --Basilar artery: Normal. --Posterior cerebral arteries: Normal. Both originate from the basilar artery. --Superior cerebellar arteries: Normal. --Inferior cerebellar arteries: Normal anterior and posterior inferior cerebellar arteries. ANTERIOR CIRCULATION: --Intracranial internal carotid arteries: Normal. --Anterior cerebral arteries: Normal. Both A1 segments are present. Patent anterior communicating artery. --Middle cerebral arteries: Normal. --Posterior communicating arteries: Present bilaterally. IMPRESSION: 1. No acute intracranial abnormality. Unchanged appearance of old cerebellar and basal ganglia small vessel infarcts. 2. Chronic ischemic microangiopathy, generalized volume loss and multiple chronic microhemorrhages, unchanged since 03/09/2019. 3. Normal intracranial MRA. Electronically Signed   By: Ulyses Jarred M.D.   On: 04/26/2019 19:16   DG  Chest Port 1 View  Result Date: 04/26/2019 CLINICAL DATA:  Right-sided weakness, right-sided facial droop, slurred speech, atrial fibrillation EXAM: PORTABLE CHEST 1 VIEW COMPARISON:  04/03/2019 FINDINGS: The heart size and mediastinal contours are within normal limits. Both lungs are clear. The visualized skeletal structures are unremarkable. IMPRESSION: No active disease. Electronically Signed   By: Randa Ngo M.D.   On: 04/26/2019 13:10   DG Chest Port 1 View  Result Date: 04/03/2019 CLINICAL DATA:  Syncope. EXAM: PORTABLE CHEST 1 VIEW COMPARISON:  12/22/2018 FINDINGS: The lung apices are poorly evaluated secondary to overlapping structures. The heart size is  stable from prior study. There is no focal infiltrate. No large pleural effusion. No pneumothorax. No acute osseous abnormality. IMPRESSION: No active disease. Electronically Signed   By: Constance Holster M.D.   On: 04/03/2019 20:13   ECHOCARDIOGRAM COMPLETE  Result Date: 04/27/2019   ECHOCARDIOGRAM REPORT   Patient Name:   RAYLYNN MEUSER Date of Exam: 04/27/2019 Medical Rec #:  [id number]       Height:       65.0 in Accession #:    [id number]      Weight:       200.2 lb Date of Birth:  07-01-1929       BSA:          1.98 m Patient Age:    54 years        BP:           120/44 mmHg Patient Gender: F               HR:           69 bpm. Exam Location:  Inpatient Procedure: 2D Echo and Intracardiac Opacification Agent Indications:    Syncope 780.2/R55  History:        Patient has prior history of Echocardiogram examinations, most                 recent 03/08/2013. Arrythmias:Atrial Fibrillation; Risk                 Factors:Hypertension and Dyslipidemia. Bradycardia. H/o CVA.  Sonographer:    Clayton Lefort RDCS (AE) Referring Phys: [id number] [place]  1. Left ventricular ejection fraction, by visual estimation, is 60 to 65%. The left ventricle has normal function. Left ventricular septal wall thickness was mildly increased. Mildly increased left ventricular posterior wall thickness. There is no left ventricular hypertrophy.  2. Definity contrast agent was given IV to delineate the left ventricular endocardial borders.  3. The left ventricle has no regional wall motion abnormalities.  4. Global right ventricle has normal systolic function.The right ventricular size is normal. No increase in right ventricular wall thickness.  5. Left atrial size was mild-moderately dilated.  6. Right atrial size was normal.  7. Mild to moderate mitral annular calcification.  8. The mitral valve is normal in structure. Mild mitral valve regurgitation. No evidence of mitral stenosis.  9. The tricuspid valve is normal in  structure. Tricuspid valve regurgitation is trivial. 10. The aortic valve is normal in structure. Aortic valve regurgitation is mild. No evidence of aortic valve sclerosis or stenosis. 11. The pulmonic valve was normal in structure. Pulmonic valve regurgitation is not visualized. 12. Normal pulmonary artery systolic pressure. 13. The inferior vena cava is normal in size with <50% respiratory variability, suggesting right atrial pressure of 8 mmHg. 14. The interatrial septum appears to be lipomatous. FINDINGS  Left Ventricle: Left ventricular ejection  fraction, by visual estimation, is 60 to 65%. The left ventricle has normal function. Definity contrast agent was given IV to delineate the left ventricular endocardial borders. The left ventricle has no regional wall motion abnormalities. Mildly increased left ventricular posterior wall thickness. There is no left ventricular hypertrophy. Normal left atrial pressure. Right Ventricle: The right ventricular size is normal. No increase in right ventricular wall thickness. Global RV systolic function is has normal systolic function. The tricuspid regurgitant velocity is 2.25 m/s, and with an assumed right atrial pressure  of 8 mmHg, the estimated right ventricular systolic pressure is normal at 28.3 mmHg. Left Atrium: Left atrial size was mild-moderately dilated. Right Atrium: Right atrial size was normal in size Pericardium: There is no evidence of pericardial effusion. Mitral Valve: The mitral valve is normal in structure. Mild to moderate mitral annular calcification. Mild mitral valve regurgitation. No evidence of mitral valve stenosis by observation. MV peak gradient, 5.0 mmHg. Tricuspid Valve: The tricuspid valve is normal in structure. Tricuspid valve regurgitation is trivial. Aortic Valve: The aortic valve is normal in structure. Aortic valve regurgitation is mild. Aortic regurgitation PHT measures 789 msec. The aortic valve is structurally normal, with no evidence  of sclerosis or stenosis. Aortic valve mean gradient measures  5.0 mmHg. Aortic valve peak gradient measures 8.9 mmHg. Aortic valve area, by VTI measures 2.80 cm. Pulmonic Valve: The pulmonic valve was normal in structure. Pulmonic valve regurgitation is not visualized. Pulmonic regurgitation is not visualized. Aorta: The aortic root, ascending aorta and aortic arch are all structurally normal, with no evidence of dilitation or obstruction. Venous: The inferior vena cava is normal in size with less than 50% respiratory variability, suggesting right atrial pressure of 8 mmHg. IAS/Shunts: Increased thickness of the atrial septum sparing the fossa ovalis consistent with The interatrial septum appears to be lipomatous. No atrial level shunt detected by color flow Doppler. There is no evidence of a patent foramen ovale. No ventricular septal defect is seen or detected. There is no evidence of an atrial septal defect.  LEFT VENTRICLE PLAX 2D LVIDd:         4.15 cm LVIDs:         2.68 cm LV PW:         1.32 cm LV IVS:        1.29 cm LVOT diam:     2.10 cm LV SV:         50 ml LV SV Index:   24.02 LVOT Area:     3.46 cm  RIGHT VENTRICLE             IVC RV Basal diam:  3.68 cm     IVC diam: 2.08 cm RV Mid diam:    2.65 cm RV S prime:     16.60 cm/s TAPSE (M-mode): 3.2 cm LEFT ATRIUM             Index       RIGHT ATRIUM           Index LA diam:        3.90 cm 1.97 cm/m  RA Area:     17.00 cm LA Vol (A2C):   70.5 ml 35.63 ml/m RA Volume:   45.20 ml  22.84 ml/m LA Vol (A4C):   91.5 ml 46.24 ml/m LA Biplane Vol: 84.4 ml 42.65 ml/m  AORTIC VALVE AV Area (Vmax):    2.52 cm AV Area (Vmean):   2.43 cm AV Area (VTI):  2.80 cm AV Vmax:           149.00 cm/s AV Vmean:          101.000 cm/s AV VTI:            0.332 m AV Peak Grad:      8.9 mmHg AV Mean Grad:      5.0 mmHg LVOT Vmax:         108.40 cm/s LVOT Vmean:        70.920 cm/s LVOT VTI:          0.268 m LVOT/AV VTI ratio: 0.81 AI PHT:            789 msec  AORTA Ao  Root diam: 2.90 cm MITRAL VALVE                         TRICUSPID VALVE MV Area (PHT): 2.91 cm              TR Peak grad:   20.3 mmHg MV Peak grad:  5.0 mmHg              TR Vmax:        232.00 cm/s MV Mean grad:  2.0 mmHg MV Vmax:       1.12 m/s              SHUNTS MV Vmean:      62.5 cm/s             Systemic VTI:  0.27 m MV VTI:        0.34 m                Systemic Diam: 2.10 cm MV PHT:        75.69 msec MV Decel Time: 261 msec MV E velocity: 118.00 cm/s 103 cm/s MV A velocity: 91.90 cm/s  70.3 cm/s MV E/A ratio:  1.28        1.5  Fransico Him MD Electronically signed by Fransico Him MD Signature Date/Time: 04/27/2019/10:41:50 AM    Final    VAS US CAROTID (at [facility] and [facility] only)  Result Date: 04/27/2019 Carotid Arterial Duplex Study Indications:       TIA. Risk Factors:      Hypertension, hyperlipidemia. Other Factors:     Dementia, A. fib. Comparison Study:  No prior exam. Performing Technologist: Baldwin Crown ARDMS, RVT  Examination Guidelines: A complete evaluation includes B-mode imaging, spectral Doppler, color Doppler, and power Doppler as needed of all accessible portions of each vessel. Bilateral testing is considered an integral part of a complete examination. Limited examinations for reoccurring indications may be performed as noted.  Right Carotid Findings: +----------+--------+--------+--------+------------------+--------+           PSV cm/sEDV cm/sStenosisPlaque DescriptionComments +----------+--------+--------+--------+------------------+--------+ CCA Prox  157     19                                         +----------+--------+--------+--------+------------------+--------+ CCA Mid   136     19                                         +----------+--------+--------+--------+------------------+--------+ CCA Distal128     20                                         +----------+--------+--------+--------+------------------+--------+  ICA Prox  147     23      40-59%   heterogenous               +----------+--------+--------+--------+------------------+--------+ ICA Mid   137     24                                         +----------+--------+--------+--------+------------------+--------+ ICA Distal135     26                                         +----------+--------+--------+--------+------------------+--------+ ECA       124                                                +----------+--------+--------+--------+------------------+--------+ +----------+--------+-------+----------------+-------------------+           PSV cm/sEDV cmsDescribe        Arm Pressure (mmHG) +----------+--------+-------+----------------+-------------------+ Subclavian218            Multiphasic, WNL                    +----------+--------+-------+----------------+-------------------+ +---------+--------+--+--------+--+---------+ VertebralPSV cm/s94EDV cm/s20Antegrade +---------+--------+--+--------+--+---------+  Left Carotid Findings: +----------+--------+--------+--------+------------------+--------+           PSV cm/sEDV cm/sStenosisPlaque DescriptionComments +----------+--------+--------+--------+------------------+--------+ CCA Prox  146     21                                         +----------+--------+--------+--------+------------------+--------+ CCA Distal124     15                                         +----------+--------+--------+--------+------------------+--------+ ICA Prox  123     20      1-39%   heterogenous               +----------+--------+--------+--------+------------------+--------+ ICA Distal114     21                                         +----------+--------+--------+--------+------------------+--------+ ECA       115     10                                         +----------+--------+--------+--------+------------------+--------+  +----------+--------+--------+----------------+-------------------+           PSV cm/sEDV cm/sDescribe        Arm Pressure (mmHG) +----------+--------+--------+----------------+-------------------+ [id number]     21      Multiphasic, WNL                    +----------+--------+--------+----------------+-------------------+ +---------+--------+--+--------+--+---------+ VertebralPSV cm/s58EDV cm/s10Antegrade +---------+--------+--+--------+--+---------+ Bilateral tortuous ICAs.  Summary: Right Carotid: Velocities in the right ICA are consistent with a 40-59%  stenosis. Left Carotid: Velocities in the left ICA are consistent with a 1-39% stenosis. Vertebrals:  Bilateral vertebral arteries demonstrate antegrade flow. Subclavians: Normal flow hemodynamics were seen in bilateral subclavian              arteries. *See table(s) above for measurements and observations.  Electronically signed by Antony Contras MD on 04/27/2019 at 1:06:21 PM.    Final        Discharge Exam: Vitals:   04/28/19 0333 04/28/19 0807  BP: (!) 124/44 (!) 144/58  Pulse: 61 65  Resp: 16 16  Temp: 97.9 F (36.6 C) 97.9 F (36.6 C)  SpO2: 95% 95%    GENERAL: No acute distress.  Appears well.  HEENT: MMM.  Vision and hearing grossly intact.  NECK: Supple.  No apparent JVD.  RESP:  No IWOB. Good air movement bilaterally. CVS:  RRR. Heart sounds normal.  ABD/GI/GU: Bowel sounds present. Soft. Non tender.  MSK/EXT:  Moves extremities. No apparent deformity or edema.  SKIN: no apparent skin lesion or wound NEURO: Awake, alert and oriented fairly..  No apparent focal neuro deficit. PSYCH: Calm. Normal affect.   The results of significant diagnostics from this hospitalization (including imaging, microbiology, ancillary and laboratory) are listed below for reference.     Microbiology: Recent Results (from the past 240 hour(s))  SARS CORONAVIRUS 2 (TAT 6-24 HRS) Nasopharyngeal Nasopharyngeal Swab      Status: None   Collection Time: 04/26/19  4:49 PM   Specimen: Nasopharyngeal Swab  Result Value Ref Range Status   SARS Coronavirus 2 NEGATIVE NEGATIVE Final    Comment: (NOTE) SARS-CoV-2 target nucleic acids are NOT DETECTED. The SARS-CoV-2 RNA is generally detectable in upper and lower respiratory specimens during the acute phase of infection. Negative results do not preclude SARS-CoV-2 infection, do not rule out co-infections with other pathogens, and should not be used as the sole basis for treatment or other patient management decisions. Negative results must be combined with clinical observations, patient history, and epidemiological information. The expected result is Negative. Fact Sheet for Patients: SugarRoll.be Fact Sheet for Healthcare Providers: https://www.woods-mathews.com/ This test is not yet approved or cleared by the [place] FDA and  has been authorized for detection and/or diagnosis of SARS-CoV-2 by FDA under an Emergency Use Authorization (EUA). This EUA will remain  in effect (meaning this test can be used) for the duration of the COVID-19 declaration under Section 56 4(b)(1) of the Act, 21 U.S.C. section 360bbb-3(b)(1), unless the authorization is terminated or revoked sooner. Performed at [facility], [place] [address], [place], [place] [address]      Labs: BNP (last 3 results) Recent Labs    04/26/19 1258  BNP 0000000   Basic Metabolic Panel: Recent Labs  Lab 04/26/19 1258 04/26/19 1305 04/28/19 0308  NA 129* 129* 133*  K 4.3 4.3 4.1  CL 94* 94* 98  CO2 22  --  24  GLUCOSE 106* 102* 91  BUN 25* 24* 29*  CREATININE 1.25* 1.30* 1.25*  CALCIUM 9.2  --  9.0   Liver Function Tests: Recent Labs  Lab 04/26/19 1258  AST 18  ALT 14  ALKPHOS 64  BILITOT 0.6  PROT 6.3*  ALBUMIN 3.5   No results for input(s): LIPASE, AMYLASE in the last 168 hours. No results for input(s): AMMONIA in the  last 168 hours. CBC: Recent Labs  Lab 04/26/19 1258 04/26/19 1305  WBC 7.1  --   NEUTROABS 4.8  --  HGB 9.2* 9.5*  HCT 28.6* 28.0*  MCV 82.9  --   PLT 153  --    Cardiac Enzymes: No results for input(s): CKTOTAL, CKMB, CKMBINDEX, TROPONINI in the last 168 hours. BNP: Invalid input(s): POCBNP CBG: Recent Labs  Lab 04/27/19 0615 04/28/19 0604  GLUCAP 89 92   D-Dimer No results for input(s): DDIMER in the last 72 hours. Hgb A1c Recent Labs    04/26/19 1650  HGBA1C 5.6   Lipid Profile Recent Labs    04/27/19 0424  CHOL 127  HDL 59  LDLCALC 62  TRIG 32  CHOLHDL 2.2   Thyroid function studies Recent Labs    04/26/19 1650  TSH 1.345   Anemia work up Recent Labs    04/26/19 1752  FERRITIN 309*  TIBC 228*  IRON 47  RETICCTPCT 1.0   Urinalysis    Component Value Date/Time   COLORURINE YELLOW 04/26/2019 1922   APPEARANCEUR CLEAR 04/26/2019 1922   LABSPEC 1.008 04/26/2019 [place] 7.0 04/26/2019 1922   GLUCOSEU NEGATIVE 04/26/2019 [place] NEGATIVE 04/26/2019 [place] NEGATIVE 04/26/2019 [place] NEGATIVE 04/26/2019 1922   PROTEINUR NEGATIVE 04/26/2019 1922   UROBILINOGEN 0.2 12/03/2013 1024   NITRITE NEGATIVE 04/26/2019 1922   LEUKOCYTESUR LARGE (A) 04/26/2019 1922   Sepsis Labs Invalid input(s): PROCALCITONIN,  WBC,  LACTICIDVEN   Time coordinating discharge: 35 minutes  SIGNED:  Mercy Riding, MD  [facility] 04/28/2019, 9:50 AM  If 7PM-7AM, please contact night-coverage [email or web address] Password [email or web address]

## 2019-04-28 NOTE — Progress Notes (Signed)
Patient being discharged home via [facility], daughter waiting at home. Belongings returned to patient and patient educated on discharge instructions.

## 2019-04-29 DIAGNOSIS — Z86711 Personal history of pulmonary embolism: Secondary | ICD-10-CM | POA: Diagnosis not present

## 2019-04-29 DIAGNOSIS — Z95828 Presence of other vascular implants and grafts: Secondary | ICD-10-CM | POA: Diagnosis not present

## 2019-04-29 DIAGNOSIS — M1990 Unspecified osteoarthritis, unspecified site: Secondary | ICD-10-CM | POA: Diagnosis not present

## 2019-04-29 DIAGNOSIS — Z6841 Body Mass Index (BMI) 40.0 and over, adult: Secondary | ICD-10-CM | POA: Diagnosis not present

## 2019-04-29 DIAGNOSIS — I480 Paroxysmal atrial fibrillation: Secondary | ICD-10-CM | POA: Diagnosis not present

## 2019-04-29 DIAGNOSIS — G459 Transient cerebral ischemic attack, unspecified: Secondary | ICD-10-CM | POA: Diagnosis not present

## 2019-04-29 DIAGNOSIS — E039 Hypothyroidism, unspecified: Secondary | ICD-10-CM | POA: Diagnosis not present

## 2019-04-29 DIAGNOSIS — Z7901 Long term (current) use of anticoagulants: Secondary | ICD-10-CM | POA: Diagnosis not present

## 2019-04-29 DIAGNOSIS — Z8673 Personal history of transient ischemic attack (TIA), and cerebral infarction without residual deficits: Secondary | ICD-10-CM | POA: Diagnosis not present

## 2019-04-29 DIAGNOSIS — B0222 Postherpetic trigeminal neuralgia: Secondary | ICD-10-CM | POA: Diagnosis not present

## 2019-04-29 DIAGNOSIS — E785 Hyperlipidemia, unspecified: Secondary | ICD-10-CM | POA: Diagnosis not present

## 2019-04-29 DIAGNOSIS — N390 Urinary tract infection, site not specified: Secondary | ICD-10-CM | POA: Diagnosis not present

## 2019-04-29 DIAGNOSIS — N183 Chronic kidney disease, stage 3 unspecified: Secondary | ICD-10-CM | POA: Diagnosis not present

## 2019-04-29 DIAGNOSIS — E559 Vitamin D deficiency, unspecified: Secondary | ICD-10-CM | POA: Diagnosis not present

## 2019-04-29 DIAGNOSIS — F0280 Dementia in other diseases classified elsewhere without behavioral disturbance: Secondary | ICD-10-CM | POA: Diagnosis not present

## 2019-04-29 DIAGNOSIS — Z86718 Personal history of other venous thrombosis and embolism: Secondary | ICD-10-CM | POA: Diagnosis not present

## 2019-04-29 DIAGNOSIS — K5790 Diverticulosis of intestine, part unspecified, without perforation or abscess without bleeding: Secondary | ICD-10-CM | POA: Diagnosis not present

## 2019-04-29 DIAGNOSIS — I129 Hypertensive chronic kidney disease with stage 1 through stage 4 chronic kidney disease, or unspecified chronic kidney disease: Secondary | ICD-10-CM | POA: Diagnosis not present

## 2019-04-29 DIAGNOSIS — E669 Obesity, unspecified: Secondary | ICD-10-CM | POA: Diagnosis not present

## 2019-04-29 DIAGNOSIS — E871 Hypo-osmolality and hyponatremia: Secondary | ICD-10-CM | POA: Diagnosis not present

## 2019-04-29 DIAGNOSIS — W19XXXD Unspecified fall, subsequent encounter: Secondary | ICD-10-CM | POA: Diagnosis not present

## 2019-04-29 DIAGNOSIS — E089 Diabetes mellitus due to underlying condition without complications: Secondary | ICD-10-CM | POA: Diagnosis not present

## 2019-05-01 DIAGNOSIS — N390 Urinary tract infection, site not specified: Secondary | ICD-10-CM | POA: Diagnosis not present

## 2019-05-01 DIAGNOSIS — I480 Paroxysmal atrial fibrillation: Secondary | ICD-10-CM | POA: Diagnosis not present

## 2019-05-01 DIAGNOSIS — N183 Chronic kidney disease, stage 3 unspecified: Secondary | ICD-10-CM | POA: Diagnosis not present

## 2019-05-01 DIAGNOSIS — E871 Hypo-osmolality and hyponatremia: Secondary | ICD-10-CM | POA: Diagnosis not present

## 2019-05-01 DIAGNOSIS — E039 Hypothyroidism, unspecified: Secondary | ICD-10-CM | POA: Diagnosis not present

## 2019-05-01 DIAGNOSIS — I129 Hypertensive chronic kidney disease with stage 1 through stage 4 chronic kidney disease, or unspecified chronic kidney disease: Secondary | ICD-10-CM | POA: Diagnosis not present

## 2019-05-03 DIAGNOSIS — I480 Paroxysmal atrial fibrillation: Secondary | ICD-10-CM | POA: Diagnosis not present

## 2019-05-03 DIAGNOSIS — N390 Urinary tract infection, site not specified: Secondary | ICD-10-CM | POA: Diagnosis not present

## 2019-05-03 DIAGNOSIS — E039 Hypothyroidism, unspecified: Secondary | ICD-10-CM | POA: Diagnosis not present

## 2019-05-03 DIAGNOSIS — N183 Chronic kidney disease, stage 3 unspecified: Secondary | ICD-10-CM | POA: Diagnosis not present

## 2019-05-03 DIAGNOSIS — I129 Hypertensive chronic kidney disease with stage 1 through stage 4 chronic kidney disease, or unspecified chronic kidney disease: Secondary | ICD-10-CM | POA: Diagnosis not present

## 2019-05-03 DIAGNOSIS — E871 Hypo-osmolality and hyponatremia: Secondary | ICD-10-CM | POA: Diagnosis not present

## 2019-05-04 DIAGNOSIS — I129 Hypertensive chronic kidney disease with stage 1 through stage 4 chronic kidney disease, or unspecified chronic kidney disease: Secondary | ICD-10-CM | POA: Diagnosis not present

## 2019-05-04 DIAGNOSIS — N390 Urinary tract infection, site not specified: Secondary | ICD-10-CM | POA: Diagnosis not present

## 2019-05-04 DIAGNOSIS — E871 Hypo-osmolality and hyponatremia: Secondary | ICD-10-CM | POA: Diagnosis not present

## 2019-05-04 DIAGNOSIS — I480 Paroxysmal atrial fibrillation: Secondary | ICD-10-CM | POA: Diagnosis not present

## 2019-05-04 DIAGNOSIS — N183 Chronic kidney disease, stage 3 unspecified: Secondary | ICD-10-CM | POA: Diagnosis not present

## 2019-05-04 DIAGNOSIS — E039 Hypothyroidism, unspecified: Secondary | ICD-10-CM | POA: Diagnosis not present

## 2019-05-05 DIAGNOSIS — E039 Hypothyroidism, unspecified: Secondary | ICD-10-CM | POA: Diagnosis not present

## 2019-05-05 DIAGNOSIS — E871 Hypo-osmolality and hyponatremia: Secondary | ICD-10-CM | POA: Diagnosis not present

## 2019-05-05 DIAGNOSIS — I129 Hypertensive chronic kidney disease with stage 1 through stage 4 chronic kidney disease, or unspecified chronic kidney disease: Secondary | ICD-10-CM | POA: Diagnosis not present

## 2019-05-05 DIAGNOSIS — N390 Urinary tract infection, site not specified: Secondary | ICD-10-CM | POA: Diagnosis not present

## 2019-05-05 DIAGNOSIS — I480 Paroxysmal atrial fibrillation: Secondary | ICD-10-CM | POA: Diagnosis not present

## 2019-05-05 DIAGNOSIS — N183 Chronic kidney disease, stage 3 unspecified: Secondary | ICD-10-CM | POA: Diagnosis not present

## 2019-05-06 DIAGNOSIS — N183 Chronic kidney disease, stage 3 unspecified: Secondary | ICD-10-CM | POA: Diagnosis not present

## 2019-05-06 DIAGNOSIS — I480 Paroxysmal atrial fibrillation: Secondary | ICD-10-CM | POA: Diagnosis not present

## 2019-05-06 DIAGNOSIS — E871 Hypo-osmolality and hyponatremia: Secondary | ICD-10-CM | POA: Diagnosis not present

## 2019-05-06 DIAGNOSIS — N390 Urinary tract infection, site not specified: Secondary | ICD-10-CM | POA: Diagnosis not present

## 2019-05-06 DIAGNOSIS — E039 Hypothyroidism, unspecified: Secondary | ICD-10-CM | POA: Diagnosis not present

## 2019-05-06 DIAGNOSIS — I129 Hypertensive chronic kidney disease with stage 1 through stage 4 chronic kidney disease, or unspecified chronic kidney disease: Secondary | ICD-10-CM | POA: Diagnosis not present

## 2019-05-07 DIAGNOSIS — E871 Hypo-osmolality and hyponatremia: Secondary | ICD-10-CM | POA: Diagnosis not present

## 2019-05-07 DIAGNOSIS — R55 Syncope and collapse: Secondary | ICD-10-CM | POA: Diagnosis not present

## 2019-05-07 DIAGNOSIS — N390 Urinary tract infection, site not specified: Secondary | ICD-10-CM | POA: Diagnosis not present

## 2019-05-07 DIAGNOSIS — F0390 Unspecified dementia without behavioral disturbance: Secondary | ICD-10-CM | POA: Diagnosis not present

## 2019-05-10 DIAGNOSIS — E871 Hypo-osmolality and hyponatremia: Secondary | ICD-10-CM | POA: Diagnosis not present

## 2019-05-10 DIAGNOSIS — I129 Hypertensive chronic kidney disease with stage 1 through stage 4 chronic kidney disease, or unspecified chronic kidney disease: Secondary | ICD-10-CM | POA: Diagnosis not present

## 2019-05-10 DIAGNOSIS — I480 Paroxysmal atrial fibrillation: Secondary | ICD-10-CM | POA: Diagnosis not present

## 2019-05-10 DIAGNOSIS — E039 Hypothyroidism, unspecified: Secondary | ICD-10-CM | POA: Diagnosis not present

## 2019-05-10 DIAGNOSIS — N390 Urinary tract infection, site not specified: Secondary | ICD-10-CM | POA: Diagnosis not present

## 2019-05-10 DIAGNOSIS — Z23 Encounter for immunization: Secondary | ICD-10-CM | POA: Diagnosis not present

## 2019-05-10 DIAGNOSIS — N183 Chronic kidney disease, stage 3 unspecified: Secondary | ICD-10-CM | POA: Diagnosis not present

## 2019-05-11 DIAGNOSIS — N183 Chronic kidney disease, stage 3 unspecified: Secondary | ICD-10-CM | POA: Diagnosis not present

## 2019-05-11 DIAGNOSIS — I480 Paroxysmal atrial fibrillation: Secondary | ICD-10-CM | POA: Diagnosis not present

## 2019-05-11 DIAGNOSIS — I129 Hypertensive chronic kidney disease with stage 1 through stage 4 chronic kidney disease, or unspecified chronic kidney disease: Secondary | ICD-10-CM | POA: Diagnosis not present

## 2019-05-11 DIAGNOSIS — N390 Urinary tract infection, site not specified: Secondary | ICD-10-CM | POA: Diagnosis not present

## 2019-05-11 DIAGNOSIS — D638 Anemia in other chronic diseases classified elsewhere: Secondary | ICD-10-CM | POA: Diagnosis not present

## 2019-05-11 DIAGNOSIS — E039 Hypothyroidism, unspecified: Secondary | ICD-10-CM | POA: Diagnosis not present

## 2019-05-11 DIAGNOSIS — E871 Hypo-osmolality and hyponatremia: Secondary | ICD-10-CM | POA: Diagnosis not present

## 2019-05-13 ENCOUNTER — Telehealth: Payer: Self-pay | Admitting: Licensed Clinical Social Worker

## 2019-05-13 DIAGNOSIS — N390 Urinary tract infection, site not specified: Secondary | ICD-10-CM | POA: Diagnosis not present

## 2019-05-13 DIAGNOSIS — I129 Hypertensive chronic kidney disease with stage 1 through stage 4 chronic kidney disease, or unspecified chronic kidney disease: Secondary | ICD-10-CM | POA: Diagnosis not present

## 2019-05-13 DIAGNOSIS — E871 Hypo-osmolality and hyponatremia: Secondary | ICD-10-CM | POA: Diagnosis not present

## 2019-05-13 DIAGNOSIS — I480 Paroxysmal atrial fibrillation: Secondary | ICD-10-CM | POA: Diagnosis not present

## 2019-05-13 DIAGNOSIS — E039 Hypothyroidism, unspecified: Secondary | ICD-10-CM | POA: Diagnosis not present

## 2019-05-13 DIAGNOSIS — N183 Chronic kidney disease, stage 3 unspecified: Secondary | ICD-10-CM | POA: Diagnosis not present

## 2019-05-13 NOTE — Telephone Encounter (Signed)
Palliative Care SW left a vm for patient's daughter, [name], to schedule a home visit.

## 2019-05-14 DIAGNOSIS — E039 Hypothyroidism, unspecified: Secondary | ICD-10-CM | POA: Diagnosis not present

## 2019-05-14 DIAGNOSIS — E871 Hypo-osmolality and hyponatremia: Secondary | ICD-10-CM | POA: Diagnosis not present

## 2019-05-14 DIAGNOSIS — I480 Paroxysmal atrial fibrillation: Secondary | ICD-10-CM | POA: Diagnosis not present

## 2019-05-14 DIAGNOSIS — N390 Urinary tract infection, site not specified: Secondary | ICD-10-CM | POA: Diagnosis not present

## 2019-05-14 DIAGNOSIS — I129 Hypertensive chronic kidney disease with stage 1 through stage 4 chronic kidney disease, or unspecified chronic kidney disease: Secondary | ICD-10-CM | POA: Diagnosis not present

## 2019-05-14 DIAGNOSIS — N183 Chronic kidney disease, stage 3 unspecified: Secondary | ICD-10-CM | POA: Diagnosis not present

## 2019-05-18 DIAGNOSIS — E039 Hypothyroidism, unspecified: Secondary | ICD-10-CM | POA: Diagnosis not present

## 2019-05-18 DIAGNOSIS — I480 Paroxysmal atrial fibrillation: Secondary | ICD-10-CM | POA: Diagnosis not present

## 2019-05-18 DIAGNOSIS — I129 Hypertensive chronic kidney disease with stage 1 through stage 4 chronic kidney disease, or unspecified chronic kidney disease: Secondary | ICD-10-CM | POA: Diagnosis not present

## 2019-05-18 DIAGNOSIS — N390 Urinary tract infection, site not specified: Secondary | ICD-10-CM | POA: Diagnosis not present

## 2019-05-18 DIAGNOSIS — N183 Chronic kidney disease, stage 3 unspecified: Secondary | ICD-10-CM | POA: Diagnosis not present

## 2019-05-18 DIAGNOSIS — E871 Hypo-osmolality and hyponatremia: Secondary | ICD-10-CM | POA: Diagnosis not present

## 2019-05-19 ENCOUNTER — Other Ambulatory Visit: Payer: Medicare Other | Admitting: Licensed Clinical Social Worker

## 2019-05-19 DIAGNOSIS — N183 Chronic kidney disease, stage 3 unspecified: Secondary | ICD-10-CM | POA: Diagnosis not present

## 2019-05-19 DIAGNOSIS — I480 Paroxysmal atrial fibrillation: Secondary | ICD-10-CM | POA: Diagnosis not present

## 2019-05-19 DIAGNOSIS — N390 Urinary tract infection, site not specified: Secondary | ICD-10-CM | POA: Diagnosis not present

## 2019-05-19 DIAGNOSIS — I129 Hypertensive chronic kidney disease with stage 1 through stage 4 chronic kidney disease, or unspecified chronic kidney disease: Secondary | ICD-10-CM | POA: Diagnosis not present

## 2019-05-19 DIAGNOSIS — Z515 Encounter for palliative care: Secondary | ICD-10-CM

## 2019-05-19 DIAGNOSIS — E871 Hypo-osmolality and hyponatremia: Secondary | ICD-10-CM | POA: Diagnosis not present

## 2019-05-19 DIAGNOSIS — E039 Hypothyroidism, unspecified: Secondary | ICD-10-CM | POA: Diagnosis not present

## 2019-05-19 NOTE — Progress Notes (Signed)
Virtual Visit via Video Note The purpose of this virtual visit is to provide medical care while limiting exposure to the novel coronavirus.    Consent was obtained for video visit:  Yes.   Answered questions that patient had about telehealth interaction:  Yes.   I discussed the limitations, risks, security and privacy concerns of performing an evaluation and management service by telemedicine. I also discussed with the patient that there may be a patient responsible charge related to this service. The patient expressed understanding and agreed to proceed.  Pt location: Home Physician Location: office Name of referring provider:  [name], MD I connected with [name] at patients initiation/request on 05/20/2019 at  1:30 PM EST by video enabled telemedicine application and verified that I am speaking with the correct person using two identifiers. Pt MRN:  [id number] Pt DOB:  1930/03/02 Video Participants:  [name];  Her daughter   History of Present Illness:  [name] is an 86 year old right-handed female with hypertension, CAD, atrial fibrillation, hypercholesterolemia, type 2 diabetes mellitus, migraine, anxiety, sleep apnea and history of stroke with residual right-sided weakness, DVT and PE (on Lovenox) who follows up for mixed Alzheimer's and vascular dementia and right post-herpetic trigeminal neuralgia.  She is accompanied by her daughter who supplements history.  CT and MRI head from recent hospitalization personally reviewed.  1. UPDATE: She had a fall in July in which she fractured the medial condyle of her distal right femur with joint effusion and hemorrhage.  She was not a candidate for surgery due to her comorbidities.  She has had a cognitive decline since then.  She moved in with her daughter.  She started having recurrent UTIs.  She also developed severe right knee pain.  She had a couple of episodes of syncope that occurred while transferring from her  bed to the chair.  There was associated pain during these transfers.    When she was transferred to the wheelchair, her head rolled over with eyes open, tongue hanging out but not responsive to verbal stimuli.  She had urinary incontinence.  No associated convulsive movement.  She She was admitted to [facility] on 04/26/2019 after another syncopal event in setting of transfer from bed to wheelchair.    She steadily returned to baseline after 3 to 5 minutes.  A similar event happened before during transfer from bed to chair.  Orthostatic vitals were unremarkable.  CT head showed no acute intracranial abnormality.  MRI of brain without contrast showed stable atrophy, chronic small vessel ischemic changes with chronic microhemorrhages and remote lacunar infarcts in cerebellum and basal ganglia but no new or acute abnormality.  MRA of head showed no large vessel stenosis or occlusion.  Echocardiogram showed EF 60-65% with no cardiac source of embolus.  Carotid ultrasound showed 40-59% right ICA stenosis, 1-39% left ICA stenosis and antegrade flow in both vertebral arteries.  Vasovagal syncope was suspected.  Since then, [name] has been involved.  She is receiving PT, OT and speech therapy.  There is an aid that helps her with bathing.  She now has a [employer] fast streamer to make transfer easier.    2.DEMENTIA: History: Memory deficits started around 2007. It was fairly sudden onset and has not progressed.She tends to repeat herself often, such as telling stories. She repeats questions often. She reports word-finding difficulties as well.She does have difficulty using the phone and managing her finances. She typically lives by herself in  her one story house. She manages her own finances. She doesn't drive 2 to remote history of blackout spells. She typically does not have problems recognizing people or recalling names. She denies any depression. She denies any hallucinations. She does have history of  vivid nightmares.She reports a maternal aunt with history of dementia. To evaluate cognitive problems, an MRI of the brain was performed on 12/01/13, which revealed extensive small vessel ischemic changes throughout the deep and subcortical white matter, including remote small infarcts involving the cerebellum and left basal ganglia.Chronic micro-hemorrhages also noted, most notably in the right frontal lobe. No acute findings noted.  For several years, she has episodes of recurrent speech hesitancy.She knows what she wants to say but has trouble getting the words out.In the past, it has occurred in setting of UTI.To evaluate episodes of transient speech dysfunction, EEG was performed on 03/10/14, which was normal.She went to speech therapy without any real improvement. Therapist thinks it is likely medication-related.  She underwent neuropsychological testing on 01/12/15. Testing was consistent with mild dementia. She exhibited cognitive dysfunction involving the medial-temporal lobe, which is suggestive of Alzheimer's disease. However, family endorses abrupt onset, which correlates with vascular etiology. Repeat MRI of the brain performed 02/22/15, stable compared to prior imaging from 12/01/13. She had repeat neurocognitive testing on 01/02/16. Cognitive deficits/mild dementia stable compared to one year ago.  Update: She takes Namzaric.She feels her memory is worse since last year.  She has to stop and think a lot.  She has increased word-finding difficulty.  She manages her medications independently.  Her daughter manages her finances.  Her math is good but had difficulty filling out a check.  She sees a therapist once a month, which is helpful  3RIGHT-SIDED POST-HERPETIC TRIGEMINAL NEURALGIA: History: Onset of symptoms started in 2006, following episode of herpes zoster involving she describes sharp pain involving the the right V1 and V2 distribution, associated with  allodynia. Chewing, talking and brushing her teeth triggers and aggravates the pain.She was subsequently treated for postherpetic neuralgia.  Past medications:carbamazepine generic which led to breakthrough pain.  Update: Current medication:  Carbatrol 200mg  twice daily, [name] 300mg  twice daily.  Pain is controlled.  Past Medical History: Past Medical History:  Diagnosis Date  . A-fib ([place])   . Acid reflux   . Anxiety   . Arthritis   . Chronic anticoagulation 03/07/2013   Lovenox 100 QD  . Chronic kidney disease    Stage III  . CVA (cerebral infarction) 1964  . Dementia [facility])    Dr Brett Fairy  . Diverticulosis   . DVT (deep venous thrombosis) ([place]) 2007   recurrent w PE s/p [place] filter  . GERD (gastroesophageal reflux disease)    w hiatal hernia endoscopy 2003, Dr Penelope Coop  . Glaucoma   . Hypercholesterolemia   . Hypertension   . Hypoglycemia   . Hypothyroidism    goiter  . IBS (irritable bowel syndrome)   . Junctional bradycardia    resolved with discontinuation of sotalol (2014) after 10 yrs  . Knee injury   . Memory loss   . Migraine   . Osteoarthritis    in Bilateral knees and back. -Dr Latanya Maudlin  . Osteoporosis   . Panic attacks   . Phlebitis   . Postherpetic trigeminal neuralgia 10/20/2012  . Pulmonary embolism recurrent    IVC filter  . Shingles    zoster 1990 on the back and on the abdomen in 2007,facial zoster 2006  . Trigeminal neuralgia  r face--Dr Dohmeier  . Unspecified cerebral artery occlusion with cerebral infarction   . Vitamin D deficiency     Medications: Outpatient Encounter Medications as of 05/20/2019  Medication Sig  . acetaminophen (TYLENOL) 500 MG tablet Take 2 tablets (1,000 mg total) by mouth every 8 (eight) hours.  . carbamazepine (CARBATROL) 100 MG 12 hr capsule TAKE 2 CAPSULES BY MOUTH 2 TIMES DAILY (Patient taking differently: Take 100 mg by mouth 2 (two) times daily. )  . cephALEXin (KEFLEX) 250 MG capsule Take  250 mg by mouth daily.  . cholecalciferol (VITAMIN D) 1000 units tablet Take 2,000 Units by mouth daily.   Marland Kitchen ELIQUIS 5 MG TABS tablet Take 5 mg by mouth 2 (two) times daily.  Marland Kitchen EPINEPHrine (EPIPEN 2-PAK) 0.3 mg/0.3 mL IJ SOAJ injection Inject 0.3 mg into the muscle as needed for anaphylaxis.  . feeding supplement, ENSURE ENLIVE, (ENSURE ENLIVE) LIQD Take 237 mLs by mouth daily at 3 pm. (Patient not taking: Reported on 04/26/2019)  . fluticasone (FLONASE) 50 MCG/ACT nasal spray Place 1 spray into left nostril daily as needed for allergies or rhinitis.   . folic acid (FOLVITE) 1 MG tablet Take 1 mg by mouth daily.   Marland Kitchen gabapentin (NEURONTIN) 300 MG capsule Take 1 capsule (300 mg total) by mouth 2 (two) times daily.  Marland Kitchen levothyroxine (SYNTHROID, LEVOTHROID) 75 MCG tablet Take 75 mcg by mouth daily before breakfast.   . loperamide (IMODIUM A-D) 2 MG tablet Take 2 mg by mouth 4 (four) times daily as needed for diarrhea or loose stools.  Marland Kitchen loratadine (CLARITIN) 10 MG tablet Take 10 mg by mouth daily.  . Memantine HCl-Donepezil HCl (NAMZARIC) 28-10 MG CP24 Take 1 capsule by mouth daily. (Patient taking differently: Take 1 capsule by mouth at bedtime. )  . Multiple Vitamin (MULTIVITAMIN WITH MINERALS) TABS tablet Take 1 tablet by mouth daily.  . pravastatin (PRAVACHOL) 40 MG tablet Take 40 mg by mouth at bedtime.   Marland Kitchen ROCKLATAN 0.02-0.005 % SOLN Place 1 drop into both eyes at bedtime.  . sodium chloride 1 G tablet Take 1 tablet (1 g total) by mouth 2 (two) times daily with a meal. (Patient not taking: Reported on 04/26/2019)   No facility-administered encounter medications on file as of 05/20/2019.    Allergies: Allergies  Allergen Reactions  . Benadryl [Diphenhydramine Hcl] Hives and Shortness Of Breath  . Eye Drops [Tetrahydrozoline Hcl] Other (See Comments)    Burning, itching, swelling  . Flu Virus Vaccine Shortness Of Breath  . Iohexol Hives and Shortness Of Breath     Code: HIVES, Desc: PT IS  ALLERGIC TO IVP DYE/IODINE/SHRIMP.  CAUSES SOB AND HIVES PER PT.  STEPHANIE, RT-R, Onset Date: [id number]   . Shrimp [Shellfish Allergy] Anaphylaxis  . Voltaren [Diclofenac Sodium] Other (See Comments)    Acid reflux symptoms  . Ambien [Zolpidem Tartrate] Hives  . Gluten Meal Diarrhea  . Travatan Z [Travoprost] Other (See Comments)    BRAND NAME ONLY OLCANNOT TOLERATE GENERIC FORM OF EYE DROPS, IT CONTAINS PRESERVATIVES-SENSITIVITY  . Ativan [Lorazepam]     delusional  . Hydrocodone     Only on the higher dosages  . Betadine Antibiotic-Moisturize [Bacitracin-Polymyxin B] Rash  . Biaxin [Clarithromycin] Hives  . Celebrex [Celecoxib] Rash  . Ciprofloxacin Hcl Rash       . Combigan [Brimonidine Tartrate-Timolol] Other (See Comments)    unknown  . Coumadin [Warfarin Sodium] Rash  . Ivp Dye [Iodinated Diagnostic Agents] Hives  . Levaquin [Levofloxacin  In D5w] Rash  . Naprosyn [Naproxen] Rash  . Pataday [Olopatadine Hcl] Other (See Comments)  . Penicillins Hives    Has patient had a PCN reaction causing immediate rash, facial/tongue/throat swelling, SOB or lightheadedness with hypotension: Yes Has patient had a PCN reaction causing severe rash involving mucus membranes or skin necrosis: No Has patient had a PCN reaction that required hospitalization No Has patient had a PCN reaction occurring within the last 10 years: No If all of the above answers are "NO", then may proceed with Cephalosporin use.   Marland Kitchen Plavix [Clopidogrel Bisulfate] Rash  . Septra [Sulfamethoxazole-Trimethoprim] Hives    Family History: Family History  Problem Relation Age of Onset  . Diabetes Mother   . Hypertension Mother   . Glaucoma Mother   . CVA Mother   . CAD Mother   . Hypertension Father   . Heart attack Father   . Kidney failure Father   . Heart attack Brother        2 half brothers  . Hypertension Brother   . CVA Brother   . Glaucoma Other   . Glaucoma Other   . Prostate cancer Maternal  Grandfather   . Breast cancer Maternal Aunt   . Colon cancer Maternal Uncle   . Rectal cancer Maternal Uncle     Social History: Social History   Socioeconomic History  . Marital status: Divorced    Spouse name: Not on file  . Number of children: 3  . Years of education: 39  . Highest education level: Not on file  Occupational History  . 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 Smoking status: Never Smoker  . Smokeless tobacco: Never Used  Substance and Sexual Activity  . Alcohol use: No    Comment: very moderate, none  in the last two months  . Drug use: No  . Sexual activity: Never  Other Topics Concern  . Not on file  Social History Narrative   Patient is retired and lives at home alone. Patient has a college education.    Patient has three children.   Patient is right-handed.   Patient drinks 1-2 cups of caffeine daily.   Social Determinants of Health   Financial Resource Strain:   . Difficulty of Paying Living Expenses: Not on file  Food Insecurity:   . Worried About Charity fundraiser in the Last Year: Not on file  . Ran Out of Food in the Last Year: Not on file  Transportation Needs:   . Lack of Transportation (Medical): Not on file  . Lack of Transportation (Non-Medical): Not on file  Physical Activity:   . Days of Exercise per Week: Not on file  . Minutes of Exercise per Session: Not on file  Stress:   . Feeling of Stress : Not on file  Social Connections:   . Frequency of Communication with Friends and Family: Not on file  . Frequency of Social Gatherings with Friends and Family: Not on file  . Attends Religious Services: Not on file  . Active Member of Clubs or Organizations: Not on file  . Attends Archivist Meetings: Not on file  . Marital Status: Not on file  Intimate Partner Violence:   . Fear of Current or Ex-Partner: Not on file  . Emotionally Abused: Not on file  . Physically  Abused: Not on file  . Sexually Abused: Not on file  Observations/Objective:   There were no vitals taken for this visit. No acute distress.  Alert and oriented.  Speech fluent and not dysarthric.  Language intact.    Assessment and Plan:   1.  Probable vasovagal syncope in setting of UTI.  Will check EEG for possible epileptogenic etiology. 2.  Major neurocognitive disorder, mixed Alzheimer's and vascular.  Progressed since fall in July. 3.  Right sided postherpetic trigeminal neuralgia, stable  1.  Check routine EEG 2.  Refilled Namzaric, carbamazepine 100mg  twice daily and gabapentin 300mg  twice daily 3.  Follow up in 6 months.  Follow Up Instructions:    -I discussed the assessment and treatment plan with the patient. The patient was provided an opportunity to ask questions and all were answered. The patient agreed with the plan and demonstrated an understanding of the instructions.   The patient was advised to call back or seek an in-person evaluation if the symptoms worsen or if the condition fails to improve as anticipated.   Dudley Major, DO

## 2019-05-20 ENCOUNTER — Encounter: Payer: Self-pay | Admitting: Neurology

## 2019-05-20 ENCOUNTER — Telehealth (INDEPENDENT_AMBULATORY_CARE_PROVIDER_SITE_OTHER): Payer: Medicare Other | Admitting: Neurology

## 2019-05-20 ENCOUNTER — Other Ambulatory Visit: Payer: Self-pay

## 2019-05-20 DIAGNOSIS — R55 Syncope and collapse: Secondary | ICD-10-CM

## 2019-05-20 DIAGNOSIS — N390 Urinary tract infection, site not specified: Secondary | ICD-10-CM | POA: Diagnosis not present

## 2019-05-20 DIAGNOSIS — N183 Chronic kidney disease, stage 3 unspecified: Secondary | ICD-10-CM | POA: Diagnosis not present

## 2019-05-20 DIAGNOSIS — I480 Paroxysmal atrial fibrillation: Secondary | ICD-10-CM | POA: Diagnosis not present

## 2019-05-20 DIAGNOSIS — B0222 Postherpetic trigeminal neuralgia: Secondary | ICD-10-CM

## 2019-05-20 DIAGNOSIS — F0150 Vascular dementia without behavioral disturbance: Secondary | ICD-10-CM

## 2019-05-20 DIAGNOSIS — F0280 Dementia in other diseases classified elsewhere without behavioral disturbance: Secondary | ICD-10-CM | POA: Diagnosis not present

## 2019-05-20 DIAGNOSIS — E039 Hypothyroidism, unspecified: Secondary | ICD-10-CM | POA: Diagnosis not present

## 2019-05-20 DIAGNOSIS — G309 Alzheimer's disease, unspecified: Secondary | ICD-10-CM

## 2019-05-20 DIAGNOSIS — I129 Hypertensive chronic kidney disease with stage 1 through stage 4 chronic kidney disease, or unspecified chronic kidney disease: Secondary | ICD-10-CM | POA: Diagnosis not present

## 2019-05-20 DIAGNOSIS — E871 Hypo-osmolality and hyponatremia: Secondary | ICD-10-CM | POA: Diagnosis not present

## 2019-05-20 MED ORDER — NAMZARIC 28-10 MG PO CP24: 1.0000 | ORAL_CAPSULE | Freq: Every day | ORAL | 5 refills | Status: DC

## 2019-05-20 MED ORDER — CARBAMAZEPINE ER 100 MG PO CP12: 100.0000 mg | ORAL_CAPSULE | Freq: Two times a day (BID) | ORAL | 5 refills | Status: DC

## 2019-05-20 MED ORDER — GABAPENTIN 300 MG PO CAPS: 300.0000 mg | ORAL_CAPSULE | Freq: Two times a day (BID) | ORAL | 5 refills | Status: DC

## 2019-05-21 DIAGNOSIS — E871 Hypo-osmolality and hyponatremia: Secondary | ICD-10-CM | POA: Diagnosis not present

## 2019-05-21 DIAGNOSIS — N183 Chronic kidney disease, stage 3 unspecified: Secondary | ICD-10-CM | POA: Diagnosis not present

## 2019-05-21 DIAGNOSIS — E039 Hypothyroidism, unspecified: Secondary | ICD-10-CM | POA: Diagnosis not present

## 2019-05-21 DIAGNOSIS — I480 Paroxysmal atrial fibrillation: Secondary | ICD-10-CM | POA: Diagnosis not present

## 2019-05-21 DIAGNOSIS — N390 Urinary tract infection, site not specified: Secondary | ICD-10-CM | POA: Diagnosis not present

## 2019-05-21 DIAGNOSIS — I129 Hypertensive chronic kidney disease with stage 1 through stage 4 chronic kidney disease, or unspecified chronic kidney disease: Secondary | ICD-10-CM | POA: Diagnosis not present

## 2019-05-21 NOTE — Progress Notes (Signed)
COMMUNITY PALLIATIVE CARE SW NOTE  PATIENT NAME: [name] DOB: Aug 03, 1929 MRN: [id number]  PRIMARY CARE PROVIDER: [name], MD  RESPONSIBLE PARTY:  Acct ID - Guarantor Home Phone Work Phone Relationship Acct Type  [id number] [name], NEMECHEK* [phone]  Self P/F     [address] [place], [place] [address]   Due to the COVID-19 crisis, this virtual check-in visit was done via telephone from my office and it was initiated and consent given by thispatient and or family.  PLAN OF CARE and INTERVENTIONS:             1. GOALS OF CARE/ ADVANCE CARE PLANNING:  Goal is for patient to remain at home with her daughter, [name].  Patient is a Full Code. 2. SOCIAL/EMOTIONAL/SPIRITUAL ASSESSMENT/ INTERVENTIONS:  SW conducted a virtual check-in visit with patient's daughter.  [name] does not wish for patient to live in a SNF due to [place] restrictions on families at this time.  Patient previously lived at [facility] prior to having a serious fall in July 2020.  Although patient is currently receiving therapies, she is not progressing.  Discussed goals of care.  Although [name] feels a DNR would be appropriate, her brother does not.  She would also like to discuss the MOST form.  SW provided information to the Palliative Care RN who will be following up. 3. PATIENT/CAREGIVER EDUCATION/ COPING:  SW provided education regarding the Palliative Care program.  [name] copes by problem-solving. 4. PERSONAL EMERGENCY PLAN:  [name] will call EMS. 5. COMMUNITY RESOURCES COORDINATION/ HEALTH CARE NAVIGATION:  [facility] provides PT/OT/ST, RN and aid services. 6. FINANCIAL/LEGAL CONCERNS/INTERVENTIONS:  None.     SOCIAL HX:  Social History   Tobacco Use  . Smoking status: Never Smoker  . Smokeless tobacco: Never Used  Substance Use Topics  . Alcohol use: No    Comment: very moderate, none  in the last two months    CODE STATUS:  Full Code ADVANCED DIRECTIVES: N MOST FORM COMPLETE:   N HOSPICE EDUCATION PROVIDED: Y Duration of visit and documentation:  30 minutes.      Creola Corn Dezman Granda, LCSW

## 2019-05-24 ENCOUNTER — Telehealth: Payer: Self-pay | Admitting: Neurology

## 2019-05-24 DIAGNOSIS — I480 Paroxysmal atrial fibrillation: Secondary | ICD-10-CM | POA: Diagnosis not present

## 2019-05-24 DIAGNOSIS — E039 Hypothyroidism, unspecified: Secondary | ICD-10-CM | POA: Diagnosis not present

## 2019-05-24 DIAGNOSIS — H401133 Primary open-angle glaucoma, bilateral, severe stage: Secondary | ICD-10-CM | POA: Diagnosis not present

## 2019-05-24 DIAGNOSIS — E871 Hypo-osmolality and hyponatremia: Secondary | ICD-10-CM | POA: Diagnosis not present

## 2019-05-24 DIAGNOSIS — H04122 Dry eye syndrome of left lacrimal gland: Secondary | ICD-10-CM | POA: Diagnosis not present

## 2019-05-24 DIAGNOSIS — N390 Urinary tract infection, site not specified: Secondary | ICD-10-CM | POA: Diagnosis not present

## 2019-05-24 DIAGNOSIS — N183 Chronic kidney disease, stage 3 unspecified: Secondary | ICD-10-CM | POA: Diagnosis not present

## 2019-05-24 DIAGNOSIS — I129 Hypertensive chronic kidney disease with stage 1 through stage 4 chronic kidney disease, or unspecified chronic kidney disease: Secondary | ICD-10-CM | POA: Diagnosis not present

## 2019-05-24 NOTE — Telephone Encounter (Signed)
Patient's pharmacy called requesting a call back. The patient wants to switch to generic Carbatrol instead of brand name to same money. They pharmacy needs either verbal authorization or a new e-script stating generic is okay.

## 2019-05-24 NOTE — Telephone Encounter (Signed)
Okay to switch to Generic?

## 2019-05-24 NOTE — Telephone Encounter (Signed)
OK to switch to generic

## 2019-05-25 DIAGNOSIS — I480 Paroxysmal atrial fibrillation: Secondary | ICD-10-CM | POA: Diagnosis not present

## 2019-05-25 DIAGNOSIS — N390 Urinary tract infection, site not specified: Secondary | ICD-10-CM | POA: Diagnosis not present

## 2019-05-25 DIAGNOSIS — N183 Chronic kidney disease, stage 3 unspecified: Secondary | ICD-10-CM | POA: Diagnosis not present

## 2019-05-25 DIAGNOSIS — I129 Hypertensive chronic kidney disease with stage 1 through stage 4 chronic kidney disease, or unspecified chronic kidney disease: Secondary | ICD-10-CM | POA: Diagnosis not present

## 2019-05-25 DIAGNOSIS — E871 Hypo-osmolality and hyponatremia: Secondary | ICD-10-CM | POA: Diagnosis not present

## 2019-05-25 DIAGNOSIS — E039 Hypothyroidism, unspecified: Secondary | ICD-10-CM | POA: Diagnosis not present

## 2019-05-25 NOTE — Telephone Encounter (Signed)
Left message to change to generic per Dr. Tomi Likens

## 2019-05-26 ENCOUNTER — Ambulatory Visit (INDEPENDENT_AMBULATORY_CARE_PROVIDER_SITE_OTHER): Payer: Medicare Other | Admitting: Neurology

## 2019-05-26 ENCOUNTER — Other Ambulatory Visit: Payer: Self-pay

## 2019-05-26 DIAGNOSIS — E871 Hypo-osmolality and hyponatremia: Secondary | ICD-10-CM | POA: Diagnosis not present

## 2019-05-26 DIAGNOSIS — N183 Chronic kidney disease, stage 3 unspecified: Secondary | ICD-10-CM | POA: Diagnosis not present

## 2019-05-26 DIAGNOSIS — N390 Urinary tract infection, site not specified: Secondary | ICD-10-CM | POA: Diagnosis not present

## 2019-05-26 DIAGNOSIS — R55 Syncope and collapse: Secondary | ICD-10-CM

## 2019-05-26 DIAGNOSIS — I129 Hypertensive chronic kidney disease with stage 1 through stage 4 chronic kidney disease, or unspecified chronic kidney disease: Secondary | ICD-10-CM | POA: Diagnosis not present

## 2019-05-26 DIAGNOSIS — E039 Hypothyroidism, unspecified: Secondary | ICD-10-CM | POA: Diagnosis not present

## 2019-05-26 DIAGNOSIS — I480 Paroxysmal atrial fibrillation: Secondary | ICD-10-CM | POA: Diagnosis not present

## 2019-05-27 DIAGNOSIS — N183 Chronic kidney disease, stage 3 unspecified: Secondary | ICD-10-CM | POA: Diagnosis not present

## 2019-05-27 DIAGNOSIS — E039 Hypothyroidism, unspecified: Secondary | ICD-10-CM | POA: Diagnosis not present

## 2019-05-27 DIAGNOSIS — I129 Hypertensive chronic kidney disease with stage 1 through stage 4 chronic kidney disease, or unspecified chronic kidney disease: Secondary | ICD-10-CM | POA: Diagnosis not present

## 2019-05-27 DIAGNOSIS — N390 Urinary tract infection, site not specified: Secondary | ICD-10-CM | POA: Diagnosis not present

## 2019-05-27 DIAGNOSIS — I480 Paroxysmal atrial fibrillation: Secondary | ICD-10-CM | POA: Diagnosis not present

## 2019-05-27 DIAGNOSIS — E871 Hypo-osmolality and hyponatremia: Secondary | ICD-10-CM | POA: Diagnosis not present

## 2019-05-27 NOTE — Procedures (Signed)
ELECTROENCEPHALOGRAM REPORT  Date of Study: 05/26/2019  Patient's Name: [name] MRN: [id number] Date of Birth: [birth year]  Clinical History: 86 year old female with dementia and post-herpetic trigeminal neuralgia who presents for syncope.  Medications: Carbamazepine Gabapentin Levothyroxine memantine-donepezil loratidine  Technical Summary: A multichannel digital EEG recording measured by the international 10-20 system with electrodes applied with paste and impedances below 5000 ohms performed in our laboratory with EKG monitoring in an awake and drowsy patient.  Hyperventilation was not performed due to wearing a mask for Covid-19.  Photic stimulation was performed.  The digital EEG was referentially recorded, reformatted, and digitally filtered in a variety of bipolar and referential montages for optimal display.    Description: The patient is awake and drowsy during the recording.  During maximal wakefulness, there diffuse and symmetric background slowing of mixed theta and delta activity, with a medium voltage 6 Hz posterior dominant rhythm.  Stage 2 sleep is not seen.  Photic stimulation did not elicit any abnormalities.  There were no epileptiform discharges or electrographic seizures seen.    EKG lead was unremarkable.  Impression: This awake and drowsy EEG is abnormal due to diffuse background slowing    Clinical Correlation: The above findings is indicative of diffuse cerebral dysfunction as may be seen in dementia or toxic-metabolic etiology.  Clinical correlation is advised.   Metta Clines, DO

## 2019-05-28 DIAGNOSIS — E039 Hypothyroidism, unspecified: Secondary | ICD-10-CM | POA: Diagnosis not present

## 2019-05-28 DIAGNOSIS — N390 Urinary tract infection, site not specified: Secondary | ICD-10-CM | POA: Diagnosis not present

## 2019-05-28 DIAGNOSIS — I129 Hypertensive chronic kidney disease with stage 1 through stage 4 chronic kidney disease, or unspecified chronic kidney disease: Secondary | ICD-10-CM | POA: Diagnosis not present

## 2019-05-28 DIAGNOSIS — N183 Chronic kidney disease, stage 3 unspecified: Secondary | ICD-10-CM | POA: Diagnosis not present

## 2019-05-28 DIAGNOSIS — I480 Paroxysmal atrial fibrillation: Secondary | ICD-10-CM | POA: Diagnosis not present

## 2019-05-28 DIAGNOSIS — E871 Hypo-osmolality and hyponatremia: Secondary | ICD-10-CM | POA: Diagnosis not present

## 2019-05-29 DIAGNOSIS — F0280 Dementia in other diseases classified elsewhere without behavioral disturbance: Secondary | ICD-10-CM | POA: Diagnosis not present

## 2019-05-29 DIAGNOSIS — Z7901 Long term (current) use of anticoagulants: Secondary | ICD-10-CM | POA: Diagnosis not present

## 2019-05-29 DIAGNOSIS — E669 Obesity, unspecified: Secondary | ICD-10-CM | POA: Diagnosis not present

## 2019-05-29 DIAGNOSIS — Z86718 Personal history of other venous thrombosis and embolism: Secondary | ICD-10-CM | POA: Diagnosis not present

## 2019-05-29 DIAGNOSIS — K5790 Diverticulosis of intestine, part unspecified, without perforation or abscess without bleeding: Secondary | ICD-10-CM | POA: Diagnosis not present

## 2019-05-29 DIAGNOSIS — I480 Paroxysmal atrial fibrillation: Secondary | ICD-10-CM | POA: Diagnosis not present

## 2019-05-29 DIAGNOSIS — Z6841 Body Mass Index (BMI) 40.0 and over, adult: Secondary | ICD-10-CM | POA: Diagnosis not present

## 2019-05-29 DIAGNOSIS — N183 Chronic kidney disease, stage 3 unspecified: Secondary | ICD-10-CM | POA: Diagnosis not present

## 2019-05-29 DIAGNOSIS — E871 Hypo-osmolality and hyponatremia: Secondary | ICD-10-CM | POA: Diagnosis not present

## 2019-05-29 DIAGNOSIS — N390 Urinary tract infection, site not specified: Secondary | ICD-10-CM | POA: Diagnosis not present

## 2019-05-29 DIAGNOSIS — M1990 Unspecified osteoarthritis, unspecified site: Secondary | ICD-10-CM | POA: Diagnosis not present

## 2019-05-29 DIAGNOSIS — Z95828 Presence of other vascular implants and grafts: Secondary | ICD-10-CM | POA: Diagnosis not present

## 2019-05-29 DIAGNOSIS — I129 Hypertensive chronic kidney disease with stage 1 through stage 4 chronic kidney disease, or unspecified chronic kidney disease: Secondary | ICD-10-CM | POA: Diagnosis not present

## 2019-05-29 DIAGNOSIS — W19XXXD Unspecified fall, subsequent encounter: Secondary | ICD-10-CM | POA: Diagnosis not present

## 2019-05-29 DIAGNOSIS — E559 Vitamin D deficiency, unspecified: Secondary | ICD-10-CM | POA: Diagnosis not present

## 2019-05-29 DIAGNOSIS — E785 Hyperlipidemia, unspecified: Secondary | ICD-10-CM | POA: Diagnosis not present

## 2019-05-29 DIAGNOSIS — Z8673 Personal history of transient ischemic attack (TIA), and cerebral infarction without residual deficits: Secondary | ICD-10-CM | POA: Diagnosis not present

## 2019-05-29 DIAGNOSIS — B0222 Postherpetic trigeminal neuralgia: Secondary | ICD-10-CM | POA: Diagnosis not present

## 2019-05-29 DIAGNOSIS — Z86711 Personal history of pulmonary embolism: Secondary | ICD-10-CM | POA: Diagnosis not present

## 2019-05-29 DIAGNOSIS — E039 Hypothyroidism, unspecified: Secondary | ICD-10-CM | POA: Diagnosis not present

## 2019-05-31 DIAGNOSIS — N183 Chronic kidney disease, stage 3 unspecified: Secondary | ICD-10-CM | POA: Diagnosis not present

## 2019-05-31 DIAGNOSIS — E039 Hypothyroidism, unspecified: Secondary | ICD-10-CM | POA: Diagnosis not present

## 2019-05-31 DIAGNOSIS — I129 Hypertensive chronic kidney disease with stage 1 through stage 4 chronic kidney disease, or unspecified chronic kidney disease: Secondary | ICD-10-CM | POA: Diagnosis not present

## 2019-05-31 DIAGNOSIS — I480 Paroxysmal atrial fibrillation: Secondary | ICD-10-CM | POA: Diagnosis not present

## 2019-05-31 DIAGNOSIS — E871 Hypo-osmolality and hyponatremia: Secondary | ICD-10-CM | POA: Diagnosis not present

## 2019-05-31 DIAGNOSIS — N390 Urinary tract infection, site not specified: Secondary | ICD-10-CM | POA: Diagnosis not present

## 2019-06-01 DIAGNOSIS — E039 Hypothyroidism, unspecified: Secondary | ICD-10-CM | POA: Diagnosis not present

## 2019-06-01 DIAGNOSIS — I129 Hypertensive chronic kidney disease with stage 1 through stage 4 chronic kidney disease, or unspecified chronic kidney disease: Secondary | ICD-10-CM | POA: Diagnosis not present

## 2019-06-01 DIAGNOSIS — I480 Paroxysmal atrial fibrillation: Secondary | ICD-10-CM | POA: Diagnosis not present

## 2019-06-01 DIAGNOSIS — E871 Hypo-osmolality and hyponatremia: Secondary | ICD-10-CM | POA: Diagnosis not present

## 2019-06-01 DIAGNOSIS — N183 Chronic kidney disease, stage 3 unspecified: Secondary | ICD-10-CM | POA: Diagnosis not present

## 2019-06-01 DIAGNOSIS — N390 Urinary tract infection, site not specified: Secondary | ICD-10-CM | POA: Diagnosis not present

## 2019-06-02 DIAGNOSIS — N390 Urinary tract infection, site not specified: Secondary | ICD-10-CM | POA: Diagnosis not present

## 2019-06-02 DIAGNOSIS — I129 Hypertensive chronic kidney disease with stage 1 through stage 4 chronic kidney disease, or unspecified chronic kidney disease: Secondary | ICD-10-CM | POA: Diagnosis not present

## 2019-06-02 DIAGNOSIS — N183 Chronic kidney disease, stage 3 unspecified: Secondary | ICD-10-CM | POA: Diagnosis not present

## 2019-06-02 DIAGNOSIS — I480 Paroxysmal atrial fibrillation: Secondary | ICD-10-CM | POA: Diagnosis not present

## 2019-06-02 DIAGNOSIS — E039 Hypothyroidism, unspecified: Secondary | ICD-10-CM | POA: Diagnosis not present

## 2019-06-02 DIAGNOSIS — E871 Hypo-osmolality and hyponatremia: Secondary | ICD-10-CM | POA: Diagnosis not present

## 2019-06-03 DIAGNOSIS — I480 Paroxysmal atrial fibrillation: Secondary | ICD-10-CM | POA: Diagnosis not present

## 2019-06-03 DIAGNOSIS — I129 Hypertensive chronic kidney disease with stage 1 through stage 4 chronic kidney disease, or unspecified chronic kidney disease: Secondary | ICD-10-CM | POA: Diagnosis not present

## 2019-06-03 DIAGNOSIS — N390 Urinary tract infection, site not specified: Secondary | ICD-10-CM | POA: Diagnosis not present

## 2019-06-03 DIAGNOSIS — E871 Hypo-osmolality and hyponatremia: Secondary | ICD-10-CM | POA: Diagnosis not present

## 2019-06-03 DIAGNOSIS — E039 Hypothyroidism, unspecified: Secondary | ICD-10-CM | POA: Diagnosis not present

## 2019-06-03 DIAGNOSIS — N183 Chronic kidney disease, stage 3 unspecified: Secondary | ICD-10-CM | POA: Diagnosis not present

## 2019-06-04 ENCOUNTER — Telehealth: Payer: Self-pay | Admitting: *Deleted

## 2019-06-04 NOTE — Telephone Encounter (Signed)
Called and left a voicemail for patient's daughter, [name], to schedule a Palliative care home visit. Left contact information for return call.

## 2019-06-07 DIAGNOSIS — E871 Hypo-osmolality and hyponatremia: Secondary | ICD-10-CM | POA: Diagnosis not present

## 2019-06-07 DIAGNOSIS — I480 Paroxysmal atrial fibrillation: Secondary | ICD-10-CM | POA: Diagnosis not present

## 2019-06-07 DIAGNOSIS — N183 Chronic kidney disease, stage 3 unspecified: Secondary | ICD-10-CM | POA: Diagnosis not present

## 2019-06-07 DIAGNOSIS — N390 Urinary tract infection, site not specified: Secondary | ICD-10-CM | POA: Diagnosis not present

## 2019-06-07 DIAGNOSIS — I129 Hypertensive chronic kidney disease with stage 1 through stage 4 chronic kidney disease, or unspecified chronic kidney disease: Secondary | ICD-10-CM | POA: Diagnosis not present

## 2019-06-07 DIAGNOSIS — E039 Hypothyroidism, unspecified: Secondary | ICD-10-CM | POA: Diagnosis not present

## 2019-06-08 ENCOUNTER — Telehealth: Payer: Self-pay | Admitting: *Deleted

## 2019-06-08 DIAGNOSIS — E039 Hypothyroidism, unspecified: Secondary | ICD-10-CM | POA: Diagnosis not present

## 2019-06-08 DIAGNOSIS — N390 Urinary tract infection, site not specified: Secondary | ICD-10-CM | POA: Diagnosis not present

## 2019-06-08 DIAGNOSIS — I129 Hypertensive chronic kidney disease with stage 1 through stage 4 chronic kidney disease, or unspecified chronic kidney disease: Secondary | ICD-10-CM | POA: Diagnosis not present

## 2019-06-08 DIAGNOSIS — I480 Paroxysmal atrial fibrillation: Secondary | ICD-10-CM | POA: Diagnosis not present

## 2019-06-08 DIAGNOSIS — E871 Hypo-osmolality and hyponatremia: Secondary | ICD-10-CM | POA: Diagnosis not present

## 2019-06-08 DIAGNOSIS — N183 Chronic kidney disease, stage 3 unspecified: Secondary | ICD-10-CM | POA: Diagnosis not present

## 2019-06-08 NOTE — Telephone Encounter (Signed)
Received a return call from patient's daughter, [name], to arrange a Palliative care home visit. Visit scheduled for 06/11/19 at 1:00 pm.

## 2019-06-10 DIAGNOSIS — E871 Hypo-osmolality and hyponatremia: Secondary | ICD-10-CM | POA: Diagnosis not present

## 2019-06-10 DIAGNOSIS — I480 Paroxysmal atrial fibrillation: Secondary | ICD-10-CM | POA: Diagnosis not present

## 2019-06-10 DIAGNOSIS — E039 Hypothyroidism, unspecified: Secondary | ICD-10-CM | POA: Diagnosis not present

## 2019-06-10 DIAGNOSIS — I129 Hypertensive chronic kidney disease with stage 1 through stage 4 chronic kidney disease, or unspecified chronic kidney disease: Secondary | ICD-10-CM | POA: Diagnosis not present

## 2019-06-10 DIAGNOSIS — N183 Chronic kidney disease, stage 3 unspecified: Secondary | ICD-10-CM | POA: Diagnosis not present

## 2019-06-10 DIAGNOSIS — N390 Urinary tract infection, site not specified: Secondary | ICD-10-CM | POA: Diagnosis not present

## 2019-06-11 ENCOUNTER — Other Ambulatory Visit: Payer: Self-pay

## 2019-06-11 ENCOUNTER — Other Ambulatory Visit: Payer: Medicare Other | Admitting: *Deleted

## 2019-06-11 DIAGNOSIS — E039 Hypothyroidism, unspecified: Secondary | ICD-10-CM | POA: Diagnosis not present

## 2019-06-11 DIAGNOSIS — Z515 Encounter for palliative care: Secondary | ICD-10-CM

## 2019-06-11 DIAGNOSIS — I129 Hypertensive chronic kidney disease with stage 1 through stage 4 chronic kidney disease, or unspecified chronic kidney disease: Secondary | ICD-10-CM | POA: Diagnosis not present

## 2019-06-11 DIAGNOSIS — E871 Hypo-osmolality and hyponatremia: Secondary | ICD-10-CM | POA: Diagnosis not present

## 2019-06-11 DIAGNOSIS — N183 Chronic kidney disease, stage 3 unspecified: Secondary | ICD-10-CM | POA: Diagnosis not present

## 2019-06-11 DIAGNOSIS — I480 Paroxysmal atrial fibrillation: Secondary | ICD-10-CM | POA: Diagnosis not present

## 2019-06-11 DIAGNOSIS — N390 Urinary tract infection, site not specified: Secondary | ICD-10-CM | POA: Diagnosis not present

## 2019-06-15 DIAGNOSIS — N390 Urinary tract infection, site not specified: Secondary | ICD-10-CM | POA: Diagnosis not present

## 2019-06-15 DIAGNOSIS — I480 Paroxysmal atrial fibrillation: Secondary | ICD-10-CM | POA: Diagnosis not present

## 2019-06-15 DIAGNOSIS — N183 Chronic kidney disease, stage 3 unspecified: Secondary | ICD-10-CM | POA: Diagnosis not present

## 2019-06-15 DIAGNOSIS — I129 Hypertensive chronic kidney disease with stage 1 through stage 4 chronic kidney disease, or unspecified chronic kidney disease: Secondary | ICD-10-CM | POA: Diagnosis not present

## 2019-06-15 DIAGNOSIS — E039 Hypothyroidism, unspecified: Secondary | ICD-10-CM | POA: Diagnosis not present

## 2019-06-15 DIAGNOSIS — E871 Hypo-osmolality and hyponatremia: Secondary | ICD-10-CM | POA: Diagnosis not present

## 2019-06-16 NOTE — Progress Notes (Signed)
COMMUNITY PALLIATIVE CARE RN NOTE  PATIENT NAME: [name] DOB: [birth year] MRN: [id number]  PRIMARY CARE PROVIDER: [name], MD  RESPONSIBLE PARTY: Viona Gilmore (daughter) Acct ID - Guarantor Home Phone Work Phone Relationship Acct Type  [id number] KAWTHAR, ENNEN* [phone]  Self P/F     [id number] [place], [place] [address]   Covid-19 Pre-screening Negative  PLAN OF CARE and INTERVENTION:  1. ADVANCE CARE PLANNING/GOALS OF CARE: Goal is for patient to be able to transfer to the bedside commode. She is a Full code. 2. PATIENT/CAREGIVER EDUCATION: Explained more about palliative services, symptom management, safe mobility/transfers 3. DISEASE STATUS: Met with patient and her daughter in their home. Daughter was able to provide patient's health history. Patient just recently moved in with her daughter in January of this year. She used to live at [facility] for several years. She was in [facility] skilled facility for rehab from July 2020 to September 2020. Upon arrival, patient is lying in bed awake and alert. She is able to engage in appropriate conversation, but does have some word-finding difficulties. She is currently receiving PT and ST through [facility]. She had a therapy session this morning and she was able to stand at the kitchen sink with assistance and take a few steps with her walker. Currently she is being transferred via [place] and transported via wheelchair. Patient would like to be able to transfer using a sliding board or stand and pivot with assistance to her bedside commode. PT has 3 more visits, then she will be re-evaluated. She requires full assistance also with bathing, dressing, toileting and repositioning in bed. She is able to feed herself independently. Speech therapy is helping patient to work through her word-finding issues and daughter feels this is helping and her speech is much clearer. She has been discharged from OT and exercises  given. She has hired caregivers through [facility] from 8a-2p. She is intermittently incontinent of both bowel and bladder. No skin issues and barrier cream being applied after incontinent episodes. She has a good appetite and eats 3 meals/day. She has gained weight. She takes her medications whole with water without difficulty. No dyspnea noted. She is sleeping ok. She denies pain at this time, but does report some mild pain in her left arm on occasion with movement. She has been c/o this ever since she received her last Covid-19 vaccination a few months ago. Will continue to monitor.    HISTORY OF PRESENT ILLNESS:  This is a 86 yo female who resides at home with her daughter. Palliative care team continues to follow patient and visits monthly and PRN.  CODE STATUS: Full code ADVANCED DIRECTIVES: Y MOST FORM: no PPS: 30%   PHYSICAL EXAM:   LUNGS: clear to auscultation  CARDIAC: Cor RRR EXTREMITIES: No edema SKIN: Exposed skin is dry and intact; family denies any skin issues  NEURO: Alert and oriented x 2 (person/place), forgetful, word-finding difficulties, generalized weakness, transferred via [place]   (Duration of visit and documentation 90 minutes)   Daryl Eastern, RN BSN

## 2019-06-17 DIAGNOSIS — N183 Chronic kidney disease, stage 3 unspecified: Secondary | ICD-10-CM | POA: Diagnosis not present

## 2019-06-17 DIAGNOSIS — E039 Hypothyroidism, unspecified: Secondary | ICD-10-CM | POA: Diagnosis not present

## 2019-06-17 DIAGNOSIS — E871 Hypo-osmolality and hyponatremia: Secondary | ICD-10-CM | POA: Diagnosis not present

## 2019-06-17 DIAGNOSIS — I129 Hypertensive chronic kidney disease with stage 1 through stage 4 chronic kidney disease, or unspecified chronic kidney disease: Secondary | ICD-10-CM | POA: Diagnosis not present

## 2019-06-17 DIAGNOSIS — I480 Paroxysmal atrial fibrillation: Secondary | ICD-10-CM | POA: Diagnosis not present

## 2019-06-17 DIAGNOSIS — N390 Urinary tract infection, site not specified: Secondary | ICD-10-CM | POA: Diagnosis not present

## 2019-06-18 DIAGNOSIS — E871 Hypo-osmolality and hyponatremia: Secondary | ICD-10-CM | POA: Diagnosis not present

## 2019-06-18 DIAGNOSIS — N183 Chronic kidney disease, stage 3 unspecified: Secondary | ICD-10-CM | POA: Diagnosis not present

## 2019-06-18 DIAGNOSIS — E039 Hypothyroidism, unspecified: Secondary | ICD-10-CM | POA: Diagnosis not present

## 2019-06-18 DIAGNOSIS — N390 Urinary tract infection, site not specified: Secondary | ICD-10-CM | POA: Diagnosis not present

## 2019-06-18 DIAGNOSIS — I480 Paroxysmal atrial fibrillation: Secondary | ICD-10-CM | POA: Diagnosis not present

## 2019-06-18 DIAGNOSIS — I129 Hypertensive chronic kidney disease with stage 1 through stage 4 chronic kidney disease, or unspecified chronic kidney disease: Secondary | ICD-10-CM | POA: Diagnosis not present

## 2019-06-21 DIAGNOSIS — N390 Urinary tract infection, site not specified: Secondary | ICD-10-CM | POA: Diagnosis not present

## 2019-06-21 DIAGNOSIS — N183 Chronic kidney disease, stage 3 unspecified: Secondary | ICD-10-CM | POA: Diagnosis not present

## 2019-06-21 DIAGNOSIS — I129 Hypertensive chronic kidney disease with stage 1 through stage 4 chronic kidney disease, or unspecified chronic kidney disease: Secondary | ICD-10-CM | POA: Diagnosis not present

## 2019-06-21 DIAGNOSIS — E039 Hypothyroidism, unspecified: Secondary | ICD-10-CM | POA: Diagnosis not present

## 2019-06-21 DIAGNOSIS — E871 Hypo-osmolality and hyponatremia: Secondary | ICD-10-CM | POA: Diagnosis not present

## 2019-06-21 DIAGNOSIS — I480 Paroxysmal atrial fibrillation: Secondary | ICD-10-CM | POA: Diagnosis not present

## 2019-06-23 DIAGNOSIS — I480 Paroxysmal atrial fibrillation: Secondary | ICD-10-CM | POA: Diagnosis not present

## 2019-06-23 DIAGNOSIS — N183 Chronic kidney disease, stage 3 unspecified: Secondary | ICD-10-CM | POA: Diagnosis not present

## 2019-06-23 DIAGNOSIS — E871 Hypo-osmolality and hyponatremia: Secondary | ICD-10-CM | POA: Diagnosis not present

## 2019-06-23 DIAGNOSIS — E039 Hypothyroidism, unspecified: Secondary | ICD-10-CM | POA: Diagnosis not present

## 2019-06-23 DIAGNOSIS — N390 Urinary tract infection, site not specified: Secondary | ICD-10-CM | POA: Diagnosis not present

## 2019-06-23 DIAGNOSIS — I129 Hypertensive chronic kidney disease with stage 1 through stage 4 chronic kidney disease, or unspecified chronic kidney disease: Secondary | ICD-10-CM | POA: Diagnosis not present

## 2019-07-05 DIAGNOSIS — I480 Paroxysmal atrial fibrillation: Secondary | ICD-10-CM | POA: Diagnosis not present

## 2019-07-05 DIAGNOSIS — N183 Chronic kidney disease, stage 3 unspecified: Secondary | ICD-10-CM | POA: Diagnosis not present

## 2019-07-05 DIAGNOSIS — F0390 Unspecified dementia without behavioral disturbance: Secondary | ICD-10-CM | POA: Diagnosis not present

## 2019-07-05 DIAGNOSIS — F324 Major depressive disorder, single episode, in partial remission: Secondary | ICD-10-CM | POA: Diagnosis not present

## 2019-07-05 DIAGNOSIS — Z7901 Long term (current) use of anticoagulants: Secondary | ICD-10-CM | POA: Diagnosis not present

## 2019-07-05 DIAGNOSIS — E785 Hyperlipidemia, unspecified: Secondary | ICD-10-CM | POA: Diagnosis not present

## 2019-07-05 DIAGNOSIS — L299 Pruritus, unspecified: Secondary | ICD-10-CM | POA: Diagnosis not present

## 2019-07-05 DIAGNOSIS — H8110 Benign paroxysmal vertigo, unspecified ear: Secondary | ICD-10-CM | POA: Diagnosis not present

## 2019-07-05 DIAGNOSIS — E039 Hypothyroidism, unspecified: Secondary | ICD-10-CM | POA: Diagnosis not present

## 2019-07-05 DIAGNOSIS — E559 Vitamin D deficiency, unspecified: Secondary | ICD-10-CM | POA: Diagnosis not present

## 2019-07-05 DIAGNOSIS — N390 Urinary tract infection, site not specified: Secondary | ICD-10-CM | POA: Diagnosis not present

## 2019-07-08 DIAGNOSIS — H401133 Primary open-angle glaucoma, bilateral, severe stage: Secondary | ICD-10-CM | POA: Diagnosis not present

## 2019-07-12 DIAGNOSIS — Z87440 Personal history of urinary (tract) infections: Secondary | ICD-10-CM | POA: Diagnosis not present

## 2019-08-24 ENCOUNTER — Other Ambulatory Visit: Payer: Self-pay

## 2019-08-24 ENCOUNTER — Encounter: Payer: Self-pay | Admitting: Podiatry

## 2019-08-24 ENCOUNTER — Ambulatory Visit (INDEPENDENT_AMBULATORY_CARE_PROVIDER_SITE_OTHER): Payer: Medicare Other | Admitting: Podiatry

## 2019-08-24 DIAGNOSIS — M79675 Pain in left toe(s): Secondary | ICD-10-CM

## 2019-08-24 DIAGNOSIS — L600 Ingrowing nail: Secondary | ICD-10-CM

## 2019-08-24 DIAGNOSIS — B351 Tinea unguium: Secondary | ICD-10-CM | POA: Diagnosis not present

## 2019-08-24 DIAGNOSIS — M79674 Pain in right toe(s): Secondary | ICD-10-CM

## 2019-08-24 NOTE — Patient Instructions (Signed)

## 2019-08-29 NOTE — Progress Notes (Signed)
Subjective: [name] is a pleasant 86 y.o. female patient seen today at risk foot care with h/o clotting disorder and painful callus(es) right foot and painful mycotic toenails b/l that are difficult to trim. Pain interferes with ambulation. Aggravating factors include wearing enclosed shoe gear. Pain is relieved with periodic professional debridement.  Patient Active Problem List   Diagnosis Date Noted  . Right knee injury 04/27/2019  . Syncope and collapse 04/27/2019  . Chronic kidney disease   . Closed fracture of medial condyle of distal femur ([place]) 10/19/2018  . Cognitive impairment 10/31/2014  . TIA (transient ischemic attack) 12/03/2013  . UTI (lower urinary tract infection) 12/03/2013  . SIADH (syndrome of inappropriate ADH production) ([place]) 08/07/2013  . Hyponatremia 08/03/2013  . Back pain 08/03/2013  . Closed left arm fracture 08/03/2013  . Essential hypertension 04/07/2013  . PAF (paroxysmal atrial fibrillation) ([place]) 03/07/2013  . Chronic anticoagulation 03/07/2013  . Obesity, unspecified 03/07/2013  . Nausea 03/07/2013  . Hypothyroidism 03/07/2013  . Bradycardia 03/06/2013  . Dermatophytosis of nail 12/30/2012  . 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 Unspecified cerebral artery occlusion with cerebral infarction   . Unspecified deficiency anemia 10/21/2012  . Postherpetic trigeminal neuralgia 10/20/2012    Current Outpatient Medications on File Prior to Visit  Medication Sig Dispense Refill  . carbamazepine (CARBATROL) 100 MG 12 hr capsule Take 1 capsule (100 mg total) by mouth 2 (two) times daily. 60 capsule 5  . cephALEXin (KEFLEX) 250 MG capsule Take 250 mg by mouth daily.    . cholecalciferol (VITAMIN D) 1000 units tablet Take 2,000 Units by mouth daily.     Marland Kitchen ELIQUIS 5 MG TABS tablet Take 5 mg by mouth 2 (two) times daily.    Marland Kitchen EPINEPHrine (EPIPEN 2-PAK) 0.3 mg/0.3 mL IJ SOAJ injection Inject 0.3 mg into the muscle as needed for  anaphylaxis.    . feeding supplement, ENSURE ENLIVE, (ENSURE ENLIVE) LIQD Take 237 mLs by mouth daily at 3 pm. (Patient not taking: Reported on 04/26/2019) 237 mL 12  . fluticasone (FLONASE) 50 MCG/ACT nasal spray Place 1 spray into left nostril daily as needed for allergies or rhinitis.   12  . folic acid (FOLVITE) 1 MG tablet Take 1 mg by mouth daily.     Marland Kitchen gabapentin (NEURONTIN) 300 MG capsule Take 1 capsule (300 mg total) by mouth 2 (two) times daily. 60 capsule 5  . levothyroxine (SYNTHROID, LEVOTHROID) 75 MCG tablet Take 75 mcg by mouth daily before breakfast.     . loperamide (IMODIUM A-D) 2 MG tablet Take 2 mg by mouth 4 (four) times daily as needed for diarrhea or loose stools.    Marland Kitchen loratadine (CLARITIN) 10 MG tablet Take 10 mg by mouth daily.    . Memantine HCl-Donepezil HCl (NAMZARIC) 28-10 MG CP24 Take 1 capsule by mouth at bedtime. 30 capsule 5  . MONUROL 3 g PACK     . Multiple Vitamin (MULTIVITAMIN WITH MINERALS) TABS tablet Take 1 tablet by mouth daily.    Marland Kitchen nystatin (MYCOSTATIN/NYSTOP) powder APPLY powder EXTERNALLY 2 TIMES DAILY AS NEEDED    . pravastatin (PRAVACHOL) 40 MG tablet Take 40 mg by mouth at bedtime.     Marland Kitchen ROCKLATAN 0.02-0.005 % SOLN Place 1 drop into both eyes at bedtime.    . sodium chloride 1 G tablet Take 1 tablet (1 g total) by mouth 2 (two) times daily with a meal. (Patient not taking: Reported on 04/26/2019) 60 tablet 0  .  triamcinolone cream (KENALOG) 0.1 % apply TO THE AFFECTED AREA 2 TIMES DAILY AS NEEDED FOR TO RASH     No current facility-administered medications on file prior to visit.    Allergies  Allergen Reactions  . Benadryl [Diphenhydramine Hcl] Hives and Shortness Of Breath  . Eye Drops [Tetrahydrozoline Hcl] Other (See Comments)    Burning, itching, swelling  . Flu Virus Vaccine Shortness Of Breath  . Iohexol Hives and Shortness Of Breath     Code: HIVES, Desc: PT IS ALLERGIC TO IVP DYE/IODINE/SHRIMP.  CAUSES SOB AND HIVES PER PT.  STEPHANIE,  RT-R, Onset Date: [id number]   . Shrimp [Shellfish Allergy] Anaphylaxis  . Voltaren [Diclofenac Sodium] Other (See Comments)    Acid reflux symptoms  . Ambien [Zolpidem Tartrate] Hives  . Gluten Meal Diarrhea  . Travatan Z [Travoprost] Other (See Comments)    BRAND NAME ONLY OLCANNOT TOLERATE GENERIC FORM OF EYE DROPS, IT CONTAINS PRESERVATIVES-SENSITIVITY  . Ativan [Lorazepam]     delusional  . Hydrocodone     Only on the higher dosages  . Betadine Antibiotic-Moisturize [Bacitracin-Polymyxin B] Rash  . Biaxin [Clarithromycin] Hives  . Celebrex [Celecoxib] Rash  . Ciprofloxacin Hcl Rash       . Combigan [Brimonidine Tartrate-Timolol] Other (See Comments)    unknown  . Coumadin [Warfarin Sodium] Rash  . Ivp Dye [Iodinated Diagnostic Agents] Hives  . Levaquin [Levofloxacin In D5w] Rash  . Naprosyn [Naproxen] Rash  . Pataday [Olopatadine Hcl] Other (See Comments)  . Penicillins Hives    Has patient had a PCN reaction causing immediate rash, facial/tongue/throat swelling, SOB or lightheadedness with hypotension: Yes Has patient had a PCN reaction causing severe rash involving mucus membranes or skin necrosis: No Has patient had a PCN reaction that required hospitalization No Has patient had a PCN reaction occurring within the last 10 years: No If all of the above answers are "NO", then may proceed with Cephalosporin use.   Marland Kitchen Plavix [Clopidogrel Bisulfate] Rash  . Septra [Sulfamethoxazole-Trimethoprim] Hives    Objective: Physical Exam  General: [name] is a pleasant 86 y.o. Caucasian female, in NAD. AAO x 3.   Vascular:  Neurovascular status unchanged b/l lower extremities. Capillary fill time to digits <3 seconds b/l lower extremities. Palpable DP pulses b/l. Palpable PT pulses b/l. Pedal hair sparse b/l. Skin temperature gradient within normal limits b/l. No pain with calf compression b/l.  Dermatological:  Pedal skin with normal turgor, texture and tone bilaterally.  No open wounds bilaterally. No interdigital macerations bilaterally. Toenails 1-5 b/l elongated, discolored, dystrophic, thickened, crumbly with subungual debris and tenderness to dorsal palpation. Callus resolved submet head 1 right foot.   Incurvated nailplate left great toe medial border(s) with tenderness to palpation. No erythema, no edema, no drainage noted. +Nail border hypertrophy.  Musculoskeletal:  Normal muscle strength 5/5 to all lower extremity muscle groups bilaterally. No pain crepitus or joint limitation noted with ROM b/l. No gross bony deformities bilaterally. Utilizes wheelchair for mobility assistance.  Neurological:  Protective sensation intact 5/5 intact bilaterally with 10g monofilament b/l. Vibratory sensation intact b/l.  Assessment and Plan:  1. Pain due to onychomycosis of toenails of both feet   2. Ingrown toenail without infection    -Examined patient. -Toenails 1-5 b/l were debrided in length and girth with sterile nail nippers and dremel without iatrogenic bleeding.  -Offending nail border debrided and curretaged L hallux utilizing sterile nail nipper and currette. Border(s) cleansed with alcohol and triple antibiotic ointment applied.  Patient instructed to apply triple antibiotic ointment to L hallux once daily for 14 days. -Patient to continue soft, supportive shoe gear daily. -Patient to report any pedal injuries to medical professional immediately. -Patient/POA to call should there be question/concern in the interim.  Return in about 3 months (around 11/24/2019) for nail and callus trim.  Marzetta Board, DPM

## 2019-10-04 DIAGNOSIS — I480 Paroxysmal atrial fibrillation: Secondary | ICD-10-CM | POA: Diagnosis not present

## 2019-10-04 DIAGNOSIS — I129 Hypertensive chronic kidney disease with stage 1 through stage 4 chronic kidney disease, or unspecified chronic kidney disease: Secondary | ICD-10-CM | POA: Diagnosis not present

## 2019-10-04 DIAGNOSIS — N183 Chronic kidney disease, stage 3 unspecified: Secondary | ICD-10-CM | POA: Diagnosis not present

## 2019-10-04 DIAGNOSIS — F0390 Unspecified dementia without behavioral disturbance: Secondary | ICD-10-CM | POA: Diagnosis not present

## 2019-10-04 DIAGNOSIS — D6869 Other thrombophilia: Secondary | ICD-10-CM | POA: Diagnosis not present

## 2019-10-04 DIAGNOSIS — R05 Cough: Secondary | ICD-10-CM | POA: Diagnosis not present

## 2019-10-27 DIAGNOSIS — H401133 Primary open-angle glaucoma, bilateral, severe stage: Secondary | ICD-10-CM | POA: Diagnosis not present

## 2019-11-03 ENCOUNTER — Other Ambulatory Visit: Payer: Self-pay | Admitting: Neurology

## 2019-11-04 ENCOUNTER — Other Ambulatory Visit: Payer: Self-pay

## 2019-11-04 ENCOUNTER — Other Ambulatory Visit: Payer: Medicare Other | Admitting: *Deleted

## 2019-11-04 ENCOUNTER — Other Ambulatory Visit: Payer: Medicare Other

## 2019-11-04 VITALS — BP 134/86 | HR 75 | Temp 98.5°F | Resp 20

## 2019-11-04 DIAGNOSIS — Z515 Encounter for palliative care: Secondary | ICD-10-CM

## 2019-11-09 NOTE — Progress Notes (Signed)
COMMUNITY PALLIATIVE CARE SW NOTE  PATIENT NAME: [name] DOB: [birth year] MRN: [id number]  PRIMARY CARE PROVIDER: [name], MD  RESPONSIBLE PARTY:  Acct ID - Guarantor Home Phone Work Phone Relationship Acct Type  [id number] NAIA, RUFF* [phone]  Self P/F     [address] [place], [place] [address]     PLAN OF CARE and INTERVENTIONS:             1. GOALS OF CARE/ ADVANCE CARE PLANNING:  Goal is for patient to remain in her daughter's home. Patient is  Now a DNR.  2. SOCIAL/EMOTIONAL/SPIRITUAL ASSESSMENT/ INTERVENTIONS:  SW and RN-Monishia Nadara Mustard completed a home visit with patient. She was present with her daughter-[name] and sitter. [name] updated the team on patient's status. Patient is bedbound. They use a hoyer lift to get up. Patient has sitters during the day. Her appetite is good. She has gained weight and difficult to roll her. Patient has been discharged from OT/PT due to lack of progress and fear of falling. Patient has no skin issues, but patient has some small bumps on her left arm. She is coughing more in the evenings a half hour after she eats. She is taking her medications with no problems. Patient's dementia has progressed where she has difficulty recalling words and calling things by different names. SW and RN visited patient in her room. Patient was alert and oriented, but had difficulty recalling words. Patient had difficulty wither her verbalizations, but team could follow. Patient engaged life review through the pictures in her room. Team discussed patient's goals of care and code status. Patient expressed that she did not want to CPR. Patient stated "When I go, I don't want to be brought back". A DNR was left in the home per request of PCG and patient. No other concerns were noted during this visit. SW provided supportive presence, education, active and reflective listening, engage patient in life review and DNR left in the home.  3. PATIENT/CAREGIVER EDUCATION/  COPING: Patient appears to be alert and oriented to self and situation. She seems to be coping well. Daughter [name] has a supportive family and is coping well. She is able to problem-solve and seek support when needed.  4. PERSONAL EMERGENCY PLAN:  EMS can be activated for emergencies. 5. COMMUNITY RESOURCES COORDINATION/ HEALTH CARE NAVIGATION:  Patient has private sitters through [facility] 6. FINANCIAL/LEGAL CONCERNS/INTERVENTIONS:  None     SOCIAL HX:  Social History   Tobacco Use  . Smoking status: Never Smoker  . Smokeless tobacco: Never Used  Substance Use Topics  . Alcohol use: No    Comment: very moderate, none  in the last two months    CODE STATUS: DNR ADVANCED DIRECTIVES: Yes MOST FORM COMPLETE:  No HOSPICE EDUCATION PROVIDED: No  PPS: Patient is bed bound and total care accept for feedings. She is alert and oriented to self.   Duration of visit and documentation: 60 minutes      Katheren Puller, LCSW

## 2019-11-17 NOTE — Progress Notes (Signed)
NEUROLOGY FOLLOW UP OFFICE NOTE  ANARI EVITT [id number]  HISTORY OF PRESENT ILLNESS: [name] is an 86 year old right-handed female with hypertension, CAD, atrial fibrillation, hypercholesterolemia, type 2 diabetes mellitus, migraine, anxiety, sleep apnea and history of stroke with residual right-sided weakness, DVT and PE (on Lovenox) who follows up for syncope, mixed Alzheimer's and vascular dementia and right post-herpetic trigeminal neuralgia. She is accompanied by her daughter who supplements history.   1. UPDATE: She is still living with her daughter and her family.  She has caregivers from 9 to 1 everyday and then she is cared for by family afterwards.  In wheelchair.  2.DEMENTIA: History: Memory deficits started around 2007. It was fairly sudden onset and has not progressed.She tends to repeat herself often, such as telling stories. She repeats questions often. She reports word-finding difficulties as well.She does have difficulty using the phone and managing her finances. She typically lives by herself in her one story house. She manages her own finances. She doesn't drive 2 to remote history of blackout spells. She typically does not have problems recognizing people or recalling names. She denies any depression. She denies any hallucinations. She does have history of vivid nightmares.She reports a maternal aunt with history of dementia. To evaluate cognitive problems, an MRI of the brain was performed on 12/01/13, which revealed extensive small vessel ischemic changes throughout the deep and subcortical white matter, including remote small infarcts involving the cerebellum and left basal ganglia.Chronic micro-hemorrhages also noted, most notably in the right frontal lobe. No acute findings noted.  In 2020, she has had a decline in memory due to recurrent UTIs and a fall in which she fractured her femur.  For several years, she has episodes of recurrent speech  hesitancy.She knows what she wants to say but has trouble getting the words out.In the past, it has occurred in setting of UTI.To evaluate episodes of transient speech dysfunction, EEG was performed on 03/10/14, which was normal.She went to speech therapy without any real improvement. Therapist thinks it is likely medication-related.  She underwent neuropsychological testing on 01/12/15. Testing was consistent with mild dementia. She exhibited cognitive dysfunction involving the medial-temporal lobe, which is suggestive of Alzheimer's disease. However, family endorses abrupt onset, which correlates with vascular etiology. Repeat MRI of the brain performed 02/22/15, stable compared to prior imaging from 12/01/13. She had repeat neurocognitive testing on 01/02/16. Cognitive deficits/mild dementia stable compared to one year ago.  Update: She takes Namzaric.Memory and word-finding difficulty is worse.    3RIGHT-SIDED POST-HERPETIC TRIGEMINAL NEURALGIA: History: Onset of symptoms started in 2006, following episode of herpes zoster involving she describes sharp pain involving the the right V1 and V2 distribution, associated with allodynia.Chewing, talking and brushing her teeth triggers and aggravates the pain.She was subsequently treated for postherpetic neuralgia.   Update: Current medication:Carbamazepine 100mg  twice daily,gabapentin 300mg  twice daily.  Every now and then she has a paroxysmal shooting pain above the right eye.    4.  SYNCOPE: History: She had a couple of episodes of syncope that occurred while transferring from her bed to the chair.  There was associated pain during these transfers.    When she was transferred to the wheelchair, her head rolled over with eyes open, tongue hanging out but not responsive to verbal stimuli.  She had urinary incontinence.  No associated convulsive movement.  She She was admitted to [facility] on 04/26/2019 after  another syncopal event in setting of transfer from bed to wheelchair.  She steadily returned to baseline after 3 to 5 minutes.  A similar event happened before during transfer from bed to chair.  Orthostatic vitals were unremarkable.  CT head showed no acute intracranial abnormality.  MRI of brain without contrast showed stable atrophy, chronic small vessel ischemic changes with chronic microhemorrhages and remote lacunar infarcts in cerebellum and basal ganglia but no new or acute abnormality.  MRA of head showed no large vessel stenosis or occlusion.  Echocardiogram showed EF 60-65% with no cardiac source of embolus.  Carotid ultrasound showed 40-59% right ICA stenosis, 1-39% left ICA stenosis and antegrade flow in both vertebral arteries.  Vasovagal syncope was suspected.  Since then, [place] has been involved.  She is receiving PT, OT and speech therapy.  There is an aid that helps her with bathing.  She now has a [employer] fast streamer to make transfer easier.    Update:  EEG on 05/26/2019 showed diffuse background slowing.   PAST MEDICAL HISTORY: Past Medical History:  Diagnosis Date  . A-fib ([place])   . Acid reflux   . Anxiety   . Arthritis   . Chronic anticoagulation 03/07/2013   Lovenox 100 QD  . Chronic kidney disease    Stage III  . CVA (cerebral infarction) 1964  . Dementia [facility])    Dr Brett Fairy  . Diverticulosis   . DVT (deep venous thrombosis) ([place]) 2007   recurrent w PE s/p [place] filter  . GERD (gastroesophageal reflux disease)    w hiatal hernia endoscopy 2003, Dr Penelope Coop  . Glaucoma   . Hypercholesterolemia   . Hypertension   . Hypoglycemia   . Hypothyroidism    goiter  . IBS (irritable bowel syndrome)   . Junctional bradycardia    resolved with discontinuation of sotalol (2014) after 10 yrs  . Knee injury   . Memory loss   . Migraine   . Osteoarthritis    in Bilateral knees and back. -Dr Latanya Maudlin  . Osteoporosis   . Panic attacks   . Phlebitis   .  Postherpetic trigeminal neuralgia 10/20/2012  . Pulmonary embolism recurrent    IVC filter  . Shingles    zoster 1990 on the back and on the abdomen in 2007,facial zoster 2006  . Trigeminal neuralgia    r face--Dr Dohmeier  . Unspecified cerebral artery occlusion with cerebral infarction   . Vitamin D deficiency     MEDICATIONS: Current Outpatient Medications on File Prior to Visit  Medication Sig Dispense Refill  . carbamazepine (CARBATROL) 100 MG 12 hr capsule Take 1 capsule (100 mg total) by mouth 2 (two) times daily. 60 capsule 5  . cephALEXin (KEFLEX) 250 MG capsule Take 250 mg by mouth daily.    . cholecalciferol (VITAMIN D) 1000 units tablet Take 2,000 Units by mouth daily.     Marland Kitchen ELIQUIS 5 MG TABS tablet Take 5 mg by mouth 2 (two) times daily.    Marland Kitchen EPINEPHrine (EPIPEN 2-PAK) 0.3 mg/0.3 mL IJ SOAJ injection Inject 0.3 mg into the muscle as needed for anaphylaxis.    . feeding supplement, ENSURE ENLIVE, (ENSURE ENLIVE) LIQD Take 237 mLs by mouth daily at 3 pm. (Patient not taking: Reported on 04/26/2019) 237 mL 12  . fluticasone (FLONASE) 50 MCG/ACT nasal spray Place 1 spray into left nostril daily as needed for allergies or rhinitis.   12  . folic acid (FOLVITE) 1 MG tablet Take 1 mg by mouth daily.     Marland Kitchen gabapentin (NEURONTIN) 300 MG  capsule Take 1 capsule (300 mg total) by mouth 2 (two) times daily. 60 capsule 5  . levothyroxine (SYNTHROID, LEVOTHROID) 75 MCG tablet Take 75 mcg by mouth daily before breakfast.     . loperamide (IMODIUM A-D) 2 MG tablet Take 2 mg by mouth 4 (four) times daily as needed for diarrhea or loose stools.    Marland Kitchen loratadine (CLARITIN) 10 MG tablet Take 10 mg by mouth daily.    Marland Kitchen MONUROL 3 g PACK     . Multiple Vitamin (MULTIVITAMIN WITH MINERALS) TABS tablet Take 1 tablet by mouth daily.    Marland Kitchen NAMZARIC 28-10 MG CP24 Take 1 capsule by mouth at bedtime. 30 capsule 5  . nystatin (MYCOSTATIN/NYSTOP) powder APPLY powder EXTERNALLY 2 TIMES DAILY AS NEEDED    .  pravastatin (PRAVACHOL) 40 MG tablet Take 40 mg by mouth at bedtime.     Marland Kitchen ROCKLATAN 0.02-0.005 % SOLN Place 1 drop into both eyes at bedtime.    . sodium chloride 1 G tablet Take 1 tablet (1 g total) by mouth 2 (two) times daily with a meal. (Patient not taking: Reported on 04/26/2019) 60 tablet 0  . triamcinolone cream (KENALOG) 0.1 % apply TO THE AFFECTED AREA 2 TIMES DAILY AS NEEDED FOR TO RASH     No current facility-administered medications on file prior to visit.    ALLERGIES: Allergies  Allergen Reactions  . Benadryl [Diphenhydramine Hcl] Hives and Shortness Of Breath  . Eye Drops [Tetrahydrozoline Hcl] Other (See Comments)    Burning, itching, swelling  . Flu Virus Vaccine Shortness Of Breath  . Iohexol Hives and Shortness Of Breath     Code: HIVES, Desc: PT IS ALLERGIC TO IVP DYE/IODINE/SHRIMP.  CAUSES SOB AND HIVES PER PT.  STEPHANIE, RT-R, Onset Date: [id number]   . Shrimp [Shellfish Allergy] Anaphylaxis  . Voltaren [Diclofenac Sodium] Other (See Comments)    Acid reflux symptoms  . Ambien [Zolpidem Tartrate] Hives  . Gluten Meal Diarrhea  . Travatan Z [Travoprost] Other (See Comments)    BRAND NAME ONLY OLCANNOT TOLERATE GENERIC FORM OF EYE DROPS, IT CONTAINS PRESERVATIVES-SENSITIVITY  . Ativan [Lorazepam]     delusional  . Hydrocodone     Only on the higher dosages  . Betadine Antibiotic-Moisturize [Bacitracin-Polymyxin B] Rash  . Biaxin [Clarithromycin] Hives  . Celebrex [Celecoxib] Rash  . Ciprofloxacin Hcl Rash       . Combigan [Brimonidine Tartrate-Timolol] Other (See Comments)    unknown  . Coumadin [Warfarin Sodium] Rash  . Ivp Dye [Iodinated Diagnostic Agents] Hives  . Levaquin [Levofloxacin In D5w] Rash  . Naprosyn [Naproxen] Rash  . Pataday [Olopatadine Hcl] Other (See Comments)  . Penicillins Hives    Has patient had a PCN reaction causing immediate rash, facial/tongue/throat swelling, SOB or lightheadedness with hypotension: Yes Has patient had a PCN  reaction causing severe rash involving mucus membranes or skin necrosis: No Has patient had a PCN reaction that required hospitalization No Has patient had a PCN reaction occurring within the last 10 years: No If all of the above answers are "NO", then may proceed with Cephalosporin use.   Marland Kitchen Plavix [Clopidogrel Bisulfate] Rash  . Septra [Sulfamethoxazole-Trimethoprim] Hives    FAMILY HISTORY: Family History  Problem Relation Age of Onset  . Diabetes Mother   . Hypertension Mother   . Glaucoma Mother   . CVA Mother   . CAD Mother   . Hypertension Father   . Heart attack Father   . Kidney failure Father   .  Heart attack Brother        2 half brothers  . Hypertension Brother   . CVA Brother   . Glaucoma Other   . Glaucoma Other   . Prostate cancer Maternal Grandfather   . Breast cancer Maternal Aunt   . Colon cancer Maternal Uncle   . Rectal cancer Maternal Uncle     SOCIAL HISTORY: Social History   Socioeconomic History  . Marital status: Divorced    Spouse name: Not on file  . Number of children: 3  . Years of education: 78  . Highest education level: Not on file  Occupational History  . 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 Smoking status: Never Smoker  . Smokeless tobacco: Never Used  Substance and Sexual Activity  . Alcohol use: No    Comment: very moderate, none  in the last two months  . Drug use: No  . Sexual activity: Never  Other Topics Concern  . Not on file  Social History Narrative   Patient is retired and lives at home alone. Patient has a college education.    Patient has three children.   Patient is right-handed.   Patient drinks 1-2 cups of caffeine daily.   Social Determinants of Health   Financial Resource Strain:   . Difficulty of Paying Living Expenses: Not on file  Food Insecurity:   . Worried About Charity fundraiser in the Last Year: Not on file  . Ran Out of Food in the  Last Year: Not on file  Transportation Needs:   . Lack of Transportation (Medical): Not on file  . Lack of Transportation (Non-Medical): Not on file  Physical Activity:   . Days of Exercise per Week: Not on file  . Minutes of Exercise per Session: Not on file  Stress:   . Feeling of Stress : Not on file  Social Connections:   . Frequency of Communication with Friends and Family: Not on file  . Frequency of Social Gatherings with Friends and Family: Not on file  . Attends Religious Services: Not on file  . Active Member of Clubs or Organizations: Not on file  . Attends Archivist Meetings: Not on file  . Marital Status: Not on file  Intimate Partner Violence:   . Fear of Current or Ex-Partner: Not on file  . Emotionally Abused: Not on file  . Physically Abused: Not on file  . Sexually Abused: Not on file    PHYSICAL EXAM: Blood pressure 126/64, pulse 86, height 5\' 2"  (1.575 m), weight 230 lb (104.3 kg), SpO2 95 %. General: No acute distress.  Patient appears well-groomed.   Head:  Normocephalic/atraumatic Eyes:  Fundi examined but not visualized Neck: supple, no paraspinal tenderness, full range of motion Heart:  Regular rate and rhythm Lungs:  Clear to auscultation bilaterally Back: No paraspinal tenderness Neurological Exam: alert and oriented to person, place and year. Attention span and concentration reduced, recent and remote memory impaired.  Speech fluent but with some word-finding difficulty and not dysarthric, language intact.  CN II-XII intact. Bulk and tone normal, muscle strength 4+/5 throughout.  Sensation to light touch, temperature and vibration intact.  Deep tendon reflexes 1+ throughout.  Finger to nose testing intact.  Gait deferred.  In wheelchair. IMPRESSION: 1.  Major neurocognitive disorder, likely mixed Alzheimer's and vascular.  2.  Right-sided postherpetic trigeminal neuralgia 3.  Vasovagal syncope  PLAN: 1.  Namzaric for dementia 2.   Carbamazepine 100mg  BID and gabapentin 300mg  BID for neuralgia 3.  Follow up in 6 months.  Metta Clines, DO  CC: [name], MD

## 2019-11-18 ENCOUNTER — Ambulatory Visit (INDEPENDENT_AMBULATORY_CARE_PROVIDER_SITE_OTHER): Payer: Medicare Other | Admitting: Neurology

## 2019-11-18 ENCOUNTER — Other Ambulatory Visit: Payer: Self-pay

## 2019-11-18 ENCOUNTER — Encounter: Payer: Self-pay | Admitting: Neurology

## 2019-11-18 VITALS — BP 126/64 | HR 86 | Ht 62.0 in | Wt 230.0 lb

## 2019-11-18 DIAGNOSIS — G309 Alzheimer's disease, unspecified: Secondary | ICD-10-CM | POA: Diagnosis not present

## 2019-11-18 DIAGNOSIS — B0222 Postherpetic trigeminal neuralgia: Secondary | ICD-10-CM

## 2019-11-18 DIAGNOSIS — F0150 Vascular dementia without behavioral disturbance: Secondary | ICD-10-CM | POA: Diagnosis not present

## 2019-11-18 DIAGNOSIS — F0280 Dementia in other diseases classified elsewhere without behavioral disturbance: Secondary | ICD-10-CM

## 2019-11-18 NOTE — Patient Instructions (Signed)
No change in medications at this time.  

## 2019-11-22 ENCOUNTER — Ambulatory Visit: Payer: Medicare Other | Admitting: Podiatry

## 2019-11-24 NOTE — Progress Notes (Signed)
COMMUNITY PALLIATIVE CARE RN NOTE  PATIENT NAME: [name] DOB: July 22, 1929 MRN: [id number]  PRIMARY CARE PROVIDER: [name], MD  RESPONSIBLE PARTY:  Acct ID - Guarantor Home Phone Work Phone Relationship Acct Type  [id number] DONI, BACHA* [phone]  Self P/F     [address] [place], [place] [address]   Covid-19 Pre-screening Negative  PLAN OF CARE and INTERVENTION:  1. ADVANCE CARE PLANNING/GOALS OF CARE: Goal is for patient to remain at home with her daughter and she wants to be able to walk again. She is now a DNR (2 copies left in the home per daughter's request) 2. PATIENT/CAREGIVER EDUCATION: Symptom management, safe transfers, s/s of infection 3. DISEASE STATUS: Joint visit made with Palliative care SW, M. Lonon. Met with patient, daughter and hired caregiver in the home. Daughter reports that patient's dementia is worsening and having more difficulty finding words. She is calling objects by different names and is more frustrated. She is having difficulty remembering family member's names, but does still recognize their faces. Respirations are even, regular and unlabored. She is c/o a cramping pain in her abdomen. She had a large BM this morning. May possibly be gas. Recommended trying Gas-X to see if this would help. She is total care with ADLs, but is able to feed herself. She is transferred via [place] and transported via wheelchair. She was ambulatory with a walker back in March of this year. Daughter feels that patient has a fear of falling, which is why she will not attempt to walk despite her goal of wanting to walk again. She does sit up in a recliner for a few hours per day. She enjoys reading. She is incontinent of both bowel and bladder. She states that patient has c/o pain with urination but briefs are not discolored or any foul odor. She has been pushing fluids and she administers a Cranberry supplement daily. She is sleeping ok at night and will take cat naps  during the day. Her intake is good. Daughter states that she is gaining weight and becoming more difficult to turn and reposition. She has a hired Actuary through [employer] that comes daily from 9a-1p to assist with bathing and dressing. Discussed with patient code status, and she was able to say that she does not want CPR. DNR forms left in the home. Will continue to monitor.   HISTORY OF PRESENT ILLNESS: This is a 86 yo female with a diagnosis of dementia. She resides in the home with her daughter. Palliative care team continues to follow patient. Will visit monthly and PRN.   CODE STATUS: DNR ADVANCED DIRECTIVES: Y MOST FORM: no PPS: 30%   PHYSICAL EXAM:   VITALS: Today's Vitals   11/04/19 1143  BP: 134/86  Pulse: 75  Resp: 20  Temp: 98.5 F (36.9 C)  TempSrc: Temporal  SpO2: 95%  PainSc: 2   PainLoc: Abdomen    LUNGS: clear to auscultation  CARDIAC: Cor RRR EXTREMITIES: No edema SKIN: tiny bumps on L arm (using steriod cream)  NEURO: Alert and oriented x 2, some expressive aphasia, pleasant mood, generalized weakness, non-ambulatory    (Duration of visit and documentation 90 minutes)   Daryl Eastern, RN BSN

## 2019-12-07 ENCOUNTER — Encounter: Payer: Self-pay | Admitting: Podiatry

## 2019-12-07 ENCOUNTER — Other Ambulatory Visit: Payer: Self-pay

## 2019-12-07 ENCOUNTER — Ambulatory Visit (INDEPENDENT_AMBULATORY_CARE_PROVIDER_SITE_OTHER): Payer: Medicare Other | Admitting: Podiatry

## 2019-12-07 DIAGNOSIS — M79675 Pain in left toe(s): Secondary | ICD-10-CM | POA: Diagnosis not present

## 2019-12-07 DIAGNOSIS — B351 Tinea unguium: Secondary | ICD-10-CM

## 2019-12-07 DIAGNOSIS — D689 Coagulation defect, unspecified: Secondary | ICD-10-CM | POA: Diagnosis not present

## 2019-12-07 DIAGNOSIS — M79674 Pain in right toe(s): Secondary | ICD-10-CM | POA: Diagnosis not present

## 2019-12-07 DIAGNOSIS — M2041 Other hammer toe(s) (acquired), right foot: Secondary | ICD-10-CM

## 2019-12-07 DIAGNOSIS — M2042 Other hammer toe(s) (acquired), left foot: Secondary | ICD-10-CM

## 2019-12-07 DIAGNOSIS — L600 Ingrowing nail: Secondary | ICD-10-CM

## 2019-12-07 NOTE — Progress Notes (Signed)
Subjective: [name] presents today for at risk foot care with h/o clotting disorder and painful thick toenails that are difficult to trim. Pain interferes with ambulation. Aggravating factors include wearing enclosed shoe gear. Pain is relieved with periodic professional debridement.   Daughter is present during today's visit. They voice no new pedal concerns on today's visit.  [name], MD is patient's PCP. Last visit was 07/02/2019.  Past Medical History:  Diagnosis Date  . A-fib ([place])   . Acid reflux   . Anxiety   . Arthritis   . Chronic anticoagulation 03/07/2013   Lovenox 100 QD  . Chronic kidney disease    Stage III  . CVA (cerebral infarction) 1964  . Dementia [facility])    Dr Brett Fairy  . Diverticulosis   . DVT (deep venous thrombosis) ([place]) 2007   recurrent w PE s/p [place] filter  . GERD (gastroesophageal reflux disease)    w hiatal hernia endoscopy 2003, Dr Penelope Coop  . Glaucoma   . Hypercholesterolemia   . Hypertension   . Hypoglycemia   . Hypothyroidism    goiter  . IBS (irritable bowel syndrome)   . Junctional bradycardia    resolved with discontinuation of sotalol (2014) after 10 yrs  . Knee injury   . Memory loss   . Migraine   . Osteoarthritis    in Bilateral knees and back. -Dr Latanya Maudlin  . Osteoporosis   . Panic attacks   . Phlebitis   . Postherpetic trigeminal neuralgia 10/20/2012  . Pulmonary embolism recurrent    IVC filter  . Shingles    zoster 1990 on the back and on the abdomen in 2007,facial zoster 2006  . Trigeminal neuralgia    r face--Dr Dohmeier  . Unspecified cerebral artery occlusion with cerebral infarction   . Vitamin D deficiency     Patient Active Problem List   Diagnosis Date Noted  . Right knee injury 04/27/2019  . Syncope and collapse 04/27/2019  . Chronic kidney disease   . Closed fracture of medial condyle of distal femur ([place]) 10/19/2018  . Cognitive impairment 10/31/2014  . TIA (transient ischemic attack)  12/03/2013  . UTI (lower urinary tract infection) 12/03/2013  . SIADH (syndrome of inappropriate ADH production) ([place]) 08/07/2013  . Hyponatremia 08/03/2013  . Back pain 08/03/2013  . Closed left arm fracture 08/03/2013  . Essential hypertension 04/07/2013  . PAF (paroxysmal atrial fibrillation) ([place]) 03/07/2013  . Chronic anticoagulation 03/07/2013  . Obesity, unspecified 03/07/2013  . Nausea 03/07/2013  . Hypothyroidism 03/07/2013  . Bradycardia 03/06/2013  . Dermatophytosis of nail 12/30/2012  . 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 Unspecified cerebral artery occlusion with cerebral infarction   . Unspecified deficiency anemia 10/21/2012  . Postherpetic trigeminal neuralgia 10/20/2012    Past Surgical History:  Procedure Laterality Date  . APPENDECTOMY  1945  . biopsies of breast Bilateral 1971  . blood clots  06/2006   lower abdomen  . CARDIAC CATHETERIZATION  08/2001   A-fib/Palpitations Dr. Daneen Schick  . CHOLECYSTECTOMY  01/1983  . DILATION AND CURETTAGE, DIAGNOSTIC / THERAPEUTIC  1952/1977   x2  . FOOT SURGERY Right 1978   Cyst removed from Right Foot  . [place]  . [place]  . insertion of vena cova filter  2007  . LEG / ANKLE SOFT TISSUE BIOPSY Right    precancerous  . migraine speech impairment  07/1963  . phlebitis  12/2005  . PULMONARY EMBOLISM SURGERY  03/2005  . TONSILLECTOMY     1954    Current Outpatient Medications on File Prior to Visit  Medication Sig Dispense Refill  . carbamazepine (CARBATROL) 100 MG 12 hr capsule Take 1 capsule (100 mg total) by mouth 2 (two) times daily. 60 capsule 5  . cephALEXin (KEFLEX) 250 MG capsule Take 250 mg by mouth daily.    . cholecalciferol (VITAMIN D) 1000 units tablet Take 2,000 Units by mouth daily.     Marland Kitchen ELIQUIS 5 MG TABS tablet Take 5 mg by mouth 2 (two) times daily.    Marland Kitchen EPINEPHrine (EPIPEN 2-PAK) 0.3 mg/0.3 mL IJ SOAJ injection Inject 0.3 mg into the muscle  as needed for anaphylaxis.    . feeding supplement, ENSURE ENLIVE, (ENSURE ENLIVE) LIQD Take 237 mLs by mouth daily at 3 pm. (Patient not taking: Reported on 11/18/2019) 237 mL 12  . fluticasone (FLONASE) 50 MCG/ACT nasal spray Place 1 spray into left nostril daily as needed for allergies or rhinitis.   12  . folic acid (FOLVITE) 1 MG tablet Take 1 mg by mouth daily.     Marland Kitchen gabapentin (NEURONTIN) 300 MG capsule Take 1 capsule (300 mg total) by mouth 2 (two) times daily. 60 capsule 5  . levothyroxine (SYNTHROID, LEVOTHROID) 75 MCG tablet Take 75 mcg by mouth daily before breakfast.     . loperamide (IMODIUM A-D) 2 MG tablet Take 2 mg by mouth 4 (four) times daily as needed for diarrhea or loose stools.    Marland Kitchen loratadine (CLARITIN) 10 MG tablet Take 10 mg by mouth daily.    Marland Kitchen MONUROL 3 g PACK     . Multiple Vitamin (MULTIVITAMIN WITH MINERALS) TABS tablet Take 1 tablet by mouth daily.    Marland Kitchen NAMZARIC 28-10 MG CP24 Take 1 capsule by mouth at bedtime. 30 capsule 5  . nystatin (MYCOSTATIN/NYSTOP) powder APPLY powder EXTERNALLY 2 TIMES DAILY AS NEEDED    . pravastatin (PRAVACHOL) 40 MG tablet Take 40 mg by mouth at bedtime.     Marland Kitchen ROCKLATAN 0.02-0.005 % SOLN Place 1 drop into both eyes at bedtime.    . sodium chloride 1 G tablet Take 1 tablet (1 g total) by mouth 2 (two) times daily with a meal. 60 tablet 0  . triamcinolone cream (KENALOG) 0.1 % apply TO THE AFFECTED AREA 2 TIMES DAILY AS NEEDED FOR TO RASH     No current facility-administered medications on file prior to visit.     Allergies  Allergen Reactions  . Benadryl [Diphenhydramine Hcl] Hives and Shortness Of Breath  . Eye Drops [Tetrahydrozoline Hcl] Other (See Comments)    Burning, itching, swelling  . Flu Virus Vaccine Shortness Of Breath  . Iohexol Hives and Shortness Of Breath     Code: HIVES, Desc: PT IS ALLERGIC TO IVP DYE/IODINE/SHRIMP.  CAUSES SOB AND HIVES PER PT.  STEPHANIE, RT-R, Onset Date: [id number]   . Shrimp [Shellfish  Allergy] Anaphylaxis  . Voltaren [Diclofenac Sodium] Other (See Comments)    Acid reflux symptoms  . Ambien [Zolpidem Tartrate] Hives  . Gluten Meal Diarrhea  . Travatan Z [Travoprost] Other (See Comments)    BRAND NAME ONLY OLCANNOT TOLERATE GENERIC FORM OF EYE DROPS, IT CONTAINS PRESERVATIVES-SENSITIVITY  . Ativan [Lorazepam]     delusional  . Hydrocodone     Only on the higher dosages  . Betadine Antibiotic-Moisturize [Bacitracin-Polymyxin B] Rash  . Biaxin [Clarithromycin] Hives  . Celebrex [Celecoxib] Rash  . Ciprofloxacin Hcl Rash       .  Combigan [Brimonidine Tartrate-Timolol] Other (See Comments)    unknown  . Coumadin [Warfarin Sodium] Rash  . Ivp Dye [Iodinated Diagnostic Agents] Hives  . Levaquin [Levofloxacin In D5w] Rash  . Naprosyn [Naproxen] Rash  . Pataday [Olopatadine Hcl] Other (See Comments)  . Penicillins Hives    Has patient had a PCN reaction causing immediate rash, facial/tongue/throat swelling, SOB or lightheadedness with hypotension: Yes Has patient had a PCN reaction causing severe rash involving mucus membranes or skin necrosis: No Has patient had a PCN reaction that required hospitalization No Has patient had a PCN reaction occurring within the last 10 years: No If all of the above answers are "NO", then may proceed with Cephalosporin use.   Marland Kitchen Plavix [Clopidogrel Bisulfate] Rash  . Septra [Sulfamethoxazole-Trimethoprim] Hives    Social History   Occupational History  . 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 Smoking status: Never Smoker  . Smokeless tobacco: Never Used  Substance and Sexual Activity  . Alcohol use: No    Comment: very moderate, none  in the last two months  . Drug use: No  . Sexual activity: Never    Family History  Problem Relation Age of Onset  . Diabetes Mother   . Hypertension Mother   . Glaucoma Mother   . CVA Mother   . CAD Mother   . Hypertension Father    . Heart attack Father   . Kidney failure Father   . Heart attack Brother        2 half brothers  . Hypertension Brother   . CVA Brother   . Glaucoma Other   . Glaucoma Other   . Prostate cancer Maternal Grandfather   . Breast cancer Maternal Aunt   . Colon cancer Maternal Uncle   . Rectal cancer Maternal Uncle      There is no immunization history on file for this patient.   Objective: There were no vitals filed for this visit.  [name] is a pleasant 86 y.o. Caucasian female, obese, in NAD. AAO X 3.  Vascular Examination: Capillary fill time to digits <3 seconds b/l lower extremities. Palpable pedal pulses b/l LE. Pedal hair absent. Lower extremity skin temperature gradient within normal limits. No pain with calf compression b/l. Trace edema noted b/l lower extremities. No ischemia or gangrene noted b/l lower extremities.  Dermatological Examination: Pedal skin is thin shiny, atrophic b/l lower extremities. No open wounds bilaterally. No interdigital macerations bilaterally. Toenails 1-5 b/l elongated, discolored, dystrophic, thickened, crumbly with subungual debris and tenderness to dorsal palpation.  Musculoskeletal Examination: Normal muscle strength 5/5 to all lower extremity muscle groups bilaterally. No pain crepitus or joint limitation noted with ROM b/l. Hammertoes noted to the 2-5 bilaterally. Utilizes wheelchair for mobility assistance.  Footwear Assessment: Does the patient wear appropriate shoes? Yes. Does the patient need inserts/orthotics?  No.  Neurological Examination: Protective sensation intact 5/5 intact bilaterally with 10g monofilament b/l. Vibratory sensation diminished b/l. Clonus negative b/l.  Hemoglobin A1C Latest Ref Rng & Units 04/26/2019  HGBA1C 4.8 - 5.6 % 5.6  Some recent data might be hidden    Assessment: 1. Pain due to onychomycosis of toenails of both feet   2. Acquired hammertoes of both feet   3. Ingrown toenail without  infection   4. Blood clotting disorder ([place])     Plan: -Examined patient. -No new findings. No new orders. -Toenails 1-5 b/l were debrided  in length and girth with sterile nail nippers and dremel without iatrogenic bleeding.  -Offending nail border debrided and curretaged L hallux and R hallux utilizing sterile nail nipper and currette. Border(s) cleansed with alcohol and alcohol applied. Patient instructed to apply Neosporin with Pain Relief to L hallux and R hallux once daily for 3 days. -Patient to report any pedal injuries to medical professional immediately. -Patient to continue soft, supportive shoe gear daily. -Patient/POA to call should there be question/concern in the interim.  Return in about 3 months (around 03/07/2020).  Marzetta Board, DPM

## 2019-12-16 ENCOUNTER — Other Ambulatory Visit: Payer: Self-pay

## 2019-12-16 ENCOUNTER — Other Ambulatory Visit: Payer: Medicare Other

## 2019-12-16 DIAGNOSIS — Z515 Encounter for palliative care: Secondary | ICD-10-CM

## 2019-12-16 NOTE — Progress Notes (Signed)
Cardiology Office Note:    Date:  12/17/2019   ID:  TEANA LINDAHL, DOB 29-Feb-1930, MRN [id number]  PCP:  [name], MD  Cardiologist:  Sinclair Grooms, MD   Referring MD: [name], MD   Chief Complaint  Patient presents with  . Atrial Fibrillation    History of Present Illness:    [name] is a 86 y.o. female with a hx of paroxysmal atrial fibrillation, hypertension, history pulmonary emboli, IVC filter, venous insufficiency, history of dementia, history of CVA, and chronic Lovenox therapy.   Felt to likely be in atrial fibrillation when seen in July by Dr. [name].  Referred here for follow-up.  She has occasional brief palpitations but overall feels well.  She is not short of breath or having any decompensated cardiac complaints.  The patient is not complaining and otherwise feeling well other than as stated concerning intermittent palpitations.  No orthopnea, PND, or ankle swelling.  She is accompanied by her daughter.  Past Medical History:  Diagnosis Date  . A-fib ([place])   . Acid reflux   . Anxiety   . Arthritis   . Chronic anticoagulation 03/07/2013   Lovenox 100 QD  . Chronic kidney disease    Stage III  . CVA (cerebral infarction) 1964  . Dementia [facility])    Dr Brett Fairy  . Diverticulosis   . DVT (deep venous thrombosis) ([place]) 2007   recurrent w PE s/p [place] filter  . GERD (gastroesophageal reflux disease)    w hiatal hernia endoscopy 2003, Dr Penelope Coop  . Glaucoma   . Hypercholesterolemia   . Hypertension   . Hypoglycemia   . Hypothyroidism    goiter  . IBS (irritable bowel syndrome)   . Junctional bradycardia    resolved with discontinuation of sotalol (2014) after 10 yrs  . Knee injury   . Memory loss   . Migraine   . Osteoarthritis    in Bilateral knees and back. -Dr Latanya Maudlin  . Osteoporosis   . Panic attacks   . Phlebitis   . Postherpetic trigeminal neuralgia 10/20/2012  . Pulmonary embolism recurrent    IVC filter  .  Shingles    zoster 1990 on the back and on the abdomen in 2007,facial zoster 2006  . Trigeminal neuralgia    r face--Dr Dohmeier  . Unspecified cerebral artery occlusion with cerebral infarction   . Vitamin D deficiency     Past Surgical History:  Procedure Laterality Date  . APPENDECTOMY  1945  . biopsies of breast Bilateral 1971  . blood clots  06/2006   lower abdomen  . CARDIAC CATHETERIZATION  08/2001   A-fib/Palpitations Dr. Daneen Schick  . CHOLECYSTECTOMY  01/1983  . DILATION AND CURETTAGE, DIAGNOSTIC / THERAPEUTIC  1952/1977   x2  . FOOT SURGERY Right 1978   Cyst removed from Right Foot  . [place]  . [place]  . insertion of vena cova filter  2007  . LEG / ANKLE SOFT TISSUE BIOPSY Right    precancerous  . migraine speech impairment  07/1963  . phlebitis  12/2005  . PULMONARY EMBOLISM SURGERY  03/2005  . TONSILLECTOMY     1954    Current Medications: Current Meds  Medication Sig  . carbamazepine (CARBATROL) 100 MG 12 hr capsule Take 1 capsule (100 mg total) by mouth 2 (two) times daily.  . cephALEXin (KEFLEX) 250 MG capsule Take 250 mg by mouth daily.  . cholecalciferol (VITAMIN  D) 1000 units tablet Take 2,000 Units by mouth daily.   Marland Kitchen ELIQUIS 5 MG TABS tablet Take 5 mg by mouth 2 (two) times daily.  Marland Kitchen EPINEPHrine (EPIPEN 2-PAK) 0.3 mg/0.3 mL IJ SOAJ injection Inject 0.3 mg into the muscle as needed for anaphylaxis.  . feeding supplement, ENSURE ENLIVE, (ENSURE ENLIVE) LIQD Take 237 mLs by mouth daily at 3 pm.  . fluticasone (FLONASE) 50 MCG/ACT nasal spray Place 1 spray into left nostril daily as needed for allergies or rhinitis.   . folic acid (FOLVITE) 1 MG tablet Take 1 mg by mouth daily.   Marland Kitchen gabapentin (NEURONTIN) 300 MG capsule Take 1 capsule (300 mg total) by mouth 2 (two) times daily.  Marland Kitchen levothyroxine (SYNTHROID, LEVOTHROID) 75 MCG tablet Take 75 mcg by mouth daily before breakfast.   . loperamide (IMODIUM A-D) 2 MG tablet Take  2 mg by mouth 4 (four) times daily as needed for diarrhea or loose stools.  Marland Kitchen loratadine (CLARITIN) 10 MG tablet Take 10 mg by mouth daily.  Marland Kitchen MONUROL 3 g PACK   . Multiple Vitamin (MULTIVITAMIN WITH MINERALS) TABS tablet Take 1 tablet by mouth daily.  Marland Kitchen NAMZARIC 28-10 MG CP24 Take 1 capsule by mouth at bedtime.  Marland Kitchen nystatin (MYCOSTATIN/NYSTOP) powder APPLY powder EXTERNALLY 2 TIMES DAILY AS NEEDED  . pravastatin (PRAVACHOL) 40 MG tablet Take 20 mg by mouth at bedtime.   Marland Kitchen ROCKLATAN 0.02-0.005 % SOLN Place 1 drop into both eyes at bedtime.  . sodium chloride 1 G tablet Take 1 tablet (1 g total) by mouth 2 (two) times daily with a meal.  . triamcinolone cream (KENALOG) 0.1 % apply TO THE AFFECTED AREA 2 TIMES DAILY AS NEEDED FOR TO RASH     Allergies:   Benadryl [diphenhydramine hcl], Eye drops [tetrahydrozoline hcl], Flu virus vaccine, Iohexol, Shrimp [shellfish allergy], Voltaren [diclofenac sodium], Ambien [zolpidem tartrate], Gluten meal, Travatan z [travoprost], Ativan [lorazepam], Hydrocodone, Betadine antibiotic-moisturize [bacitracin-polymyxin b], Biaxin [clarithromycin], Celebrex [celecoxib], Ciprofloxacin hcl, Combigan [brimonidine tartrate-timolol], Coumadin [warfarin sodium], Ivp dye [iodinated diagnostic agents], Levaquin [levofloxacin in d5w], Naprosyn [naproxen], Pataday [olopatadine hcl], Penicillins, Plavix [clopidogrel bisulfate], and Septra [sulfamethoxazole-trimethoprim]   Social History   Socioeconomic History  . Marital status: Divorced    Spouse name: Not on file  . Number of children: 3  . Years of education: 25  . Highest education level: Not on file  Occupational History  . 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 Smoking status: Never Smoker  . Smokeless tobacco: Never Used  Substance and Sexual Activity  . Alcohol use: No    Comment: very moderate, none  in the last two months  . Drug use: No  . Sexual  activity: Never  Other Topics Concern  . Not on file  Social History Narrative   Patient is retired and lives at home alone. Patient has a college education.    Patient has three children.   Patient is right-handed.   Patient drinks 1-2 cups of caffeine daily.   Social Determinants of Health   Financial Resource Strain:   . Difficulty of Paying Living Expenses: Not on file  Food Insecurity:   . Worried About Charity fundraiser in the Last Year: Not on file  . Ran Out of Food in the Last Year: Not on file  Transportation Needs:   . Lack of Transportation (Medical): Not on file  . Lack of Transportation (Non-Medical): Not  on file  Physical Activity:   . Days of Exercise per Week: Not on file  . Minutes of Exercise per Session: Not on file  Stress:   . Feeling of Stress : Not on file  Social Connections:   . Frequency of Communication with Friends and Family: Not on file  . Frequency of Social Gatherings with Friends and Family: Not on file  . Attends Religious Services: Not on file  . Active Member of Clubs or Organizations: Not on file  . Attends Archivist Meetings: Not on file  . Marital Status: Not on file     Family History: The patient's family history includes Breast cancer in her maternal aunt; CAD in her mother; CVA in her brother and mother; Colon cancer in her maternal uncle; Diabetes in her mother; Glaucoma in her mother and other family members; Heart attack in her brother and father; Hypertension in her brother, father, and mother; Kidney failure in her father; Prostate cancer in her maternal grandfather; Rectal cancer in her maternal uncle.  ROS:   Please see the history of present illness.    Decreased memory, anxiety.  Anxiety is mostly concerned the possibility of CVA.  All other systems reviewed and are negative.  EKGs/Labs/Other Studies Reviewed:    The following studies were reviewed today: No new data  EKG:  EKG April 29, 2019 demonstrates  the QRS transition atrial fibrillation with controlled rate at 88 bpm.  Otherwise unremarkable.  Recent Labs: 04/26/2019: ALT 14; B Natriuretic Peptide 98.6; Hemoglobin 9.5; Platelets 153; TSH 1.345 04/28/2019: BUN 29; Creatinine, Ser 1.25; Potassium 4.1; Sodium 133  Recent Lipid Panel    Component Value Date/Time   CHOL 127 04/27/2019 0424   TRIG 32 04/27/2019 0424   HDL 59 04/27/2019 0424   CHOLHDL 2.2 04/27/2019 0424   VLDL 6 04/27/2019 0424   LDLCALC 62 04/27/2019 0424    Physical Exam:    VS:  BP 128/62   Pulse 95   Ht 5\' 2"  (1.575 m)   SpO2 95%   BMI 42.07 kg/m     Wt Readings from Last 3 Encounters:  11/18/19 230 lb (104.3 kg)  04/26/19 200 lb 2.8 oz (90.8 kg)  04/03/19 199 lb 15.3 oz (90.7 kg)     GEN: Elderly and morbidly obese. No acute distress HEENT: Normal NECK: No JVD. LYMPHATICS: No lymphadenopathy CARDIAC:  RRR without murmur, gallop, or edema. VASCULAR:  Normal Pulses. No bruits. RESPIRATORY:  Clear to auscultation without rales, wheezing or rhonchi  ABDOMEN: Soft, non-tender, non-distended, No pulsatile mass, MUSCULOSKELETAL: No deformity  SKIN: Warm and dry NEUROLOGIC:  Alert and oriented x 3 PSYCHIATRIC:  Normal affect   ASSESSMENT:    1. PAF (paroxysmal atrial fibrillation) ([place])   2. Essential hypertension   3. TIA (transient ischemic attack)   4. Syncope and collapse   5. Chronic anticoagulation   6. Educated about COVID-19 virus infection    PLAN:    In order of problems listed above:  1. In atrial fibrillation today.  Probably in atrial fibrillation with you saw Dr. Dema Severin.  A 3 to 7-day monitor will be done to determine if she is having intermittent A. fib or persistent A. fib.  This will also help to check for rate control.  She is on Eliquis 5 mg twice daily.  Will probably not be a candidate for electrical cardioversion given sedentary lifestyle. 2. Blood pressure control is reasonable 3. Eliquis is given protection against recurrent  stroke.  4. Recent syncope associated with UTI.  Monitor will help rule out tachybradycardia syndrome. 5. No bleeding complications on Eliquis.  Current dose is correct for age and body weight. 6. She has been vaccinated and is practicing social mitigation.   Medication Adjustments/Labs and Tests Ordered: Current medicines are reviewed at length with the patient today.  Concerns regarding medicines are outlined above.  No orders of the defined types were placed in this encounter.  No orders of the defined types were placed in this encounter.   There are no Patient Instructions on file for this visit.   Signed, Sinclair Grooms, MD  12/17/2019 2:46 PM    [place]

## 2019-12-17 ENCOUNTER — Encounter: Payer: Self-pay | Admitting: Interventional Cardiology

## 2019-12-17 ENCOUNTER — Other Ambulatory Visit: Payer: Self-pay

## 2019-12-17 ENCOUNTER — Ambulatory Visit (INDEPENDENT_AMBULATORY_CARE_PROVIDER_SITE_OTHER): Payer: Medicare Other | Admitting: Interventional Cardiology

## 2019-12-17 ENCOUNTER — Telehealth: Payer: Self-pay | Admitting: Radiology

## 2019-12-17 VITALS — BP 128/62 | HR 95 | Ht 62.0 in

## 2019-12-17 DIAGNOSIS — I10 Essential (primary) hypertension: Secondary | ICD-10-CM | POA: Diagnosis not present

## 2019-12-17 DIAGNOSIS — Z7901 Long term (current) use of anticoagulants: Secondary | ICD-10-CM

## 2019-12-17 DIAGNOSIS — G459 Transient cerebral ischemic attack, unspecified: Secondary | ICD-10-CM | POA: Diagnosis not present

## 2019-12-17 DIAGNOSIS — R55 Syncope and collapse: Secondary | ICD-10-CM

## 2019-12-17 DIAGNOSIS — Z7189 Other specified counseling: Secondary | ICD-10-CM | POA: Diagnosis not present

## 2019-12-17 DIAGNOSIS — I480 Paroxysmal atrial fibrillation: Secondary | ICD-10-CM | POA: Diagnosis not present

## 2019-12-17 NOTE — Patient Instructions (Signed)
Medication Instructions:  Your physician recommends that you continue on your current medications as directed. Please refer to the Current Medication list given to you today.  *If you need a refill on your cardiac medications before your next appointment, please call your pharmacy*   Lab Work: None If you have labs (blood work) drawn today and your tests are completely normal, you will receive your results only by: Marland Kitchen MyChart Message (if you have MyChart) OR . A paper copy in the mail If you have any lab test that is abnormal or we need to change your treatment, we will call you to review the results.   Testing/Procedures: Your physician recommends that you wear a monitor for 7 days.    Follow-Up: At [facility], you and your health needs are our priority.  As part of our continuing mission to provide you with exceptional heart care, we have created designated Provider Care Teams.  These Care Teams include your primary Cardiologist (physician) and Advanced Practice Providers (APPs -  Physician Assistants and Nurse Practitioners) who all work together to provide you with the care you need, when you need it.  We recommend signing up for the patient portal called "MyChart".  Sign up information is provided on this After Visit Summary.  MyChart is used to connect with patients for Virtual Visits (Telemedicine).  Patients are able to view lab/test results, encounter notes, upcoming appointments, etc.  Non-urgent messages can be sent to your provider as well.   To learn more about what you can do with MyChart, go to [email or web address].    Your next appointment:   12 month(s)  The format for your next appointment:   In Person  Provider:   You may see Sinclair Grooms, MD or one of the following Advanced Practice Providers on your designated Care Team:    Truitt Merle, NP  Cecilie Kicks, NP  Kathyrn Drown, NP    Other Instructions

## 2019-12-17 NOTE — Progress Notes (Signed)
COMMUNITY PALLIATIVE CARE SW NOTE  PATIENT NAME: [name] DOB: [birth year] MRN: [id number]  PRIMARY CARE PROVIDER: [name], MD  RESPONSIBLE PARTY:  Acct ID - Guarantor Home Phone Work Phone Relationship Acct Type  [id number] LUDENE, STOKKE* [phone]  Self P/F     [id number] [place], [place] [address]     PLAN OF CARE and INTERVENTIONS:             1. GOALS OF CARE/ ADVANCE CARE PLANNING:  Goal is for patient to remain in her daughter's home. Patient is  Now a DNR 2. SOCIAL/EMOTIONAL/SPIRITUAL ASSESSMENT/ INTERVENTIONS: SW completed a home visit with patient. She was present with her daughter-[name] and her daughter-in-law. Patient was sitting up in her recliner, awake, alert and oriented to self and situation. Patient denied pain, but report some intermittent pain to her leg. SW observed swelling in patient's right leg.  The family continues to use a hoyer lift to get her up.  Patient has sitters during the day, but has difficulty with getting sitters on the weekend. Her appetite remains good. [name] advised that they have not received the hospital bed that was ordered for patient she asked that RN follow-up. Patient has some reddened areas on her bottom that she is treating with Desitin ointment. She is scheduled to see her cardiologist and foot doctor this week. She has no med changes and no falls. SW observed patient leaning more to her right side and her daughter also notes this. She is verbally engaged, but had difficulty recalling words and events where she relied on her daughter to assist her. Patient affect was bright. She seemed to be receptive to SW presence. SW provided supportive presence, education, active and reflective listening, engage patient in life review and DNR left in the home. SW will update palliative care RN for follow-up. SW will continue to provide ongoing assessment of psychosocial needs and comfort of patient and provide support as  needed. 3. PATIENT/CAREGIVER EDUCATION/ COPING:  Patient is alert and oriented to self and situation. She has a supportive family and all appear to be coping well.  4. PERSONAL EMERGENCY PLAN:  911 can be activated for emergencies. 5. COMMUNITY RESOURCES COORDINATION/ HEALTH CARE NAVIGATION:  Patient has private sitters through [facility]. 6. FINANCIAL/LEGAL CONCERNS/INTERVENTIONS:  None.     SOCIAL HX:  Social History   Tobacco Use  . Smoking status: Never Smoker  . Smokeless tobacco: Never Used  Substance Use Topics  . Alcohol use: No    Comment: very moderate, none  in the last two months    CODE STATUS: DNR ADVANCED DIRECTIVES: No MOST FORM COMPLETE:  No HOSPICE EDUCATION PROVIDED: No  PPS: Patient is bed bound and total care accept for feedings. She is alert and oriented to self.   Duration of visit and documentation: 60 minutes      Katheren Puller, LCSW

## 2019-12-17 NOTE — Telephone Encounter (Signed)
Enrolled patient for a 7 day Zio XT monitor to be mailed to patients home.  

## 2019-12-20 NOTE — Addendum Note (Signed)
Addended by: Carylon Perches on: 12/20/2019 02:44 PM   Modules accepted: Orders

## 2020-01-01 ENCOUNTER — Other Ambulatory Visit (INDEPENDENT_AMBULATORY_CARE_PROVIDER_SITE_OTHER): Payer: Medicare Other

## 2020-01-01 DIAGNOSIS — I480 Paroxysmal atrial fibrillation: Secondary | ICD-10-CM

## 2020-01-03 ENCOUNTER — Other Ambulatory Visit: Payer: Self-pay | Admitting: Neurology

## 2020-01-06 DIAGNOSIS — D696 Thrombocytopenia, unspecified: Secondary | ICD-10-CM | POA: Diagnosis not present

## 2020-01-06 DIAGNOSIS — D638 Anemia in other chronic diseases classified elsewhere: Secondary | ICD-10-CM | POA: Diagnosis not present

## 2020-01-06 DIAGNOSIS — E039 Hypothyroidism, unspecified: Secondary | ICD-10-CM | POA: Diagnosis not present

## 2020-01-06 DIAGNOSIS — N183 Chronic kidney disease, stage 3 unspecified: Secondary | ICD-10-CM | POA: Diagnosis not present

## 2020-01-06 DIAGNOSIS — R5381 Other malaise: Secondary | ICD-10-CM | POA: Diagnosis not present

## 2020-01-06 DIAGNOSIS — I129 Hypertensive chronic kidney disease with stage 1 through stage 4 chronic kidney disease, or unspecified chronic kidney disease: Secondary | ICD-10-CM | POA: Diagnosis not present

## 2020-01-06 DIAGNOSIS — N390 Urinary tract infection, site not specified: Secondary | ICD-10-CM | POA: Diagnosis not present

## 2020-01-06 DIAGNOSIS — E785 Hyperlipidemia, unspecified: Secondary | ICD-10-CM | POA: Diagnosis not present

## 2020-01-11 DIAGNOSIS — Z87440 Personal history of urinary (tract) infections: Secondary | ICD-10-CM | POA: Diagnosis not present

## 2020-01-11 DIAGNOSIS — R8279 Other abnormal findings on microbiological examination of urine: Secondary | ICD-10-CM | POA: Diagnosis not present

## 2020-01-14 ENCOUNTER — Telehealth: Payer: Self-pay | Admitting: *Deleted

## 2020-01-14 NOTE — Telephone Encounter (Signed)
Called and spoke with patient's daughter, [name], to schedule a Palliative care home visit. Visit scheduled for 10/27@ 11a.

## 2020-01-17 DIAGNOSIS — F419 Anxiety disorder, unspecified: Secondary | ICD-10-CM | POA: Diagnosis not present

## 2020-01-17 DIAGNOSIS — I480 Paroxysmal atrial fibrillation: Secondary | ICD-10-CM | POA: Diagnosis not present

## 2020-01-17 DIAGNOSIS — F32A Depression, unspecified: Secondary | ICD-10-CM | POA: Diagnosis not present

## 2020-01-17 DIAGNOSIS — G43909 Migraine, unspecified, not intractable, without status migrainosus: Secondary | ICD-10-CM | POA: Diagnosis not present

## 2020-01-17 DIAGNOSIS — I669 Occlusion and stenosis of unspecified cerebral artery: Secondary | ICD-10-CM | POA: Diagnosis not present

## 2020-01-17 DIAGNOSIS — D696 Thrombocytopenia, unspecified: Secondary | ICD-10-CM | POA: Diagnosis not present

## 2020-01-17 DIAGNOSIS — E039 Hypothyroidism, unspecified: Secondary | ICD-10-CM | POA: Diagnosis not present

## 2020-01-17 DIAGNOSIS — N183 Chronic kidney disease, stage 3 unspecified: Secondary | ICD-10-CM | POA: Diagnosis not present

## 2020-01-17 DIAGNOSIS — M170 Bilateral primary osteoarthritis of knee: Secondary | ICD-10-CM | POA: Diagnosis not present

## 2020-01-17 DIAGNOSIS — H409 Unspecified glaucoma: Secondary | ICD-10-CM | POA: Diagnosis not present

## 2020-01-17 DIAGNOSIS — M810 Age-related osteoporosis without current pathological fracture: Secondary | ICD-10-CM | POA: Diagnosis not present

## 2020-01-17 DIAGNOSIS — F0281 Dementia in other diseases classified elsewhere with behavioral disturbance: Secondary | ICD-10-CM | POA: Diagnosis not present

## 2020-01-17 DIAGNOSIS — K5790 Diverticulosis of intestine, part unspecified, without perforation or abscess without bleeding: Secondary | ICD-10-CM | POA: Diagnosis not present

## 2020-01-17 DIAGNOSIS — K589 Irritable bowel syndrome without diarrhea: Secondary | ICD-10-CM | POA: Diagnosis not present

## 2020-01-17 DIAGNOSIS — I129 Hypertensive chronic kidney disease with stage 1 through stage 4 chronic kidney disease, or unspecified chronic kidney disease: Secondary | ICD-10-CM | POA: Diagnosis not present

## 2020-01-17 DIAGNOSIS — E559 Vitamin D deficiency, unspecified: Secondary | ICD-10-CM | POA: Diagnosis not present

## 2020-01-17 DIAGNOSIS — D631 Anemia in chronic kidney disease: Secondary | ICD-10-CM | POA: Diagnosis not present

## 2020-01-17 DIAGNOSIS — E785 Hyperlipidemia, unspecified: Secondary | ICD-10-CM | POA: Diagnosis not present

## 2020-01-17 DIAGNOSIS — F0390 Unspecified dementia without behavioral disturbance: Secondary | ICD-10-CM | POA: Diagnosis not present

## 2020-01-17 DIAGNOSIS — K219 Gastro-esophageal reflux disease without esophagitis: Secondary | ICD-10-CM | POA: Diagnosis not present

## 2020-01-17 DIAGNOSIS — R7302 Impaired glucose tolerance (oral): Secondary | ICD-10-CM | POA: Diagnosis not present

## 2020-01-17 DIAGNOSIS — M479 Spondylosis, unspecified: Secondary | ICD-10-CM | POA: Diagnosis not present

## 2020-01-17 DIAGNOSIS — R55 Syncope and collapse: Secondary | ICD-10-CM | POA: Diagnosis not present

## 2020-01-17 DIAGNOSIS — I872 Venous insufficiency (chronic) (peripheral): Secondary | ICD-10-CM | POA: Diagnosis not present

## 2020-01-17 DIAGNOSIS — E7800 Pure hypercholesterolemia, unspecified: Secondary | ICD-10-CM | POA: Diagnosis not present

## 2020-01-19 ENCOUNTER — Other Ambulatory Visit: Payer: Medicare Other | Admitting: *Deleted

## 2020-01-19 ENCOUNTER — Other Ambulatory Visit: Payer: Self-pay

## 2020-01-19 VITALS — BP 112/87 | HR 89 | Temp 97.5°F | Resp 16

## 2020-01-19 DIAGNOSIS — I480 Paroxysmal atrial fibrillation: Secondary | ICD-10-CM | POA: Diagnosis not present

## 2020-01-19 DIAGNOSIS — Z515 Encounter for palliative care: Secondary | ICD-10-CM

## 2020-01-20 DIAGNOSIS — R7302 Impaired glucose tolerance (oral): Secondary | ICD-10-CM | POA: Diagnosis not present

## 2020-01-20 DIAGNOSIS — I480 Paroxysmal atrial fibrillation: Secondary | ICD-10-CM | POA: Diagnosis not present

## 2020-01-20 DIAGNOSIS — N183 Chronic kidney disease, stage 3 unspecified: Secondary | ICD-10-CM | POA: Diagnosis not present

## 2020-01-20 DIAGNOSIS — E7800 Pure hypercholesterolemia, unspecified: Secondary | ICD-10-CM | POA: Diagnosis not present

## 2020-01-20 DIAGNOSIS — D631 Anemia in chronic kidney disease: Secondary | ICD-10-CM | POA: Diagnosis not present

## 2020-01-20 DIAGNOSIS — I129 Hypertensive chronic kidney disease with stage 1 through stage 4 chronic kidney disease, or unspecified chronic kidney disease: Secondary | ICD-10-CM | POA: Diagnosis not present

## 2020-01-21 DIAGNOSIS — E7800 Pure hypercholesterolemia, unspecified: Secondary | ICD-10-CM | POA: Diagnosis not present

## 2020-01-21 DIAGNOSIS — R7302 Impaired glucose tolerance (oral): Secondary | ICD-10-CM | POA: Diagnosis not present

## 2020-01-21 DIAGNOSIS — D631 Anemia in chronic kidney disease: Secondary | ICD-10-CM | POA: Diagnosis not present

## 2020-01-21 DIAGNOSIS — N183 Chronic kidney disease, stage 3 unspecified: Secondary | ICD-10-CM | POA: Diagnosis not present

## 2020-01-21 DIAGNOSIS — I480 Paroxysmal atrial fibrillation: Secondary | ICD-10-CM | POA: Diagnosis not present

## 2020-01-21 DIAGNOSIS — I129 Hypertensive chronic kidney disease with stage 1 through stage 4 chronic kidney disease, or unspecified chronic kidney disease: Secondary | ICD-10-CM | POA: Diagnosis not present

## 2020-01-24 ENCOUNTER — Telehealth: Payer: Self-pay | Admitting: Interventional Cardiology

## 2020-01-24 MED ORDER — METOPROLOL SUCCINATE ER 25 MG PO TB24: 12.5000 mg | ORAL_TABLET | Freq: Every day | ORAL | 3 refills | Status: AC

## 2020-01-24 NOTE — Telephone Encounter (Signed)
° ° °  [name] is returning [name] call to get pt's heart monitor result

## 2020-01-24 NOTE — Progress Notes (Signed)
COMMUNITY PALLIATIVE CARE RN NOTE  PATIENT NAME: [name] DOB: Apr 30, 1929 MRN: [id number]  PRIMARY CARE PROVIDER: [name], MD  RESPONSIBLE PARTY:  Acct ID - Guarantor Home Phone Work Phone Relationship Acct Type  [id number] REJOICE, HEATWOLE* [phone]  Self P/F     [address] [place], [place] [address]   Covid-19 Pre-screening Negative  PLAN OF CARE and INTERVENTION:  1. ADVANCE CARE PLANNING/GOALS OF CARE: Patient states that her goal is to be able to walk again using her walker. She is a DNR. 2. PATIENT/CAREGIVER EDUCATION: Symptom management, safe transfers, s/s of infection 3. DISEASE STATUS: Met with patient, daughter, son-in-law and hired caregiver in their home. Upon arrival, patient is sitting up in her recliner awake and alert. She is pleasant and engaging. She is forgetful and has word-finding difficulties. She denies pain at this time. She is currently on a 5-day course of antibiotics (Macrobid) for a UTI. She was also but on Myrbetriq for stomach spasms. Her dysuria is improving with the use of the antibiotics. No dyspnea noted during visit. Daughter states that patient does begin to wheeze at times when lying flat mainly when she is being changed and repositioned. She does fine when the head of her bed is elevated. She has been approved for physical therapy and speech therapy. She had her evaluation through [facility] on Monday ad she has her first session tomorrow. When she participated in therapy back in March 2021, she was able to stand at the kitchen sink and take a few steps with her walker. When she was discharged, she became too afraid to walk so she went back to strictly being transferred via [facility]. The speech therapist is visiting this Friday to help with her aphasia. She is transferred via [facility] and is currently non-ambulatory. Her heart was recently being monitored with a Zio patch and was sent in last week. She has Afib and was experiencing a  chest pain "tinge." They are awaiting the results from her Cardiologist. Her appetite and fluid intake is good. No skin issues. She is incontinent of both bowel and bladder. Her bowels are slightly looser since taking the antibiotics. She continues with hired caregivers daily for about 6 hours/day. Will continue to monitor.  HISTORY OF PRESENT ILLNESS: This is a 86 yo female with a history of PAF, essential hypertension, TIA, SIADH, hypothyroidism, chronic kidney disease and cognitive impairment. Palliative care team continues to follow patient and visits monthly and PRN.   CODE STATUS: DNR ADVANCED DIRECTIVES: Y MOST FORM: no PPS: 30%   PHYSICAL EXAM:   VITALS: Today's Vitals   01/19/20 1155  BP: 112/87  Pulse: 89  Resp: 16  Temp: (!) 97.5 F (36.4 C)  TempSrc: Temporal  SpO2: 96%  PainSc: 0-No pain    LUNGS: clear to auscultation  CARDIAC: Cor RRR EXTREMITIES: No edema SKIN: No skin issues  NEURO: Alert and oriented x 3, pleasant mood, word-finding difficulties, generalized weakness, transferred via [facility] lift   (Duration of visit and documentation 60 minutes)   Daryl Eastern, RN BSN

## 2020-01-24 NOTE — Telephone Encounter (Signed)
Spoke with daughter and made her aware of results and recommendations.  She verbalized understanding and was in agreement with plan.

## 2020-01-25 DIAGNOSIS — D631 Anemia in chronic kidney disease: Secondary | ICD-10-CM | POA: Diagnosis not present

## 2020-01-25 DIAGNOSIS — I129 Hypertensive chronic kidney disease with stage 1 through stage 4 chronic kidney disease, or unspecified chronic kidney disease: Secondary | ICD-10-CM | POA: Diagnosis not present

## 2020-01-25 DIAGNOSIS — R7302 Impaired glucose tolerance (oral): Secondary | ICD-10-CM | POA: Diagnosis not present

## 2020-01-25 DIAGNOSIS — N183 Chronic kidney disease, stage 3 unspecified: Secondary | ICD-10-CM | POA: Diagnosis not present

## 2020-01-25 DIAGNOSIS — I480 Paroxysmal atrial fibrillation: Secondary | ICD-10-CM | POA: Diagnosis not present

## 2020-01-25 DIAGNOSIS — E7800 Pure hypercholesterolemia, unspecified: Secondary | ICD-10-CM | POA: Diagnosis not present

## 2020-01-27 DIAGNOSIS — I129 Hypertensive chronic kidney disease with stage 1 through stage 4 chronic kidney disease, or unspecified chronic kidney disease: Secondary | ICD-10-CM | POA: Diagnosis not present

## 2020-01-27 DIAGNOSIS — N183 Chronic kidney disease, stage 3 unspecified: Secondary | ICD-10-CM | POA: Diagnosis not present

## 2020-01-27 DIAGNOSIS — E7800 Pure hypercholesterolemia, unspecified: Secondary | ICD-10-CM | POA: Diagnosis not present

## 2020-01-27 DIAGNOSIS — R7302 Impaired glucose tolerance (oral): Secondary | ICD-10-CM | POA: Diagnosis not present

## 2020-01-27 DIAGNOSIS — D631 Anemia in chronic kidney disease: Secondary | ICD-10-CM | POA: Diagnosis not present

## 2020-01-27 DIAGNOSIS — I480 Paroxysmal atrial fibrillation: Secondary | ICD-10-CM | POA: Diagnosis not present

## 2020-01-28 DIAGNOSIS — N183 Chronic kidney disease, stage 3 unspecified: Secondary | ICD-10-CM | POA: Diagnosis not present

## 2020-01-28 DIAGNOSIS — I480 Paroxysmal atrial fibrillation: Secondary | ICD-10-CM | POA: Diagnosis not present

## 2020-01-28 DIAGNOSIS — R7302 Impaired glucose tolerance (oral): Secondary | ICD-10-CM | POA: Diagnosis not present

## 2020-01-28 DIAGNOSIS — D631 Anemia in chronic kidney disease: Secondary | ICD-10-CM | POA: Diagnosis not present

## 2020-01-28 DIAGNOSIS — I129 Hypertensive chronic kidney disease with stage 1 through stage 4 chronic kidney disease, or unspecified chronic kidney disease: Secondary | ICD-10-CM | POA: Diagnosis not present

## 2020-01-28 DIAGNOSIS — E7800 Pure hypercholesterolemia, unspecified: Secondary | ICD-10-CM | POA: Diagnosis not present

## 2020-02-01 DIAGNOSIS — R7302 Impaired glucose tolerance (oral): Secondary | ICD-10-CM | POA: Diagnosis not present

## 2020-02-01 DIAGNOSIS — D631 Anemia in chronic kidney disease: Secondary | ICD-10-CM | POA: Diagnosis not present

## 2020-02-01 DIAGNOSIS — N183 Chronic kidney disease, stage 3 unspecified: Secondary | ICD-10-CM | POA: Diagnosis not present

## 2020-02-01 DIAGNOSIS — I480 Paroxysmal atrial fibrillation: Secondary | ICD-10-CM | POA: Diagnosis not present

## 2020-02-01 DIAGNOSIS — E7800 Pure hypercholesterolemia, unspecified: Secondary | ICD-10-CM | POA: Diagnosis not present

## 2020-02-01 DIAGNOSIS — I129 Hypertensive chronic kidney disease with stage 1 through stage 4 chronic kidney disease, or unspecified chronic kidney disease: Secondary | ICD-10-CM | POA: Diagnosis not present

## 2020-02-03 DIAGNOSIS — I480 Paroxysmal atrial fibrillation: Secondary | ICD-10-CM | POA: Diagnosis not present

## 2020-02-03 DIAGNOSIS — E7800 Pure hypercholesterolemia, unspecified: Secondary | ICD-10-CM | POA: Diagnosis not present

## 2020-02-03 DIAGNOSIS — I129 Hypertensive chronic kidney disease with stage 1 through stage 4 chronic kidney disease, or unspecified chronic kidney disease: Secondary | ICD-10-CM | POA: Diagnosis not present

## 2020-02-03 DIAGNOSIS — R7302 Impaired glucose tolerance (oral): Secondary | ICD-10-CM | POA: Diagnosis not present

## 2020-02-03 DIAGNOSIS — D631 Anemia in chronic kidney disease: Secondary | ICD-10-CM | POA: Diagnosis not present

## 2020-02-03 DIAGNOSIS — N183 Chronic kidney disease, stage 3 unspecified: Secondary | ICD-10-CM | POA: Diagnosis not present

## 2020-02-04 DIAGNOSIS — I129 Hypertensive chronic kidney disease with stage 1 through stage 4 chronic kidney disease, or unspecified chronic kidney disease: Secondary | ICD-10-CM | POA: Diagnosis not present

## 2020-02-04 DIAGNOSIS — N183 Chronic kidney disease, stage 3 unspecified: Secondary | ICD-10-CM | POA: Diagnosis not present

## 2020-02-04 DIAGNOSIS — R7302 Impaired glucose tolerance (oral): Secondary | ICD-10-CM | POA: Diagnosis not present

## 2020-02-04 DIAGNOSIS — D631 Anemia in chronic kidney disease: Secondary | ICD-10-CM | POA: Diagnosis not present

## 2020-02-04 DIAGNOSIS — E7800 Pure hypercholesterolemia, unspecified: Secondary | ICD-10-CM | POA: Diagnosis not present

## 2020-02-04 DIAGNOSIS — I480 Paroxysmal atrial fibrillation: Secondary | ICD-10-CM | POA: Diagnosis not present

## 2020-02-09 DIAGNOSIS — E7800 Pure hypercholesterolemia, unspecified: Secondary | ICD-10-CM | POA: Diagnosis not present

## 2020-02-09 DIAGNOSIS — I129 Hypertensive chronic kidney disease with stage 1 through stage 4 chronic kidney disease, or unspecified chronic kidney disease: Secondary | ICD-10-CM | POA: Diagnosis not present

## 2020-02-09 DIAGNOSIS — I480 Paroxysmal atrial fibrillation: Secondary | ICD-10-CM | POA: Diagnosis not present

## 2020-02-09 DIAGNOSIS — R7302 Impaired glucose tolerance (oral): Secondary | ICD-10-CM | POA: Diagnosis not present

## 2020-02-09 DIAGNOSIS — D631 Anemia in chronic kidney disease: Secondary | ICD-10-CM | POA: Diagnosis not present

## 2020-02-09 DIAGNOSIS — N183 Chronic kidney disease, stage 3 unspecified: Secondary | ICD-10-CM | POA: Diagnosis not present

## 2020-02-10 ENCOUNTER — Telehealth: Payer: Self-pay | Admitting: Physician Assistant

## 2020-02-10 DIAGNOSIS — N183 Chronic kidney disease, stage 3 unspecified: Secondary | ICD-10-CM | POA: Diagnosis not present

## 2020-02-10 DIAGNOSIS — I480 Paroxysmal atrial fibrillation: Secondary | ICD-10-CM | POA: Diagnosis not present

## 2020-02-10 DIAGNOSIS — R7302 Impaired glucose tolerance (oral): Secondary | ICD-10-CM | POA: Diagnosis not present

## 2020-02-10 DIAGNOSIS — E7800 Pure hypercholesterolemia, unspecified: Secondary | ICD-10-CM | POA: Diagnosis not present

## 2020-02-10 DIAGNOSIS — I129 Hypertensive chronic kidney disease with stage 1 through stage 4 chronic kidney disease, or unspecified chronic kidney disease: Secondary | ICD-10-CM | POA: Diagnosis not present

## 2020-02-10 DIAGNOSIS — D631 Anemia in chronic kidney disease: Secondary | ICD-10-CM | POA: Diagnosis not present

## 2020-02-10 NOTE — Telephone Encounter (Signed)
I connected by phone with Veverly Fells and/or patient's caregiver on 02/10/2020 at 6:14 PM to discuss the potential vaccination through our Homebound vaccination initiative.   Prevaccination Checklist for COVID-19 Vaccines  1.  Are you feeling sick today? no  2.  Have you ever received a dose of a COVID-19 vaccine?  yes      If yes, which one? Moderna on 1/18 and 05/10/19   3.  Have you ever had an allergic reaction: (This would include a severe reaction [ e.g., anaphylaxis] that required treatment with epinephrine or EpiPen or that caused you to go to the hospital.  It would also include an allergic reaction that occurred within 4 hours that caused hives, swelling, or respiratory distress, including wheezing.) A.  A previous dose of COVID-19 vaccine. no  B.  A vaccine or injectable therapy that contains multiple components, one of which is a COVID-19 vaccine component, but it is not known which component elicited the immediate reaction. no  C.  Are you allergic to polyethylene glycol? no  D. Are you allergic to Polysorbate, which is found in some vaccines, film coated tablets and intravenous steroids?  no   4.  Have you ever had an allergic reaction to another vaccine (other than COVID-19 vaccine) or an injectable medication? (This would include a severe reaction [ e.g., anaphylaxis] that required treatment with epinephrine or EpiPen or that caused you to go to the hospital.  It would also include an allergic reaction that occurred within 4 hours that caused hives, swelling, or respiratory distress, including wheezing.)  no   5.  Have you ever had a severe allergic reaction (e.g., anaphylaxis) to something other than a component of the COVID-19 vaccine, or any vaccine or injectable medication?  This would include food, pet, venom, environmental, or oral medication allergies.  no   6.  Have you received any vaccine in the last 14 days? no   7.  Have you ever had a positive test for COVID-19 or  has a doctor ever told you that you had COVID-19?  no   8.  Have you received passive antibody therapy (monoclonal antibodies or convalescent serum) as a treatment for COVID-19? no   9.  Do you have a weakened immune system caused by something such as HIV infection or cancer or do you take immunosuppressive drugs or therapies?  no   10.  Do you have a bleeding disorder or are you taking a blood thinner? yes   11.  Are you pregnant or breast-feeding? no   12.  Do you have dermal fillers? no   __________________   This patient is a 86 y.o. female that meets the FDA criteria to receive homebound vaccination. Patient or parent/caregiver understands they have the option to accept or refuse homebound vaccination.  Patient passed the pre-screening checklist and would like to proceed with homebound vaccination.  Based on questionnaire above, I recommend the patient be observed for 30 minutes.  There are an estimated 1 other household members/caregivers who are also interested in receiving the vaccine. Daughter may want booster   I will send the patient's information to our scheduling team who will reach out to schedule the patient and potential caregiver/family members for homebound vaccination.    Angelena Form 02/10/2020 6:14 PM

## 2020-02-10 NOTE — Telephone Encounter (Signed)
Called to discuss the homebound Covid-19 vaccination initiative with the patient and/or caregiver.   Message left to call back.  Frankee Gritz PA-C  [facility]     

## 2020-02-11 DIAGNOSIS — I480 Paroxysmal atrial fibrillation: Secondary | ICD-10-CM | POA: Diagnosis not present

## 2020-02-11 DIAGNOSIS — I129 Hypertensive chronic kidney disease with stage 1 through stage 4 chronic kidney disease, or unspecified chronic kidney disease: Secondary | ICD-10-CM | POA: Diagnosis not present

## 2020-02-11 DIAGNOSIS — D631 Anemia in chronic kidney disease: Secondary | ICD-10-CM | POA: Diagnosis not present

## 2020-02-11 DIAGNOSIS — N183 Chronic kidney disease, stage 3 unspecified: Secondary | ICD-10-CM | POA: Diagnosis not present

## 2020-02-11 DIAGNOSIS — R7302 Impaired glucose tolerance (oral): Secondary | ICD-10-CM | POA: Diagnosis not present

## 2020-02-11 DIAGNOSIS — E7800 Pure hypercholesterolemia, unspecified: Secondary | ICD-10-CM | POA: Diagnosis not present

## 2020-02-14 DIAGNOSIS — N183 Chronic kidney disease, stage 3 unspecified: Secondary | ICD-10-CM | POA: Diagnosis not present

## 2020-02-14 DIAGNOSIS — E7800 Pure hypercholesterolemia, unspecified: Secondary | ICD-10-CM | POA: Diagnosis not present

## 2020-02-14 DIAGNOSIS — R7302 Impaired glucose tolerance (oral): Secondary | ICD-10-CM | POA: Diagnosis not present

## 2020-02-14 DIAGNOSIS — I480 Paroxysmal atrial fibrillation: Secondary | ICD-10-CM | POA: Diagnosis not present

## 2020-02-14 DIAGNOSIS — D631 Anemia in chronic kidney disease: Secondary | ICD-10-CM | POA: Diagnosis not present

## 2020-02-14 DIAGNOSIS — I129 Hypertensive chronic kidney disease with stage 1 through stage 4 chronic kidney disease, or unspecified chronic kidney disease: Secondary | ICD-10-CM | POA: Diagnosis not present

## 2020-02-15 DIAGNOSIS — N183 Chronic kidney disease, stage 3 unspecified: Secondary | ICD-10-CM | POA: Diagnosis not present

## 2020-02-15 DIAGNOSIS — I129 Hypertensive chronic kidney disease with stage 1 through stage 4 chronic kidney disease, or unspecified chronic kidney disease: Secondary | ICD-10-CM | POA: Diagnosis not present

## 2020-02-15 DIAGNOSIS — R7302 Impaired glucose tolerance (oral): Secondary | ICD-10-CM | POA: Diagnosis not present

## 2020-02-15 DIAGNOSIS — D631 Anemia in chronic kidney disease: Secondary | ICD-10-CM | POA: Diagnosis not present

## 2020-02-15 DIAGNOSIS — I480 Paroxysmal atrial fibrillation: Secondary | ICD-10-CM | POA: Diagnosis not present

## 2020-02-15 DIAGNOSIS — E7800 Pure hypercholesterolemia, unspecified: Secondary | ICD-10-CM | POA: Diagnosis not present

## 2020-02-16 DIAGNOSIS — E785 Hyperlipidemia, unspecified: Secondary | ICD-10-CM | POA: Diagnosis not present

## 2020-02-16 DIAGNOSIS — G43909 Migraine, unspecified, not intractable, without status migrainosus: Secondary | ICD-10-CM | POA: Diagnosis not present

## 2020-02-16 DIAGNOSIS — M479 Spondylosis, unspecified: Secondary | ICD-10-CM | POA: Diagnosis not present

## 2020-02-16 DIAGNOSIS — E039 Hypothyroidism, unspecified: Secondary | ICD-10-CM | POA: Diagnosis not present

## 2020-02-16 DIAGNOSIS — M810 Age-related osteoporosis without current pathological fracture: Secondary | ICD-10-CM | POA: Diagnosis not present

## 2020-02-16 DIAGNOSIS — E559 Vitamin D deficiency, unspecified: Secondary | ICD-10-CM | POA: Diagnosis not present

## 2020-02-16 DIAGNOSIS — R7302 Impaired glucose tolerance (oral): Secondary | ICD-10-CM | POA: Diagnosis not present

## 2020-02-16 DIAGNOSIS — H409 Unspecified glaucoma: Secondary | ICD-10-CM | POA: Diagnosis not present

## 2020-02-16 DIAGNOSIS — F32A Depression, unspecified: Secondary | ICD-10-CM | POA: Diagnosis not present

## 2020-02-16 DIAGNOSIS — K219 Gastro-esophageal reflux disease without esophagitis: Secondary | ICD-10-CM | POA: Diagnosis not present

## 2020-02-16 DIAGNOSIS — N183 Chronic kidney disease, stage 3 unspecified: Secondary | ICD-10-CM | POA: Diagnosis not present

## 2020-02-16 DIAGNOSIS — M170 Bilateral primary osteoarthritis of knee: Secondary | ICD-10-CM | POA: Diagnosis not present

## 2020-02-16 DIAGNOSIS — K589 Irritable bowel syndrome without diarrhea: Secondary | ICD-10-CM | POA: Diagnosis not present

## 2020-02-16 DIAGNOSIS — F0281 Dementia in other diseases classified elsewhere with behavioral disturbance: Secondary | ICD-10-CM | POA: Diagnosis not present

## 2020-02-16 DIAGNOSIS — K5790 Diverticulosis of intestine, part unspecified, without perforation or abscess without bleeding: Secondary | ICD-10-CM | POA: Diagnosis not present

## 2020-02-16 DIAGNOSIS — R55 Syncope and collapse: Secondary | ICD-10-CM | POA: Diagnosis not present

## 2020-02-16 DIAGNOSIS — I872 Venous insufficiency (chronic) (peripheral): Secondary | ICD-10-CM | POA: Diagnosis not present

## 2020-02-16 DIAGNOSIS — I129 Hypertensive chronic kidney disease with stage 1 through stage 4 chronic kidney disease, or unspecified chronic kidney disease: Secondary | ICD-10-CM | POA: Diagnosis not present

## 2020-02-16 DIAGNOSIS — D631 Anemia in chronic kidney disease: Secondary | ICD-10-CM | POA: Diagnosis not present

## 2020-02-16 DIAGNOSIS — I669 Occlusion and stenosis of unspecified cerebral artery: Secondary | ICD-10-CM | POA: Diagnosis not present

## 2020-02-16 DIAGNOSIS — E7800 Pure hypercholesterolemia, unspecified: Secondary | ICD-10-CM | POA: Diagnosis not present

## 2020-02-16 DIAGNOSIS — F419 Anxiety disorder, unspecified: Secondary | ICD-10-CM | POA: Diagnosis not present

## 2020-02-16 DIAGNOSIS — D696 Thrombocytopenia, unspecified: Secondary | ICD-10-CM | POA: Diagnosis not present

## 2020-02-16 DIAGNOSIS — I480 Paroxysmal atrial fibrillation: Secondary | ICD-10-CM | POA: Diagnosis not present

## 2020-02-21 DIAGNOSIS — N390 Urinary tract infection, site not specified: Secondary | ICD-10-CM | POA: Diagnosis not present

## 2020-02-23 DIAGNOSIS — I129 Hypertensive chronic kidney disease with stage 1 through stage 4 chronic kidney disease, or unspecified chronic kidney disease: Secondary | ICD-10-CM | POA: Diagnosis not present

## 2020-02-23 DIAGNOSIS — I480 Paroxysmal atrial fibrillation: Secondary | ICD-10-CM | POA: Diagnosis not present

## 2020-02-23 DIAGNOSIS — D631 Anemia in chronic kidney disease: Secondary | ICD-10-CM | POA: Diagnosis not present

## 2020-02-23 DIAGNOSIS — R7302 Impaired glucose tolerance (oral): Secondary | ICD-10-CM | POA: Diagnosis not present

## 2020-02-23 DIAGNOSIS — E7800 Pure hypercholesterolemia, unspecified: Secondary | ICD-10-CM | POA: Diagnosis not present

## 2020-02-23 DIAGNOSIS — N183 Chronic kidney disease, stage 3 unspecified: Secondary | ICD-10-CM | POA: Diagnosis not present

## 2020-02-25 DIAGNOSIS — R7302 Impaired glucose tolerance (oral): Secondary | ICD-10-CM | POA: Diagnosis not present

## 2020-02-25 DIAGNOSIS — D631 Anemia in chronic kidney disease: Secondary | ICD-10-CM | POA: Diagnosis not present

## 2020-02-25 DIAGNOSIS — I480 Paroxysmal atrial fibrillation: Secondary | ICD-10-CM | POA: Diagnosis not present

## 2020-02-25 DIAGNOSIS — N183 Chronic kidney disease, stage 3 unspecified: Secondary | ICD-10-CM | POA: Diagnosis not present

## 2020-02-25 DIAGNOSIS — E7800 Pure hypercholesterolemia, unspecified: Secondary | ICD-10-CM | POA: Diagnosis not present

## 2020-02-25 DIAGNOSIS — I129 Hypertensive chronic kidney disease with stage 1 through stage 4 chronic kidney disease, or unspecified chronic kidney disease: Secondary | ICD-10-CM | POA: Diagnosis not present

## 2020-02-28 DIAGNOSIS — E7800 Pure hypercholesterolemia, unspecified: Secondary | ICD-10-CM | POA: Diagnosis not present

## 2020-02-28 DIAGNOSIS — I129 Hypertensive chronic kidney disease with stage 1 through stage 4 chronic kidney disease, or unspecified chronic kidney disease: Secondary | ICD-10-CM | POA: Diagnosis not present

## 2020-02-28 DIAGNOSIS — R7302 Impaired glucose tolerance (oral): Secondary | ICD-10-CM | POA: Diagnosis not present

## 2020-02-28 DIAGNOSIS — I480 Paroxysmal atrial fibrillation: Secondary | ICD-10-CM | POA: Diagnosis not present

## 2020-02-28 DIAGNOSIS — N183 Chronic kidney disease, stage 3 unspecified: Secondary | ICD-10-CM | POA: Diagnosis not present

## 2020-02-28 DIAGNOSIS — D631 Anemia in chronic kidney disease: Secondary | ICD-10-CM | POA: Diagnosis not present

## 2020-03-02 DIAGNOSIS — I129 Hypertensive chronic kidney disease with stage 1 through stage 4 chronic kidney disease, or unspecified chronic kidney disease: Secondary | ICD-10-CM | POA: Diagnosis not present

## 2020-03-02 DIAGNOSIS — D631 Anemia in chronic kidney disease: Secondary | ICD-10-CM | POA: Diagnosis not present

## 2020-03-02 DIAGNOSIS — E7800 Pure hypercholesterolemia, unspecified: Secondary | ICD-10-CM | POA: Diagnosis not present

## 2020-03-02 DIAGNOSIS — R7302 Impaired glucose tolerance (oral): Secondary | ICD-10-CM | POA: Diagnosis not present

## 2020-03-02 DIAGNOSIS — I480 Paroxysmal atrial fibrillation: Secondary | ICD-10-CM | POA: Diagnosis not present

## 2020-03-02 DIAGNOSIS — N183 Chronic kidney disease, stage 3 unspecified: Secondary | ICD-10-CM | POA: Diagnosis not present

## 2020-03-03 DIAGNOSIS — I129 Hypertensive chronic kidney disease with stage 1 through stage 4 chronic kidney disease, or unspecified chronic kidney disease: Secondary | ICD-10-CM | POA: Diagnosis not present

## 2020-03-03 DIAGNOSIS — D631 Anemia in chronic kidney disease: Secondary | ICD-10-CM | POA: Diagnosis not present

## 2020-03-03 DIAGNOSIS — N183 Chronic kidney disease, stage 3 unspecified: Secondary | ICD-10-CM | POA: Diagnosis not present

## 2020-03-03 DIAGNOSIS — R7302 Impaired glucose tolerance (oral): Secondary | ICD-10-CM | POA: Diagnosis not present

## 2020-03-03 DIAGNOSIS — I480 Paroxysmal atrial fibrillation: Secondary | ICD-10-CM | POA: Diagnosis not present

## 2020-03-03 DIAGNOSIS — E7800 Pure hypercholesterolemia, unspecified: Secondary | ICD-10-CM | POA: Diagnosis not present

## 2020-03-07 ENCOUNTER — Encounter: Payer: Self-pay | Admitting: Podiatry

## 2020-03-07 ENCOUNTER — Telehealth: Payer: Self-pay | Admitting: *Deleted

## 2020-03-07 ENCOUNTER — Other Ambulatory Visit: Payer: Medicare Other | Admitting: *Deleted

## 2020-03-07 ENCOUNTER — Other Ambulatory Visit: Payer: Self-pay

## 2020-03-07 ENCOUNTER — Ambulatory Visit (INDEPENDENT_AMBULATORY_CARE_PROVIDER_SITE_OTHER): Payer: Medicare Other | Admitting: Podiatry

## 2020-03-07 DIAGNOSIS — E559 Vitamin D deficiency, unspecified: Secondary | ICD-10-CM | POA: Insufficient documentation

## 2020-03-07 DIAGNOSIS — M79674 Pain in right toe(s): Secondary | ICD-10-CM | POA: Diagnosis not present

## 2020-03-07 DIAGNOSIS — D6869 Other thrombophilia: Secondary | ICD-10-CM | POA: Insufficient documentation

## 2020-03-07 DIAGNOSIS — M79675 Pain in left toe(s): Secondary | ICD-10-CM | POA: Diagnosis not present

## 2020-03-07 DIAGNOSIS — M2042 Other hammer toe(s) (acquired), left foot: Secondary | ICD-10-CM

## 2020-03-07 DIAGNOSIS — E7800 Pure hypercholesterolemia, unspecified: Secondary | ICD-10-CM | POA: Insufficient documentation

## 2020-03-07 DIAGNOSIS — M810 Age-related osteoporosis without current pathological fracture: Secondary | ICD-10-CM | POA: Insufficient documentation

## 2020-03-07 DIAGNOSIS — D696 Thrombocytopenia, unspecified: Secondary | ICD-10-CM | POA: Insufficient documentation

## 2020-03-07 DIAGNOSIS — Z515 Encounter for palliative care: Secondary | ICD-10-CM

## 2020-03-07 DIAGNOSIS — I872 Venous insufficiency (chronic) (peripheral): Secondary | ICD-10-CM | POA: Insufficient documentation

## 2020-03-07 DIAGNOSIS — G4733 Obstructive sleep apnea (adult) (pediatric): Secondary | ICD-10-CM | POA: Insufficient documentation

## 2020-03-07 DIAGNOSIS — B351 Tinea unguium: Secondary | ICD-10-CM

## 2020-03-07 DIAGNOSIS — J309 Allergic rhinitis, unspecified: Secondary | ICD-10-CM | POA: Insufficient documentation

## 2020-03-07 DIAGNOSIS — N390 Urinary tract infection, site not specified: Secondary | ICD-10-CM | POA: Insufficient documentation

## 2020-03-07 DIAGNOSIS — M2041 Other hammer toe(s) (acquired), right foot: Secondary | ICD-10-CM

## 2020-03-07 DIAGNOSIS — R32 Unspecified urinary incontinence: Secondary | ICD-10-CM | POA: Insufficient documentation

## 2020-03-07 DIAGNOSIS — G43909 Migraine, unspecified, not intractable, without status migrainosus: Secondary | ICD-10-CM | POA: Insufficient documentation

## 2020-03-07 DIAGNOSIS — E785 Hyperlipidemia, unspecified: Secondary | ICD-10-CM | POA: Insufficient documentation

## 2020-03-07 DIAGNOSIS — K5730 Diverticulosis of large intestine without perforation or abscess without bleeding: Secondary | ICD-10-CM | POA: Insufficient documentation

## 2020-03-07 DIAGNOSIS — M179 Osteoarthritis of knee, unspecified: Secondary | ICD-10-CM | POA: Insufficient documentation

## 2020-03-07 DIAGNOSIS — F324 Major depressive disorder, single episode, in partial remission: Secondary | ICD-10-CM | POA: Insufficient documentation

## 2020-03-07 HISTORY — DX: Major depressive disorder, single episode, in partial remission: F32.4

## 2020-03-07 NOTE — Telephone Encounter (Signed)
Called and left a message with patient's daughter, [name], to schedule a Palliative care home visit. Contact information left for return call.

## 2020-03-07 NOTE — Patient Instructions (Signed)
Pressure Injury  A pressure injury is damage to the skin and underlying tissue that results from pressure being applied to an area of the body. It often affects people who must spend a long time in a bed or chair because of a medical condition. Pressure injuries usually occur:  Over bony parts of the body, such as the tailbone, shoulders, elbows, hips, heels, spine, ankles, and back of the head.  Under medical devices that make contact with the body, such as respiratory equipment, stockings, tubes, and splints. Pressure injuries start as reddened areas on the skin and can lead to pain and an open wound. What are the causes? This condition is caused by frequent or constant pressure to an area of the body. Decreased blood flow to the skin can eventually cause the skin tissue to die and break down, causing a wound. What increases the risk? You are more likely to develop this condition if you:  Are in the hospital or an extended care facility.  Are bedridden or in a wheelchair.  Have an injury or disease that keeps you from: ? Moving normally. ? Feeling pain or pressure.  Have a condition that: ? Makes you sleepy or less alert. ? Causes poor blood flow.  Need to wear a medical device.  Have poor control of your bladder or bowel functions (incontinence).  Have poor nutrition (malnutrition). If you are at risk for pressure injuries, your health care provider may recommend certain types of mattresses, mattress covers, pillows, cushions, or boots to help prevent them. These may include products filled with air, foam, gel, or sand. What are the signs or symptoms? Symptoms of this condition depend on the severity of the injury. Symptoms may include:  Red or dark areas of the skin.  Pain, warmth, or a change of skin texture.  Blisters.  An open wound. How is this diagnosed? This condition is diagnosed with a medical history and physical exam. You may also have tests, such as:  Blood  tests.  Imaging tests.  Blood flow tests. Your pressure injury will be staged based on its severity. Staging is based on:  The depth of the tissue injury, including whether there is exposure of muscle, bone, or tendon.  The cause of the pressure injury. How is this treated? This condition may be treated by:  Relieving or redistributing pressure on your skin. This includes: ? Frequently changing your position. ? Avoiding positions that caused the wound or that can make the wound worse. ? Using specific bed mattresses, chair cushions, or protective boots. ? Moving medical devices from an area of pressure, or placing padding between the skin and the device. ? Using foams, creams, or powders to prevent rubbing (friction) on the skin.  Keeping your skin clean and dry. This may include using a skin cleanser or skin barrier as told by your health care provider.  Cleaning your injury and removing any dead tissue from the wound (debridement).  Placing a bandage (dressing) over your injury.  Using medicines for pain or to prevent or treat infection. Surgery may be needed if other treatments are not working or if your injury is very deep. Follow these instructions at home: Wound care  Follow instructions from your health care provider about how to take care of your wound. Make sure you: ? Wash your hands with soap and water before and after you change your bandage (dressing). If soap and water are not available, use hand sanitizer. ? Change your dressing as told   by your health care provider.  Check your wound every day for signs of infection. Have a caregiver do this for you if you are not able. Check for: ? Redness, swelling, or increased pain. ? More fluid or blood. ? Warmth. ? Pus or a bad smell. Skin care  Keep your skin clean and dry. Gently pat your skin dry.  Do not rub or massage your skin.  You or a caregiver should check your skin every day for any changes in color or  any new blisters or sores (ulcers). Medicines  Take over-the-counter and prescription medicines only as told by your health care provider.  If you were prescribed an antibiotic medicine, take or apply it as told by your health care provider. Do not stop using the antibiotic even if your condition improves. Reducing and redistributing pressure  Do not lie or sit in one position for a long time. Move or change position every 1-2 hours, or as told by your health care provider.  Use pillows or cushions to reduce pressure. Ask your health care provider to recommend cushions or pads for you. General instructions   Eat a healthy diet that includes lots of protein.  Drink enough fluid to keep your urine pale yellow.  Be as active as you can every day. Ask your health care provider to suggest safe exercises or activities.  Do not abuse drugs or alcohol.  Do not use any products that contain nicotine or tobacco, such as cigarettes, e-cigarettes, and chewing tobacco. If you need help quitting, ask your health care provider.  Keep all follow-up visits as told by your health care provider. This is important. Contact a health care provider if:  You have: ? A fever or chills. ? Pain that is not helped by medicine. ? Any changes in skin color. ? New blisters or sores. ? Pus or a bad smell coming from your wound. ? Redness, swelling, or pain around your wound. ? More fluid or blood coming from your wound.  Your wound does not improve after 1-2 weeks of treatment. Summary  A pressure injury is damage to the skin and underlying tissue that results from pressure being applied to an area of the body.  Do not lie or sit in one position for a long time. Your health care provider may advise you to move or change position every 1-2 hours.  Follow instructions from your health care provider about how to take care of your wound.  Keep all follow-up visits as told by your health care provider. This  is important. This information is not intended to replace advice given to you by your health care provider. Make sure you discuss any questions you have with your health care provider. Document Revised: 10/08/2017 Document Reviewed: 10/08/2017 [employer] Patient Education  2020 [employer]  

## 2020-03-09 DIAGNOSIS — R7302 Impaired glucose tolerance (oral): Secondary | ICD-10-CM | POA: Diagnosis not present

## 2020-03-09 DIAGNOSIS — D631 Anemia in chronic kidney disease: Secondary | ICD-10-CM | POA: Diagnosis not present

## 2020-03-09 DIAGNOSIS — E7800 Pure hypercholesterolemia, unspecified: Secondary | ICD-10-CM | POA: Diagnosis not present

## 2020-03-09 DIAGNOSIS — I480 Paroxysmal atrial fibrillation: Secondary | ICD-10-CM | POA: Diagnosis not present

## 2020-03-09 DIAGNOSIS — N183 Chronic kidney disease, stage 3 unspecified: Secondary | ICD-10-CM | POA: Diagnosis not present

## 2020-03-09 DIAGNOSIS — I129 Hypertensive chronic kidney disease with stage 1 through stage 4 chronic kidney disease, or unspecified chronic kidney disease: Secondary | ICD-10-CM | POA: Diagnosis not present

## 2020-03-10 DIAGNOSIS — H401133 Primary open-angle glaucoma, bilateral, severe stage: Secondary | ICD-10-CM | POA: Diagnosis not present

## 2020-03-11 DIAGNOSIS — E7800 Pure hypercholesterolemia, unspecified: Secondary | ICD-10-CM | POA: Diagnosis not present

## 2020-03-11 DIAGNOSIS — R7302 Impaired glucose tolerance (oral): Secondary | ICD-10-CM | POA: Diagnosis not present

## 2020-03-11 DIAGNOSIS — D631 Anemia in chronic kidney disease: Secondary | ICD-10-CM | POA: Diagnosis not present

## 2020-03-11 DIAGNOSIS — I129 Hypertensive chronic kidney disease with stage 1 through stage 4 chronic kidney disease, or unspecified chronic kidney disease: Secondary | ICD-10-CM | POA: Diagnosis not present

## 2020-03-11 DIAGNOSIS — N183 Chronic kidney disease, stage 3 unspecified: Secondary | ICD-10-CM | POA: Diagnosis not present

## 2020-03-11 DIAGNOSIS — I480 Paroxysmal atrial fibrillation: Secondary | ICD-10-CM | POA: Diagnosis not present

## 2020-03-12 NOTE — Progress Notes (Signed)
Subjective: [name] presents today for at risk foot care with h/o clotting disorder and painful thick toenails that are difficult to trim. Pain interferes with ambulation. Aggravating factors include wearing enclosed shoe gear. Pain is relieved with periodic professional debridement.   Daughter is present during today's visit. They voice no new pedal concerns on today's visit.  [name], MD is patient's PCP. Last visit was 02/25/2020.  Past Medical History:  Diagnosis Date  . A-fib ([place])   . Acid reflux   . Anxiety   . Arthritis   . Chronic anticoagulation 03/07/2013   Lovenox 100 QD  . Chronic kidney disease    Stage III  . CVA (cerebral infarction) 1964  . Dementia [facility])    Dr Brett Fairy  . Diverticulosis   . DVT (deep venous thrombosis) ([place]) 2007   recurrent w PE s/p [place] filter  . GERD (gastroesophageal reflux disease)    w hiatal hernia endoscopy 2003, Dr Penelope Coop  . Glaucoma   . Hypercholesterolemia   . Hypertension   . Hypoglycemia   . Hypothyroidism    goiter  . IBS (irritable bowel syndrome)   . Junctional bradycardia    resolved with discontinuation of sotalol (2014) after 10 yrs  . Knee injury   . Major depression single episode, in partial remission ([place]) 03/07/2020  . Memory loss   . Migraine   . Osteoarthritis    in Bilateral knees and back. -Dr Latanya Maudlin  . Osteoporosis   . Panic attacks   . Phlebitis   . Postherpetic trigeminal neuralgia 10/20/2012  . Pulmonary embolism recurrent    IVC filter  . Shingles    zoster 1990 on the back and on the abdomen in 2007,facial zoster 2006  . Trigeminal neuralgia    r face--Dr Dohmeier  . Unspecified cerebral artery occlusion with cerebral infarction   . Vitamin D deficiency     Patient Active Problem List   Diagnosis Date Noted  . Acquired thrombophilia ([place]) 03/07/2020  . Age-related osteoporosis without current pathological fracture 03/07/2020  . Allergic rhinitis 03/07/2020  . Chronic  venous insufficiency 03/07/2020  . Diverticular disease of colon 03/07/2020  . Dyslipidemia 03/07/2020  . Hyperlipidemia, unspecified 03/07/2020  . Major depression single episode, in partial remission ([place]) 03/07/2020  . Migraine 03/07/2020  . Obstructive sleep apnea syndrome 03/07/2020  . Osteoarthritis of knee 03/07/2020  . Pure hypercholesterolemia 03/07/2020  . Recurrent urinary tract infection 03/07/2020  . Thrombocytopenia ([place]) 03/07/2020  . Urinary incontinence 03/07/2020  . Vitamin D deficiency 03/07/2020  . Right knee injury 04/27/2019  . Syncope and collapse 04/27/2019  . Chronic kidney disease   . Closed fracture of medial condyle of distal femur ([place]) 10/19/2018  . Cognitive impairment 10/31/2014  . TIA (transient ischemic attack) 12/03/2013  . UTI (lower urinary tract infection) 12/03/2013  . SIADH (syndrome of inappropriate ADH production) ([place]) 08/07/2013  . Hyponatremia 08/03/2013  . Back pain 08/03/2013  . Closed left arm fracture 08/03/2013  . Essential hypertension 04/07/2013  . PAF (paroxysmal atrial fibrillation) ([place]) 03/07/2013  . Chronic anticoagulation 03/07/2013  . Obesity, unspecified 03/07/2013  . Nausea 03/07/2013  . Hypothyroidism 03/07/2013  . Bradycardia 03/06/2013  . Dermatophytosis of nail 12/30/2012  . 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 Unspecified cerebral artery occlusion with cerebral infarction   . Unspecified deficiency anemia 10/21/2012  . Postherpetic trigeminal neuralgia 10/20/2012    Past Surgical History:  Procedure Laterality Date  . APPENDECTOMY  1945  .  biopsies of breast Bilateral 1971  . blood clots  06/2006   lower abdomen  . CARDIAC CATHETERIZATION  08/2001   A-fib/Palpitations Dr. Daneen Schick  . CHOLECYSTECTOMY  01/1983  . DILATION AND CURETTAGE, DIAGNOSTIC / THERAPEUTIC  1952/1977   x2  . FOOT SURGERY Right 1978   Cyst removed from Right Foot  . [place]  . [place]  . insertion of vena cova filter  2007  . LEG / ANKLE SOFT TISSUE BIOPSY Right    precancerous  . migraine speech impairment  07/1963  . phlebitis  12/2005  . PULMONARY EMBOLISM SURGERY  03/2005  . TONSILLECTOMY     1954    Current Outpatient Medications on File Prior to Visit  Medication Sig Dispense Refill  . acetaminophen (TYLENOL) 500 MG tablet 2 tablets    . carbamazepine (CARBATROL) 100 MG 12 hr capsule Take 1 capsule (100 mg total) by mouth 2 (two) times daily. 60 capsule 5  . cephALEXin (KEFLEX) 250 MG capsule Take 250 mg by mouth daily.    . cholecalciferol (VITAMIN D) 1000 units tablet Take 2,000 Units by mouth daily.     . clindamycin (CLEOCIN) 150 MG capsule 2 capsules 1 hour prior and 1 capsule 6 hours after dental appt----vena cava filter for hx of blood clots    . ELIQUIS 5 MG TABS tablet Take 5 mg by mouth 2 (two) times daily.    Marland Kitchen EPINEPHrine (EPIPEN 2-PAK) 0.3 mg/0.3 mL IJ SOAJ injection Inject 0.3 mg into the muscle as needed for anaphylaxis.    Marland Kitchen Fe Fum-FA-B Cmp-C-Zn-Mg-Mn-Cu (HEMOCYTE PLUS) 106-1 MG CAPS 1 capsule between meals    . feeding supplement, ENSURE ENLIVE, (ENSURE ENLIVE) LIQD Take 237 mLs by mouth daily at 3 pm. 237 mL 12  . fluticasone (FLONASE) 50 MCG/ACT nasal spray Place 1 spray into left nostril daily as needed for allergies or rhinitis.   12  . folic acid (FOLVITE) 1 MG tablet Take 1 mg by mouth daily.     Marland Kitchen gabapentin (NEURONTIN) 300 MG capsule Take 1 capsule (300 mg total) by mouth 2 (two) times daily. 60 capsule 5  . levothyroxine (SYNTHROID, LEVOTHROID) 75 MCG tablet Take 75 mcg by mouth daily before breakfast.     . loperamide (IMODIUM A-D) 2 MG tablet Take 2 mg by mouth 4 (four) times daily as needed for diarrhea or loose stools.    Marland Kitchen loratadine (CLARITIN) 10 MG tablet Take 10 mg by mouth daily.    . metoprolol succinate (TOPROL XL) 25 MG 24 hr tablet Take 0.5 tablets (12.5 mg total) by mouth daily. 45 tablet 3  . MONUROL 3 g PACK      . Multiple Minerals-Vitamins (CITRACAL PLUS) TABS 1 tablet    . Multiple Vitamin (MULTIVITAMIN WITH MINERALS) TABS tablet Take 1 tablet by mouth daily.    Marland Kitchen MYRBETRIQ 50 MG TB24 tablet Take 50 mg by mouth daily.    Marland Kitchen NAMZARIC 28-10 MG CP24 Take 1 capsule by mouth at bedtime. 30 capsule 5  . nystatin (MYCOSTATIN/NYSTOP) powder APPLY powder EXTERNALLY 2 TIMES DAILY AS NEEDED    . pravastatin (PRAVACHOL) 40 MG tablet Take 20 mg by mouth at bedtime.     Marland Kitchen ROCKLATAN 0.02-0.005 % SOLN Place 1 drop into both eyes at bedtime.    . sodium chloride 1 G tablet Take 1 tablet (1 g total) by mouth 2 (two) times daily with a meal. 60 tablet 0  .  triamcinolone cream (KENALOG) 0.1 % apply TO THE AFFECTED AREA 2 TIMES DAILY AS NEEDED FOR TO RASH     No current facility-administered medications on file prior to visit.     Allergies  Allergen Reactions  . Benadryl [Diphenhydramine Hcl] Hives and Shortness Of Breath  . Eye Drops [Tetrahydrozoline Hcl] Other (See Comments)    Burning, itching, swelling  . Flu Virus Vaccine Shortness Of Breath  . Iohexol Hives and Shortness Of Breath     Code: HIVES, Desc: PT IS ALLERGIC TO IVP DYE/IODINE/SHRIMP.  CAUSES SOB AND HIVES PER PT.  STEPHANIE, RT-R, Onset Date: [id number]   . Shrimp [Shellfish Allergy] Anaphylaxis  . Voltaren [Diclofenac Sodium] Other (See Comments)    Acid reflux symptoms  . Ambien [Zolpidem Tartrate] Hives  . Gluten Meal Diarrhea  . Travatan Z [Travoprost] Other (See Comments)    BRAND NAME ONLY OLCANNOT TOLERATE GENERIC FORM OF EYE DROPS, IT CONTAINS PRESERVATIVES-SENSITIVITY  . Ativan [Lorazepam]     delusional  . Hydrocodone     Only on the higher dosages  . Betadine Antibiotic-Moisturize [Bacitracin-Polymyxin B] Rash  . Biaxin [Clarithromycin] Hives  . Celebrex [Celecoxib] Rash  . Ciprofloxacin Hcl Rash       . Combigan [Brimonidine Tartrate-Timolol] Other (See Comments)    unknown  . Coumadin [Warfarin Sodium] Rash  . Ivp Dye  [Iodinated Diagnostic Agents] Hives  . Levaquin [Levofloxacin In D5w] Rash  . Naprosyn [Naproxen] Rash  . Pataday [Olopatadine Hcl] Other (See Comments)  . Penicillins Hives    Has patient had a PCN reaction causing immediate rash, facial/tongue/throat swelling, SOB or lightheadedness with hypotension: Yes Has patient had a PCN reaction causing severe rash involving mucus membranes or skin necrosis: No Has patient had a PCN reaction that required hospitalization No Has patient had a PCN reaction occurring within the last 10 years: No If all of the above answers are "NO", then may proceed with Cephalosporin use.   Marland Kitchen Plavix [Clopidogrel Bisulfate] Rash  . Septra [Sulfamethoxazole-Trimethoprim] Hives    Social History   Occupational History  . 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 Smoking status: Never Smoker  . Smokeless tobacco: Never Used  Substance and Sexual Activity  . Alcohol use: No    Comment: very moderate, none  in the last two months  . Drug use: No  . Sexual activity: Never    Family History  Problem Relation Age of Onset  . Diabetes Mother   . Hypertension Mother   . Glaucoma Mother   . CVA Mother   . CAD Mother   . Hypertension Father   . Heart attack Father   . Kidney failure Father   . Heart attack Brother        2 half brothers  . Hypertension Brother   . CVA Brother   . Glaucoma Other   . Glaucoma Other   . Prostate cancer Maternal Grandfather   . Breast cancer Maternal Aunt   . Colon cancer Maternal Uncle   . Rectal cancer Maternal Uncle      There is no immunization history on file for this patient.   Objective: There were no vitals filed for this visit.  [name] is a pleasant 86 y.o. Caucasian female, obese, in NAD. AAO X 3.  Vascular Examination: Capillary fill time to digits <3 seconds b/l lower extremities. Palpable pedal pulses b/l LE. Pedal hair absent. Lower  extremity skin temperature gradient within normal limits. No pain with calf compression b/l. Trace edema noted b/l lower extremities. No ischemia or gangrene noted b/l lower extremities.  Dermatological Examination: Pedal skin is thin shiny, atrophic b/l lower extremities. No open wounds bilaterally. No interdigital macerations bilaterally. Toenails 1-5 b/l elongated, discolored, dystrophic, thickened, crumbly with subungual debris and tenderness to dorsal palpation.  Musculoskeletal Examination: Normal muscle strength 5/5 to all lower extremity muscle groups bilaterally. No pain crepitus or joint limitation noted with ROM b/l. Hammertoes noted to the 2-5 bilaterally. Utilizes wheelchair for mobility assistance.  Neurological Examination: Protective sensation intact 5/5 intact bilaterally with 10g monofilament b/l. Vibratory sensation diminished b/l. Clonus negative b/l.  Hemoglobin A1C Latest Ref Rng & Units 04/26/2019  HGBA1C 4.8 - 5.6 % 5.6  Some recent data might be hidden   Assessment: 1. Pain due to onychomycosis of toenails of both feet   2. Acquired hammertoes of both feet     Plan: -Examined patient. -No new findings. No new orders. -Toenails 1-5 b/l were debrided in length and girth with sterile nail nippers and dremel without iatrogenic bleeding.  -Patient to report any pedal injuries to medical professional immediately. -Patient/POA to call should there be question/concern in the interim.  Return in about 3 months (around 06/05/2020).  Marzetta Board, DPM

## 2020-03-13 DIAGNOSIS — N183 Chronic kidney disease, stage 3 unspecified: Secondary | ICD-10-CM | POA: Diagnosis not present

## 2020-03-13 DIAGNOSIS — E7800 Pure hypercholesterolemia, unspecified: Secondary | ICD-10-CM | POA: Diagnosis not present

## 2020-03-13 DIAGNOSIS — D631 Anemia in chronic kidney disease: Secondary | ICD-10-CM | POA: Diagnosis not present

## 2020-03-13 DIAGNOSIS — I129 Hypertensive chronic kidney disease with stage 1 through stage 4 chronic kidney disease, or unspecified chronic kidney disease: Secondary | ICD-10-CM | POA: Diagnosis not present

## 2020-03-13 DIAGNOSIS — I480 Paroxysmal atrial fibrillation: Secondary | ICD-10-CM | POA: Diagnosis not present

## 2020-03-13 DIAGNOSIS — R7302 Impaired glucose tolerance (oral): Secondary | ICD-10-CM | POA: Diagnosis not present

## 2020-03-14 ENCOUNTER — Ambulatory Visit: Payer: Medicare Other | Attending: Critical Care Medicine

## 2020-03-14 DIAGNOSIS — Z23 Encounter for immunization: Secondary | ICD-10-CM

## 2020-03-14 NOTE — Progress Notes (Signed)
   Covid-19 Vaccination Clinic  Name:  [name]    MRN: [id number] DOB: [birth year]  03/14/2020  [name] was observed post Covid-19 immunization for 15 minutes without incident. She was provided with Vaccine Information Sheet and instruction to access the V-Safe system.   [name] was instructed to call 911 with any severe reactions post vaccine: Marland Kitchen Difficulty breathing  . Swelling of face and throat  . A fast heartbeat  . A bad rash all over body  . Dizziness and weakness   Immunizations Administered    Name Date Dose VIS Date Route   Moderna Covid-19 Booster Vaccine 03/14/2020 10:45 AM 0.25 mL 01/12/2020 Intramuscular   Manufacturer: Levan Hurst   Lot: [id number]   [place]: [id number]

## 2020-03-15 DIAGNOSIS — I129 Hypertensive chronic kidney disease with stage 1 through stage 4 chronic kidney disease, or unspecified chronic kidney disease: Secondary | ICD-10-CM | POA: Diagnosis not present

## 2020-03-15 DIAGNOSIS — E7800 Pure hypercholesterolemia, unspecified: Secondary | ICD-10-CM | POA: Diagnosis not present

## 2020-03-15 DIAGNOSIS — R7302 Impaired glucose tolerance (oral): Secondary | ICD-10-CM | POA: Diagnosis not present

## 2020-03-15 DIAGNOSIS — I480 Paroxysmal atrial fibrillation: Secondary | ICD-10-CM | POA: Diagnosis not present

## 2020-03-15 DIAGNOSIS — N183 Chronic kidney disease, stage 3 unspecified: Secondary | ICD-10-CM | POA: Diagnosis not present

## 2020-03-15 DIAGNOSIS — D631 Anemia in chronic kidney disease: Secondary | ICD-10-CM | POA: Diagnosis not present

## 2020-03-17 DIAGNOSIS — F32A Depression, unspecified: Secondary | ICD-10-CM | POA: Diagnosis not present

## 2020-03-17 DIAGNOSIS — D696 Thrombocytopenia, unspecified: Secondary | ICD-10-CM | POA: Diagnosis not present

## 2020-03-17 DIAGNOSIS — M810 Age-related osteoporosis without current pathological fracture: Secondary | ICD-10-CM | POA: Diagnosis not present

## 2020-03-17 DIAGNOSIS — F0281 Dementia in other diseases classified elsewhere with behavioral disturbance: Secondary | ICD-10-CM | POA: Diagnosis not present

## 2020-03-17 DIAGNOSIS — R7302 Impaired glucose tolerance (oral): Secondary | ICD-10-CM | POA: Diagnosis not present

## 2020-03-17 DIAGNOSIS — M479 Spondylosis, unspecified: Secondary | ICD-10-CM | POA: Diagnosis not present

## 2020-03-17 DIAGNOSIS — H409 Unspecified glaucoma: Secondary | ICD-10-CM | POA: Diagnosis not present

## 2020-03-17 DIAGNOSIS — E785 Hyperlipidemia, unspecified: Secondary | ICD-10-CM | POA: Diagnosis not present

## 2020-03-17 DIAGNOSIS — E039 Hypothyroidism, unspecified: Secondary | ICD-10-CM | POA: Diagnosis not present

## 2020-03-17 DIAGNOSIS — E7800 Pure hypercholesterolemia, unspecified: Secondary | ICD-10-CM | POA: Diagnosis not present

## 2020-03-17 DIAGNOSIS — M170 Bilateral primary osteoarthritis of knee: Secondary | ICD-10-CM | POA: Diagnosis not present

## 2020-03-17 DIAGNOSIS — F419 Anxiety disorder, unspecified: Secondary | ICD-10-CM | POA: Diagnosis not present

## 2020-03-17 DIAGNOSIS — I129 Hypertensive chronic kidney disease with stage 1 through stage 4 chronic kidney disease, or unspecified chronic kidney disease: Secondary | ICD-10-CM | POA: Diagnosis not present

## 2020-03-17 DIAGNOSIS — N183 Chronic kidney disease, stage 3 unspecified: Secondary | ICD-10-CM | POA: Diagnosis not present

## 2020-03-17 DIAGNOSIS — I872 Venous insufficiency (chronic) (peripheral): Secondary | ICD-10-CM | POA: Diagnosis not present

## 2020-03-17 DIAGNOSIS — R55 Syncope and collapse: Secondary | ICD-10-CM | POA: Diagnosis not present

## 2020-03-17 DIAGNOSIS — K5790 Diverticulosis of intestine, part unspecified, without perforation or abscess without bleeding: Secondary | ICD-10-CM | POA: Diagnosis not present

## 2020-03-17 DIAGNOSIS — K589 Irritable bowel syndrome without diarrhea: Secondary | ICD-10-CM | POA: Diagnosis not present

## 2020-03-17 DIAGNOSIS — I480 Paroxysmal atrial fibrillation: Secondary | ICD-10-CM | POA: Diagnosis not present

## 2020-03-17 DIAGNOSIS — I669 Occlusion and stenosis of unspecified cerebral artery: Secondary | ICD-10-CM | POA: Diagnosis not present

## 2020-03-17 DIAGNOSIS — E559 Vitamin D deficiency, unspecified: Secondary | ICD-10-CM | POA: Diagnosis not present

## 2020-03-17 DIAGNOSIS — G43909 Migraine, unspecified, not intractable, without status migrainosus: Secondary | ICD-10-CM | POA: Diagnosis not present

## 2020-03-17 DIAGNOSIS — D631 Anemia in chronic kidney disease: Secondary | ICD-10-CM | POA: Diagnosis not present

## 2020-03-17 DIAGNOSIS — K219 Gastro-esophageal reflux disease without esophagitis: Secondary | ICD-10-CM | POA: Diagnosis not present

## 2020-03-20 DIAGNOSIS — N183 Chronic kidney disease, stage 3 unspecified: Secondary | ICD-10-CM | POA: Diagnosis not present

## 2020-03-20 DIAGNOSIS — I129 Hypertensive chronic kidney disease with stage 1 through stage 4 chronic kidney disease, or unspecified chronic kidney disease: Secondary | ICD-10-CM | POA: Diagnosis not present

## 2020-03-20 DIAGNOSIS — E039 Hypothyroidism, unspecified: Secondary | ICD-10-CM | POA: Diagnosis not present

## 2020-03-20 DIAGNOSIS — D631 Anemia in chronic kidney disease: Secondary | ICD-10-CM | POA: Diagnosis not present

## 2020-03-20 DIAGNOSIS — I480 Paroxysmal atrial fibrillation: Secondary | ICD-10-CM | POA: Diagnosis not present

## 2020-03-20 DIAGNOSIS — F0281 Dementia in other diseases classified elsewhere with behavioral disturbance: Secondary | ICD-10-CM | POA: Diagnosis not present

## 2020-03-27 DIAGNOSIS — D631 Anemia in chronic kidney disease: Secondary | ICD-10-CM | POA: Diagnosis not present

## 2020-03-27 DIAGNOSIS — I129 Hypertensive chronic kidney disease with stage 1 through stage 4 chronic kidney disease, or unspecified chronic kidney disease: Secondary | ICD-10-CM | POA: Diagnosis not present

## 2020-03-27 DIAGNOSIS — F0281 Dementia in other diseases classified elsewhere with behavioral disturbance: Secondary | ICD-10-CM | POA: Diagnosis not present

## 2020-03-27 DIAGNOSIS — I480 Paroxysmal atrial fibrillation: Secondary | ICD-10-CM | POA: Diagnosis not present

## 2020-03-27 DIAGNOSIS — E039 Hypothyroidism, unspecified: Secondary | ICD-10-CM | POA: Diagnosis not present

## 2020-03-27 DIAGNOSIS — N183 Chronic kidney disease, stage 3 unspecified: Secondary | ICD-10-CM | POA: Diagnosis not present

## 2020-03-28 NOTE — Progress Notes (Signed)
COMMUNITY PALLIATIVE CARE RN NOTE  PATIENT NAME: [name] DOB: 07-04-1929 MRN: [id number]  PRIMARY CARE PROVIDER: Laurann Montana, MD  RESPONSIBLE PARTY: Virgel Bouquet (daughter) Acct ID - Guarantor Home Phone Work Phone Relationship Acct Type  [id number] ENJOLI, TIDD* [phone]  Self P/F     [address] [place], [place] [address]   Due to the COVID-19 crisis, this virtual check-in visit was done via telephone from my office and it was initiated and consent by this patient and or family.  PLAN OF CARE and INTERVENTION:  1. ADVANCE CARE PLANNING/GOALS OF CARE: Goal is for patient to remain in the home with her daughter. She has a DNR. 2. PATIENT/CAREGIVER EDUCATION: Symptom management, safe transfers, s/s of infection 3. DISEASE STATUS: Virtual check-in visit completed via telephone. Patient denies pain at this time. She had a Podiatry appointment earlier today. She was given bunny boots to help minimize aching in her feet. She is still receiving PT/OT/ST once weekly for each discipline. She is getting some stronger and using the sliding board with therapy, however is afraid to do this with family or hired caregivers for fear of falling. She is experiencing some wheezing with exertion and when lying flat in bed when being changed or turned/repositioned. They try to perform this task as quick as possible to minimize her shortness of breath. Daughter says that her PCP is aware of this. Two weeks ago, patient began experiencing burning with urination. A urinalysis was obtained, but there was so little bacteria that it wasn't felt that she required another course of antibiotics. She completed a course of antibiotics for UTI at the end of October. Her appetite is good, with good fluid intake. She is continent of bowel and bladder. She requires 1 person assistance with all ADLs except feeding. She still has issues with expressive aphasia. No skin issues. Will continue to monitor.                   HISTORY OF PRESENT ILLNESS: This is a 87 yo female with a history of PAF, essential hypertension, TIA, SIADH, hypothyroidism, chronic kidney disease and cognitive impairment. Palliative care team continues to follow patient and visits monthly and PRN.    CODE STATUS: DNR ADVANCED DIRECTIVES: Y MOST FORM: no PPS: 30%   (Duration of visit and documentation 30 minutes)   Candiss Norse, RN BSN

## 2020-03-30 DIAGNOSIS — E039 Hypothyroidism, unspecified: Secondary | ICD-10-CM | POA: Diagnosis not present

## 2020-03-30 DIAGNOSIS — F0281 Dementia in other diseases classified elsewhere with behavioral disturbance: Secondary | ICD-10-CM | POA: Diagnosis not present

## 2020-03-30 DIAGNOSIS — D631 Anemia in chronic kidney disease: Secondary | ICD-10-CM | POA: Diagnosis not present

## 2020-03-30 DIAGNOSIS — N183 Chronic kidney disease, stage 3 unspecified: Secondary | ICD-10-CM | POA: Diagnosis not present

## 2020-03-30 DIAGNOSIS — I480 Paroxysmal atrial fibrillation: Secondary | ICD-10-CM | POA: Diagnosis not present

## 2020-03-30 DIAGNOSIS — I129 Hypertensive chronic kidney disease with stage 1 through stage 4 chronic kidney disease, or unspecified chronic kidney disease: Secondary | ICD-10-CM | POA: Diagnosis not present

## 2020-03-31 DIAGNOSIS — N183 Chronic kidney disease, stage 3 unspecified: Secondary | ICD-10-CM | POA: Diagnosis not present

## 2020-03-31 DIAGNOSIS — I129 Hypertensive chronic kidney disease with stage 1 through stage 4 chronic kidney disease, or unspecified chronic kidney disease: Secondary | ICD-10-CM | POA: Diagnosis not present

## 2020-03-31 DIAGNOSIS — I480 Paroxysmal atrial fibrillation: Secondary | ICD-10-CM | POA: Diagnosis not present

## 2020-03-31 DIAGNOSIS — F0281 Dementia in other diseases classified elsewhere with behavioral disturbance: Secondary | ICD-10-CM | POA: Diagnosis not present

## 2020-03-31 DIAGNOSIS — D631 Anemia in chronic kidney disease: Secondary | ICD-10-CM | POA: Diagnosis not present

## 2020-03-31 DIAGNOSIS — E039 Hypothyroidism, unspecified: Secondary | ICD-10-CM | POA: Diagnosis not present

## 2020-04-04 DIAGNOSIS — N183 Chronic kidney disease, stage 3 unspecified: Secondary | ICD-10-CM | POA: Diagnosis not present

## 2020-04-04 DIAGNOSIS — I480 Paroxysmal atrial fibrillation: Secondary | ICD-10-CM | POA: Diagnosis not present

## 2020-04-04 DIAGNOSIS — F0281 Dementia in other diseases classified elsewhere with behavioral disturbance: Secondary | ICD-10-CM | POA: Diagnosis not present

## 2020-04-04 DIAGNOSIS — I129 Hypertensive chronic kidney disease with stage 1 through stage 4 chronic kidney disease, or unspecified chronic kidney disease: Secondary | ICD-10-CM | POA: Diagnosis not present

## 2020-04-04 DIAGNOSIS — D631 Anemia in chronic kidney disease: Secondary | ICD-10-CM | POA: Diagnosis not present

## 2020-04-04 DIAGNOSIS — E039 Hypothyroidism, unspecified: Secondary | ICD-10-CM | POA: Diagnosis not present

## 2020-04-05 ENCOUNTER — Other Ambulatory Visit: Payer: Self-pay | Admitting: Neurology

## 2020-04-05 DIAGNOSIS — D631 Anemia in chronic kidney disease: Secondary | ICD-10-CM | POA: Diagnosis not present

## 2020-04-05 DIAGNOSIS — I129 Hypertensive chronic kidney disease with stage 1 through stage 4 chronic kidney disease, or unspecified chronic kidney disease: Secondary | ICD-10-CM | POA: Diagnosis not present

## 2020-04-05 DIAGNOSIS — E039 Hypothyroidism, unspecified: Secondary | ICD-10-CM | POA: Diagnosis not present

## 2020-04-05 DIAGNOSIS — N183 Chronic kidney disease, stage 3 unspecified: Secondary | ICD-10-CM | POA: Diagnosis not present

## 2020-04-05 DIAGNOSIS — I480 Paroxysmal atrial fibrillation: Secondary | ICD-10-CM | POA: Diagnosis not present

## 2020-04-05 DIAGNOSIS — F0281 Dementia in other diseases classified elsewhere with behavioral disturbance: Secondary | ICD-10-CM | POA: Diagnosis not present

## 2020-04-06 DIAGNOSIS — D6869 Other thrombophilia: Secondary | ICD-10-CM | POA: Diagnosis not present

## 2020-04-06 DIAGNOSIS — F324 Major depressive disorder, single episode, in partial remission: Secondary | ICD-10-CM | POA: Diagnosis not present

## 2020-04-06 DIAGNOSIS — F0390 Unspecified dementia without behavioral disturbance: Secondary | ICD-10-CM | POA: Diagnosis not present

## 2020-04-06 DIAGNOSIS — N183 Chronic kidney disease, stage 3 unspecified: Secondary | ICD-10-CM | POA: Diagnosis not present

## 2020-04-06 DIAGNOSIS — L299 Pruritus, unspecified: Secondary | ICD-10-CM | POA: Diagnosis not present

## 2020-04-06 DIAGNOSIS — I480 Paroxysmal atrial fibrillation: Secondary | ICD-10-CM | POA: Diagnosis not present

## 2020-04-06 DIAGNOSIS — D638 Anemia in other chronic diseases classified elsewhere: Secondary | ICD-10-CM | POA: Diagnosis not present

## 2020-04-06 DIAGNOSIS — E785 Hyperlipidemia, unspecified: Secondary | ICD-10-CM | POA: Diagnosis not present

## 2020-04-06 DIAGNOSIS — M79672 Pain in left foot: Secondary | ICD-10-CM | POA: Diagnosis not present

## 2020-04-06 DIAGNOSIS — I129 Hypertensive chronic kidney disease with stage 1 through stage 4 chronic kidney disease, or unspecified chronic kidney disease: Secondary | ICD-10-CM | POA: Diagnosis not present

## 2020-04-07 DIAGNOSIS — N183 Chronic kidney disease, stage 3 unspecified: Secondary | ICD-10-CM | POA: Diagnosis not present

## 2020-04-07 DIAGNOSIS — I480 Paroxysmal atrial fibrillation: Secondary | ICD-10-CM | POA: Diagnosis not present

## 2020-04-07 DIAGNOSIS — E039 Hypothyroidism, unspecified: Secondary | ICD-10-CM | POA: Diagnosis not present

## 2020-04-07 DIAGNOSIS — F0281 Dementia in other diseases classified elsewhere with behavioral disturbance: Secondary | ICD-10-CM | POA: Diagnosis not present

## 2020-04-07 DIAGNOSIS — D631 Anemia in chronic kidney disease: Secondary | ICD-10-CM | POA: Diagnosis not present

## 2020-04-07 DIAGNOSIS — I129 Hypertensive chronic kidney disease with stage 1 through stage 4 chronic kidney disease, or unspecified chronic kidney disease: Secondary | ICD-10-CM | POA: Diagnosis not present

## 2020-04-11 ENCOUNTER — Other Ambulatory Visit: Payer: Self-pay

## 2020-04-11 ENCOUNTER — Other Ambulatory Visit: Payer: Medicare Other

## 2020-04-11 DIAGNOSIS — Z515 Encounter for palliative care: Secondary | ICD-10-CM

## 2020-04-11 NOTE — Progress Notes (Signed)
COMMUNITY PALLIATIVE CARE SW NOTE  PATIENT NAME: [name] DOB: 29-Aug-1929 MRN: [id number]  PRIMARY CARE PROVIDER: [name], MD  RESPONSIBLE PARTY:  Acct ID - Guarantor Home Phone Work Phone Relationship Acct Type  [id number] [name], [name] [phone]  Self P/F     [address] [place], [place] [address]   Due to the COVID-19, this visit was done via telephone from my office and it was initiated and consent by this patient and/or family.    PLAN OF CARE and INTERVENTIONS:             1. GOALS OF CARE/ ADVANCE CARE PLANNING:  Goal is for patient to remain in her daughter's home. Patient is a DNR 1.  2. SOCIAL/EMOTIONAL/SPIRITUAL ASSESSMENT/ INTERVENTIONS: SW completed a telephonic visit with patient's daughter-[name]. [name] provided a status update on patient. Patient continues to stabely decline. Patient's 1x week OT, PT and ST is at standstill as patient's dementia is progressing. [name] feels that patient may not be re- certified for therapies as she has not made much progress. Patient is scheduled to receive a bigger bed this week. She continues to have sitters daily from 9 am-1 pm. Patient had a face-to-face wellness check with her PCP last week and it went well. Patient is still transferred using a hoyer lift.[name] continue to participate in a support group, but will be seeing a therapist for more personal support. SW provided two additional therapist to consider for support, SW also reinforced ongoing support to her when needed, while normalizing her feelings. SW also provided supportive presence and resources, while assessing any needs of patient or PCG.  3. PATIENT/CAREGIVER EDUCATION/ COPING:  PCG have a supportive family, she attends a support group and is seeking additional personal support through counseling. Patient's dementia is progressing.  4. PERSONAL EMERGENCY PLAN:  911 can be activated for emergencies. 5. COMMUNITY RESOURCES COORDINATION/ HEALTH CARE NAVIGATION:   Always [facility] provide caregiver support to patient from 9 am-1 pm daily. 6. FINANCIAL/LEGAL CONCERNS/INTERVENTIONS:  None.     SOCIAL HX:  Social History   Tobacco Use  . Smoking status: Never Smoker  . Smokeless tobacco: Never Used  Substance Use Topics  . Alcohol use: No    Comment: very moderate, none  in the last two months    CODE STATUS: DNR ADVANCED DIRECTIVES: No MOST FORM COMPLETE:  No HOSPICE EDUCATION PROVIDED: No  PPS: Patient is bed bound and total care accept for feedings. She is alert and oriented to self, but pleasantly confused.   Duration of telephonic visit and documentation: 30 minutes      Denise Puller, LCSW

## 2020-04-13 DIAGNOSIS — E039 Hypothyroidism, unspecified: Secondary | ICD-10-CM | POA: Diagnosis not present

## 2020-04-13 DIAGNOSIS — I129 Hypertensive chronic kidney disease with stage 1 through stage 4 chronic kidney disease, or unspecified chronic kidney disease: Secondary | ICD-10-CM | POA: Diagnosis not present

## 2020-04-13 DIAGNOSIS — I480 Paroxysmal atrial fibrillation: Secondary | ICD-10-CM | POA: Diagnosis not present

## 2020-04-13 DIAGNOSIS — F0281 Dementia in other diseases classified elsewhere with behavioral disturbance: Secondary | ICD-10-CM | POA: Diagnosis not present

## 2020-04-13 DIAGNOSIS — D631 Anemia in chronic kidney disease: Secondary | ICD-10-CM | POA: Diagnosis not present

## 2020-04-13 DIAGNOSIS — N183 Chronic kidney disease, stage 3 unspecified: Secondary | ICD-10-CM | POA: Diagnosis not present

## 2020-04-16 DIAGNOSIS — M479 Spondylosis, unspecified: Secondary | ICD-10-CM | POA: Diagnosis not present

## 2020-04-16 DIAGNOSIS — D696 Thrombocytopenia, unspecified: Secondary | ICD-10-CM | POA: Diagnosis not present

## 2020-04-16 DIAGNOSIS — K5790 Diverticulosis of intestine, part unspecified, without perforation or abscess without bleeding: Secondary | ICD-10-CM | POA: Diagnosis not present

## 2020-04-16 DIAGNOSIS — F32A Depression, unspecified: Secondary | ICD-10-CM | POA: Diagnosis not present

## 2020-04-16 DIAGNOSIS — I480 Paroxysmal atrial fibrillation: Secondary | ICD-10-CM | POA: Diagnosis not present

## 2020-04-16 DIAGNOSIS — H409 Unspecified glaucoma: Secondary | ICD-10-CM | POA: Diagnosis not present

## 2020-04-16 DIAGNOSIS — K589 Irritable bowel syndrome without diarrhea: Secondary | ICD-10-CM | POA: Diagnosis not present

## 2020-04-16 DIAGNOSIS — M810 Age-related osteoporosis without current pathological fracture: Secondary | ICD-10-CM | POA: Diagnosis not present

## 2020-04-16 DIAGNOSIS — E785 Hyperlipidemia, unspecified: Secondary | ICD-10-CM | POA: Diagnosis not present

## 2020-04-16 DIAGNOSIS — D631 Anemia in chronic kidney disease: Secondary | ICD-10-CM | POA: Diagnosis not present

## 2020-04-16 DIAGNOSIS — F0281 Dementia in other diseases classified elsewhere with behavioral disturbance: Secondary | ICD-10-CM | POA: Diagnosis not present

## 2020-04-16 DIAGNOSIS — K219 Gastro-esophageal reflux disease without esophagitis: Secondary | ICD-10-CM | POA: Diagnosis not present

## 2020-04-16 DIAGNOSIS — I669 Occlusion and stenosis of unspecified cerebral artery: Secondary | ICD-10-CM | POA: Diagnosis not present

## 2020-04-16 DIAGNOSIS — E039 Hypothyroidism, unspecified: Secondary | ICD-10-CM | POA: Diagnosis not present

## 2020-04-16 DIAGNOSIS — R55 Syncope and collapse: Secondary | ICD-10-CM | POA: Diagnosis not present

## 2020-04-16 DIAGNOSIS — N183 Chronic kidney disease, stage 3 unspecified: Secondary | ICD-10-CM | POA: Diagnosis not present

## 2020-04-16 DIAGNOSIS — G43909 Migraine, unspecified, not intractable, without status migrainosus: Secondary | ICD-10-CM | POA: Diagnosis not present

## 2020-04-16 DIAGNOSIS — I129 Hypertensive chronic kidney disease with stage 1 through stage 4 chronic kidney disease, or unspecified chronic kidney disease: Secondary | ICD-10-CM | POA: Diagnosis not present

## 2020-04-16 DIAGNOSIS — E7800 Pure hypercholesterolemia, unspecified: Secondary | ICD-10-CM | POA: Diagnosis not present

## 2020-04-16 DIAGNOSIS — F419 Anxiety disorder, unspecified: Secondary | ICD-10-CM | POA: Diagnosis not present

## 2020-04-16 DIAGNOSIS — M170 Bilateral primary osteoarthritis of knee: Secondary | ICD-10-CM | POA: Diagnosis not present

## 2020-04-16 DIAGNOSIS — R7302 Impaired glucose tolerance (oral): Secondary | ICD-10-CM | POA: Diagnosis not present

## 2020-04-16 DIAGNOSIS — I872 Venous insufficiency (chronic) (peripheral): Secondary | ICD-10-CM | POA: Diagnosis not present

## 2020-04-16 DIAGNOSIS — E559 Vitamin D deficiency, unspecified: Secondary | ICD-10-CM | POA: Diagnosis not present

## 2020-04-19 DIAGNOSIS — I480 Paroxysmal atrial fibrillation: Secondary | ICD-10-CM | POA: Diagnosis not present

## 2020-04-19 DIAGNOSIS — F0281 Dementia in other diseases classified elsewhere with behavioral disturbance: Secondary | ICD-10-CM | POA: Diagnosis not present

## 2020-04-19 DIAGNOSIS — I129 Hypertensive chronic kidney disease with stage 1 through stage 4 chronic kidney disease, or unspecified chronic kidney disease: Secondary | ICD-10-CM | POA: Diagnosis not present

## 2020-04-19 DIAGNOSIS — D631 Anemia in chronic kidney disease: Secondary | ICD-10-CM | POA: Diagnosis not present

## 2020-04-19 DIAGNOSIS — N183 Chronic kidney disease, stage 3 unspecified: Secondary | ICD-10-CM | POA: Diagnosis not present

## 2020-04-19 DIAGNOSIS — E039 Hypothyroidism, unspecified: Secondary | ICD-10-CM | POA: Diagnosis not present

## 2020-04-21 DIAGNOSIS — D631 Anemia in chronic kidney disease: Secondary | ICD-10-CM | POA: Diagnosis not present

## 2020-04-21 DIAGNOSIS — I129 Hypertensive chronic kidney disease with stage 1 through stage 4 chronic kidney disease, or unspecified chronic kidney disease: Secondary | ICD-10-CM | POA: Diagnosis not present

## 2020-04-21 DIAGNOSIS — I480 Paroxysmal atrial fibrillation: Secondary | ICD-10-CM | POA: Diagnosis not present

## 2020-04-21 DIAGNOSIS — E039 Hypothyroidism, unspecified: Secondary | ICD-10-CM | POA: Diagnosis not present

## 2020-04-21 DIAGNOSIS — F0281 Dementia in other diseases classified elsewhere with behavioral disturbance: Secondary | ICD-10-CM | POA: Diagnosis not present

## 2020-04-21 DIAGNOSIS — N183 Chronic kidney disease, stage 3 unspecified: Secondary | ICD-10-CM | POA: Diagnosis not present

## 2020-04-26 DIAGNOSIS — D631 Anemia in chronic kidney disease: Secondary | ICD-10-CM | POA: Diagnosis not present

## 2020-04-26 DIAGNOSIS — I129 Hypertensive chronic kidney disease with stage 1 through stage 4 chronic kidney disease, or unspecified chronic kidney disease: Secondary | ICD-10-CM | POA: Diagnosis not present

## 2020-04-26 DIAGNOSIS — E039 Hypothyroidism, unspecified: Secondary | ICD-10-CM | POA: Diagnosis not present

## 2020-04-26 DIAGNOSIS — I480 Paroxysmal atrial fibrillation: Secondary | ICD-10-CM | POA: Diagnosis not present

## 2020-04-26 DIAGNOSIS — N183 Chronic kidney disease, stage 3 unspecified: Secondary | ICD-10-CM | POA: Diagnosis not present

## 2020-04-26 DIAGNOSIS — F0281 Dementia in other diseases classified elsewhere with behavioral disturbance: Secondary | ICD-10-CM | POA: Diagnosis not present

## 2020-04-28 DIAGNOSIS — N183 Chronic kidney disease, stage 3 unspecified: Secondary | ICD-10-CM | POA: Diagnosis not present

## 2020-04-28 DIAGNOSIS — I480 Paroxysmal atrial fibrillation: Secondary | ICD-10-CM | POA: Diagnosis not present

## 2020-04-28 DIAGNOSIS — D631 Anemia in chronic kidney disease: Secondary | ICD-10-CM | POA: Diagnosis not present

## 2020-04-28 DIAGNOSIS — E039 Hypothyroidism, unspecified: Secondary | ICD-10-CM | POA: Diagnosis not present

## 2020-04-28 DIAGNOSIS — F0281 Dementia in other diseases classified elsewhere with behavioral disturbance: Secondary | ICD-10-CM | POA: Diagnosis not present

## 2020-04-28 DIAGNOSIS — I129 Hypertensive chronic kidney disease with stage 1 through stage 4 chronic kidney disease, or unspecified chronic kidney disease: Secondary | ICD-10-CM | POA: Diagnosis not present

## 2020-05-03 DIAGNOSIS — I129 Hypertensive chronic kidney disease with stage 1 through stage 4 chronic kidney disease, or unspecified chronic kidney disease: Secondary | ICD-10-CM | POA: Diagnosis not present

## 2020-05-03 DIAGNOSIS — E039 Hypothyroidism, unspecified: Secondary | ICD-10-CM | POA: Diagnosis not present

## 2020-05-03 DIAGNOSIS — I480 Paroxysmal atrial fibrillation: Secondary | ICD-10-CM | POA: Diagnosis not present

## 2020-05-03 DIAGNOSIS — F0281 Dementia in other diseases classified elsewhere with behavioral disturbance: Secondary | ICD-10-CM | POA: Diagnosis not present

## 2020-05-03 DIAGNOSIS — D631 Anemia in chronic kidney disease: Secondary | ICD-10-CM | POA: Diagnosis not present

## 2020-05-03 DIAGNOSIS — N183 Chronic kidney disease, stage 3 unspecified: Secondary | ICD-10-CM | POA: Diagnosis not present

## 2020-05-05 DIAGNOSIS — F0281 Dementia in other diseases classified elsewhere with behavioral disturbance: Secondary | ICD-10-CM | POA: Diagnosis not present

## 2020-05-05 DIAGNOSIS — D631 Anemia in chronic kidney disease: Secondary | ICD-10-CM | POA: Diagnosis not present

## 2020-05-05 DIAGNOSIS — I129 Hypertensive chronic kidney disease with stage 1 through stage 4 chronic kidney disease, or unspecified chronic kidney disease: Secondary | ICD-10-CM | POA: Diagnosis not present

## 2020-05-05 DIAGNOSIS — E039 Hypothyroidism, unspecified: Secondary | ICD-10-CM | POA: Diagnosis not present

## 2020-05-05 DIAGNOSIS — I480 Paroxysmal atrial fibrillation: Secondary | ICD-10-CM | POA: Diagnosis not present

## 2020-05-05 DIAGNOSIS — N183 Chronic kidney disease, stage 3 unspecified: Secondary | ICD-10-CM | POA: Diagnosis not present

## 2020-05-08 DIAGNOSIS — D631 Anemia in chronic kidney disease: Secondary | ICD-10-CM | POA: Diagnosis not present

## 2020-05-08 DIAGNOSIS — I129 Hypertensive chronic kidney disease with stage 1 through stage 4 chronic kidney disease, or unspecified chronic kidney disease: Secondary | ICD-10-CM | POA: Diagnosis not present

## 2020-05-08 DIAGNOSIS — I480 Paroxysmal atrial fibrillation: Secondary | ICD-10-CM | POA: Diagnosis not present

## 2020-05-08 DIAGNOSIS — F0281 Dementia in other diseases classified elsewhere with behavioral disturbance: Secondary | ICD-10-CM | POA: Diagnosis not present

## 2020-05-08 DIAGNOSIS — E039 Hypothyroidism, unspecified: Secondary | ICD-10-CM | POA: Diagnosis not present

## 2020-05-08 DIAGNOSIS — N183 Chronic kidney disease, stage 3 unspecified: Secondary | ICD-10-CM | POA: Diagnosis not present

## 2020-05-09 DIAGNOSIS — I129 Hypertensive chronic kidney disease with stage 1 through stage 4 chronic kidney disease, or unspecified chronic kidney disease: Secondary | ICD-10-CM | POA: Diagnosis not present

## 2020-05-09 DIAGNOSIS — E039 Hypothyroidism, unspecified: Secondary | ICD-10-CM | POA: Diagnosis not present

## 2020-05-09 DIAGNOSIS — D631 Anemia in chronic kidney disease: Secondary | ICD-10-CM | POA: Diagnosis not present

## 2020-05-09 DIAGNOSIS — F0281 Dementia in other diseases classified elsewhere with behavioral disturbance: Secondary | ICD-10-CM | POA: Diagnosis not present

## 2020-05-09 DIAGNOSIS — I480 Paroxysmal atrial fibrillation: Secondary | ICD-10-CM | POA: Diagnosis not present

## 2020-05-09 DIAGNOSIS — N183 Chronic kidney disease, stage 3 unspecified: Secondary | ICD-10-CM | POA: Diagnosis not present

## 2020-05-10 ENCOUNTER — Telehealth: Payer: Self-pay | Admitting: *Deleted

## 2020-05-10 DIAGNOSIS — E039 Hypothyroidism, unspecified: Secondary | ICD-10-CM | POA: Diagnosis not present

## 2020-05-10 DIAGNOSIS — D631 Anemia in chronic kidney disease: Secondary | ICD-10-CM | POA: Diagnosis not present

## 2020-05-10 DIAGNOSIS — I480 Paroxysmal atrial fibrillation: Secondary | ICD-10-CM | POA: Diagnosis not present

## 2020-05-10 DIAGNOSIS — F0281 Dementia in other diseases classified elsewhere with behavioral disturbance: Secondary | ICD-10-CM | POA: Diagnosis not present

## 2020-05-10 DIAGNOSIS — N183 Chronic kidney disease, stage 3 unspecified: Secondary | ICD-10-CM | POA: Diagnosis not present

## 2020-05-10 DIAGNOSIS — I129 Hypertensive chronic kidney disease with stage 1 through stage 4 chronic kidney disease, or unspecified chronic kidney disease: Secondary | ICD-10-CM | POA: Diagnosis not present

## 2020-05-10 NOTE — Telephone Encounter (Signed)
Called and left a voicemail with patient's daughter, [name], to schedule a palliative care home visit. Contact information left for return call.

## 2020-05-11 ENCOUNTER — Telehealth: Payer: Self-pay | Admitting: *Deleted

## 2020-05-11 NOTE — Telephone Encounter (Signed)
Return phone call received from patient's daughter [name] to schedule a palliative care home visit. Visit scheduled for 05/17/20@11a .

## 2020-05-17 ENCOUNTER — Other Ambulatory Visit: Payer: Self-pay

## 2020-05-17 ENCOUNTER — Other Ambulatory Visit: Payer: Medicare Other | Admitting: *Deleted

## 2020-05-17 VITALS — BP 122/70 | HR 62 | Temp 97.8°F | Resp 17

## 2020-05-17 DIAGNOSIS — Z515 Encounter for palliative care: Secondary | ICD-10-CM

## 2020-05-22 ENCOUNTER — Other Ambulatory Visit: Payer: Self-pay | Admitting: Neurology

## 2020-05-23 ENCOUNTER — Ambulatory Visit: Payer: Medicare Other | Admitting: Neurology

## 2020-05-23 NOTE — Progress Notes (Signed)
COMMUNITY PALLIATIVE CARE RN NOTE  PATIENT NAME: KAALIYAH KITA DOB: 1929-08-13 MRN: [id number]  PRIMARY CARE PROVIDER: [name], MD  RESPONSIBLE PARTY: Rush Landmark (daughter) Acct ID - Guarantor Home Phone Work Phone Relationship Acct Type  [id number] JENNIFIER, SMITHERMAN* [phone]  Self P/F     [address] [place], [place] [address]   Covid-19 Pre-screening Negative  PLAN OF CARE and INTERVENTION:  1. ADVANCE CARE PLANNING/GOALS OF CARE: Goal is to remain in the home with her daughter. She has a DNR. 2. PATIENT/CAREGIVER EDUCATION: Symptom management, safe transfers, s/s of infection 3. DISEASE STATUS: Met with patient, daughter [name], and son-in law in their home. Upon arrival, she is sitting up in her recliner awake and alert, talking with a family member on the phone. She is pleasant and engaging. She continues with word-finding difficulties. She denies pain at this time, but does experiencing some soreness at times in her left heel. Daughter states her heel was very red on Sunday, but has progressively gotten better. She has been applying Aspercreme and making sure her heels are floated while in bed at all times. Daughter is not noticing as much patient wheezing. Wheezing and some shortness of breath mainly occurs when she is laid flat when turned and repositioned. She is not kept in this position long. She completed PT/OT and ST last week. Daughter feels she is stronger in regards to bed mobility, but she is still not standing. She has received a fully automatic hospital bed that is also wider in size to help with ease of turning and repositioning. She has not noticed much difference in her speech since ST, which was mainly for her aphasia. She continues to be transferred via [place]. She requires 1 person assistance with bathing and dressing. She is able to feed herself independently. Her appetite is good. Denies dysphagia. She is sleeping well throughout the night with the use of a  generic brand of Zzzquil. She is incontinent of both bowel and bladder and wears adult briefs. No issues with bowel movements. Desitin is being applied after each incontinent episode. No skin breakdown. She does have occasional redness under her breast. She denies any UTI symptoms. Will continue to monitor.   HISTORY OF PRESENT ILLNESS: This is a 87 yo female with a history of PAF, essential hypertension, TIA, SIADH, hypothyroidism, chronic kidney disease and cognitive impairment.Palliative care team continues to follow patient and visits monthly and PRN.     CODE STATUS: DNR ADVANCED DIRECTIVES: Y MOST FORM: no PPS: 30%   PHYSICAL EXAM:   VITALS: Today's Vitals   05/17/20 1145  BP: 122/70  Pulse: 62  Resp: 17  Temp: 97.8 F (36.6 C)  TempSrc: Temporal  SpO2: 94%  PainSc: 0-No pain    LUNGS: clear to auscultation  CARDIAC: Cor RRR EXTREMITIES: No edema SKIN: Exposed skin is dry and intact  NEURO: Alert and oriented x 2, intermittent confusion, forgetful, generalized weakness, non-ambulatory   (Duration of visit and documentation 45 minutes)   Daryl Eastern, RN BSN

## 2020-06-05 NOTE — Progress Notes (Signed)
Virtual Visit via Video Note The purpose of this virtual visit is to provide medical care while limiting exposure to the novel coronavirus.    Consent was obtained for video visit:  Yes.   Answered questions that patient had about telehealth interaction:  Yes.   I discussed the limitations, risks, security and privacy concerns of performing an evaluation and management service by telemedicine. I also discussed with the patient that there may be a patient responsible charge related to this service. The patient expressed understanding and agreed to proceed.  Pt location: Home Physician Location: office Name of referring provider:  [name], MD I connected with Veverly Fells at patients initiation/request on 06/06/2020 at  3:30 PM EDT by video enabled telemedicine application and verified that I am speaking with the correct person using two identifiers. Pt MRN:  [id number] Pt DOB:  28-Sep-1929 Video Participants:  Veverly Fells;  Assessment and Plan:   1.  Major neurocognitive disorder, likely mixed Alzheimer's and vascular.  2.  Right-sided postherpetic trigeminal neuralgia  1.  Refilled Namzaric for dementia 2. Refilled carbamazepine 100mg  BID and gabapentin 300mg  BID for neuralgia 3.  Having labs checked at her PCP's office next month.  Requested to have results faxed to me. 4.  Follow up 9 months  History of Present Illness:  [name] is an 87 year old right-handed female with hypertension, CAD, atrial fibrillation, hypercholesterolemia, type 2 diabetes mellitus, migraine, anxiety, sleep apnea and history of stroke with residual right-sided weakness, DVT and PE (on Lovenox) who follows up for syncope, mixed Alzheimer's and vascular dementia and right post-herpetic trigeminal neuralgia. She is accompanied by her daughter who supplements history.  1.DEMENTIA: History: Memory deficits started around 2007. It was fairly sudden onset and has not progressed.She tends to  repeat herself often, such as telling stories. She repeats questions often. She reports word-finding difficulties as well.She does have difficulty using the phone and managing her finances. She typically lives by herself in her one story house. She manages her own finances. She doesn't drive 2 to remote history of blackout spells. She typically does not have problems recognizing people or recalling names. She denies any depression. She denies any hallucinations. She does have history of vivid nightmares.She reports a maternal aunt with history of dementia. To evaluate cognitive problems, an MRI of the brain was performed on 12/01/13, which revealed extensive small vessel ischemic changes throughout the deep and subcortical white matter, including remote small infarcts involving the cerebellum and left basal ganglia.Chronic micro-hemorrhages also noted, most notably in the right frontal lobe. No acute findings noted.  In 2020, she has had a decline in memory due to recurrent UTIs and a fall in which she fractured her femur.  For several years, she has episodes of recurrent speech hesitancy.She knows what she wants to say but has trouble getting the words out.In the past, it has occurred in setting of UTI.To evaluate episodes of transient speech dysfunction, EEG was performed on 03/10/14, which was normal.She went to speech therapy without any real improvement. Therapist thinks it is likely medication-related.  She underwent neuropsychological testing on 01/12/15. Testing was consistent with mild dementia. She exhibited cognitive dysfunction involving the medial-temporal lobe, which is suggestive of Alzheimer's disease. However, family endorses abrupt onset, which correlates with vascular etiology. Repeat MRI of the brain performed 02/22/15, stable compared to prior imaging from 12/01/13. She had repeat neurocognitive testing on 01/02/16. Cognitive deficits/mild dementia stable compared to one  year ago.  Update:  She takes Namzaric.She is still living with her daughter and her family.  She has caregivers from 9 to 1 everyday and then she is cared for by family afterwards.  In wheelchair.  Requires a hyer lift to get out of bed.  Overall memory and speech stable.  Underwent PT/OT which improved core strength.  Able to wash her upper body independently.  3RIGHT-SIDED POST-HERPETIC TRIGEMINAL NEURALGIA: History: Onset of symptoms started in 2006, following episode of herpes zoster involving she describes sharp pain involving the the right V1 and V2 distribution, associated with allodynia.Chewing, talking and brushing her teeth triggers and aggravates the pain.She was subsequently treated for postherpetic neuralgia.   Update: Current medication:Carbamazepine 100mg  twice daily,gabapentin 300mg  twice daily.  Every now and then she has a paroxysmal shooting pain above the right eye.    4.  SYNCOPE: History: She had a couple of episodes of syncope that occurred while transferring from her bed to the chair. There was associated pain during these transfers. When she was transferred to the wheelchair, her head rolled over with eyes open, tongue hanging out but not responsive to verbal stimuli. She had urinary incontinence. No associated convulsive movement. [facility] another syncopal event in setting of transfer from bed to wheelchair. She steadily returned to baseline after 3 to 5 minutes. A similar event happened before during transfer from bed to chair. Orthostatic vitals were unremarkable. CT head showed no acute intracranial abnormality. MRI of brain without contrast showed stable atrophy, chronic small vessel ischemic changes with chronic microhemorrhages and remote lacunar infarcts in cerebellum and basal ganglia but no new or acute abnormality. MRA of head showed no large vessel stenosis or occlusion.  Echocardiogram showed EF 60-65% with no cardiac source of embolus. Carotid ultrasound showed 40-59% right ICA stenosis, 1-39% left ICA stenosis and antegrade flow in both vertebral arteries. Vasovagal syncope was suspected. Since then, [place] has been involved. She is receiving PT, OT and speech therapy. There is an aid that helps her with bathing. She now has a [employer] fast streamer to make transfer easier.   Update:  EEG on 05/26/2019 showed diffuse background slowing.   Past Medical History: Past Medical History:  Diagnosis Date  . A-fib ([place])   . Acid reflux   . Anxiety   . Arthritis   . Chronic anticoagulation 03/07/2013   Lovenox 100 QD  . Chronic kidney disease    Stage III  . CVA (cerebral infarction) 1964  . Dementia [facility])    Dr Brett Fairy  . Diverticulosis   . DVT (deep venous thrombosis) ([place]) 2007   recurrent w PE s/p [place] filter  . GERD (gastroesophageal reflux disease)    w hiatal hernia endoscopy 2003, Dr Penelope Coop  . Glaucoma   . Hypercholesterolemia   . Hypertension   . Hypoglycemia   . Hypothyroidism    goiter  . IBS (irritable bowel syndrome)   . Junctional bradycardia    resolved with discontinuation of sotalol (2014) after 10 yrs  . Knee injury   . Major depression single episode, in partial remission ([place]) 03/07/2020  . Memory loss   . Migraine   . Osteoarthritis    in Bilateral knees and back. -Dr Latanya Maudlin  . Osteoporosis   . Panic attacks   . Phlebitis   . Postherpetic trigeminal neuralgia 10/20/2012  . Pulmonary embolism recurrent    IVC filter  . Shingles    zoster 1990 on the back and on the  abdomen in 2007,facial zoster 2006  . Trigeminal neuralgia    r face--Dr Dohmeier  . Unspecified cerebral artery occlusion with cerebral infarction   . Vitamin D deficiency     Medications: Outpatient Encounter Medications as of 06/06/2020  Medication Sig  . acetaminophen (TYLENOL) 500 MG tablet 2 tablets  . carbamazepine (CARBATROL) 100 MG  12 hr capsule Take 1 capsule (100 mg total) by mouth 2 (two) times daily.  . cephALEXin (KEFLEX) 250 MG capsule Take 250 mg by mouth daily.  . cholecalciferol (VITAMIN D) 1000 units tablet Take 2,000 Units by mouth daily.   . clindamycin (CLEOCIN) 150 MG capsule 2 capsules 1 hour prior and 1 capsule 6 hours after dental appt----vena cava filter for hx of blood clots  . ELIQUIS 5 MG TABS tablet Take 5 mg by mouth 2 (two) times daily.  Marland Kitchen EPINEPHrine (EPIPEN 2-PAK) 0.3 mg/0.3 mL IJ SOAJ injection Inject 0.3 mg into the muscle as needed for anaphylaxis.  Marland Kitchen Fe Fum-FA-B Cmp-C-Zn-Mg-Mn-Cu (HEMOCYTE PLUS) 106-1 MG CAPS 1 capsule between meals  . feeding supplement, ENSURE ENLIVE, (ENSURE ENLIVE) LIQD Take 237 mLs by mouth daily at 3 pm.  . fluticasone (FLONASE) 50 MCG/ACT nasal spray Place 1 spray into left nostril daily as needed for allergies or rhinitis.   . folic acid (FOLVITE) 1 MG tablet Take 1 mg by mouth daily.   Marland Kitchen gabapentin (NEURONTIN) 300 MG capsule Take 1 capsule (300 mg total) by mouth 2 (two) times daily.  Marland Kitchen levothyroxine (SYNTHROID, LEVOTHROID) 75 MCG tablet Take 75 mcg by mouth daily before breakfast.   . loperamide (IMODIUM A-D) 2 MG tablet Take 2 mg by mouth 4 (four) times daily as needed for diarrhea or loose stools.  Marland Kitchen loratadine (CLARITIN) 10 MG tablet Take 10 mg by mouth daily.  . metoprolol succinate (TOPROL XL) 25 MG 24 hr tablet Take 0.5 tablets (12.5 mg total) by mouth daily.  Marland Kitchen MONUROL 3 g PACK   . Multiple Minerals-Vitamins (CITRACAL PLUS) TABS 1 tablet  . Multiple Vitamin (MULTIVITAMIN WITH MINERALS) TABS tablet Take 1 tablet by mouth daily.  Marland Kitchen MYRBETRIQ 50 MG TB24 tablet Take 50 mg by mouth daily.  Marland Kitchen NAMZARIC 28-10 MG CP24 TAKE 1 CAPSULE BY MOUTH AT BEDTIME  . nystatin (MYCOSTATIN/NYSTOP) powder APPLY powder EXTERNALLY 2 TIMES DAILY AS NEEDED  . pravastatin (PRAVACHOL) 40 MG tablet Take 20 mg by mouth at bedtime.   Marland Kitchen ROCKLATAN 0.02-0.005 % SOLN Place 1 drop into both eyes  at bedtime.  . sodium chloride 1 G tablet Take 1 tablet (1 g total) by mouth 2 (two) times daily with a meal.  . triamcinolone cream (KENALOG) 0.1 % apply TO THE AFFECTED AREA 2 TIMES DAILY AS NEEDED FOR TO RASH   No facility-administered encounter medications on file as of 06/06/2020.    Allergies: Allergies  Allergen Reactions  . Benadryl [Diphenhydramine Hcl] Hives and Shortness Of Breath  . Eye Drops [Tetrahydrozoline Hcl] Other (See Comments)    Burning, itching, swelling  . Influenza Virus Vaccine Shortness Of Breath  . Iohexol Hives and Shortness Of Breath     Code: HIVES, Desc: PT IS ALLERGIC TO IVP DYE/IODINE/SHRIMP.  CAUSES SOB AND HIVES PER PT.  STEPHANIE, RT-R, Onset Date: [id number]   . Shrimp [Shellfish Allergy] Anaphylaxis  . Voltaren [Diclofenac Sodium] Other (See Comments)    Acid reflux symptoms  . Ambien [Zolpidem Tartrate] Hives  . Gluten Meal Diarrhea  . Travatan Z [Travoprost] Other (See Comments)  BRAND NAME ONLY OLCANNOT TOLERATE GENERIC FORM OF EYE DROPS, IT CONTAINS PRESERVATIVES-SENSITIVITY  . Ativan [Lorazepam]     delusional  . Hydrocodone     Only on the higher dosages  . Betadine Antibiotic-Moisturize [Bacitracin-Polymyxin B] Rash  . Biaxin [Clarithromycin] Hives  . Celebrex [Celecoxib] Rash  . Ciprofloxacin Hcl Rash       . Combigan [Brimonidine Tartrate-Timolol] Other (See Comments)    unknown  . Coumadin [Warfarin Sodium] Rash  . Ivp Dye [Iodinated Diagnostic Agents] Hives  . Levaquin [Levofloxacin In D5w] Rash  . Naprosyn [Naproxen] Rash  . Pataday [Olopatadine Hcl] Other (See Comments)  . Penicillins Hives    Has patient had a PCN reaction causing immediate rash, facial/tongue/throat swelling, SOB or lightheadedness with hypotension: Yes Has patient had a PCN reaction causing severe rash involving mucus membranes or skin necrosis: No Has patient had a PCN reaction that required hospitalization No Has patient had a PCN reaction occurring  within the last 10 years: No If all of the above answers are "NO", then may proceed with Cephalosporin use.   Marland Kitchen Plavix [Clopidogrel Bisulfate] Rash  . Septra [Sulfamethoxazole-Trimethoprim] Hives    Family History: Family History  Problem Relation Age of Onset  . Diabetes Mother   . Hypertension Mother   . Glaucoma Mother   . CVA Mother   . CAD Mother   . Hypertension Father   . Heart attack Father   . Kidney failure Father   . Heart attack Brother        2 half brothers  . Hypertension Brother   . CVA Brother   . Glaucoma Other   . Glaucoma Other   . Prostate cancer Maternal Grandfather   . Breast cancer Maternal Aunt   . Colon cancer Maternal Uncle   . Rectal cancer Maternal Uncle     Observations/Objective:   Height 5\' 2"  (1.575 m), weight 225 lb (102.1 kg). No acute distress.  Alert and oriented.  Some hesitancy of speech but overall speech fluent and not dysarthric.  Language intact.     Follow Up Instructions:    -I discussed the assessment and treatment plan with the patient. The patient was provided an opportunity to ask questions and all were answered. The patient agreed with the plan and demonstrated an understanding of the instructions.   The patient was advised to call back or seek an in-person evaluation if the symptoms worsen or if the condition fails to improve as anticipated.    Dudley Major, DO

## 2020-06-06 ENCOUNTER — Encounter: Payer: Self-pay | Admitting: Neurology

## 2020-06-06 ENCOUNTER — Other Ambulatory Visit: Payer: Self-pay

## 2020-06-06 ENCOUNTER — Telehealth (INDEPENDENT_AMBULATORY_CARE_PROVIDER_SITE_OTHER): Payer: Medicare Other | Admitting: Neurology

## 2020-06-06 VITALS — Ht 62.0 in | Wt 225.0 lb

## 2020-06-06 DIAGNOSIS — F0280 Dementia in other diseases classified elsewhere without behavioral disturbance: Secondary | ICD-10-CM | POA: Diagnosis not present

## 2020-06-06 DIAGNOSIS — B0222 Postherpetic trigeminal neuralgia: Secondary | ICD-10-CM | POA: Diagnosis not present

## 2020-06-06 DIAGNOSIS — G309 Alzheimer's disease, unspecified: Secondary | ICD-10-CM

## 2020-06-06 DIAGNOSIS — F0150 Vascular dementia without behavioral disturbance: Secondary | ICD-10-CM

## 2020-06-06 MED ORDER — GABAPENTIN 300 MG PO CAPS: 300.0000 mg | ORAL_CAPSULE | Freq: Two times a day (BID) | ORAL | 5 refills | Status: DC

## 2020-06-06 MED ORDER — CARBAMAZEPINE ER 100 MG PO CP12: 100.0000 mg | ORAL_CAPSULE | Freq: Two times a day (BID) | ORAL | 5 refills | Status: AC

## 2020-06-06 MED ORDER — NAMZARIC 28-10 MG PO CP24: 1.0000 | ORAL_CAPSULE | Freq: Every day | ORAL | 5 refills | Status: AC

## 2020-06-13 ENCOUNTER — Other Ambulatory Visit: Payer: Self-pay

## 2020-06-13 ENCOUNTER — Encounter: Payer: Self-pay | Admitting: Podiatry

## 2020-06-13 ENCOUNTER — Ambulatory Visit (INDEPENDENT_AMBULATORY_CARE_PROVIDER_SITE_OTHER): Payer: Medicare Other | Admitting: Podiatry

## 2020-06-13 DIAGNOSIS — M79675 Pain in left toe(s): Secondary | ICD-10-CM | POA: Diagnosis not present

## 2020-06-13 DIAGNOSIS — M79674 Pain in right toe(s): Secondary | ICD-10-CM | POA: Diagnosis not present

## 2020-06-13 DIAGNOSIS — M2042 Other hammer toe(s) (acquired), left foot: Secondary | ICD-10-CM

## 2020-06-13 DIAGNOSIS — B351 Tinea unguium: Secondary | ICD-10-CM | POA: Diagnosis not present

## 2020-06-13 DIAGNOSIS — D689 Coagulation defect, unspecified: Secondary | ICD-10-CM

## 2020-06-13 DIAGNOSIS — M2041 Other hammer toe(s) (acquired), right foot: Secondary | ICD-10-CM

## 2020-06-18 NOTE — Progress Notes (Signed)
Subjective: [name] presents today for at risk foot care with h/o clotting disorder and painful thick toenails that are difficult to trim. Pain interferes with ambulation. Aggravating factors include wearing enclosed shoe gear. Pain is relieved with periodic professional debridement.   Daughter is present during today's visit. They voice no new pedal concerns on today's visit.  [name], MD is patient's PCP. Last visit was 02/25/2020.  Allergies  Allergen Reactions  . Benadryl [Diphenhydramine Hcl] Hives and Shortness Of Breath  . Eye Drops [Tetrahydrozoline Hcl] Other (See Comments)    Burning, itching, swelling  . Influenza Virus Vaccine Shortness Of Breath  . Iohexol Hives and Shortness Of Breath     Code: HIVES, Desc: PT IS ALLERGIC TO IVP DYE/IODINE/SHRIMP.  CAUSES SOB AND HIVES PER PT.  STEPHANIE, RT-R, Onset Date: [id number]   . Shrimp [Shellfish Allergy] Anaphylaxis  . Voltaren [Diclofenac Sodium] Other (See Comments)    Acid reflux symptoms  . Ambien [Zolpidem Tartrate] Hives  . Gluten Meal Diarrhea  . Travatan Z [Travoprost] Other (See Comments)    BRAND NAME ONLY OLCANNOT TOLERATE GENERIC FORM OF EYE DROPS, IT CONTAINS PRESERVATIVES-SENSITIVITY  . Ativan [Lorazepam]     delusional  . Duloxetine Hcl     Other reaction(s): zombie  . Furosemide     Other reaction(s): dizzy/lightheaded  . Hydrocodone     Only on the higher dosages  . Hydrocodone-Acetaminophen     Other reaction(s): only the 7.5/325mg  dose  out of mind  . Ibandronic Acid     Other reaction(s): jerking  . Other     Other reaction(s): Unknown Other reaction(s): rash Other reaction(s): reflux Other reaction(s): Unknown Other reaction(s): rash Other reaction(s): Unknown Other reaction(s): head felt full Other reaction(s): Unknown Other reaction(s): rash Other reaction(s): rash  . Betadine Antibiotic-Moisturize [Bacitracin-Polymyxin B] Rash  . Biaxin [Clarithromycin] Hives  . Celebrex  [Celecoxib] Rash  . Ciprofloxacin Hcl Rash       . Combigan [Brimonidine Tartrate-Timolol] Other (See Comments)    unknown  . Coumadin [Warfarin Sodium] Rash  . Ivp Dye [Iodinated Diagnostic Agents] Hives  . Levaquin [Levofloxacin In D5w] Rash  . Naprosyn [Naproxen] Rash  . Pataday [Olopatadine Hcl] Other (See Comments)  . Penicillins Hives    Has patient had a PCN reaction causing immediate rash, facial/tongue/throat swelling, SOB or lightheadedness with hypotension: Yes Has patient had a PCN reaction causing severe rash involving mucus membranes or skin necrosis: No Has patient had a PCN reaction that required hospitalization No Has patient had a PCN reaction occurring within the last 10 years: No If all of the above answers are "NO", then may proceed with Cephalosporin use.   Marland Kitchen Plavix [Clopidogrel Bisulfate] Rash  . Septra [Sulfamethoxazole-Trimethoprim] Hives   Objective: There were no vitals filed for this visit.  [name] is a pleasant 87 y.o. Caucasian female, obese, in NAD. AAO X 3.  Vascular Examination: Capillary fill time to digits <3 seconds b/l lower extremities. Palpable pedal pulses b/l LE. Pedal hair absent. Lower extremity skin temperature gradient within normal limits. No pain with calf compression b/l. Trace edema noted b/l lower extremities. No ischemia or gangrene noted b/l lower extremities.  Dermatological Examination: Pedal skin is thin shiny, atrophic b/l lower extremities. No open wounds bilaterally. No interdigital macerations bilaterally. Toenails 1-5 b/l elongated, discolored, dystrophic, thickened, crumbly with subungual debris and tenderness to dorsal palpation.  Musculoskeletal Examination: Normal muscle strength 5/5 to all lower extremity muscle groups bilaterally. No pain crepitus  or joint limitation noted with ROM b/l. Hammertoes noted to the 2-5 bilaterally. Utilizes wheelchair for mobility assistance.  Neurological  Examination: Protective sensation intact 5/5 intact bilaterally with 10g monofilament b/l. Vibratory sensation diminished b/l. Clonus negative b/l.  Assessment: 1. Pain due to onychomycosis of toenails of both feet   2. Acquired hammertoes of both feet   3. Blood clotting disorder ([place])     Plan: -Examined patient. -No new findings. No new orders. -Patient to continue soft, supportive shoe gear daily. -Toenails 1-5 b/l were debrided in length and girth with sterile nail nippers and dremel without iatrogenic bleeding.  -Patient to report any pedal injuries to medical professional immediately. -Patient/POA to call should there be question/concern in the interim.  Return in about 3 months (around 09/13/2020).  Marzetta Board, DPM

## 2020-06-20 IMAGING — MR MR MRA HEAD W/O CM
2 of 4 series · 14 of 48 positions shown · non-contrast
Comparison: 03/09/2019

CLINICAL DATA: Right-sided weakness with facial droop and slurred
speech

EXAM:
MRI HEAD WITHOUT CONTRAST
MRA HEAD WITHOUT CONTRAST
TECHNIQUE: Multiplanar, multiecho pulse sequences of the brain and surrounding
structures were obtained without intravenous contrast. Angiographic
images of the head were obtained using MRA technique without
contrast.

[Series 5: DWI · coronal · 4.0mm · 0.88mm/px · 9 of 64 slices shown (1 of 2)]
[im 1/64]
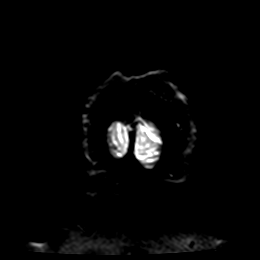
[im 8/64]
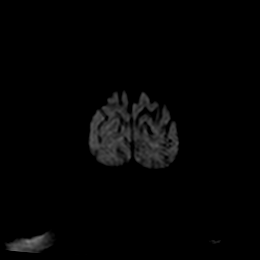
[im 16/64]
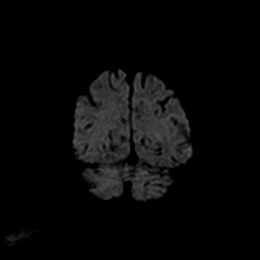
[im 24/64]
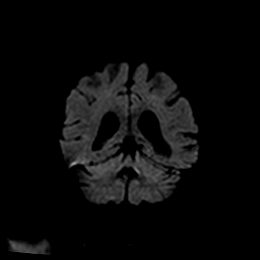
[im 32/64]
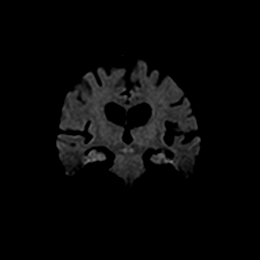
[im 40/64]
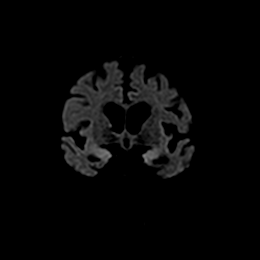
[im 48/64]
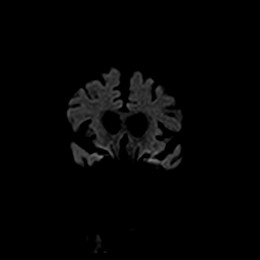
[im 56/64]
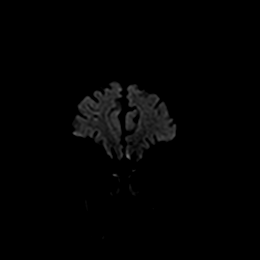
[im 64/64]
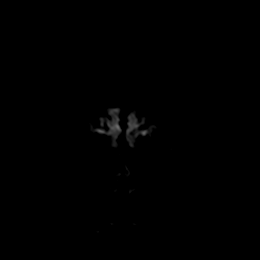

[Series 6: DWI · coronal · 4.0mm · 0.88mm/px · 5 of 32 slices shown (2 of 2)]
[im 1/32]
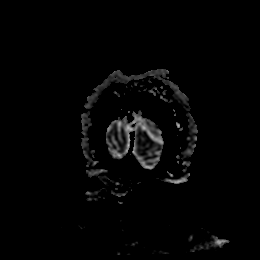
[im 8/32]
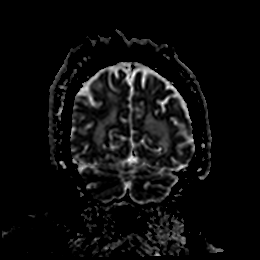
[im 16/32]
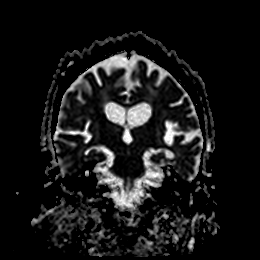
[im 24/32]
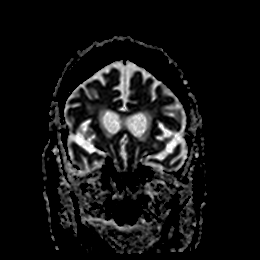
[im 32/32]
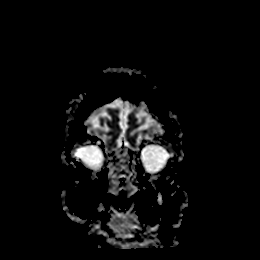

[14 of 48 positions shown; findings below may reference images not displayed]

FINDINGS: MRI HEAD FINDINGS

BRAIN: There is no acute infarct, acute hemorrhage or extra-axial
collection. Early confluent hyperintense T2-weighted signal of the
periventricular and deep white matter, most commonly due to chronic
ischemic microangiopathy. There is generalized atrophy without lobar
predilection. Blood-sensitive sequences show multiple chronic
peripheral predominant microhemorrhages, unchanged. The midline
structures are normal. Old cerebellar and basal ganglia small vessel
infarcts.

SKULL AND UPPER CERVICAL SPINE: The visualized skull base,
calvarium, upper cervical spine and extracranial soft tissues are
normal.

SINUSES/ORBITS: No fluid levels or advanced mucosal thickening. No
mastoid or middle ear effusion. The orbits are normal.

MRA HEAD FINDINGS

POSTERIOR CIRCULATION:

--Basilar artery: Normal.

--Posterior cerebral arteries: Normal. Both originate from the
basilar artery.

--Superior cerebellar arteries: Normal.

--Inferior cerebellar arteries: Normal anterior and posterior
inferior cerebellar arteries.

ANTERIOR CIRCULATION:

--Intracranial internal carotid arteries: Normal.

--Anterior cerebral arteries: Normal. Both A1 segments are present.
Patent anterior communicating artery.

--Middle cerebral arteries: Normal.

--Posterior communicating arteries: Present bilaterally.
IMPRESSION: 1. No acute intracranial abnormality. Unchanged appearance of old
cerebellar and basal ganglia small vessel infarcts.
2. Chronic ischemic microangiopathy, generalized volume loss and
multiple chronic microhemorrhages, unchanged since 03/09/2019.
[DATE]. Normal intracranial MRA.

## 2020-06-22 ENCOUNTER — Telehealth: Payer: Self-pay | Admitting: *Deleted

## 2020-06-22 NOTE — Telephone Encounter (Signed)
Called and left a voicemail with patient's daughter, [name], to schedule a palliative care home visit. Left contact information for return call.

## 2020-07-04 ENCOUNTER — Telehealth: Payer: Self-pay | Admitting: *Deleted

## 2020-07-04 ENCOUNTER — Other Ambulatory Visit: Payer: Self-pay

## 2020-07-04 ENCOUNTER — Other Ambulatory Visit: Payer: Medicare Other | Admitting: *Deleted

## 2020-07-04 DIAGNOSIS — Z515 Encounter for palliative care: Secondary | ICD-10-CM

## 2020-07-04 NOTE — Progress Notes (Signed)
COMMUNITY PALLIATIVE CARE RN NOTE  PATIENT NAME: [name] DOB: 1930/03/17 MRN: [id number]  PRIMARY CARE PROVIDER: [name], MD  RESPONSIBLE PARTY: Rush Landmark (daughter) Acct ID - Guarantor Home Phone Work Phone Relationship Acct Type  [id number] [employer], WESTRA* [phone]  Self P/F     [address] [place], [place] [address]   Due to the COVID-19 crisis, this virtual check-in visit was done via telephone from my office and it was initiated and consent by this patient and or family.  PLAN OF CARE and INTERVENTION:  1. ADVANCE CARE PLANNING/GOALS OF CARE: Goal is for patient to remain in the home with her daughter. She has a DNR. 2. PATIENT/CAREGIVER EDUCATION: Symptom management, safe transfers, s/s of infection 3. DISEASE STATUS: Virtual check-in visit completed via telephone. Daughter states that she is noticing changes/decline in patient's overall cognitive status. She continues with word-finding difficulties. Denies any pain issues at this time. She requires 1 person assistance with bathing, dressing and transfers. She is transferred via [place]. She is having more difficulties manipulating dining utensils so they are offering more finger foods. They want her to be able to do as much as she can for herself as long a possible. Her appetite is good. She is incontinent of both bowel and bladder and wears Adult briefs. Barrier cream is applied after each incontinent episode. No skin issues. Denies any UTI symptoms. She is sleeping well during the night. She has an upcoming routine appointment with her PCP on 07/06/20. Will continue to monitor.   HISTORY OF PRESENT ILLNESS: This is a 87 yo female with a history of PAF, essential hypertension, TIA, SIADH, hypothyroidism, chronic kidney disease and cognitive impairment.Palliative care team continues to follow patient and visits monthly and PRN.   CODE STATUS: DNR ADVANCED DIRECTIVES: Y MOST FORM: no PPS: 30%   (Duration of visit  and documentation 20 minutes)   Daryl Eastern, RN BSN

## 2020-07-04 NOTE — Telephone Encounter (Signed)
Called patient's daughter, [name], to schedule a palliative care home visit. Left contact information for return call.

## 2020-07-06 DIAGNOSIS — E559 Vitamin D deficiency, unspecified: Secondary | ICD-10-CM | POA: Diagnosis not present

## 2020-07-06 DIAGNOSIS — E785 Hyperlipidemia, unspecified: Secondary | ICD-10-CM | POA: Diagnosis not present

## 2020-07-06 DIAGNOSIS — E039 Hypothyroidism, unspecified: Secondary | ICD-10-CM | POA: Diagnosis not present

## 2020-07-06 DIAGNOSIS — I82409 Acute embolism and thrombosis of unspecified deep veins of unspecified lower extremity: Secondary | ICD-10-CM | POA: Diagnosis not present

## 2020-07-06 DIAGNOSIS — D638 Anemia in other chronic diseases classified elsewhere: Secondary | ICD-10-CM | POA: Diagnosis not present

## 2020-07-06 DIAGNOSIS — D696 Thrombocytopenia, unspecified: Secondary | ICD-10-CM | POA: Diagnosis not present

## 2020-07-06 DIAGNOSIS — E871 Hypo-osmolality and hyponatremia: Secondary | ICD-10-CM | POA: Diagnosis not present

## 2020-07-06 DIAGNOSIS — D6869 Other thrombophilia: Secondary | ICD-10-CM | POA: Diagnosis not present

## 2020-07-06 DIAGNOSIS — F0390 Unspecified dementia without behavioral disturbance: Secondary | ICD-10-CM | POA: Diagnosis not present

## 2020-07-06 DIAGNOSIS — Z0000 Encounter for general adult medical examination without abnormal findings: Secondary | ICD-10-CM | POA: Diagnosis not present

## 2020-07-06 DIAGNOSIS — N183 Chronic kidney disease, stage 3 unspecified: Secondary | ICD-10-CM | POA: Diagnosis not present

## 2020-07-06 DIAGNOSIS — I480 Paroxysmal atrial fibrillation: Secondary | ICD-10-CM | POA: Diagnosis not present

## 2020-07-14 DIAGNOSIS — R300 Dysuria: Secondary | ICD-10-CM | POA: Diagnosis not present

## 2020-07-31 ENCOUNTER — Other Ambulatory Visit: Payer: Self-pay | Admitting: Neurology

## 2020-08-16 DIAGNOSIS — H401133 Primary open-angle glaucoma, bilateral, severe stage: Secondary | ICD-10-CM | POA: Diagnosis not present

## 2020-08-22 ENCOUNTER — Other Ambulatory Visit: Payer: Medicare Other

## 2020-08-22 ENCOUNTER — Other Ambulatory Visit: Payer: Self-pay

## 2020-08-22 DIAGNOSIS — Z515 Encounter for palliative care: Secondary | ICD-10-CM

## 2020-08-22 NOTE — Progress Notes (Signed)
COMMUNITY PALLIATIVE CARE SW NOTE  PATIENT NAME: [name] DOB: Jul 29, 1929 MRN: [id number]  PRIMARY CARE PROVIDER: [name], MD  RESPONSIBLE PARTY:  Acct ID - Guarantor Home Phone Work Phone Relationship Acct Type  [id number] Denise, Bishop* [phone]  Self P/F     [phone] [place], [place] [address]   Due to the COVID-19 crisis, this virtual check-in visit was done via telephone from my office and it was initiated and consent by this patient and or family  PLAN OF CARE and INTERVENTIONS:             1. GOALS OF CARE/ ADVANCE CARE PLANNING:  Goal is for patient to be placed in a SNF. Patient is a DNR. 2. SOCIAL/EMOTIONAL/SPIRITUAL ASSESSMENT/ INTERVENTIONS:  SW completed a telephonic visit with patient's daughter-[name]. [name] advised that patient memory and cognitive status continues to decline. Patient is unable to follow commands. Patient repeats herself and asks the same questions over and over. Her blood pressure has been good. Her appetite has also been exceptionally good as patient generally eats all of her meals. Physically, patient is also difficult to roll and get up to complete personal care and ADL's for the daughter and caregivers. Patient is weaker overall, but again her cognitive state is making it difficult. [name] advised that she is looking for a SNF for patient. Along with looking at some facilities in [place], she will also be exploring facilities in [place], which would be closer to her brothers. [name] expressed that she is fatigued and she needs a break for caregiving. She feels that she has done it for as long as she could. She wants her brothers to take over, but she will need to move to [place] in order for this to happen. SW normalized her feelings of caregiver fatigue. SW also provided resources for SNF facilities to consider in [place]. SW provided active listening, supportive presence, supportive counseling, while assessing patient's needs and comfort,  and coping/needs of caregiver. 3. PATIENT/CAREGIVER EDUCATION/ COPING:  Patient and PCG is coping adequately. PCG is experiencing caregiver fatigue. 4. PERSONAL EMERGENCY PLAN:  911 can be activated for emergencies. 5. COMMUNITY RESOURCES COORDINATION/ HEALTH CARE NAVIGATION:  Patient has hired caregivers through [facility]. 6. FINANCIAL/LEGAL CONCERNS/INTERVENTIONS:  None.     SOCIAL HX:  Social History   Tobacco Use  . Smoking status: Never Smoker  . Smokeless tobacco: Never Used  Substance Use Topics  . Alcohol use: No    Comment: very moderate, none  in the last two months    CODE STATUS: DNR ADVANCED DIRECTIVES: Yes MOST FORM COMPLETE:  No HOSPICE EDUCATION PROVIDED: No  PPS: Patient is bed bound and total care except for feedings. She is alert and oriented to self, but pleasantly confused. Her confusion and ability to follow commands are more profound. PCG is considering placing patient in a SNF.  Duration of telephonic visit and documentation: 30 minutes      Katheren Puller, LCSW

## 2020-08-24 ENCOUNTER — Telehealth: Payer: Self-pay

## 2020-08-24 NOTE — Telephone Encounter (Signed)
I connected by phone with Veverly Fells and/or patient's caregiver on 08/24/2020 at 12:19 PM to discuss the potential vaccination through our Homebound vaccination initiative.   Prevaccination Checklist for COVID-19 Vaccines  1.  Are you feeling sick today? no  2.  Have you ever received a dose of a COVID-19 vaccine?  yes      If yes, which one? Moderna   How many dose of Covid-19 vaccine have your received and dates ? 3, 04/12/2019, 05/10/2019, 03/14/2020   Check all that apply: I live in a long-term care setting. no  I have been diagnosed with a medical condition(s). Please list: _______________________ (pertinent to homebound status)  I am a first responder. no  I work in a long-term care facility, correctional facility, hospital, restaurant, retail setting, school, or other setting with high exposure to the public. no  4. Do you have a health condition or are you undergoing treatment that makes you moderately or severely immunocompromised? (This would include treatment for cancer or HIV, receipt of organ transplant, immunosuppressive therapy or high-dose corticosteroids, CAR-T-cell therapy, hematopoietic cell transplant [HCT], DiGeorge syndrome or Wiskott-Aldrich syndrome)  no  5. Have you received hematopoietic cell transplant (HCT) or CAR-T-cell therapies since receiving COVID-19 vaccine? no  6.  Have you ever had an allergic reaction: (This would include a severe reaction [ e.g., anaphylaxis] that required treatment with epinephrine or EpiPen or that caused you to go to the hospital.  It would also include an allergic reaction that occurred within 4 hours that caused hives, swelling, or respiratory distress, including wheezing.) A.  A previous dose of COVID-19 vaccine. no  B.  A vaccine or injectable therapy that contains multiple components, one of which is a COVID-19 vaccine component, but it is not known which component elicited the immediate reaction. no  C.  Are you allergic to  polyethylene glycol? no  D. Are you allergic to Polysorbate, which is found in some vaccines, film coated tablets and intravenous steroids?  no   7.  Have you ever had an allergic reaction to another vaccine (other than COVID-19 vaccine) or an injectable medication? (This would include a severe reaction [ e.g., anaphylaxis] that required treatment with epinephrine or EpiPen or that caused you to go to the hospital.  It would also include an allergic reaction that occurred within 4 hours that caused hives, swelling, or respiratory distress, including wheezing.)  yes   8.  Have you ever had a severe allergic reaction (e.g., anaphylaxis) to something other than a component of the COVID-19 vaccine, or any vaccine or injectable medication?  This would include food, pet, venom, environmental, or oral medication allergies.  yes, patient has allergies to PCN, shellfish and multiple allergies to several different medications, also allergic to Influenza virus vaccine.  Check all that apply to you:  Am a female between ages 99 and 35 years old  no  Women 51 through 85 years of age can receive any FDA-authorized or -approved COVID-19 vaccine. However, they should be informed of the rare but increased risk of thrombosis with thrombocytopenia syndrome (TTS) after receipt of the [employer] Vaccine and the availability of other FDA-authorized and -approved COVID-19 vaccines. People who had TTS after a first dose of Janssen vaccine should not receive a subsequent dose of Janssen product    Am a female between ages 35 and 63 years old  no Males 5 through 85 years of age may receive the correct formulation of Pfizer-BioNTech COVID-19 vaccine. Males  18 and older can receive any FDA-authorized or -approved vaccine. However, people receiving an mRNA COVID-19 vaccine, especially males 11 through 85 years of age and their parents/legal representative (when relevant), should be informed of the risk of developing myocarditis  (an inflammation of the heart muscle) or pericarditis (inflammation of the lining around the heart) after receipt of an mRNA vaccine. The risk of developing either myocarditis or pericarditis after vaccination is low, and lower than the risk of myocarditis associated with SARS-CoV-2 infection in adolescents and adults. Vaccine recipients should be counseled about the need to seek care if symptoms of myocarditis or pericarditis develop after vaccination     Have a history of myocarditis or pericarditis  no Myocarditis or pericarditis after receipt of the first dose of an mRNA COVID-19 vaccine series but before administration of the second dose  Experts advise that people who develop myocarditis or pericarditis after a dose of an mRNA COVID-19 vaccine not receive a subsequent dose of any COVID-19 vaccine, until additional safety data are available.  Administration of a subsequent dose of COVID-19 vaccine before safety data are available can be considered in certain circumstances after the episode of myocarditis or pericarditis has completely resolved. Until additional data are available, some experts recommend a Alphonsa Overall COVID-19 vaccine be considered instead of an mRNA COVID-19 vaccine. Decisions about proceeding with a subsequent dose should include a conversation between the patient, their parent/legal representative (when relevant), and their clinical team, which may include a cardiologist.    Have been treated with monoclonal antibodies or convalescent serum to prevent or treat COVID-19  no Vaccination should be offered to people regardless of history of prior symptomatic or asymptomatic SARS-CoV-2 infection. There is no recommended minimal interval between infection and vaccination.  However, vaccination should be deferred if a patient received monoclonal antibodies or convalescent serum as treatment for COVID-19 or for post-exposure prophylaxis. This is a precautionary measure until additional  information becomes available, to avoid interference of the antibody treatment with vaccine-induced immune responses.  Defer COVID-19 vaccination for 30 days when a passive antibody product was used for post-exposure prophylaxis.  Defer COVID-19 vaccination for 90 days when a passive antibody product was used to treat COVID-19.     Diagnosed with Multisystem Inflammatory Syndrome (MIS-C or MIS-A) after a COVID-19 infection  no It is unknown if people with a history of MIS-C or MIS-A are at risk for a dysregulated immune response to COVID-19 vaccination.  People with a history of MIS-C or MIS-A may choose to be vaccinated. Considerations for vaccination may include:   Clinical recovery from MIS-C or MIS-A, including return to normal cardiac function   Personal risk of severe acute COVID-19 (e.g., age, underlying conditions)   High or substantial community transmission of SARS-CoV-2 and personal increased risk of reinfection.   Timing of any immunomodulatory therapies (general best practice guidelines for immunization can be consulted for more information [email or web address])   It has been 90 days or more since their diagnosis of MIS-C   Onset of MIS-C occurred before any COVID-19 vaccination   A conversation between the patient, their guardian(s), and their clinical team or a specialist may assist with COVID-19 vaccination decisions. Healthcare providers and health departments may also request a consultation from the [facility] at [email or web address].     Have a bleeding disorder  no Take a blood thinner  yes , reports taking Eliquis. As with all vaccines, any COVID-19 vaccine product may be given to these patients,  if a physician familiar with the patient's bleeding risk determines that the vaccine can be administered intramuscularly with reasonable safety.  ACIP  recommends the following technique for intramuscular vaccination in patients with bleeding disorders or taking blood thinners: a fine-gauge needle (23-gauge or smaller caliber) should be used for the vaccination, followed by firm pressure on the site, without rubbing, for at least 2 minutes.  People who regularly take aspirin or anticoagulants as part of their routine medications do not need to stop these medications prior to receipt of any COVID-19 vaccine.    Have a history of heparin-induced thrombocytopenia (HIT)  no Although the etiology of TTS associated with the Alphonsa Overall COVID-19 vaccine is unclear, it appears to be similar to another rare immune-mediated syndrome, heparin-induced thrombocytopenia (HIT). People with a history of an episode of an immune-mediated syndrome characterized by thrombosis and thrombocytopenia, such as HIT, should be offered a currently FDA-approved or FDA-authorized mRNA COVID-19 vaccine if it has been ?90 days since their TTS resolved. After 90 days, patients may be vaccinated with any currently FDA-approved or FDA-authorized COVID-19 vaccine, including Janssen COVID-19 Vaccine. However, people who developed TTS after their initial Alphonsa Overall vaccine should not receive a Janssen booster dose.  Experts believe the following factors do not make people more susceptible to TTS after receipt of the [employer]. People with these conditions can be vaccinated with any FDA-authorized or - approved COVID-19 vaccine, including the [employer] COVID-19 Vaccine:   A prior history of venous thromboembolism   Risk factors for venous thromboembolism (e.g., inherited or acquired thrombophilia including Factor V Leiden; prothrombin gene 20210A mutation; antiphospholipid syndrome; protein C, protein S or antithrombin deficiency   A prior history of other types of thromboses not associated with thrombocytopenia   Pregnancy, post-partum status, or receipt of hormonal contraceptives  (e.g., combined oral contraceptives, patch, ring)   Additional recipient education materials can be found at http://gutierrez-robinson.com/ vaccines/safety/JJUpdate.html.    Am currently pregnant or breastfeeding  no Vaccination is recommended for all people aged 102 years and older, including people that are:   Pregnant   Breastfeeding   Trying to get pregnant now or who might become pregnant in the future   Pregnant, breastfeeding, and post-partum people 48 through 85 years of age should be aware of the rare risk of TTS after receipt of the Alphonsa Overall COVID-19 Vaccine and the availability of other FDA-authorized or -approved COVID-19 vaccines (i.e., mRNA vaccines).    Have received dermal fillers  no FDA-authorized or -approved COVID-19 vaccines can be administered to people who have received injectable dermal fillers who have no contraindications for vaccination.  Infrequently, these people might experience temporary swelling at or near the site of filler injection (usually the face or lips) following administration of a dose of an mRNA COVID-19 vaccine. These people should be advised to contact their healthcare provider if swelling develops at or near the site of dermal filler following vaccination.     Have a history of Guillain-Barr Syndrome (GBS)  no People with a history of GBS can receive any FDA-authorized or -approved COVID-19 vaccine. However, given the possible association between the [employer] and an increased risk of GBS, a patient with a history of GBS and their clinical team should discuss the availability of mRNA vaccines to offer protection against COVID-19. The highest risk has been observed in men aged 24-64 years with symptoms of GBS beginning within 42 days after Alphonsa Overall COVID-19 vaccination.  People who had GBS after receiving Janssen vaccine  should be made aware of the option to receive an mRNA COVID-19 vaccine booster at least 2 months (8 weeks) after the  Janssen dose. However, Alphonsa Overall vaccine may be used as a booster, particularly if GBS occurred more than 42 days after vaccination or was related to a non-vaccine factor. Prior to booster vaccination, a conversation between the patient and their clinical team may assist with decisions about use of a COVID-19 booster dose, including the timing of administration     Postvaccination Observation Times for People without Contraindications to Covid 19 Vaccination.  30 minutes:  People with a history of: A contraindication to another type of COVID-19 vaccine product (i.e., mRNA or viral vector COVID-19 vaccines)   Immediate (within 4 hours of exposure) non-severe allergic reaction to a COVID-19 vaccine or injectable therapies   Anaphylaxis due to any cause   Immediate allergic reaction of any severity to a non-COVID-19 vaccine   15 minutes: All other people  This patient is a 87 y.o. female that meets the FDA criteria to receive homebound vaccination. Patient or parent/caregiver understands they have the option to accept or refuse homebound vaccination.  Patient passed the pre-screening checklist and would like to proceed with homebound vaccination.  Based on questionnaire above, I recommend the patient be observed for 30 minutes.  There are an estimated #3 other household members/caregivers who are also interested in receiving the vaccine.    The patient has been confirmed homebound and eligible for homebound vaccination with the considerations outlined above. I will send the patient's information to our scheduling team who will reach out to schedule the patient and potential caregiver/family members for homebound vaccination.    Dan Humphreys 08/24/2020 12:19 PM

## 2020-09-06 ENCOUNTER — Ambulatory Visit: Payer: Medicare Other | Attending: Critical Care Medicine

## 2020-09-06 ENCOUNTER — Other Ambulatory Visit: Payer: Self-pay

## 2020-09-06 DIAGNOSIS — Z23 Encounter for immunization: Secondary | ICD-10-CM

## 2020-09-06 NOTE — Progress Notes (Signed)
   Covid-19 Vaccination Clinic  Name:  [name]    MRN: [id number] DOB: Jun 18, 1929  09/06/2020  [name] was observed post Covid-19 immunization for 15 minutes without incident. She was provided with Vaccine Information Sheet and instruction to access the V-Safe system.   [name] was instructed to call 911 with any severe reactions post vaccine: Difficulty breathing  Swelling of face and throat  A fast heartbeat  A bad rash all over body  Dizziness and weakness   Immunizations Administered     Name Date Dose VIS Date Route   Moderna Covid-19 Booster Vaccine 09/06/2020 10:25 AM 0.25 mL 01/12/2020 Intramuscular   Manufacturer: Moderna   Lot: [id number]   [place]: [id number]

## 2020-09-07 ENCOUNTER — Telehealth: Payer: Self-pay | Admitting: *Deleted

## 2020-09-07 NOTE — Telephone Encounter (Signed)
Called and left a voicemail with patient's daughter, [name], to schedule a palliative care visit. Left contact information for return call.

## 2020-09-18 ENCOUNTER — Other Ambulatory Visit: Payer: Self-pay

## 2020-09-18 ENCOUNTER — Other Ambulatory Visit: Payer: Medicare Other | Admitting: *Deleted

## 2020-09-18 DIAGNOSIS — Z515 Encounter for palliative care: Secondary | ICD-10-CM

## 2020-09-19 ENCOUNTER — Telehealth: Payer: Self-pay

## 2020-09-19 NOTE — Telephone Encounter (Signed)
09/19/20 @ 1:45 PM: Palliaivr care SW outreached patients daughter, [name], per [facility] RN to address placement questions/concerns for patient.    Call Unsuccessful. SW LVM. Awaiting return call.

## 2020-09-19 NOTE — Progress Notes (Signed)
COMMUNITY PALLIATIVE CARE RN NOTE  PATIENT NAME: [name] DOB: Apr 13, 1929 MRN: [id number]  PRIMARY CARE PROVIDER: [name], MD  RESPONSIBLE PARTY: Rush Landmark (daughter) Acct ID - Guarantor Home Phone Work Phone Relationship Acct Type  [id number] AMEAH, CHANDA* [phone]  Self P/F     [phone] [place], [place] [address]   Due to the COVID-19 crisis, this virtual check-in visit was done via telephone from my office and it was initiated and consent by this patient and or family.  PLAN OF CARE and INTERVENTION:  ADVANCE CARE PLANNING/GOALS OF CARE: Daughter is in the process of finding SNF placement for patient. She has a DNR. PATIENT/CAREGIVER EDUCATION: Symptom management, safe transfers, s/s of infection DISEASE STATUS: Virtual check-in visit completed via telephone. Patient denies pain at this time. Daughter states that patient continues to decline cognitively which in turn is affecting her ability to perform certain tasks, such as assisting with turning and repositioning in bed. Daughter states that it is becoming more difficult to care for patient and is currently looking into SNF placement. They had a recent family discussion and they agreed to find a place for patient to move in [place] vs out of town. She asked for some guidance. I advised that she will need to obtain a FL2 from her PCP. She also had questions regarding help with financial planning. I advised I will reach out to our palliative MSW to assist her with some resources and I did. Patient continues with a good appetite. She continues to require 1 person assistance with all ADLS, and is transferred via Yvonne Stopher. She is able to feed herself finger foods independently. No recent changes have been made to her medication regimen. She is incontinent of both bowel and bladder and wears adult briefs. They continue to apply barrier cream after each incontinent episode. No skin issues at this time and no recent s/s of  UTI. Will continue to monitor.   HISTORY OF PRESENT ILLNESS: This is a 87 yo female with a history of PAF, essential hypertension, TIA, SIADH, hypothyroidism, chronic kidney disease and cognitive impairment. Palliative care team continues to follow patient and visits monthly and PRN   CODE STATUS: DNR  ADVANCED DIRECTIVES: Y MOST FORM: no PPS: 30%   (Duration of visit and documentation 30 minutes)   Daryl Eastern, RN BSN

## 2020-09-20 ENCOUNTER — Telehealth: Payer: Self-pay

## 2020-09-20 NOTE — Telephone Encounter (Signed)
09/20/20 @ 4:58 PM : INCOMING CALL :  Patients daughter, susan outreached SW to discuss placement options for patient.  Daughter shared that she has spoken to a [place] [employer] farm manager attorney and was told that patients income and life insurance policy disqualified her from [place] in any setting. Daughter states that they are no longer to provide care for patient in the home with assistance/support and are looking int SNF placement for patient. Daughter is aware that patient would be private pay in this setting.   SW provided daughter with a list of SNF facilities in [place] for family to consider and look into:  [facility] [phone] Fax: [phone] [place], [place] [address]  [facility] [place]. [phone] Fax: [phone] [place], [place] [address]  [facility] [place] [phone] Fax: [phone] [place], [place] [facility] [address] [phone] Fax: [phone] [place], [place] [address]  [facility] [address] [phone] Fax: [phone] [place], [place] [address]  [facility] [phone] Fax: [phone] [place], [place] [address]   Daughter will outreach with any additional questions/concerns.

## 2020-09-27 ENCOUNTER — Other Ambulatory Visit: Payer: Self-pay

## 2020-09-27 ENCOUNTER — Ambulatory Visit (INDEPENDENT_AMBULATORY_CARE_PROVIDER_SITE_OTHER): Payer: Medicare Other | Admitting: Podiatry

## 2020-09-27 ENCOUNTER — Encounter: Payer: Self-pay | Admitting: Podiatry

## 2020-09-27 DIAGNOSIS — M79674 Pain in right toe(s): Secondary | ICD-10-CM

## 2020-09-27 DIAGNOSIS — M79675 Pain in left toe(s): Secondary | ICD-10-CM

## 2020-09-27 DIAGNOSIS — B351 Tinea unguium: Secondary | ICD-10-CM

## 2020-09-30 NOTE — Progress Notes (Signed)
Subjective: [name] is a pleasant 87 y.o. female patient seen today with h/o blood clotting disorder and painful thick toenails that are difficult to trim. Pain interferes with ambulation. Aggravating factors include wearing enclosed shoe gear. Pain is relieved with periodic professional debridement.  PCP is [name], MD. Last visit was: 04/06/2020.  She notes no new pedal problems on today's visit.  Allergies  Allergen Reactions   Benadryl [Diphenhydramine Hcl] Hives and Shortness Of Breath   Eye Drops [Tetrahydrozoline Hcl] Other (See Comments)    Burning, itching, swelling   Influenza Virus Vaccine Shortness Of Breath   Iohexol Hives and Shortness Of Breath     Code: HIVES, Desc: PT IS ALLERGIC TO IVP DYE/IODINE/SHRIMP.  CAUSES SOB AND HIVES PER PT.  STEPHANIE, RT-R, Onset Date: [id number]    Shrimp [Shellfish Allergy] Anaphylaxis   Voltaren [Diclofenac Sodium] Other (See Comments)    Acid reflux symptoms   Ambien [Zolpidem Tartrate] Hives   Gluten Meal Diarrhea   Travatan Z [Travoprost] Other (See Comments)    BRAND NAME ONLY OLCANNOT TOLERATE GENERIC FORM OF EYE DROPS, IT CONTAINS PRESERVATIVES-SENSITIVITY   Ativan [Lorazepam]     delusional   Duloxetine Hcl     Other reaction(s): zombie   Furosemide     Other reaction(s): dizzy/lightheaded   Hydrocodone     Only on the higher dosages   Hydrocodone-Acetaminophen     Other reaction(s): only the 7.5/325mg  dose  out of mind   Ibandronic Acid     Other reaction(s): jerking   Other     Other reaction(s): Unknown Other reaction(s): rash Other reaction(s): reflux Other reaction(s): Unknown Other reaction(s): rash Other reaction(s): Unknown Other reaction(s): head felt full Other reaction(s): Unknown Other reaction(s): rash Other reaction(s): rash   Betadine Antibiotic-Moisturize [Bacitracin-Polymyxin B] Rash   Biaxin [Clarithromycin] Hives   Celebrex [Celecoxib] Rash   Ciprofloxacin Hcl Rash         Combigan [Brimonidine Tartrate-Timolol] Other (See Comments)    unknown   Coumadin [Warfarin Sodium] Rash   Ivp Dye [Iodinated Diagnostic Agents] Hives   Levaquin [Levofloxacin In D5w] Rash   Naprosyn [Naproxen] Rash   Pataday [Olopatadine Hcl] Other (See Comments)   Penicillins Hives    Has patient had a PCN reaction causing immediate rash, facial/tongue/throat swelling, SOB or lightheadedness with hypotension: Yes Has patient had a PCN reaction causing severe rash involving mucus membranes or skin necrosis: No Has patient had a PCN reaction that required hospitalization No Has patient had a PCN reaction occurring within the last 10 years: No If all of the above answers are "NO", then may proceed with Cephalosporin use.    Plavix [Clopidogrel Bisulfate] Rash   Septra [Sulfamethoxazole-Trimethoprim] Hives    Objective: Physical Exam  General: [name] is a pleasant 87 y.o. African American female, in NAD. AAO x 3.   Vascular:  Capillary fill time to digits <3 seconds b/l lower extremities. Palpable DP pulse(s) b/l lower extremities Palpable PT pulse(s) b/l lower extremities Pedal hair absent. Lower extremity skin temperature gradient within normal limits. Trace edema noted b/l lower extremities.  Dermatological:  Pedal skin is thin shiny, atrophic b/l lower extremities. No open wounds b/l lower extremities No interdigital macerations b/l lower extremities Toenails 1-5 b/l elongated, discolored, dystrophic, thickened, crumbly with subungual debris and tenderness to dorsal palpation.  Musculoskeletal:  Normal muscle strength 5/5 to all lower extremity muscle groups bilaterally. No pain crepitus or joint limitation noted with ROM b/l. Hammertoe(s) noted to the  2-5 bilaterally. Utilizes wheelchair for mobility assistance.  Neurological:  Protective sensation intact 5/5 intact bilaterally with 10g monofilament b/l. Vibratory sensation intact b/l.  Assessment and Plan:  1. Pain  due to onychomycosis of toenails of both feet     -Examined patient. -Patient to continue soft, supportive shoe gear daily. -Toenails 1-5 b/l were debrided in length and girth with sterile nail nippers and dremel without iatrogenic bleeding.  -Patient to report any pedal injuries to medical professional immediately. -Patient/POA to call should there be question/concern in the interim.  Return in about 3 months (around 12/28/2020).  Marzetta Board, DPM

## 2020-10-05 DIAGNOSIS — I129 Hypertensive chronic kidney disease with stage 1 through stage 4 chronic kidney disease, or unspecified chronic kidney disease: Secondary | ICD-10-CM | POA: Diagnosis not present

## 2020-10-05 DIAGNOSIS — N183 Chronic kidney disease, stage 3 unspecified: Secondary | ICD-10-CM | POA: Diagnosis not present

## 2020-10-05 DIAGNOSIS — R300 Dysuria: Secondary | ICD-10-CM | POA: Diagnosis not present

## 2020-10-05 DIAGNOSIS — F0390 Unspecified dementia without behavioral disturbance: Secondary | ICD-10-CM | POA: Diagnosis not present

## 2020-10-05 DIAGNOSIS — M25561 Pain in right knee: Secondary | ICD-10-CM | POA: Diagnosis not present

## 2020-10-05 DIAGNOSIS — M79672 Pain in left foot: Secondary | ICD-10-CM | POA: Diagnosis not present

## 2020-10-09 DIAGNOSIS — R300 Dysuria: Secondary | ICD-10-CM | POA: Diagnosis not present

## 2020-11-14 ENCOUNTER — Other Ambulatory Visit: Payer: Self-pay | Admitting: Neurology

## 2020-11-24 DIAGNOSIS — N183 Chronic kidney disease, stage 3 unspecified: Secondary | ICD-10-CM | POA: Diagnosis not present

## 2020-11-24 DIAGNOSIS — I82409 Acute embolism and thrombosis of unspecified deep veins of unspecified lower extremity: Secondary | ICD-10-CM | POA: Diagnosis not present

## 2020-11-24 DIAGNOSIS — G309 Alzheimer's disease, unspecified: Secondary | ICD-10-CM | POA: Diagnosis not present

## 2020-11-24 DIAGNOSIS — I10 Essential (primary) hypertension: Secondary | ICD-10-CM | POA: Diagnosis not present

## 2020-11-28 DIAGNOSIS — H409 Unspecified glaucoma: Secondary | ICD-10-CM | POA: Diagnosis not present

## 2020-11-28 DIAGNOSIS — F0390 Unspecified dementia without behavioral disturbance: Secondary | ICD-10-CM | POA: Diagnosis not present

## 2020-11-28 DIAGNOSIS — I10 Essential (primary) hypertension: Secondary | ICD-10-CM | POA: Diagnosis not present

## 2020-11-28 DIAGNOSIS — F32A Depression, unspecified: Secondary | ICD-10-CM | POA: Diagnosis not present

## 2020-11-28 DIAGNOSIS — G311 Senile degeneration of brain, not elsewhere classified: Secondary | ICD-10-CM | POA: Diagnosis not present

## 2020-11-28 DIAGNOSIS — I480 Paroxysmal atrial fibrillation: Secondary | ICD-10-CM | POA: Diagnosis not present

## 2020-11-28 DIAGNOSIS — M1990 Unspecified osteoarthritis, unspecified site: Secondary | ICD-10-CM | POA: Diagnosis not present

## 2020-11-28 DIAGNOSIS — D6869 Other thrombophilia: Secondary | ICD-10-CM | POA: Diagnosis not present

## 2020-11-28 DIAGNOSIS — E039 Hypothyroidism, unspecified: Secondary | ICD-10-CM | POA: Diagnosis not present

## 2020-11-28 DIAGNOSIS — K219 Gastro-esophageal reflux disease without esophagitis: Secondary | ICD-10-CM | POA: Diagnosis not present

## 2020-11-28 DIAGNOSIS — E559 Vitamin D deficiency, unspecified: Secondary | ICD-10-CM | POA: Diagnosis not present

## 2020-11-28 DIAGNOSIS — I129 Hypertensive chronic kidney disease with stage 1 through stage 4 chronic kidney disease, or unspecified chronic kidney disease: Secondary | ICD-10-CM | POA: Diagnosis not present

## 2020-11-28 DIAGNOSIS — D638 Anemia in other chronic diseases classified elsewhere: Secondary | ICD-10-CM | POA: Diagnosis not present

## 2020-11-29 DIAGNOSIS — G311 Senile degeneration of brain, not elsewhere classified: Secondary | ICD-10-CM | POA: Diagnosis not present

## 2020-11-29 DIAGNOSIS — E039 Hypothyroidism, unspecified: Secondary | ICD-10-CM | POA: Diagnosis not present

## 2020-11-29 DIAGNOSIS — F0390 Unspecified dementia without behavioral disturbance: Secondary | ICD-10-CM | POA: Diagnosis not present

## 2020-11-29 DIAGNOSIS — D638 Anemia in other chronic diseases classified elsewhere: Secondary | ICD-10-CM | POA: Diagnosis not present

## 2020-11-29 DIAGNOSIS — E559 Vitamin D deficiency, unspecified: Secondary | ICD-10-CM | POA: Diagnosis not present

## 2020-11-29 DIAGNOSIS — D6869 Other thrombophilia: Secondary | ICD-10-CM | POA: Diagnosis not present

## 2020-11-30 DIAGNOSIS — D6869 Other thrombophilia: Secondary | ICD-10-CM | POA: Diagnosis not present

## 2020-11-30 DIAGNOSIS — F0390 Unspecified dementia without behavioral disturbance: Secondary | ICD-10-CM | POA: Diagnosis not present

## 2020-11-30 DIAGNOSIS — G311 Senile degeneration of brain, not elsewhere classified: Secondary | ICD-10-CM | POA: Diagnosis not present

## 2020-11-30 DIAGNOSIS — E559 Vitamin D deficiency, unspecified: Secondary | ICD-10-CM | POA: Diagnosis not present

## 2020-11-30 DIAGNOSIS — D638 Anemia in other chronic diseases classified elsewhere: Secondary | ICD-10-CM | POA: Diagnosis not present

## 2020-11-30 DIAGNOSIS — E039 Hypothyroidism, unspecified: Secondary | ICD-10-CM | POA: Diagnosis not present

## 2020-12-01 DIAGNOSIS — D638 Anemia in other chronic diseases classified elsewhere: Secondary | ICD-10-CM | POA: Diagnosis not present

## 2020-12-01 DIAGNOSIS — F0390 Unspecified dementia without behavioral disturbance: Secondary | ICD-10-CM | POA: Diagnosis not present

## 2020-12-01 DIAGNOSIS — U071 COVID-19: Secondary | ICD-10-CM | POA: Diagnosis not present

## 2020-12-01 DIAGNOSIS — E039 Hypothyroidism, unspecified: Secondary | ICD-10-CM | POA: Diagnosis not present

## 2020-12-01 DIAGNOSIS — Z1331 Encounter for screening for depression: Secondary | ICD-10-CM | POA: Diagnosis not present

## 2020-12-01 DIAGNOSIS — G311 Senile degeneration of brain, not elsewhere classified: Secondary | ICD-10-CM | POA: Diagnosis not present

## 2020-12-01 DIAGNOSIS — D6869 Other thrombophilia: Secondary | ICD-10-CM | POA: Diagnosis not present

## 2020-12-01 DIAGNOSIS — E559 Vitamin D deficiency, unspecified: Secondary | ICD-10-CM | POA: Diagnosis not present

## 2020-12-04 DIAGNOSIS — E559 Vitamin D deficiency, unspecified: Secondary | ICD-10-CM | POA: Diagnosis not present

## 2020-12-04 DIAGNOSIS — D638 Anemia in other chronic diseases classified elsewhere: Secondary | ICD-10-CM | POA: Diagnosis not present

## 2020-12-04 DIAGNOSIS — D6869 Other thrombophilia: Secondary | ICD-10-CM | POA: Diagnosis not present

## 2020-12-04 DIAGNOSIS — E039 Hypothyroidism, unspecified: Secondary | ICD-10-CM | POA: Diagnosis not present

## 2020-12-04 DIAGNOSIS — F0390 Unspecified dementia without behavioral disturbance: Secondary | ICD-10-CM | POA: Diagnosis not present

## 2020-12-04 DIAGNOSIS — G311 Senile degeneration of brain, not elsewhere classified: Secondary | ICD-10-CM | POA: Diagnosis not present

## 2020-12-06 DIAGNOSIS — G311 Senile degeneration of brain, not elsewhere classified: Secondary | ICD-10-CM | POA: Diagnosis not present

## 2020-12-06 DIAGNOSIS — D638 Anemia in other chronic diseases classified elsewhere: Secondary | ICD-10-CM | POA: Diagnosis not present

## 2020-12-06 DIAGNOSIS — D6869 Other thrombophilia: Secondary | ICD-10-CM | POA: Diagnosis not present

## 2020-12-06 DIAGNOSIS — E039 Hypothyroidism, unspecified: Secondary | ICD-10-CM | POA: Diagnosis not present

## 2020-12-06 DIAGNOSIS — E559 Vitamin D deficiency, unspecified: Secondary | ICD-10-CM | POA: Diagnosis not present

## 2020-12-06 DIAGNOSIS — F0390 Unspecified dementia without behavioral disturbance: Secondary | ICD-10-CM | POA: Diagnosis not present

## 2020-12-07 ENCOUNTER — Encounter: Payer: Self-pay | Admitting: Neurology

## 2020-12-08 DIAGNOSIS — E559 Vitamin D deficiency, unspecified: Secondary | ICD-10-CM | POA: Diagnosis not present

## 2020-12-08 DIAGNOSIS — D638 Anemia in other chronic diseases classified elsewhere: Secondary | ICD-10-CM | POA: Diagnosis not present

## 2020-12-08 DIAGNOSIS — F0390 Unspecified dementia without behavioral disturbance: Secondary | ICD-10-CM | POA: Diagnosis not present

## 2020-12-08 DIAGNOSIS — G311 Senile degeneration of brain, not elsewhere classified: Secondary | ICD-10-CM | POA: Diagnosis not present

## 2020-12-08 DIAGNOSIS — E039 Hypothyroidism, unspecified: Secondary | ICD-10-CM | POA: Diagnosis not present

## 2020-12-08 DIAGNOSIS — D6869 Other thrombophilia: Secondary | ICD-10-CM | POA: Diagnosis not present

## 2020-12-11 DIAGNOSIS — G311 Senile degeneration of brain, not elsewhere classified: Secondary | ICD-10-CM | POA: Diagnosis not present

## 2020-12-11 DIAGNOSIS — D638 Anemia in other chronic diseases classified elsewhere: Secondary | ICD-10-CM | POA: Diagnosis not present

## 2020-12-11 DIAGNOSIS — D6869 Other thrombophilia: Secondary | ICD-10-CM | POA: Diagnosis not present

## 2020-12-11 DIAGNOSIS — E559 Vitamin D deficiency, unspecified: Secondary | ICD-10-CM | POA: Diagnosis not present

## 2020-12-11 DIAGNOSIS — E039 Hypothyroidism, unspecified: Secondary | ICD-10-CM | POA: Diagnosis not present

## 2020-12-11 DIAGNOSIS — F0390 Unspecified dementia without behavioral disturbance: Secondary | ICD-10-CM | POA: Diagnosis not present

## 2020-12-12 DIAGNOSIS — D6869 Other thrombophilia: Secondary | ICD-10-CM | POA: Diagnosis not present

## 2020-12-12 DIAGNOSIS — D638 Anemia in other chronic diseases classified elsewhere: Secondary | ICD-10-CM | POA: Diagnosis not present

## 2020-12-12 DIAGNOSIS — E039 Hypothyroidism, unspecified: Secondary | ICD-10-CM | POA: Diagnosis not present

## 2020-12-12 DIAGNOSIS — G311 Senile degeneration of brain, not elsewhere classified: Secondary | ICD-10-CM | POA: Diagnosis not present

## 2020-12-12 DIAGNOSIS — E559 Vitamin D deficiency, unspecified: Secondary | ICD-10-CM | POA: Diagnosis not present

## 2020-12-12 DIAGNOSIS — F0390 Unspecified dementia without behavioral disturbance: Secondary | ICD-10-CM | POA: Diagnosis not present

## 2020-12-13 DIAGNOSIS — E559 Vitamin D deficiency, unspecified: Secondary | ICD-10-CM | POA: Diagnosis not present

## 2020-12-13 DIAGNOSIS — E039 Hypothyroidism, unspecified: Secondary | ICD-10-CM | POA: Diagnosis not present

## 2020-12-13 DIAGNOSIS — D638 Anemia in other chronic diseases classified elsewhere: Secondary | ICD-10-CM | POA: Diagnosis not present

## 2020-12-13 DIAGNOSIS — D6869 Other thrombophilia: Secondary | ICD-10-CM | POA: Diagnosis not present

## 2020-12-13 DIAGNOSIS — G311 Senile degeneration of brain, not elsewhere classified: Secondary | ICD-10-CM | POA: Diagnosis not present

## 2020-12-13 DIAGNOSIS — F0390 Unspecified dementia without behavioral disturbance: Secondary | ICD-10-CM | POA: Diagnosis not present

## 2020-12-15 DIAGNOSIS — E039 Hypothyroidism, unspecified: Secondary | ICD-10-CM | POA: Diagnosis not present

## 2020-12-15 DIAGNOSIS — F0390 Unspecified dementia without behavioral disturbance: Secondary | ICD-10-CM | POA: Diagnosis not present

## 2020-12-15 DIAGNOSIS — E559 Vitamin D deficiency, unspecified: Secondary | ICD-10-CM | POA: Diagnosis not present

## 2020-12-15 DIAGNOSIS — G311 Senile degeneration of brain, not elsewhere classified: Secondary | ICD-10-CM | POA: Diagnosis not present

## 2020-12-15 DIAGNOSIS — D6869 Other thrombophilia: Secondary | ICD-10-CM | POA: Diagnosis not present

## 2020-12-15 DIAGNOSIS — D638 Anemia in other chronic diseases classified elsewhere: Secondary | ICD-10-CM | POA: Diagnosis not present

## 2020-12-18 DIAGNOSIS — D6869 Other thrombophilia: Secondary | ICD-10-CM | POA: Diagnosis not present

## 2020-12-18 DIAGNOSIS — G311 Senile degeneration of brain, not elsewhere classified: Secondary | ICD-10-CM | POA: Diagnosis not present

## 2020-12-18 DIAGNOSIS — D638 Anemia in other chronic diseases classified elsewhere: Secondary | ICD-10-CM | POA: Diagnosis not present

## 2020-12-18 DIAGNOSIS — E039 Hypothyroidism, unspecified: Secondary | ICD-10-CM | POA: Diagnosis not present

## 2020-12-18 DIAGNOSIS — F0390 Unspecified dementia without behavioral disturbance: Secondary | ICD-10-CM | POA: Diagnosis not present

## 2020-12-18 DIAGNOSIS — E559 Vitamin D deficiency, unspecified: Secondary | ICD-10-CM | POA: Diagnosis not present

## 2020-12-19 DIAGNOSIS — D6869 Other thrombophilia: Secondary | ICD-10-CM | POA: Diagnosis not present

## 2020-12-19 DIAGNOSIS — F0390 Unspecified dementia without behavioral disturbance: Secondary | ICD-10-CM | POA: Diagnosis not present

## 2020-12-19 DIAGNOSIS — E559 Vitamin D deficiency, unspecified: Secondary | ICD-10-CM | POA: Diagnosis not present

## 2020-12-19 DIAGNOSIS — D638 Anemia in other chronic diseases classified elsewhere: Secondary | ICD-10-CM | POA: Diagnosis not present

## 2020-12-19 DIAGNOSIS — E039 Hypothyroidism, unspecified: Secondary | ICD-10-CM | POA: Diagnosis not present

## 2020-12-19 DIAGNOSIS — G311 Senile degeneration of brain, not elsewhere classified: Secondary | ICD-10-CM | POA: Diagnosis not present

## 2020-12-20 DIAGNOSIS — D638 Anemia in other chronic diseases classified elsewhere: Secondary | ICD-10-CM | POA: Diagnosis not present

## 2020-12-20 DIAGNOSIS — F0390 Unspecified dementia without behavioral disturbance: Secondary | ICD-10-CM | POA: Diagnosis not present

## 2020-12-20 DIAGNOSIS — E559 Vitamin D deficiency, unspecified: Secondary | ICD-10-CM | POA: Diagnosis not present

## 2020-12-20 DIAGNOSIS — G311 Senile degeneration of brain, not elsewhere classified: Secondary | ICD-10-CM | POA: Diagnosis not present

## 2020-12-20 DIAGNOSIS — E039 Hypothyroidism, unspecified: Secondary | ICD-10-CM | POA: Diagnosis not present

## 2020-12-20 DIAGNOSIS — D6869 Other thrombophilia: Secondary | ICD-10-CM | POA: Diagnosis not present

## 2020-12-21 DIAGNOSIS — D6869 Other thrombophilia: Secondary | ICD-10-CM | POA: Diagnosis not present

## 2020-12-21 DIAGNOSIS — G311 Senile degeneration of brain, not elsewhere classified: Secondary | ICD-10-CM | POA: Diagnosis not present

## 2020-12-21 DIAGNOSIS — F0390 Unspecified dementia without behavioral disturbance: Secondary | ICD-10-CM | POA: Diagnosis not present

## 2020-12-21 DIAGNOSIS — E559 Vitamin D deficiency, unspecified: Secondary | ICD-10-CM | POA: Diagnosis not present

## 2020-12-21 DIAGNOSIS — D638 Anemia in other chronic diseases classified elsewhere: Secondary | ICD-10-CM | POA: Diagnosis not present

## 2020-12-21 DIAGNOSIS — E039 Hypothyroidism, unspecified: Secondary | ICD-10-CM | POA: Diagnosis not present

## 2020-12-22 DIAGNOSIS — D6869 Other thrombophilia: Secondary | ICD-10-CM | POA: Diagnosis not present

## 2020-12-22 DIAGNOSIS — F0390 Unspecified dementia without behavioral disturbance: Secondary | ICD-10-CM | POA: Diagnosis not present

## 2020-12-22 DIAGNOSIS — G311 Senile degeneration of brain, not elsewhere classified: Secondary | ICD-10-CM | POA: Diagnosis not present

## 2020-12-22 DIAGNOSIS — E559 Vitamin D deficiency, unspecified: Secondary | ICD-10-CM | POA: Diagnosis not present

## 2020-12-22 DIAGNOSIS — D638 Anemia in other chronic diseases classified elsewhere: Secondary | ICD-10-CM | POA: Diagnosis not present

## 2020-12-22 DIAGNOSIS — E039 Hypothyroidism, unspecified: Secondary | ICD-10-CM | POA: Diagnosis not present

## 2020-12-23 DIAGNOSIS — F0390 Unspecified dementia without behavioral disturbance: Secondary | ICD-10-CM | POA: Diagnosis not present

## 2020-12-23 DIAGNOSIS — D6869 Other thrombophilia: Secondary | ICD-10-CM | POA: Diagnosis not present

## 2020-12-23 DIAGNOSIS — I480 Paroxysmal atrial fibrillation: Secondary | ICD-10-CM | POA: Diagnosis not present

## 2020-12-23 DIAGNOSIS — E039 Hypothyroidism, unspecified: Secondary | ICD-10-CM | POA: Diagnosis not present

## 2020-12-23 DIAGNOSIS — E559 Vitamin D deficiency, unspecified: Secondary | ICD-10-CM | POA: Diagnosis not present

## 2020-12-23 DIAGNOSIS — H409 Unspecified glaucoma: Secondary | ICD-10-CM | POA: Diagnosis not present

## 2020-12-23 DIAGNOSIS — F32A Depression, unspecified: Secondary | ICD-10-CM | POA: Diagnosis not present

## 2020-12-23 DIAGNOSIS — G311 Senile degeneration of brain, not elsewhere classified: Secondary | ICD-10-CM | POA: Diagnosis not present

## 2020-12-23 DIAGNOSIS — D638 Anemia in other chronic diseases classified elsewhere: Secondary | ICD-10-CM | POA: Diagnosis not present

## 2020-12-23 DIAGNOSIS — M1990 Unspecified osteoarthritis, unspecified site: Secondary | ICD-10-CM | POA: Diagnosis not present

## 2020-12-23 DIAGNOSIS — I129 Hypertensive chronic kidney disease with stage 1 through stage 4 chronic kidney disease, or unspecified chronic kidney disease: Secondary | ICD-10-CM | POA: Diagnosis not present

## 2020-12-23 DIAGNOSIS — I10 Essential (primary) hypertension: Secondary | ICD-10-CM | POA: Diagnosis not present

## 2020-12-23 DIAGNOSIS — K219 Gastro-esophageal reflux disease without esophagitis: Secondary | ICD-10-CM | POA: Diagnosis not present

## 2020-12-25 DIAGNOSIS — D6869 Other thrombophilia: Secondary | ICD-10-CM | POA: Diagnosis not present

## 2020-12-25 DIAGNOSIS — E039 Hypothyroidism, unspecified: Secondary | ICD-10-CM | POA: Diagnosis not present

## 2020-12-25 DIAGNOSIS — G311 Senile degeneration of brain, not elsewhere classified: Secondary | ICD-10-CM | POA: Diagnosis not present

## 2020-12-25 DIAGNOSIS — E559 Vitamin D deficiency, unspecified: Secondary | ICD-10-CM | POA: Diagnosis not present

## 2020-12-25 DIAGNOSIS — D638 Anemia in other chronic diseases classified elsewhere: Secondary | ICD-10-CM | POA: Diagnosis not present

## 2020-12-25 DIAGNOSIS — F0390 Unspecified dementia without behavioral disturbance: Secondary | ICD-10-CM | POA: Diagnosis not present

## 2020-12-26 DIAGNOSIS — D638 Anemia in other chronic diseases classified elsewhere: Secondary | ICD-10-CM | POA: Diagnosis not present

## 2020-12-26 DIAGNOSIS — E039 Hypothyroidism, unspecified: Secondary | ICD-10-CM | POA: Diagnosis not present

## 2020-12-26 DIAGNOSIS — G311 Senile degeneration of brain, not elsewhere classified: Secondary | ICD-10-CM | POA: Diagnosis not present

## 2020-12-26 DIAGNOSIS — D6869 Other thrombophilia: Secondary | ICD-10-CM | POA: Diagnosis not present

## 2020-12-26 DIAGNOSIS — E559 Vitamin D deficiency, unspecified: Secondary | ICD-10-CM | POA: Diagnosis not present

## 2020-12-26 DIAGNOSIS — F0390 Unspecified dementia without behavioral disturbance: Secondary | ICD-10-CM | POA: Diagnosis not present

## 2020-12-27 DIAGNOSIS — G311 Senile degeneration of brain, not elsewhere classified: Secondary | ICD-10-CM | POA: Diagnosis not present

## 2020-12-27 DIAGNOSIS — D638 Anemia in other chronic diseases classified elsewhere: Secondary | ICD-10-CM | POA: Diagnosis not present

## 2020-12-27 DIAGNOSIS — E039 Hypothyroidism, unspecified: Secondary | ICD-10-CM | POA: Diagnosis not present

## 2020-12-27 DIAGNOSIS — E559 Vitamin D deficiency, unspecified: Secondary | ICD-10-CM | POA: Diagnosis not present

## 2020-12-27 DIAGNOSIS — D6869 Other thrombophilia: Secondary | ICD-10-CM | POA: Diagnosis not present

## 2020-12-27 DIAGNOSIS — F0390 Unspecified dementia without behavioral disturbance: Secondary | ICD-10-CM | POA: Diagnosis not present

## 2020-12-29 DIAGNOSIS — E039 Hypothyroidism, unspecified: Secondary | ICD-10-CM | POA: Diagnosis not present

## 2020-12-29 DIAGNOSIS — E559 Vitamin D deficiency, unspecified: Secondary | ICD-10-CM | POA: Diagnosis not present

## 2020-12-29 DIAGNOSIS — F0390 Unspecified dementia without behavioral disturbance: Secondary | ICD-10-CM | POA: Diagnosis not present

## 2020-12-29 DIAGNOSIS — D638 Anemia in other chronic diseases classified elsewhere: Secondary | ICD-10-CM | POA: Diagnosis not present

## 2020-12-29 DIAGNOSIS — D6869 Other thrombophilia: Secondary | ICD-10-CM | POA: Diagnosis not present

## 2020-12-29 DIAGNOSIS — G311 Senile degeneration of brain, not elsewhere classified: Secondary | ICD-10-CM | POA: Diagnosis not present

## 2021-01-01 DIAGNOSIS — G311 Senile degeneration of brain, not elsewhere classified: Secondary | ICD-10-CM | POA: Diagnosis not present

## 2021-01-01 DIAGNOSIS — F0390 Unspecified dementia without behavioral disturbance: Secondary | ICD-10-CM | POA: Diagnosis not present

## 2021-01-01 DIAGNOSIS — E559 Vitamin D deficiency, unspecified: Secondary | ICD-10-CM | POA: Diagnosis not present

## 2021-01-01 DIAGNOSIS — D638 Anemia in other chronic diseases classified elsewhere: Secondary | ICD-10-CM | POA: Diagnosis not present

## 2021-01-01 DIAGNOSIS — E039 Hypothyroidism, unspecified: Secondary | ICD-10-CM | POA: Diagnosis not present

## 2021-01-01 DIAGNOSIS — D6869 Other thrombophilia: Secondary | ICD-10-CM | POA: Diagnosis not present

## 2021-01-02 ENCOUNTER — Ambulatory Visit: Payer: Medicare Other | Admitting: Podiatry

## 2021-01-02 DIAGNOSIS — D6869 Other thrombophilia: Secondary | ICD-10-CM | POA: Diagnosis not present

## 2021-01-02 DIAGNOSIS — E039 Hypothyroidism, unspecified: Secondary | ICD-10-CM | POA: Diagnosis not present

## 2021-01-02 DIAGNOSIS — F0390 Unspecified dementia without behavioral disturbance: Secondary | ICD-10-CM | POA: Diagnosis not present

## 2021-01-02 DIAGNOSIS — E559 Vitamin D deficiency, unspecified: Secondary | ICD-10-CM | POA: Diagnosis not present

## 2021-01-02 DIAGNOSIS — G311 Senile degeneration of brain, not elsewhere classified: Secondary | ICD-10-CM | POA: Diagnosis not present

## 2021-01-02 DIAGNOSIS — D638 Anemia in other chronic diseases classified elsewhere: Secondary | ICD-10-CM | POA: Diagnosis not present

## 2021-01-03 DIAGNOSIS — D638 Anemia in other chronic diseases classified elsewhere: Secondary | ICD-10-CM | POA: Diagnosis not present

## 2021-01-03 DIAGNOSIS — N3281 Overactive bladder: Secondary | ICD-10-CM | POA: Diagnosis not present

## 2021-01-03 DIAGNOSIS — E785 Hyperlipidemia, unspecified: Secondary | ICD-10-CM | POA: Diagnosis not present

## 2021-01-03 DIAGNOSIS — E039 Hypothyroidism, unspecified: Secondary | ICD-10-CM | POA: Diagnosis not present

## 2021-01-03 DIAGNOSIS — H409 Unspecified glaucoma: Secondary | ICD-10-CM | POA: Diagnosis not present

## 2021-01-03 DIAGNOSIS — F0390 Unspecified dementia without behavioral disturbance: Secondary | ICD-10-CM | POA: Diagnosis not present

## 2021-01-03 DIAGNOSIS — G500 Trigeminal neuralgia: Secondary | ICD-10-CM | POA: Diagnosis not present

## 2021-01-03 DIAGNOSIS — I10 Essential (primary) hypertension: Secondary | ICD-10-CM | POA: Diagnosis not present

## 2021-01-03 DIAGNOSIS — G311 Senile degeneration of brain, not elsewhere classified: Secondary | ICD-10-CM | POA: Diagnosis not present

## 2021-01-03 DIAGNOSIS — I82409 Acute embolism and thrombosis of unspecified deep veins of unspecified lower extremity: Secondary | ICD-10-CM | POA: Diagnosis not present

## 2021-01-03 DIAGNOSIS — G309 Alzheimer's disease, unspecified: Secondary | ICD-10-CM | POA: Diagnosis not present

## 2021-01-03 DIAGNOSIS — N183 Chronic kidney disease, stage 3 unspecified: Secondary | ICD-10-CM | POA: Diagnosis not present

## 2021-01-03 DIAGNOSIS — E559 Vitamin D deficiency, unspecified: Secondary | ICD-10-CM | POA: Diagnosis not present

## 2021-01-03 DIAGNOSIS — U071 COVID-19: Secondary | ICD-10-CM | POA: Diagnosis not present

## 2021-01-03 DIAGNOSIS — D6869 Other thrombophilia: Secondary | ICD-10-CM | POA: Diagnosis not present

## 2021-01-05 DIAGNOSIS — E559 Vitamin D deficiency, unspecified: Secondary | ICD-10-CM | POA: Diagnosis not present

## 2021-01-05 DIAGNOSIS — E039 Hypothyroidism, unspecified: Secondary | ICD-10-CM | POA: Diagnosis not present

## 2021-01-05 DIAGNOSIS — D638 Anemia in other chronic diseases classified elsewhere: Secondary | ICD-10-CM | POA: Diagnosis not present

## 2021-01-05 DIAGNOSIS — G311 Senile degeneration of brain, not elsewhere classified: Secondary | ICD-10-CM | POA: Diagnosis not present

## 2021-01-05 DIAGNOSIS — D6869 Other thrombophilia: Secondary | ICD-10-CM | POA: Diagnosis not present

## 2021-01-05 DIAGNOSIS — F0390 Unspecified dementia without behavioral disturbance: Secondary | ICD-10-CM | POA: Diagnosis not present

## 2021-01-08 DIAGNOSIS — D638 Anemia in other chronic diseases classified elsewhere: Secondary | ICD-10-CM | POA: Diagnosis not present

## 2021-01-08 DIAGNOSIS — F0390 Unspecified dementia without behavioral disturbance: Secondary | ICD-10-CM | POA: Diagnosis not present

## 2021-01-08 DIAGNOSIS — E559 Vitamin D deficiency, unspecified: Secondary | ICD-10-CM | POA: Diagnosis not present

## 2021-01-08 DIAGNOSIS — G311 Senile degeneration of brain, not elsewhere classified: Secondary | ICD-10-CM | POA: Diagnosis not present

## 2021-01-08 DIAGNOSIS — D6869 Other thrombophilia: Secondary | ICD-10-CM | POA: Diagnosis not present

## 2021-01-08 DIAGNOSIS — E039 Hypothyroidism, unspecified: Secondary | ICD-10-CM | POA: Diagnosis not present

## 2021-01-09 DIAGNOSIS — E039 Hypothyroidism, unspecified: Secondary | ICD-10-CM | POA: Diagnosis not present

## 2021-01-09 DIAGNOSIS — F0390 Unspecified dementia without behavioral disturbance: Secondary | ICD-10-CM | POA: Diagnosis not present

## 2021-01-09 DIAGNOSIS — D6869 Other thrombophilia: Secondary | ICD-10-CM | POA: Diagnosis not present

## 2021-01-09 DIAGNOSIS — G311 Senile degeneration of brain, not elsewhere classified: Secondary | ICD-10-CM | POA: Diagnosis not present

## 2021-01-09 DIAGNOSIS — D638 Anemia in other chronic diseases classified elsewhere: Secondary | ICD-10-CM | POA: Diagnosis not present

## 2021-01-09 DIAGNOSIS — E559 Vitamin D deficiency, unspecified: Secondary | ICD-10-CM | POA: Diagnosis not present

## 2021-01-10 DIAGNOSIS — E559 Vitamin D deficiency, unspecified: Secondary | ICD-10-CM | POA: Diagnosis not present

## 2021-01-10 DIAGNOSIS — F0390 Unspecified dementia without behavioral disturbance: Secondary | ICD-10-CM | POA: Diagnosis not present

## 2021-01-10 DIAGNOSIS — G311 Senile degeneration of brain, not elsewhere classified: Secondary | ICD-10-CM | POA: Diagnosis not present

## 2021-01-10 DIAGNOSIS — D638 Anemia in other chronic diseases classified elsewhere: Secondary | ICD-10-CM | POA: Diagnosis not present

## 2021-01-10 DIAGNOSIS — E039 Hypothyroidism, unspecified: Secondary | ICD-10-CM | POA: Diagnosis not present

## 2021-01-10 DIAGNOSIS — D6869 Other thrombophilia: Secondary | ICD-10-CM | POA: Diagnosis not present

## 2021-01-12 DIAGNOSIS — G311 Senile degeneration of brain, not elsewhere classified: Secondary | ICD-10-CM | POA: Diagnosis not present

## 2021-01-12 DIAGNOSIS — E039 Hypothyroidism, unspecified: Secondary | ICD-10-CM | POA: Diagnosis not present

## 2021-01-12 DIAGNOSIS — D638 Anemia in other chronic diseases classified elsewhere: Secondary | ICD-10-CM | POA: Diagnosis not present

## 2021-01-12 DIAGNOSIS — D6869 Other thrombophilia: Secondary | ICD-10-CM | POA: Diagnosis not present

## 2021-01-12 DIAGNOSIS — F0390 Unspecified dementia without behavioral disturbance: Secondary | ICD-10-CM | POA: Diagnosis not present

## 2021-01-12 DIAGNOSIS — E559 Vitamin D deficiency, unspecified: Secondary | ICD-10-CM | POA: Diagnosis not present

## 2021-01-15 DIAGNOSIS — D6869 Other thrombophilia: Secondary | ICD-10-CM | POA: Diagnosis not present

## 2021-01-15 DIAGNOSIS — E559 Vitamin D deficiency, unspecified: Secondary | ICD-10-CM | POA: Diagnosis not present

## 2021-01-15 DIAGNOSIS — G311 Senile degeneration of brain, not elsewhere classified: Secondary | ICD-10-CM | POA: Diagnosis not present

## 2021-01-15 DIAGNOSIS — E039 Hypothyroidism, unspecified: Secondary | ICD-10-CM | POA: Diagnosis not present

## 2021-01-15 DIAGNOSIS — D638 Anemia in other chronic diseases classified elsewhere: Secondary | ICD-10-CM | POA: Diagnosis not present

## 2021-01-15 DIAGNOSIS — F0390 Unspecified dementia without behavioral disturbance: Secondary | ICD-10-CM | POA: Diagnosis not present

## 2021-01-16 DIAGNOSIS — D6869 Other thrombophilia: Secondary | ICD-10-CM | POA: Diagnosis not present

## 2021-01-16 DIAGNOSIS — F0390 Unspecified dementia without behavioral disturbance: Secondary | ICD-10-CM | POA: Diagnosis not present

## 2021-01-16 DIAGNOSIS — E559 Vitamin D deficiency, unspecified: Secondary | ICD-10-CM | POA: Diagnosis not present

## 2021-01-16 DIAGNOSIS — G311 Senile degeneration of brain, not elsewhere classified: Secondary | ICD-10-CM | POA: Diagnosis not present

## 2021-01-16 DIAGNOSIS — E039 Hypothyroidism, unspecified: Secondary | ICD-10-CM | POA: Diagnosis not present

## 2021-01-16 DIAGNOSIS — D638 Anemia in other chronic diseases classified elsewhere: Secondary | ICD-10-CM | POA: Diagnosis not present

## 2021-01-17 DIAGNOSIS — G311 Senile degeneration of brain, not elsewhere classified: Secondary | ICD-10-CM | POA: Diagnosis not present

## 2021-01-17 DIAGNOSIS — F0390 Unspecified dementia without behavioral disturbance: Secondary | ICD-10-CM | POA: Diagnosis not present

## 2021-01-17 DIAGNOSIS — L299 Pruritus, unspecified: Secondary | ICD-10-CM | POA: Diagnosis not present

## 2021-01-17 DIAGNOSIS — D638 Anemia in other chronic diseases classified elsewhere: Secondary | ICD-10-CM | POA: Diagnosis not present

## 2021-01-17 DIAGNOSIS — E559 Vitamin D deficiency, unspecified: Secondary | ICD-10-CM | POA: Diagnosis not present

## 2021-01-17 DIAGNOSIS — E039 Hypothyroidism, unspecified: Secondary | ICD-10-CM | POA: Diagnosis not present

## 2021-01-17 DIAGNOSIS — D6869 Other thrombophilia: Secondary | ICD-10-CM | POA: Diagnosis not present

## 2021-01-18 DIAGNOSIS — D638 Anemia in other chronic diseases classified elsewhere: Secondary | ICD-10-CM | POA: Diagnosis not present

## 2021-01-18 DIAGNOSIS — E039 Hypothyroidism, unspecified: Secondary | ICD-10-CM | POA: Diagnosis not present

## 2021-01-18 DIAGNOSIS — G311 Senile degeneration of brain, not elsewhere classified: Secondary | ICD-10-CM | POA: Diagnosis not present

## 2021-01-18 DIAGNOSIS — D6869 Other thrombophilia: Secondary | ICD-10-CM | POA: Diagnosis not present

## 2021-01-18 DIAGNOSIS — E559 Vitamin D deficiency, unspecified: Secondary | ICD-10-CM | POA: Diagnosis not present

## 2021-01-18 DIAGNOSIS — F0390 Unspecified dementia without behavioral disturbance: Secondary | ICD-10-CM | POA: Diagnosis not present

## 2021-01-19 DIAGNOSIS — D6869 Other thrombophilia: Secondary | ICD-10-CM | POA: Diagnosis not present

## 2021-01-19 DIAGNOSIS — G311 Senile degeneration of brain, not elsewhere classified: Secondary | ICD-10-CM | POA: Diagnosis not present

## 2021-01-19 DIAGNOSIS — E559 Vitamin D deficiency, unspecified: Secondary | ICD-10-CM | POA: Diagnosis not present

## 2021-01-19 DIAGNOSIS — F0390 Unspecified dementia without behavioral disturbance: Secondary | ICD-10-CM | POA: Diagnosis not present

## 2021-01-19 DIAGNOSIS — E039 Hypothyroidism, unspecified: Secondary | ICD-10-CM | POA: Diagnosis not present

## 2021-01-19 DIAGNOSIS — D638 Anemia in other chronic diseases classified elsewhere: Secondary | ICD-10-CM | POA: Diagnosis not present

## 2021-01-22 DIAGNOSIS — F0390 Unspecified dementia without behavioral disturbance: Secondary | ICD-10-CM | POA: Diagnosis not present

## 2021-01-22 DIAGNOSIS — G311 Senile degeneration of brain, not elsewhere classified: Secondary | ICD-10-CM | POA: Diagnosis not present

## 2021-01-22 DIAGNOSIS — D6869 Other thrombophilia: Secondary | ICD-10-CM | POA: Diagnosis not present

## 2021-01-22 DIAGNOSIS — E039 Hypothyroidism, unspecified: Secondary | ICD-10-CM | POA: Diagnosis not present

## 2021-01-22 DIAGNOSIS — D638 Anemia in other chronic diseases classified elsewhere: Secondary | ICD-10-CM | POA: Diagnosis not present

## 2021-01-22 DIAGNOSIS — E559 Vitamin D deficiency, unspecified: Secondary | ICD-10-CM | POA: Diagnosis not present

## 2021-01-23 DIAGNOSIS — E559 Vitamin D deficiency, unspecified: Secondary | ICD-10-CM | POA: Diagnosis not present

## 2021-01-23 DIAGNOSIS — K219 Gastro-esophageal reflux disease without esophagitis: Secondary | ICD-10-CM | POA: Diagnosis not present

## 2021-01-23 DIAGNOSIS — N183 Chronic kidney disease, stage 3 unspecified: Secondary | ICD-10-CM | POA: Diagnosis not present

## 2021-01-23 DIAGNOSIS — E785 Hyperlipidemia, unspecified: Secondary | ICD-10-CM | POA: Diagnosis not present

## 2021-01-23 DIAGNOSIS — F32A Depression, unspecified: Secondary | ICD-10-CM | POA: Diagnosis not present

## 2021-01-23 DIAGNOSIS — I129 Hypertensive chronic kidney disease with stage 1 through stage 4 chronic kidney disease, or unspecified chronic kidney disease: Secondary | ICD-10-CM | POA: Diagnosis not present

## 2021-01-23 DIAGNOSIS — D6869 Other thrombophilia: Secondary | ICD-10-CM | POA: Diagnosis not present

## 2021-01-23 DIAGNOSIS — G311 Senile degeneration of brain, not elsewhere classified: Secondary | ICD-10-CM | POA: Diagnosis not present

## 2021-01-23 DIAGNOSIS — Z8616 Personal history of COVID-19: Secondary | ICD-10-CM | POA: Diagnosis not present

## 2021-01-23 DIAGNOSIS — G309 Alzheimer's disease, unspecified: Secondary | ICD-10-CM | POA: Diagnosis not present

## 2021-01-23 DIAGNOSIS — D638 Anemia in other chronic diseases classified elsewhere: Secondary | ICD-10-CM | POA: Diagnosis not present

## 2021-01-23 DIAGNOSIS — G500 Trigeminal neuralgia: Secondary | ICD-10-CM | POA: Diagnosis not present

## 2021-01-23 DIAGNOSIS — N3281 Overactive bladder: Secondary | ICD-10-CM | POA: Diagnosis not present

## 2021-01-23 DIAGNOSIS — F0390 Unspecified dementia without behavioral disturbance: Secondary | ICD-10-CM | POA: Diagnosis not present

## 2021-01-23 DIAGNOSIS — I10 Essential (primary) hypertension: Secondary | ICD-10-CM | POA: Diagnosis not present

## 2021-01-23 DIAGNOSIS — M1990 Unspecified osteoarthritis, unspecified site: Secondary | ICD-10-CM | POA: Diagnosis not present

## 2021-01-23 DIAGNOSIS — I82409 Acute embolism and thrombosis of unspecified deep veins of unspecified lower extremity: Secondary | ICD-10-CM | POA: Diagnosis not present

## 2021-01-23 DIAGNOSIS — I480 Paroxysmal atrial fibrillation: Secondary | ICD-10-CM | POA: Diagnosis not present

## 2021-01-23 DIAGNOSIS — E039 Hypothyroidism, unspecified: Secondary | ICD-10-CM | POA: Diagnosis not present

## 2021-01-23 DIAGNOSIS — Z515 Encounter for palliative care: Secondary | ICD-10-CM | POA: Diagnosis not present

## 2021-01-23 DIAGNOSIS — H409 Unspecified glaucoma: Secondary | ICD-10-CM | POA: Diagnosis not present

## 2021-01-24 DIAGNOSIS — D6869 Other thrombophilia: Secondary | ICD-10-CM | POA: Diagnosis not present

## 2021-01-24 DIAGNOSIS — F0390 Unspecified dementia without behavioral disturbance: Secondary | ICD-10-CM | POA: Diagnosis not present

## 2021-01-24 DIAGNOSIS — E039 Hypothyroidism, unspecified: Secondary | ICD-10-CM | POA: Diagnosis not present

## 2021-01-24 DIAGNOSIS — G311 Senile degeneration of brain, not elsewhere classified: Secondary | ICD-10-CM | POA: Diagnosis not present

## 2021-01-24 DIAGNOSIS — E559 Vitamin D deficiency, unspecified: Secondary | ICD-10-CM | POA: Diagnosis not present

## 2021-01-24 DIAGNOSIS — D638 Anemia in other chronic diseases classified elsewhere: Secondary | ICD-10-CM | POA: Diagnosis not present

## 2021-01-25 DIAGNOSIS — G311 Senile degeneration of brain, not elsewhere classified: Secondary | ICD-10-CM | POA: Diagnosis not present

## 2021-01-25 DIAGNOSIS — E039 Hypothyroidism, unspecified: Secondary | ICD-10-CM | POA: Diagnosis not present

## 2021-01-25 DIAGNOSIS — F0390 Unspecified dementia without behavioral disturbance: Secondary | ICD-10-CM | POA: Diagnosis not present

## 2021-01-25 DIAGNOSIS — E559 Vitamin D deficiency, unspecified: Secondary | ICD-10-CM | POA: Diagnosis not present

## 2021-01-25 DIAGNOSIS — D638 Anemia in other chronic diseases classified elsewhere: Secondary | ICD-10-CM | POA: Diagnosis not present

## 2021-01-25 DIAGNOSIS — D6869 Other thrombophilia: Secondary | ICD-10-CM | POA: Diagnosis not present

## 2021-01-26 DIAGNOSIS — D6869 Other thrombophilia: Secondary | ICD-10-CM | POA: Diagnosis not present

## 2021-01-26 DIAGNOSIS — E559 Vitamin D deficiency, unspecified: Secondary | ICD-10-CM | POA: Diagnosis not present

## 2021-01-26 DIAGNOSIS — G311 Senile degeneration of brain, not elsewhere classified: Secondary | ICD-10-CM | POA: Diagnosis not present

## 2021-01-26 DIAGNOSIS — D638 Anemia in other chronic diseases classified elsewhere: Secondary | ICD-10-CM | POA: Diagnosis not present

## 2021-01-26 DIAGNOSIS — F0390 Unspecified dementia without behavioral disturbance: Secondary | ICD-10-CM | POA: Diagnosis not present

## 2021-01-26 DIAGNOSIS — E039 Hypothyroidism, unspecified: Secondary | ICD-10-CM | POA: Diagnosis not present

## 2021-01-29 DIAGNOSIS — E039 Hypothyroidism, unspecified: Secondary | ICD-10-CM | POA: Diagnosis not present

## 2021-01-29 DIAGNOSIS — F0390 Unspecified dementia without behavioral disturbance: Secondary | ICD-10-CM | POA: Diagnosis not present

## 2021-01-29 DIAGNOSIS — D6869 Other thrombophilia: Secondary | ICD-10-CM | POA: Diagnosis not present

## 2021-01-29 DIAGNOSIS — G311 Senile degeneration of brain, not elsewhere classified: Secondary | ICD-10-CM | POA: Diagnosis not present

## 2021-01-29 DIAGNOSIS — E559 Vitamin D deficiency, unspecified: Secondary | ICD-10-CM | POA: Diagnosis not present

## 2021-01-29 DIAGNOSIS — D638 Anemia in other chronic diseases classified elsewhere: Secondary | ICD-10-CM | POA: Diagnosis not present

## 2021-01-30 ENCOUNTER — Telehealth: Payer: Self-pay | Admitting: *Deleted

## 2021-01-30 DIAGNOSIS — D638 Anemia in other chronic diseases classified elsewhere: Secondary | ICD-10-CM | POA: Diagnosis not present

## 2021-01-30 DIAGNOSIS — E039 Hypothyroidism, unspecified: Secondary | ICD-10-CM | POA: Diagnosis not present

## 2021-01-30 DIAGNOSIS — D6869 Other thrombophilia: Secondary | ICD-10-CM | POA: Diagnosis not present

## 2021-01-30 DIAGNOSIS — E559 Vitamin D deficiency, unspecified: Secondary | ICD-10-CM | POA: Diagnosis not present

## 2021-01-30 DIAGNOSIS — G311 Senile degeneration of brain, not elsewhere classified: Secondary | ICD-10-CM | POA: Diagnosis not present

## 2021-01-30 DIAGNOSIS — F0390 Unspecified dementia without behavioral disturbance: Secondary | ICD-10-CM | POA: Diagnosis not present

## 2021-01-30 NOTE — Telephone Encounter (Signed)
1:33 pm Called and left a voicemail with patient's daughter, [name], to follow up on how patient is doing and to schedule a palliative care visit. Contact information left for return call.

## 2021-01-31 DIAGNOSIS — E559 Vitamin D deficiency, unspecified: Secondary | ICD-10-CM | POA: Diagnosis not present

## 2021-01-31 DIAGNOSIS — E039 Hypothyroidism, unspecified: Secondary | ICD-10-CM | POA: Diagnosis not present

## 2021-01-31 DIAGNOSIS — D6869 Other thrombophilia: Secondary | ICD-10-CM | POA: Diagnosis not present

## 2021-01-31 DIAGNOSIS — F0390 Unspecified dementia without behavioral disturbance: Secondary | ICD-10-CM | POA: Diagnosis not present

## 2021-01-31 DIAGNOSIS — D638 Anemia in other chronic diseases classified elsewhere: Secondary | ICD-10-CM | POA: Diagnosis not present

## 2021-01-31 DIAGNOSIS — G311 Senile degeneration of brain, not elsewhere classified: Secondary | ICD-10-CM | POA: Diagnosis not present

## 2021-02-01 DIAGNOSIS — E559 Vitamin D deficiency, unspecified: Secondary | ICD-10-CM | POA: Diagnosis not present

## 2021-02-01 DIAGNOSIS — G311 Senile degeneration of brain, not elsewhere classified: Secondary | ICD-10-CM | POA: Diagnosis not present

## 2021-02-01 DIAGNOSIS — F0390 Unspecified dementia without behavioral disturbance: Secondary | ICD-10-CM | POA: Diagnosis not present

## 2021-02-01 DIAGNOSIS — D6869 Other thrombophilia: Secondary | ICD-10-CM | POA: Diagnosis not present

## 2021-02-01 DIAGNOSIS — D638 Anemia in other chronic diseases classified elsewhere: Secondary | ICD-10-CM | POA: Diagnosis not present

## 2021-02-01 DIAGNOSIS — E039 Hypothyroidism, unspecified: Secondary | ICD-10-CM | POA: Diagnosis not present

## 2021-02-02 DIAGNOSIS — D6869 Other thrombophilia: Secondary | ICD-10-CM | POA: Diagnosis not present

## 2021-02-02 DIAGNOSIS — D638 Anemia in other chronic diseases classified elsewhere: Secondary | ICD-10-CM | POA: Diagnosis not present

## 2021-02-02 DIAGNOSIS — G311 Senile degeneration of brain, not elsewhere classified: Secondary | ICD-10-CM | POA: Diagnosis not present

## 2021-02-02 DIAGNOSIS — E559 Vitamin D deficiency, unspecified: Secondary | ICD-10-CM | POA: Diagnosis not present

## 2021-02-02 DIAGNOSIS — E039 Hypothyroidism, unspecified: Secondary | ICD-10-CM | POA: Diagnosis not present

## 2021-02-02 DIAGNOSIS — F0390 Unspecified dementia without behavioral disturbance: Secondary | ICD-10-CM | POA: Diagnosis not present

## 2021-02-05 DIAGNOSIS — F0390 Unspecified dementia without behavioral disturbance: Secondary | ICD-10-CM | POA: Diagnosis not present

## 2021-02-05 DIAGNOSIS — D638 Anemia in other chronic diseases classified elsewhere: Secondary | ICD-10-CM | POA: Diagnosis not present

## 2021-02-05 DIAGNOSIS — G311 Senile degeneration of brain, not elsewhere classified: Secondary | ICD-10-CM | POA: Diagnosis not present

## 2021-02-05 DIAGNOSIS — D6869 Other thrombophilia: Secondary | ICD-10-CM | POA: Diagnosis not present

## 2021-02-05 DIAGNOSIS — E039 Hypothyroidism, unspecified: Secondary | ICD-10-CM | POA: Diagnosis not present

## 2021-02-05 DIAGNOSIS — E559 Vitamin D deficiency, unspecified: Secondary | ICD-10-CM | POA: Diagnosis not present

## 2021-02-06 DIAGNOSIS — D638 Anemia in other chronic diseases classified elsewhere: Secondary | ICD-10-CM | POA: Diagnosis not present

## 2021-02-06 DIAGNOSIS — D6869 Other thrombophilia: Secondary | ICD-10-CM | POA: Diagnosis not present

## 2021-02-06 DIAGNOSIS — E039 Hypothyroidism, unspecified: Secondary | ICD-10-CM | POA: Diagnosis not present

## 2021-02-06 DIAGNOSIS — G311 Senile degeneration of brain, not elsewhere classified: Secondary | ICD-10-CM | POA: Diagnosis not present

## 2021-02-06 DIAGNOSIS — F0390 Unspecified dementia without behavioral disturbance: Secondary | ICD-10-CM | POA: Diagnosis not present

## 2021-02-06 DIAGNOSIS — E559 Vitamin D deficiency, unspecified: Secondary | ICD-10-CM | POA: Diagnosis not present

## 2021-02-07 DIAGNOSIS — G311 Senile degeneration of brain, not elsewhere classified: Secondary | ICD-10-CM | POA: Diagnosis not present

## 2021-02-07 DIAGNOSIS — D6869 Other thrombophilia: Secondary | ICD-10-CM | POA: Diagnosis not present

## 2021-02-07 DIAGNOSIS — E559 Vitamin D deficiency, unspecified: Secondary | ICD-10-CM | POA: Diagnosis not present

## 2021-02-07 DIAGNOSIS — E039 Hypothyroidism, unspecified: Secondary | ICD-10-CM | POA: Diagnosis not present

## 2021-02-07 DIAGNOSIS — D638 Anemia in other chronic diseases classified elsewhere: Secondary | ICD-10-CM | POA: Diagnosis not present

## 2021-02-07 DIAGNOSIS — F0390 Unspecified dementia without behavioral disturbance: Secondary | ICD-10-CM | POA: Diagnosis not present

## 2021-02-08 DIAGNOSIS — D6869 Other thrombophilia: Secondary | ICD-10-CM | POA: Diagnosis not present

## 2021-02-08 DIAGNOSIS — D638 Anemia in other chronic diseases classified elsewhere: Secondary | ICD-10-CM | POA: Diagnosis not present

## 2021-02-08 DIAGNOSIS — G311 Senile degeneration of brain, not elsewhere classified: Secondary | ICD-10-CM | POA: Diagnosis not present

## 2021-02-08 DIAGNOSIS — E559 Vitamin D deficiency, unspecified: Secondary | ICD-10-CM | POA: Diagnosis not present

## 2021-02-08 DIAGNOSIS — E039 Hypothyroidism, unspecified: Secondary | ICD-10-CM | POA: Diagnosis not present

## 2021-02-08 DIAGNOSIS — F0390 Unspecified dementia without behavioral disturbance: Secondary | ICD-10-CM | POA: Diagnosis not present

## 2021-02-09 DIAGNOSIS — E039 Hypothyroidism, unspecified: Secondary | ICD-10-CM | POA: Diagnosis not present

## 2021-02-09 DIAGNOSIS — F0390 Unspecified dementia without behavioral disturbance: Secondary | ICD-10-CM | POA: Diagnosis not present

## 2021-02-09 DIAGNOSIS — D6869 Other thrombophilia: Secondary | ICD-10-CM | POA: Diagnosis not present

## 2021-02-09 DIAGNOSIS — E559 Vitamin D deficiency, unspecified: Secondary | ICD-10-CM | POA: Diagnosis not present

## 2021-02-09 DIAGNOSIS — G311 Senile degeneration of brain, not elsewhere classified: Secondary | ICD-10-CM | POA: Diagnosis not present

## 2021-02-09 DIAGNOSIS — D638 Anemia in other chronic diseases classified elsewhere: Secondary | ICD-10-CM | POA: Diagnosis not present

## 2021-02-12 DIAGNOSIS — F0390 Unspecified dementia without behavioral disturbance: Secondary | ICD-10-CM | POA: Diagnosis not present

## 2021-02-12 DIAGNOSIS — D6869 Other thrombophilia: Secondary | ICD-10-CM | POA: Diagnosis not present

## 2021-02-12 DIAGNOSIS — G311 Senile degeneration of brain, not elsewhere classified: Secondary | ICD-10-CM | POA: Diagnosis not present

## 2021-02-12 DIAGNOSIS — D638 Anemia in other chronic diseases classified elsewhere: Secondary | ICD-10-CM | POA: Diagnosis not present

## 2021-02-12 DIAGNOSIS — E559 Vitamin D deficiency, unspecified: Secondary | ICD-10-CM | POA: Diagnosis not present

## 2021-02-12 DIAGNOSIS — E039 Hypothyroidism, unspecified: Secondary | ICD-10-CM | POA: Diagnosis not present

## 2021-02-13 DIAGNOSIS — D6869 Other thrombophilia: Secondary | ICD-10-CM | POA: Diagnosis not present

## 2021-02-13 DIAGNOSIS — F0390 Unspecified dementia without behavioral disturbance: Secondary | ICD-10-CM | POA: Diagnosis not present

## 2021-02-13 DIAGNOSIS — G311 Senile degeneration of brain, not elsewhere classified: Secondary | ICD-10-CM | POA: Diagnosis not present

## 2021-02-13 DIAGNOSIS — D638 Anemia in other chronic diseases classified elsewhere: Secondary | ICD-10-CM | POA: Diagnosis not present

## 2021-02-13 DIAGNOSIS — E039 Hypothyroidism, unspecified: Secondary | ICD-10-CM | POA: Diagnosis not present

## 2021-02-13 DIAGNOSIS — E559 Vitamin D deficiency, unspecified: Secondary | ICD-10-CM | POA: Diagnosis not present

## 2021-02-14 DIAGNOSIS — G311 Senile degeneration of brain, not elsewhere classified: Secondary | ICD-10-CM | POA: Diagnosis not present

## 2021-02-14 DIAGNOSIS — E039 Hypothyroidism, unspecified: Secondary | ICD-10-CM | POA: Diagnosis not present

## 2021-02-14 DIAGNOSIS — E559 Vitamin D deficiency, unspecified: Secondary | ICD-10-CM | POA: Diagnosis not present

## 2021-02-14 DIAGNOSIS — D6869 Other thrombophilia: Secondary | ICD-10-CM | POA: Diagnosis not present

## 2021-02-14 DIAGNOSIS — D638 Anemia in other chronic diseases classified elsewhere: Secondary | ICD-10-CM | POA: Diagnosis not present

## 2021-02-14 DIAGNOSIS — F0390 Unspecified dementia without behavioral disturbance: Secondary | ICD-10-CM | POA: Diagnosis not present

## 2021-02-20 DIAGNOSIS — M79675 Pain in left toe(s): Secondary | ICD-10-CM | POA: Diagnosis not present

## 2021-02-20 DIAGNOSIS — D638 Anemia in other chronic diseases classified elsewhere: Secondary | ICD-10-CM | POA: Diagnosis not present

## 2021-02-20 DIAGNOSIS — E559 Vitamin D deficiency, unspecified: Secondary | ICD-10-CM | POA: Diagnosis not present

## 2021-02-20 DIAGNOSIS — B351 Tinea unguium: Secondary | ICD-10-CM | POA: Diagnosis not present

## 2021-02-20 DIAGNOSIS — M79674 Pain in right toe(s): Secondary | ICD-10-CM | POA: Diagnosis not present

## 2021-02-20 DIAGNOSIS — G311 Senile degeneration of brain, not elsewhere classified: Secondary | ICD-10-CM | POA: Diagnosis not present

## 2021-02-20 DIAGNOSIS — D6869 Other thrombophilia: Secondary | ICD-10-CM | POA: Diagnosis not present

## 2021-02-20 DIAGNOSIS — F0390 Unspecified dementia without behavioral disturbance: Secondary | ICD-10-CM | POA: Diagnosis not present

## 2021-02-20 DIAGNOSIS — E039 Hypothyroidism, unspecified: Secondary | ICD-10-CM | POA: Diagnosis not present

## 2021-02-22 DIAGNOSIS — K219 Gastro-esophageal reflux disease without esophagitis: Secondary | ICD-10-CM | POA: Diagnosis not present

## 2021-02-22 DIAGNOSIS — D6869 Other thrombophilia: Secondary | ICD-10-CM | POA: Diagnosis not present

## 2021-02-22 DIAGNOSIS — M1990 Unspecified osteoarthritis, unspecified site: Secondary | ICD-10-CM | POA: Diagnosis not present

## 2021-02-22 DIAGNOSIS — F32A Depression, unspecified: Secondary | ICD-10-CM | POA: Diagnosis not present

## 2021-02-22 DIAGNOSIS — I10 Essential (primary) hypertension: Secondary | ICD-10-CM | POA: Diagnosis not present

## 2021-02-22 DIAGNOSIS — E559 Vitamin D deficiency, unspecified: Secondary | ICD-10-CM | POA: Diagnosis not present

## 2021-02-22 DIAGNOSIS — I129 Hypertensive chronic kidney disease with stage 1 through stage 4 chronic kidney disease, or unspecified chronic kidney disease: Secondary | ICD-10-CM | POA: Diagnosis not present

## 2021-02-22 DIAGNOSIS — I480 Paroxysmal atrial fibrillation: Secondary | ICD-10-CM | POA: Diagnosis not present

## 2021-02-22 DIAGNOSIS — F0390 Unspecified dementia without behavioral disturbance: Secondary | ICD-10-CM | POA: Diagnosis not present

## 2021-02-22 DIAGNOSIS — D638 Anemia in other chronic diseases classified elsewhere: Secondary | ICD-10-CM | POA: Diagnosis not present

## 2021-02-22 DIAGNOSIS — G311 Senile degeneration of brain, not elsewhere classified: Secondary | ICD-10-CM | POA: Diagnosis not present

## 2021-02-22 DIAGNOSIS — H409 Unspecified glaucoma: Secondary | ICD-10-CM | POA: Diagnosis not present

## 2021-02-22 DIAGNOSIS — E039 Hypothyroidism, unspecified: Secondary | ICD-10-CM | POA: Diagnosis not present

## 2021-02-23 DIAGNOSIS — E559 Vitamin D deficiency, unspecified: Secondary | ICD-10-CM | POA: Diagnosis not present

## 2021-02-23 DIAGNOSIS — G311 Senile degeneration of brain, not elsewhere classified: Secondary | ICD-10-CM | POA: Diagnosis not present

## 2021-02-23 DIAGNOSIS — E039 Hypothyroidism, unspecified: Secondary | ICD-10-CM | POA: Diagnosis not present

## 2021-02-23 DIAGNOSIS — D6869 Other thrombophilia: Secondary | ICD-10-CM | POA: Diagnosis not present

## 2021-02-23 DIAGNOSIS — F0390 Unspecified dementia without behavioral disturbance: Secondary | ICD-10-CM | POA: Diagnosis not present

## 2021-02-23 DIAGNOSIS — D638 Anemia in other chronic diseases classified elsewhere: Secondary | ICD-10-CM | POA: Diagnosis not present

## 2021-02-24 DIAGNOSIS — F0390 Unspecified dementia without behavioral disturbance: Secondary | ICD-10-CM | POA: Diagnosis not present

## 2021-02-24 DIAGNOSIS — D6869 Other thrombophilia: Secondary | ICD-10-CM | POA: Diagnosis not present

## 2021-02-24 DIAGNOSIS — G311 Senile degeneration of brain, not elsewhere classified: Secondary | ICD-10-CM | POA: Diagnosis not present

## 2021-02-24 DIAGNOSIS — D638 Anemia in other chronic diseases classified elsewhere: Secondary | ICD-10-CM | POA: Diagnosis not present

## 2021-02-24 DIAGNOSIS — E559 Vitamin D deficiency, unspecified: Secondary | ICD-10-CM | POA: Diagnosis not present

## 2021-02-24 DIAGNOSIS — E039 Hypothyroidism, unspecified: Secondary | ICD-10-CM | POA: Diagnosis not present

## 2021-02-27 DIAGNOSIS — E039 Hypothyroidism, unspecified: Secondary | ICD-10-CM | POA: Diagnosis not present

## 2021-02-27 DIAGNOSIS — F0390 Unspecified dementia without behavioral disturbance: Secondary | ICD-10-CM | POA: Diagnosis not present

## 2021-02-27 DIAGNOSIS — D638 Anemia in other chronic diseases classified elsewhere: Secondary | ICD-10-CM | POA: Diagnosis not present

## 2021-02-27 DIAGNOSIS — G311 Senile degeneration of brain, not elsewhere classified: Secondary | ICD-10-CM | POA: Diagnosis not present

## 2021-02-27 DIAGNOSIS — E559 Vitamin D deficiency, unspecified: Secondary | ICD-10-CM | POA: Diagnosis not present

## 2021-02-27 DIAGNOSIS — D6869 Other thrombophilia: Secondary | ICD-10-CM | POA: Diagnosis not present

## 2021-02-28 DIAGNOSIS — D6869 Other thrombophilia: Secondary | ICD-10-CM | POA: Diagnosis not present

## 2021-02-28 DIAGNOSIS — F0390 Unspecified dementia without behavioral disturbance: Secondary | ICD-10-CM | POA: Diagnosis not present

## 2021-02-28 DIAGNOSIS — D638 Anemia in other chronic diseases classified elsewhere: Secondary | ICD-10-CM | POA: Diagnosis not present

## 2021-02-28 DIAGNOSIS — N3281 Overactive bladder: Secondary | ICD-10-CM | POA: Diagnosis not present

## 2021-02-28 DIAGNOSIS — Z515 Encounter for palliative care: Secondary | ICD-10-CM | POA: Diagnosis not present

## 2021-02-28 DIAGNOSIS — G309 Alzheimer's disease, unspecified: Secondary | ICD-10-CM | POA: Diagnosis not present

## 2021-02-28 DIAGNOSIS — H409 Unspecified glaucoma: Secondary | ICD-10-CM | POA: Diagnosis not present

## 2021-02-28 DIAGNOSIS — Z8616 Personal history of COVID-19: Secondary | ICD-10-CM | POA: Diagnosis not present

## 2021-02-28 DIAGNOSIS — G500 Trigeminal neuralgia: Secondary | ICD-10-CM | POA: Diagnosis not present

## 2021-02-28 DIAGNOSIS — E785 Hyperlipidemia, unspecified: Secondary | ICD-10-CM | POA: Diagnosis not present

## 2021-02-28 DIAGNOSIS — I10 Essential (primary) hypertension: Secondary | ICD-10-CM | POA: Diagnosis not present

## 2021-02-28 DIAGNOSIS — E039 Hypothyroidism, unspecified: Secondary | ICD-10-CM | POA: Diagnosis not present

## 2021-02-28 DIAGNOSIS — E559 Vitamin D deficiency, unspecified: Secondary | ICD-10-CM | POA: Diagnosis not present

## 2021-02-28 DIAGNOSIS — L299 Pruritus, unspecified: Secondary | ICD-10-CM | POA: Diagnosis not present

## 2021-02-28 DIAGNOSIS — I82409 Acute embolism and thrombosis of unspecified deep veins of unspecified lower extremity: Secondary | ICD-10-CM | POA: Diagnosis not present

## 2021-02-28 DIAGNOSIS — G311 Senile degeneration of brain, not elsewhere classified: Secondary | ICD-10-CM | POA: Diagnosis not present

## 2021-02-28 DIAGNOSIS — N183 Chronic kidney disease, stage 3 unspecified: Secondary | ICD-10-CM | POA: Diagnosis not present

## 2021-03-01 DIAGNOSIS — G311 Senile degeneration of brain, not elsewhere classified: Secondary | ICD-10-CM | POA: Diagnosis not present

## 2021-03-01 DIAGNOSIS — D638 Anemia in other chronic diseases classified elsewhere: Secondary | ICD-10-CM | POA: Diagnosis not present

## 2021-03-01 DIAGNOSIS — E559 Vitamin D deficiency, unspecified: Secondary | ICD-10-CM | POA: Diagnosis not present

## 2021-03-01 DIAGNOSIS — D6869 Other thrombophilia: Secondary | ICD-10-CM | POA: Diagnosis not present

## 2021-03-01 DIAGNOSIS — E039 Hypothyroidism, unspecified: Secondary | ICD-10-CM | POA: Diagnosis not present

## 2021-03-01 DIAGNOSIS — F0390 Unspecified dementia without behavioral disturbance: Secondary | ICD-10-CM | POA: Diagnosis not present

## 2021-03-02 DIAGNOSIS — F0390 Unspecified dementia without behavioral disturbance: Secondary | ICD-10-CM | POA: Diagnosis not present

## 2021-03-02 DIAGNOSIS — G311 Senile degeneration of brain, not elsewhere classified: Secondary | ICD-10-CM | POA: Diagnosis not present

## 2021-03-02 DIAGNOSIS — D638 Anemia in other chronic diseases classified elsewhere: Secondary | ICD-10-CM | POA: Diagnosis not present

## 2021-03-02 DIAGNOSIS — E039 Hypothyroidism, unspecified: Secondary | ICD-10-CM | POA: Diagnosis not present

## 2021-03-02 DIAGNOSIS — E559 Vitamin D deficiency, unspecified: Secondary | ICD-10-CM | POA: Diagnosis not present

## 2021-03-02 DIAGNOSIS — D6869 Other thrombophilia: Secondary | ICD-10-CM | POA: Diagnosis not present

## 2021-03-05 DIAGNOSIS — D638 Anemia in other chronic diseases classified elsewhere: Secondary | ICD-10-CM | POA: Diagnosis not present

## 2021-03-05 DIAGNOSIS — E559 Vitamin D deficiency, unspecified: Secondary | ICD-10-CM | POA: Diagnosis not present

## 2021-03-05 DIAGNOSIS — D6869 Other thrombophilia: Secondary | ICD-10-CM | POA: Diagnosis not present

## 2021-03-05 DIAGNOSIS — G311 Senile degeneration of brain, not elsewhere classified: Secondary | ICD-10-CM | POA: Diagnosis not present

## 2021-03-05 DIAGNOSIS — E039 Hypothyroidism, unspecified: Secondary | ICD-10-CM | POA: Diagnosis not present

## 2021-03-05 DIAGNOSIS — F0390 Unspecified dementia without behavioral disturbance: Secondary | ICD-10-CM | POA: Diagnosis not present

## 2021-03-06 DIAGNOSIS — D638 Anemia in other chronic diseases classified elsewhere: Secondary | ICD-10-CM | POA: Diagnosis not present

## 2021-03-06 DIAGNOSIS — D6869 Other thrombophilia: Secondary | ICD-10-CM | POA: Diagnosis not present

## 2021-03-06 DIAGNOSIS — E559 Vitamin D deficiency, unspecified: Secondary | ICD-10-CM | POA: Diagnosis not present

## 2021-03-06 DIAGNOSIS — G311 Senile degeneration of brain, not elsewhere classified: Secondary | ICD-10-CM | POA: Diagnosis not present

## 2021-03-06 DIAGNOSIS — E039 Hypothyroidism, unspecified: Secondary | ICD-10-CM | POA: Diagnosis not present

## 2021-03-06 DIAGNOSIS — F0390 Unspecified dementia without behavioral disturbance: Secondary | ICD-10-CM | POA: Diagnosis not present

## 2021-03-07 DIAGNOSIS — E559 Vitamin D deficiency, unspecified: Secondary | ICD-10-CM | POA: Diagnosis not present

## 2021-03-07 DIAGNOSIS — E039 Hypothyroidism, unspecified: Secondary | ICD-10-CM | POA: Diagnosis not present

## 2021-03-07 DIAGNOSIS — D6869 Other thrombophilia: Secondary | ICD-10-CM | POA: Diagnosis not present

## 2021-03-07 DIAGNOSIS — F0390 Unspecified dementia without behavioral disturbance: Secondary | ICD-10-CM | POA: Diagnosis not present

## 2021-03-07 DIAGNOSIS — D638 Anemia in other chronic diseases classified elsewhere: Secondary | ICD-10-CM | POA: Diagnosis not present

## 2021-03-07 DIAGNOSIS — G311 Senile degeneration of brain, not elsewhere classified: Secondary | ICD-10-CM | POA: Diagnosis not present

## 2021-03-09 DIAGNOSIS — D6869 Other thrombophilia: Secondary | ICD-10-CM | POA: Diagnosis not present

## 2021-03-09 DIAGNOSIS — D638 Anemia in other chronic diseases classified elsewhere: Secondary | ICD-10-CM | POA: Diagnosis not present

## 2021-03-09 DIAGNOSIS — E039 Hypothyroidism, unspecified: Secondary | ICD-10-CM | POA: Diagnosis not present

## 2021-03-09 DIAGNOSIS — E559 Vitamin D deficiency, unspecified: Secondary | ICD-10-CM | POA: Diagnosis not present

## 2021-03-09 DIAGNOSIS — F0390 Unspecified dementia without behavioral disturbance: Secondary | ICD-10-CM | POA: Diagnosis not present

## 2021-03-09 DIAGNOSIS — G311 Senile degeneration of brain, not elsewhere classified: Secondary | ICD-10-CM | POA: Diagnosis not present

## 2021-03-12 ENCOUNTER — Ambulatory Visit: Payer: Medicare Other | Admitting: Neurology

## 2021-03-12 DIAGNOSIS — E039 Hypothyroidism, unspecified: Secondary | ICD-10-CM | POA: Diagnosis not present

## 2021-03-12 DIAGNOSIS — G311 Senile degeneration of brain, not elsewhere classified: Secondary | ICD-10-CM | POA: Diagnosis not present

## 2021-03-12 DIAGNOSIS — D6869 Other thrombophilia: Secondary | ICD-10-CM | POA: Diagnosis not present

## 2021-03-12 DIAGNOSIS — D638 Anemia in other chronic diseases classified elsewhere: Secondary | ICD-10-CM | POA: Diagnosis not present

## 2021-03-12 DIAGNOSIS — E559 Vitamin D deficiency, unspecified: Secondary | ICD-10-CM | POA: Diagnosis not present

## 2021-03-12 DIAGNOSIS — F0390 Unspecified dementia without behavioral disturbance: Secondary | ICD-10-CM | POA: Diagnosis not present

## 2021-03-13 DIAGNOSIS — G311 Senile degeneration of brain, not elsewhere classified: Secondary | ICD-10-CM | POA: Diagnosis not present

## 2021-03-13 DIAGNOSIS — D6869 Other thrombophilia: Secondary | ICD-10-CM | POA: Diagnosis not present

## 2021-03-13 DIAGNOSIS — E039 Hypothyroidism, unspecified: Secondary | ICD-10-CM | POA: Diagnosis not present

## 2021-03-13 DIAGNOSIS — F0390 Unspecified dementia without behavioral disturbance: Secondary | ICD-10-CM | POA: Diagnosis not present

## 2021-03-13 DIAGNOSIS — D638 Anemia in other chronic diseases classified elsewhere: Secondary | ICD-10-CM | POA: Diagnosis not present

## 2021-03-13 DIAGNOSIS — E559 Vitamin D deficiency, unspecified: Secondary | ICD-10-CM | POA: Diagnosis not present

## 2021-03-14 DIAGNOSIS — E039 Hypothyroidism, unspecified: Secondary | ICD-10-CM | POA: Diagnosis not present

## 2021-03-14 DIAGNOSIS — F0390 Unspecified dementia without behavioral disturbance: Secondary | ICD-10-CM | POA: Diagnosis not present

## 2021-03-14 DIAGNOSIS — D638 Anemia in other chronic diseases classified elsewhere: Secondary | ICD-10-CM | POA: Diagnosis not present

## 2021-03-14 DIAGNOSIS — G311 Senile degeneration of brain, not elsewhere classified: Secondary | ICD-10-CM | POA: Diagnosis not present

## 2021-03-14 DIAGNOSIS — D6869 Other thrombophilia: Secondary | ICD-10-CM | POA: Diagnosis not present

## 2021-03-14 DIAGNOSIS — E559 Vitamin D deficiency, unspecified: Secondary | ICD-10-CM | POA: Diagnosis not present

## 2021-03-15 DIAGNOSIS — E559 Vitamin D deficiency, unspecified: Secondary | ICD-10-CM | POA: Diagnosis not present

## 2021-03-15 DIAGNOSIS — D638 Anemia in other chronic diseases classified elsewhere: Secondary | ICD-10-CM | POA: Diagnosis not present

## 2021-03-15 DIAGNOSIS — D6869 Other thrombophilia: Secondary | ICD-10-CM | POA: Diagnosis not present

## 2021-03-15 DIAGNOSIS — G311 Senile degeneration of brain, not elsewhere classified: Secondary | ICD-10-CM | POA: Diagnosis not present

## 2021-03-15 DIAGNOSIS — E039 Hypothyroidism, unspecified: Secondary | ICD-10-CM | POA: Diagnosis not present

## 2021-03-15 DIAGNOSIS — F0390 Unspecified dementia without behavioral disturbance: Secondary | ICD-10-CM | POA: Diagnosis not present

## 2021-03-16 DIAGNOSIS — E039 Hypothyroidism, unspecified: Secondary | ICD-10-CM | POA: Diagnosis not present

## 2021-03-16 DIAGNOSIS — E559 Vitamin D deficiency, unspecified: Secondary | ICD-10-CM | POA: Diagnosis not present

## 2021-03-16 DIAGNOSIS — D6869 Other thrombophilia: Secondary | ICD-10-CM | POA: Diagnosis not present

## 2021-03-16 DIAGNOSIS — F0390 Unspecified dementia without behavioral disturbance: Secondary | ICD-10-CM | POA: Diagnosis not present

## 2021-03-16 DIAGNOSIS — D638 Anemia in other chronic diseases classified elsewhere: Secondary | ICD-10-CM | POA: Diagnosis not present

## 2021-03-16 DIAGNOSIS — G311 Senile degeneration of brain, not elsewhere classified: Secondary | ICD-10-CM | POA: Diagnosis not present

## 2021-03-19 DIAGNOSIS — D638 Anemia in other chronic diseases classified elsewhere: Secondary | ICD-10-CM | POA: Diagnosis not present

## 2021-03-19 DIAGNOSIS — G311 Senile degeneration of brain, not elsewhere classified: Secondary | ICD-10-CM | POA: Diagnosis not present

## 2021-03-19 DIAGNOSIS — F0390 Unspecified dementia without behavioral disturbance: Secondary | ICD-10-CM | POA: Diagnosis not present

## 2021-03-19 DIAGNOSIS — D6869 Other thrombophilia: Secondary | ICD-10-CM | POA: Diagnosis not present

## 2021-03-19 DIAGNOSIS — E039 Hypothyroidism, unspecified: Secondary | ICD-10-CM | POA: Diagnosis not present

## 2021-03-19 DIAGNOSIS — E559 Vitamin D deficiency, unspecified: Secondary | ICD-10-CM | POA: Diagnosis not present

## 2021-03-20 DIAGNOSIS — E039 Hypothyroidism, unspecified: Secondary | ICD-10-CM | POA: Diagnosis not present

## 2021-03-20 DIAGNOSIS — F0390 Unspecified dementia without behavioral disturbance: Secondary | ICD-10-CM | POA: Diagnosis not present

## 2021-03-20 DIAGNOSIS — G311 Senile degeneration of brain, not elsewhere classified: Secondary | ICD-10-CM | POA: Diagnosis not present

## 2021-03-20 DIAGNOSIS — D6869 Other thrombophilia: Secondary | ICD-10-CM | POA: Diagnosis not present

## 2021-03-20 DIAGNOSIS — D638 Anemia in other chronic diseases classified elsewhere: Secondary | ICD-10-CM | POA: Diagnosis not present

## 2021-03-20 DIAGNOSIS — E559 Vitamin D deficiency, unspecified: Secondary | ICD-10-CM | POA: Diagnosis not present

## 2021-03-21 DIAGNOSIS — F0390 Unspecified dementia without behavioral disturbance: Secondary | ICD-10-CM | POA: Diagnosis not present

## 2021-03-21 DIAGNOSIS — E559 Vitamin D deficiency, unspecified: Secondary | ICD-10-CM | POA: Diagnosis not present

## 2021-03-21 DIAGNOSIS — D638 Anemia in other chronic diseases classified elsewhere: Secondary | ICD-10-CM | POA: Diagnosis not present

## 2021-03-21 DIAGNOSIS — E039 Hypothyroidism, unspecified: Secondary | ICD-10-CM | POA: Diagnosis not present

## 2021-03-21 DIAGNOSIS — G311 Senile degeneration of brain, not elsewhere classified: Secondary | ICD-10-CM | POA: Diagnosis not present

## 2021-03-21 DIAGNOSIS — D6869 Other thrombophilia: Secondary | ICD-10-CM | POA: Diagnosis not present

## 2021-03-22 ENCOUNTER — Ambulatory Visit: Payer: Medicare Other | Admitting: Neurology

## 2021-03-22 DIAGNOSIS — D638 Anemia in other chronic diseases classified elsewhere: Secondary | ICD-10-CM | POA: Diagnosis not present

## 2021-03-22 DIAGNOSIS — E559 Vitamin D deficiency, unspecified: Secondary | ICD-10-CM | POA: Diagnosis not present

## 2021-03-22 DIAGNOSIS — E039 Hypothyroidism, unspecified: Secondary | ICD-10-CM | POA: Diagnosis not present

## 2021-03-22 DIAGNOSIS — D6869 Other thrombophilia: Secondary | ICD-10-CM | POA: Diagnosis not present

## 2021-03-22 DIAGNOSIS — F0390 Unspecified dementia without behavioral disturbance: Secondary | ICD-10-CM | POA: Diagnosis not present

## 2021-03-22 DIAGNOSIS — G311 Senile degeneration of brain, not elsewhere classified: Secondary | ICD-10-CM | POA: Diagnosis not present

## 2021-03-23 DIAGNOSIS — G311 Senile degeneration of brain, not elsewhere classified: Secondary | ICD-10-CM | POA: Diagnosis not present

## 2021-03-23 DIAGNOSIS — E039 Hypothyroidism, unspecified: Secondary | ICD-10-CM | POA: Diagnosis not present

## 2021-03-23 DIAGNOSIS — E559 Vitamin D deficiency, unspecified: Secondary | ICD-10-CM | POA: Diagnosis not present

## 2021-03-23 DIAGNOSIS — D6869 Other thrombophilia: Secondary | ICD-10-CM | POA: Diagnosis not present

## 2021-03-23 DIAGNOSIS — F0390 Unspecified dementia without behavioral disturbance: Secondary | ICD-10-CM | POA: Diagnosis not present

## 2021-03-23 DIAGNOSIS — D638 Anemia in other chronic diseases classified elsewhere: Secondary | ICD-10-CM | POA: Diagnosis not present

## 2021-03-25 DIAGNOSIS — H409 Unspecified glaucoma: Secondary | ICD-10-CM | POA: Diagnosis not present

## 2021-03-25 DIAGNOSIS — G311 Senile degeneration of brain, not elsewhere classified: Secondary | ICD-10-CM | POA: Diagnosis not present

## 2021-03-25 DIAGNOSIS — E559 Vitamin D deficiency, unspecified: Secondary | ICD-10-CM | POA: Diagnosis not present

## 2021-03-25 DIAGNOSIS — K219 Gastro-esophageal reflux disease without esophagitis: Secondary | ICD-10-CM | POA: Diagnosis not present

## 2021-03-25 DIAGNOSIS — F32A Depression, unspecified: Secondary | ICD-10-CM | POA: Diagnosis not present

## 2021-03-25 DIAGNOSIS — I10 Essential (primary) hypertension: Secondary | ICD-10-CM | POA: Diagnosis not present

## 2021-03-25 DIAGNOSIS — D638 Anemia in other chronic diseases classified elsewhere: Secondary | ICD-10-CM | POA: Diagnosis not present

## 2021-03-25 DIAGNOSIS — F0390 Unspecified dementia without behavioral disturbance: Secondary | ICD-10-CM | POA: Diagnosis not present

## 2021-03-25 DIAGNOSIS — I480 Paroxysmal atrial fibrillation: Secondary | ICD-10-CM | POA: Diagnosis not present

## 2021-03-25 DIAGNOSIS — D6869 Other thrombophilia: Secondary | ICD-10-CM | POA: Diagnosis not present

## 2021-03-25 DIAGNOSIS — I129 Hypertensive chronic kidney disease with stage 1 through stage 4 chronic kidney disease, or unspecified chronic kidney disease: Secondary | ICD-10-CM | POA: Diagnosis not present

## 2021-03-25 DIAGNOSIS — E039 Hypothyroidism, unspecified: Secondary | ICD-10-CM | POA: Diagnosis not present

## 2021-03-25 DIAGNOSIS — M1990 Unspecified osteoarthritis, unspecified site: Secondary | ICD-10-CM | POA: Diagnosis not present

## 2021-03-26 DIAGNOSIS — N183 Chronic kidney disease, stage 3 unspecified: Secondary | ICD-10-CM | POA: Diagnosis not present

## 2021-03-26 DIAGNOSIS — E039 Hypothyroidism, unspecified: Secondary | ICD-10-CM | POA: Diagnosis not present

## 2021-03-26 DIAGNOSIS — F0390 Unspecified dementia without behavioral disturbance: Secondary | ICD-10-CM | POA: Diagnosis not present

## 2021-03-26 DIAGNOSIS — D6869 Other thrombophilia: Secondary | ICD-10-CM | POA: Diagnosis not present

## 2021-03-26 DIAGNOSIS — E559 Vitamin D deficiency, unspecified: Secondary | ICD-10-CM | POA: Diagnosis not present

## 2021-03-26 DIAGNOSIS — G311 Senile degeneration of brain, not elsewhere classified: Secondary | ICD-10-CM | POA: Diagnosis not present

## 2021-03-26 DIAGNOSIS — D638 Anemia in other chronic diseases classified elsewhere: Secondary | ICD-10-CM | POA: Diagnosis not present

## 2021-03-26 DIAGNOSIS — R350 Frequency of micturition: Secondary | ICD-10-CM | POA: Diagnosis not present

## 2021-03-27 DIAGNOSIS — E559 Vitamin D deficiency, unspecified: Secondary | ICD-10-CM | POA: Diagnosis not present

## 2021-03-27 DIAGNOSIS — N390 Urinary tract infection, site not specified: Secondary | ICD-10-CM | POA: Diagnosis not present

## 2021-03-27 DIAGNOSIS — N183 Chronic kidney disease, stage 3 unspecified: Secondary | ICD-10-CM | POA: Diagnosis not present

## 2021-03-27 DIAGNOSIS — E039 Hypothyroidism, unspecified: Secondary | ICD-10-CM | POA: Diagnosis not present

## 2021-03-27 DIAGNOSIS — G311 Senile degeneration of brain, not elsewhere classified: Secondary | ICD-10-CM | POA: Diagnosis not present

## 2021-03-27 DIAGNOSIS — F0390 Unspecified dementia without behavioral disturbance: Secondary | ICD-10-CM | POA: Diagnosis not present

## 2021-03-27 DIAGNOSIS — D6869 Other thrombophilia: Secondary | ICD-10-CM | POA: Diagnosis not present

## 2021-03-27 DIAGNOSIS — D638 Anemia in other chronic diseases classified elsewhere: Secondary | ICD-10-CM | POA: Diagnosis not present

## 2021-03-28 DIAGNOSIS — D638 Anemia in other chronic diseases classified elsewhere: Secondary | ICD-10-CM | POA: Diagnosis not present

## 2021-03-28 DIAGNOSIS — E559 Vitamin D deficiency, unspecified: Secondary | ICD-10-CM | POA: Diagnosis not present

## 2021-03-28 DIAGNOSIS — D6869 Other thrombophilia: Secondary | ICD-10-CM | POA: Diagnosis not present

## 2021-03-28 DIAGNOSIS — F0390 Unspecified dementia without behavioral disturbance: Secondary | ICD-10-CM | POA: Diagnosis not present

## 2021-03-28 DIAGNOSIS — G311 Senile degeneration of brain, not elsewhere classified: Secondary | ICD-10-CM | POA: Diagnosis not present

## 2021-03-28 DIAGNOSIS — E039 Hypothyroidism, unspecified: Secondary | ICD-10-CM | POA: Diagnosis not present

## 2021-03-29 DIAGNOSIS — G311 Senile degeneration of brain, not elsewhere classified: Secondary | ICD-10-CM | POA: Diagnosis not present

## 2021-03-29 DIAGNOSIS — E039 Hypothyroidism, unspecified: Secondary | ICD-10-CM | POA: Diagnosis not present

## 2021-03-29 DIAGNOSIS — F0390 Unspecified dementia without behavioral disturbance: Secondary | ICD-10-CM | POA: Diagnosis not present

## 2021-03-29 DIAGNOSIS — E559 Vitamin D deficiency, unspecified: Secondary | ICD-10-CM | POA: Diagnosis not present

## 2021-03-29 DIAGNOSIS — D638 Anemia in other chronic diseases classified elsewhere: Secondary | ICD-10-CM | POA: Diagnosis not present

## 2021-03-29 DIAGNOSIS — D6869 Other thrombophilia: Secondary | ICD-10-CM | POA: Diagnosis not present

## 2021-03-30 DIAGNOSIS — G311 Senile degeneration of brain, not elsewhere classified: Secondary | ICD-10-CM | POA: Diagnosis not present

## 2021-03-30 DIAGNOSIS — D6869 Other thrombophilia: Secondary | ICD-10-CM | POA: Diagnosis not present

## 2021-03-30 DIAGNOSIS — F0390 Unspecified dementia without behavioral disturbance: Secondary | ICD-10-CM | POA: Diagnosis not present

## 2021-03-30 DIAGNOSIS — E559 Vitamin D deficiency, unspecified: Secondary | ICD-10-CM | POA: Diagnosis not present

## 2021-03-30 DIAGNOSIS — E039 Hypothyroidism, unspecified: Secondary | ICD-10-CM | POA: Diagnosis not present

## 2021-03-30 DIAGNOSIS — D638 Anemia in other chronic diseases classified elsewhere: Secondary | ICD-10-CM | POA: Diagnosis not present

## 2021-04-02 DIAGNOSIS — D6869 Other thrombophilia: Secondary | ICD-10-CM | POA: Diagnosis not present

## 2021-04-02 DIAGNOSIS — D638 Anemia in other chronic diseases classified elsewhere: Secondary | ICD-10-CM | POA: Diagnosis not present

## 2021-04-02 DIAGNOSIS — F0390 Unspecified dementia without behavioral disturbance: Secondary | ICD-10-CM | POA: Diagnosis not present

## 2021-04-02 DIAGNOSIS — E559 Vitamin D deficiency, unspecified: Secondary | ICD-10-CM | POA: Diagnosis not present

## 2021-04-02 DIAGNOSIS — E119 Type 2 diabetes mellitus without complications: Secondary | ICD-10-CM | POA: Diagnosis not present

## 2021-04-02 DIAGNOSIS — G311 Senile degeneration of brain, not elsewhere classified: Secondary | ICD-10-CM | POA: Diagnosis not present

## 2021-04-02 DIAGNOSIS — N183 Chronic kidney disease, stage 3 unspecified: Secondary | ICD-10-CM | POA: Diagnosis not present

## 2021-04-02 DIAGNOSIS — E039 Hypothyroidism, unspecified: Secondary | ICD-10-CM | POA: Diagnosis not present

## 2021-04-02 DIAGNOSIS — I10 Essential (primary) hypertension: Secondary | ICD-10-CM | POA: Diagnosis not present

## 2021-04-03 DIAGNOSIS — F0390 Unspecified dementia without behavioral disturbance: Secondary | ICD-10-CM | POA: Diagnosis not present

## 2021-04-03 DIAGNOSIS — E559 Vitamin D deficiency, unspecified: Secondary | ICD-10-CM | POA: Diagnosis not present

## 2021-04-03 DIAGNOSIS — E039 Hypothyroidism, unspecified: Secondary | ICD-10-CM | POA: Diagnosis not present

## 2021-04-03 DIAGNOSIS — D638 Anemia in other chronic diseases classified elsewhere: Secondary | ICD-10-CM | POA: Diagnosis not present

## 2021-04-03 DIAGNOSIS — D6869 Other thrombophilia: Secondary | ICD-10-CM | POA: Diagnosis not present

## 2021-04-03 DIAGNOSIS — G311 Senile degeneration of brain, not elsewhere classified: Secondary | ICD-10-CM | POA: Diagnosis not present

## 2021-04-04 DIAGNOSIS — E559 Vitamin D deficiency, unspecified: Secondary | ICD-10-CM | POA: Diagnosis not present

## 2021-04-04 DIAGNOSIS — F0390 Unspecified dementia without behavioral disturbance: Secondary | ICD-10-CM | POA: Diagnosis not present

## 2021-04-04 DIAGNOSIS — E039 Hypothyroidism, unspecified: Secondary | ICD-10-CM | POA: Diagnosis not present

## 2021-04-04 DIAGNOSIS — G311 Senile degeneration of brain, not elsewhere classified: Secondary | ICD-10-CM | POA: Diagnosis not present

## 2021-04-04 DIAGNOSIS — D6869 Other thrombophilia: Secondary | ICD-10-CM | POA: Diagnosis not present

## 2021-04-04 DIAGNOSIS — D638 Anemia in other chronic diseases classified elsewhere: Secondary | ICD-10-CM | POA: Diagnosis not present

## 2021-04-06 DIAGNOSIS — F0390 Unspecified dementia without behavioral disturbance: Secondary | ICD-10-CM | POA: Diagnosis not present

## 2021-04-06 DIAGNOSIS — G311 Senile degeneration of brain, not elsewhere classified: Secondary | ICD-10-CM | POA: Diagnosis not present

## 2021-04-06 DIAGNOSIS — D638 Anemia in other chronic diseases classified elsewhere: Secondary | ICD-10-CM | POA: Diagnosis not present

## 2021-04-06 DIAGNOSIS — E039 Hypothyroidism, unspecified: Secondary | ICD-10-CM | POA: Diagnosis not present

## 2021-04-06 DIAGNOSIS — E559 Vitamin D deficiency, unspecified: Secondary | ICD-10-CM | POA: Diagnosis not present

## 2021-04-06 DIAGNOSIS — D6869 Other thrombophilia: Secondary | ICD-10-CM | POA: Diagnosis not present

## 2021-04-10 DIAGNOSIS — G311 Senile degeneration of brain, not elsewhere classified: Secondary | ICD-10-CM | POA: Diagnosis not present

## 2021-04-10 DIAGNOSIS — F0390 Unspecified dementia without behavioral disturbance: Secondary | ICD-10-CM | POA: Diagnosis not present

## 2021-04-10 DIAGNOSIS — D638 Anemia in other chronic diseases classified elsewhere: Secondary | ICD-10-CM | POA: Diagnosis not present

## 2021-04-10 DIAGNOSIS — E559 Vitamin D deficiency, unspecified: Secondary | ICD-10-CM | POA: Diagnosis not present

## 2021-04-10 DIAGNOSIS — D6869 Other thrombophilia: Secondary | ICD-10-CM | POA: Diagnosis not present

## 2021-04-10 DIAGNOSIS — E039 Hypothyroidism, unspecified: Secondary | ICD-10-CM | POA: Diagnosis not present

## 2021-04-12 DIAGNOSIS — E559 Vitamin D deficiency, unspecified: Secondary | ICD-10-CM | POA: Diagnosis not present

## 2021-04-12 DIAGNOSIS — D638 Anemia in other chronic diseases classified elsewhere: Secondary | ICD-10-CM | POA: Diagnosis not present

## 2021-04-12 DIAGNOSIS — F0390 Unspecified dementia without behavioral disturbance: Secondary | ICD-10-CM | POA: Diagnosis not present

## 2021-04-12 DIAGNOSIS — G311 Senile degeneration of brain, not elsewhere classified: Secondary | ICD-10-CM | POA: Diagnosis not present

## 2021-04-12 DIAGNOSIS — D6869 Other thrombophilia: Secondary | ICD-10-CM | POA: Diagnosis not present

## 2021-04-12 DIAGNOSIS — E039 Hypothyroidism, unspecified: Secondary | ICD-10-CM | POA: Diagnosis not present

## 2021-04-13 DIAGNOSIS — F0390 Unspecified dementia without behavioral disturbance: Secondary | ICD-10-CM | POA: Diagnosis not present

## 2021-04-13 DIAGNOSIS — E559 Vitamin D deficiency, unspecified: Secondary | ICD-10-CM | POA: Diagnosis not present

## 2021-04-13 DIAGNOSIS — D638 Anemia in other chronic diseases classified elsewhere: Secondary | ICD-10-CM | POA: Diagnosis not present

## 2021-04-13 DIAGNOSIS — G311 Senile degeneration of brain, not elsewhere classified: Secondary | ICD-10-CM | POA: Diagnosis not present

## 2021-04-13 DIAGNOSIS — E039 Hypothyroidism, unspecified: Secondary | ICD-10-CM | POA: Diagnosis not present

## 2021-04-13 DIAGNOSIS — D6869 Other thrombophilia: Secondary | ICD-10-CM | POA: Diagnosis not present

## 2021-04-16 DIAGNOSIS — G311 Senile degeneration of brain, not elsewhere classified: Secondary | ICD-10-CM | POA: Diagnosis not present

## 2021-04-16 DIAGNOSIS — D638 Anemia in other chronic diseases classified elsewhere: Secondary | ICD-10-CM | POA: Diagnosis not present

## 2021-04-16 DIAGNOSIS — D6869 Other thrombophilia: Secondary | ICD-10-CM | POA: Diagnosis not present

## 2021-04-16 DIAGNOSIS — E039 Hypothyroidism, unspecified: Secondary | ICD-10-CM | POA: Diagnosis not present

## 2021-04-16 DIAGNOSIS — E559 Vitamin D deficiency, unspecified: Secondary | ICD-10-CM | POA: Diagnosis not present

## 2021-04-16 DIAGNOSIS — F0390 Unspecified dementia without behavioral disturbance: Secondary | ICD-10-CM | POA: Diagnosis not present

## 2021-04-17 DIAGNOSIS — F0390 Unspecified dementia without behavioral disturbance: Secondary | ICD-10-CM | POA: Diagnosis not present

## 2021-04-17 DIAGNOSIS — G311 Senile degeneration of brain, not elsewhere classified: Secondary | ICD-10-CM | POA: Diagnosis not present

## 2021-04-17 DIAGNOSIS — D6869 Other thrombophilia: Secondary | ICD-10-CM | POA: Diagnosis not present

## 2021-04-17 DIAGNOSIS — E039 Hypothyroidism, unspecified: Secondary | ICD-10-CM | POA: Diagnosis not present

## 2021-04-17 DIAGNOSIS — D638 Anemia in other chronic diseases classified elsewhere: Secondary | ICD-10-CM | POA: Diagnosis not present

## 2021-04-17 DIAGNOSIS — E559 Vitamin D deficiency, unspecified: Secondary | ICD-10-CM | POA: Diagnosis not present

## 2021-04-18 DIAGNOSIS — D638 Anemia in other chronic diseases classified elsewhere: Secondary | ICD-10-CM | POA: Diagnosis not present

## 2021-04-18 DIAGNOSIS — G311 Senile degeneration of brain, not elsewhere classified: Secondary | ICD-10-CM | POA: Diagnosis not present

## 2021-04-18 DIAGNOSIS — F0390 Unspecified dementia without behavioral disturbance: Secondary | ICD-10-CM | POA: Diagnosis not present

## 2021-04-18 DIAGNOSIS — E039 Hypothyroidism, unspecified: Secondary | ICD-10-CM | POA: Diagnosis not present

## 2021-04-18 DIAGNOSIS — E559 Vitamin D deficiency, unspecified: Secondary | ICD-10-CM | POA: Diagnosis not present

## 2021-04-18 DIAGNOSIS — D6869 Other thrombophilia: Secondary | ICD-10-CM | POA: Diagnosis not present

## 2021-04-20 DIAGNOSIS — Z515 Encounter for palliative care: Secondary | ICD-10-CM | POA: Diagnosis not present

## 2021-04-20 DIAGNOSIS — N183 Chronic kidney disease, stage 3 unspecified: Secondary | ICD-10-CM | POA: Diagnosis not present

## 2021-04-20 DIAGNOSIS — H409 Unspecified glaucoma: Secondary | ICD-10-CM | POA: Diagnosis not present

## 2021-04-20 DIAGNOSIS — E039 Hypothyroidism, unspecified: Secondary | ICD-10-CM | POA: Diagnosis not present

## 2021-04-20 DIAGNOSIS — E785 Hyperlipidemia, unspecified: Secondary | ICD-10-CM | POA: Diagnosis not present

## 2021-04-20 DIAGNOSIS — I10 Essential (primary) hypertension: Secondary | ICD-10-CM | POA: Diagnosis not present

## 2021-04-20 DIAGNOSIS — G311 Senile degeneration of brain, not elsewhere classified: Secondary | ICD-10-CM | POA: Diagnosis not present

## 2021-04-20 DIAGNOSIS — G309 Alzheimer's disease, unspecified: Secondary | ICD-10-CM | POA: Diagnosis not present

## 2021-04-20 DIAGNOSIS — D638 Anemia in other chronic diseases classified elsewhere: Secondary | ICD-10-CM | POA: Diagnosis not present

## 2021-04-20 DIAGNOSIS — Z1331 Encounter for screening for depression: Secondary | ICD-10-CM | POA: Diagnosis not present

## 2021-04-20 DIAGNOSIS — D6869 Other thrombophilia: Secondary | ICD-10-CM | POA: Diagnosis not present

## 2021-04-20 DIAGNOSIS — G500 Trigeminal neuralgia: Secondary | ICD-10-CM | POA: Diagnosis not present

## 2021-04-20 DIAGNOSIS — I82409 Acute embolism and thrombosis of unspecified deep veins of unspecified lower extremity: Secondary | ICD-10-CM | POA: Diagnosis not present

## 2021-04-20 DIAGNOSIS — F0390 Unspecified dementia without behavioral disturbance: Secondary | ICD-10-CM | POA: Diagnosis not present

## 2021-04-20 DIAGNOSIS — E559 Vitamin D deficiency, unspecified: Secondary | ICD-10-CM | POA: Diagnosis not present

## 2021-04-24 DIAGNOSIS — D638 Anemia in other chronic diseases classified elsewhere: Secondary | ICD-10-CM | POA: Diagnosis not present

## 2021-04-24 DIAGNOSIS — F0390 Unspecified dementia without behavioral disturbance: Secondary | ICD-10-CM | POA: Diagnosis not present

## 2021-04-24 DIAGNOSIS — E559 Vitamin D deficiency, unspecified: Secondary | ICD-10-CM | POA: Diagnosis not present

## 2021-04-24 DIAGNOSIS — G311 Senile degeneration of brain, not elsewhere classified: Secondary | ICD-10-CM | POA: Diagnosis not present

## 2021-04-24 DIAGNOSIS — E039 Hypothyroidism, unspecified: Secondary | ICD-10-CM | POA: Diagnosis not present

## 2021-04-24 DIAGNOSIS — D6869 Other thrombophilia: Secondary | ICD-10-CM | POA: Diagnosis not present

## 2021-04-25 DIAGNOSIS — I480 Paroxysmal atrial fibrillation: Secondary | ICD-10-CM | POA: Diagnosis not present

## 2021-04-25 DIAGNOSIS — I129 Hypertensive chronic kidney disease with stage 1 through stage 4 chronic kidney disease, or unspecified chronic kidney disease: Secondary | ICD-10-CM | POA: Diagnosis not present

## 2021-04-25 DIAGNOSIS — F0390 Unspecified dementia without behavioral disturbance: Secondary | ICD-10-CM | POA: Diagnosis not present

## 2021-04-25 DIAGNOSIS — F32A Depression, unspecified: Secondary | ICD-10-CM | POA: Diagnosis not present

## 2021-04-25 DIAGNOSIS — D638 Anemia in other chronic diseases classified elsewhere: Secondary | ICD-10-CM | POA: Diagnosis not present

## 2021-04-25 DIAGNOSIS — H409 Unspecified glaucoma: Secondary | ICD-10-CM | POA: Diagnosis not present

## 2021-04-25 DIAGNOSIS — M1990 Unspecified osteoarthritis, unspecified site: Secondary | ICD-10-CM | POA: Diagnosis not present

## 2021-04-25 DIAGNOSIS — I10 Essential (primary) hypertension: Secondary | ICD-10-CM | POA: Diagnosis not present

## 2021-04-25 DIAGNOSIS — K219 Gastro-esophageal reflux disease without esophagitis: Secondary | ICD-10-CM | POA: Diagnosis not present

## 2021-04-25 DIAGNOSIS — E039 Hypothyroidism, unspecified: Secondary | ICD-10-CM | POA: Diagnosis not present

## 2021-04-25 DIAGNOSIS — M810 Age-related osteoporosis without current pathological fracture: Secondary | ICD-10-CM | POA: Diagnosis not present

## 2021-04-25 DIAGNOSIS — E559 Vitamin D deficiency, unspecified: Secondary | ICD-10-CM | POA: Diagnosis not present

## 2021-04-25 DIAGNOSIS — G311 Senile degeneration of brain, not elsewhere classified: Secondary | ICD-10-CM | POA: Diagnosis not present

## 2021-04-26 DIAGNOSIS — E039 Hypothyroidism, unspecified: Secondary | ICD-10-CM | POA: Diagnosis not present

## 2021-04-26 DIAGNOSIS — G311 Senile degeneration of brain, not elsewhere classified: Secondary | ICD-10-CM | POA: Diagnosis not present

## 2021-04-26 DIAGNOSIS — D638 Anemia in other chronic diseases classified elsewhere: Secondary | ICD-10-CM | POA: Diagnosis not present

## 2021-04-26 DIAGNOSIS — F0390 Unspecified dementia without behavioral disturbance: Secondary | ICD-10-CM | POA: Diagnosis not present

## 2021-04-26 DIAGNOSIS — F32A Depression, unspecified: Secondary | ICD-10-CM | POA: Diagnosis not present

## 2021-04-26 DIAGNOSIS — E559 Vitamin D deficiency, unspecified: Secondary | ICD-10-CM | POA: Diagnosis not present

## 2021-04-27 DIAGNOSIS — E039 Hypothyroidism, unspecified: Secondary | ICD-10-CM | POA: Diagnosis not present

## 2021-04-27 DIAGNOSIS — G311 Senile degeneration of brain, not elsewhere classified: Secondary | ICD-10-CM | POA: Diagnosis not present

## 2021-04-27 DIAGNOSIS — E559 Vitamin D deficiency, unspecified: Secondary | ICD-10-CM | POA: Diagnosis not present

## 2021-04-27 DIAGNOSIS — F32A Depression, unspecified: Secondary | ICD-10-CM | POA: Diagnosis not present

## 2021-04-27 DIAGNOSIS — D638 Anemia in other chronic diseases classified elsewhere: Secondary | ICD-10-CM | POA: Diagnosis not present

## 2021-04-27 DIAGNOSIS — F0390 Unspecified dementia without behavioral disturbance: Secondary | ICD-10-CM | POA: Diagnosis not present

## 2021-04-30 DIAGNOSIS — E559 Vitamin D deficiency, unspecified: Secondary | ICD-10-CM | POA: Diagnosis not present

## 2021-04-30 DIAGNOSIS — D638 Anemia in other chronic diseases classified elsewhere: Secondary | ICD-10-CM | POA: Diagnosis not present

## 2021-04-30 DIAGNOSIS — F32A Depression, unspecified: Secondary | ICD-10-CM | POA: Diagnosis not present

## 2021-04-30 DIAGNOSIS — F0390 Unspecified dementia without behavioral disturbance: Secondary | ICD-10-CM | POA: Diagnosis not present

## 2021-04-30 DIAGNOSIS — G311 Senile degeneration of brain, not elsewhere classified: Secondary | ICD-10-CM | POA: Diagnosis not present

## 2021-04-30 DIAGNOSIS — E039 Hypothyroidism, unspecified: Secondary | ICD-10-CM | POA: Diagnosis not present

## 2021-05-02 DIAGNOSIS — D638 Anemia in other chronic diseases classified elsewhere: Secondary | ICD-10-CM | POA: Diagnosis not present

## 2021-05-02 DIAGNOSIS — E039 Hypothyroidism, unspecified: Secondary | ICD-10-CM | POA: Diagnosis not present

## 2021-05-02 DIAGNOSIS — G311 Senile degeneration of brain, not elsewhere classified: Secondary | ICD-10-CM | POA: Diagnosis not present

## 2021-05-02 DIAGNOSIS — F32A Depression, unspecified: Secondary | ICD-10-CM | POA: Diagnosis not present

## 2021-05-02 DIAGNOSIS — E559 Vitamin D deficiency, unspecified: Secondary | ICD-10-CM | POA: Diagnosis not present

## 2021-05-02 DIAGNOSIS — F0390 Unspecified dementia without behavioral disturbance: Secondary | ICD-10-CM | POA: Diagnosis not present

## 2021-05-07 DIAGNOSIS — E559 Vitamin D deficiency, unspecified: Secondary | ICD-10-CM | POA: Diagnosis not present

## 2021-05-07 DIAGNOSIS — F32A Depression, unspecified: Secondary | ICD-10-CM | POA: Diagnosis not present

## 2021-05-07 DIAGNOSIS — D638 Anemia in other chronic diseases classified elsewhere: Secondary | ICD-10-CM | POA: Diagnosis not present

## 2021-05-07 DIAGNOSIS — F0390 Unspecified dementia without behavioral disturbance: Secondary | ICD-10-CM | POA: Diagnosis not present

## 2021-05-07 DIAGNOSIS — G311 Senile degeneration of brain, not elsewhere classified: Secondary | ICD-10-CM | POA: Diagnosis not present

## 2021-05-07 DIAGNOSIS — E039 Hypothyroidism, unspecified: Secondary | ICD-10-CM | POA: Diagnosis not present

## 2021-05-08 DIAGNOSIS — M79674 Pain in right toe(s): Secondary | ICD-10-CM | POA: Diagnosis not present

## 2021-05-08 DIAGNOSIS — D638 Anemia in other chronic diseases classified elsewhere: Secondary | ICD-10-CM | POA: Diagnosis not present

## 2021-05-08 DIAGNOSIS — F32A Depression, unspecified: Secondary | ICD-10-CM | POA: Diagnosis not present

## 2021-05-08 DIAGNOSIS — E559 Vitamin D deficiency, unspecified: Secondary | ICD-10-CM | POA: Diagnosis not present

## 2021-05-08 DIAGNOSIS — B351 Tinea unguium: Secondary | ICD-10-CM | POA: Diagnosis not present

## 2021-05-08 DIAGNOSIS — E039 Hypothyroidism, unspecified: Secondary | ICD-10-CM | POA: Diagnosis not present

## 2021-05-08 DIAGNOSIS — F0390 Unspecified dementia without behavioral disturbance: Secondary | ICD-10-CM | POA: Diagnosis not present

## 2021-05-08 DIAGNOSIS — G311 Senile degeneration of brain, not elsewhere classified: Secondary | ICD-10-CM | POA: Diagnosis not present

## 2021-05-08 DIAGNOSIS — M79675 Pain in left toe(s): Secondary | ICD-10-CM | POA: Diagnosis not present

## 2021-05-09 DIAGNOSIS — E559 Vitamin D deficiency, unspecified: Secondary | ICD-10-CM | POA: Diagnosis not present

## 2021-05-09 DIAGNOSIS — E039 Hypothyroidism, unspecified: Secondary | ICD-10-CM | POA: Diagnosis not present

## 2021-05-09 DIAGNOSIS — D638 Anemia in other chronic diseases classified elsewhere: Secondary | ICD-10-CM | POA: Diagnosis not present

## 2021-05-09 DIAGNOSIS — G311 Senile degeneration of brain, not elsewhere classified: Secondary | ICD-10-CM | POA: Diagnosis not present

## 2021-05-09 DIAGNOSIS — F32A Depression, unspecified: Secondary | ICD-10-CM | POA: Diagnosis not present

## 2021-05-09 DIAGNOSIS — F0390 Unspecified dementia without behavioral disturbance: Secondary | ICD-10-CM | POA: Diagnosis not present

## 2021-05-10 DIAGNOSIS — G311 Senile degeneration of brain, not elsewhere classified: Secondary | ICD-10-CM | POA: Diagnosis not present

## 2021-05-10 DIAGNOSIS — E559 Vitamin D deficiency, unspecified: Secondary | ICD-10-CM | POA: Diagnosis not present

## 2021-05-10 DIAGNOSIS — F32A Depression, unspecified: Secondary | ICD-10-CM | POA: Diagnosis not present

## 2021-05-10 DIAGNOSIS — F0390 Unspecified dementia without behavioral disturbance: Secondary | ICD-10-CM | POA: Diagnosis not present

## 2021-05-10 DIAGNOSIS — D638 Anemia in other chronic diseases classified elsewhere: Secondary | ICD-10-CM | POA: Diagnosis not present

## 2021-05-10 DIAGNOSIS — E039 Hypothyroidism, unspecified: Secondary | ICD-10-CM | POA: Diagnosis not present

## 2021-05-14 DIAGNOSIS — D638 Anemia in other chronic diseases classified elsewhere: Secondary | ICD-10-CM | POA: Diagnosis not present

## 2021-05-14 DIAGNOSIS — E039 Hypothyroidism, unspecified: Secondary | ICD-10-CM | POA: Diagnosis not present

## 2021-05-14 DIAGNOSIS — F32A Depression, unspecified: Secondary | ICD-10-CM | POA: Diagnosis not present

## 2021-05-14 DIAGNOSIS — E559 Vitamin D deficiency, unspecified: Secondary | ICD-10-CM | POA: Diagnosis not present

## 2021-05-14 DIAGNOSIS — G311 Senile degeneration of brain, not elsewhere classified: Secondary | ICD-10-CM | POA: Diagnosis not present

## 2021-05-14 DIAGNOSIS — F0390 Unspecified dementia without behavioral disturbance: Secondary | ICD-10-CM | POA: Diagnosis not present

## 2021-05-16 DIAGNOSIS — E559 Vitamin D deficiency, unspecified: Secondary | ICD-10-CM | POA: Diagnosis not present

## 2021-05-16 DIAGNOSIS — F0390 Unspecified dementia without behavioral disturbance: Secondary | ICD-10-CM | POA: Diagnosis not present

## 2021-05-16 DIAGNOSIS — E039 Hypothyroidism, unspecified: Secondary | ICD-10-CM | POA: Diagnosis not present

## 2021-05-16 DIAGNOSIS — G311 Senile degeneration of brain, not elsewhere classified: Secondary | ICD-10-CM | POA: Diagnosis not present

## 2021-05-16 DIAGNOSIS — F32A Depression, unspecified: Secondary | ICD-10-CM | POA: Diagnosis not present

## 2021-05-16 DIAGNOSIS — D638 Anemia in other chronic diseases classified elsewhere: Secondary | ICD-10-CM | POA: Diagnosis not present

## 2021-05-17 DIAGNOSIS — G311 Senile degeneration of brain, not elsewhere classified: Secondary | ICD-10-CM | POA: Diagnosis not present

## 2021-05-17 DIAGNOSIS — E039 Hypothyroidism, unspecified: Secondary | ICD-10-CM | POA: Diagnosis not present

## 2021-05-17 DIAGNOSIS — F32A Depression, unspecified: Secondary | ICD-10-CM | POA: Diagnosis not present

## 2021-05-17 DIAGNOSIS — D638 Anemia in other chronic diseases classified elsewhere: Secondary | ICD-10-CM | POA: Diagnosis not present

## 2021-05-17 DIAGNOSIS — F0390 Unspecified dementia without behavioral disturbance: Secondary | ICD-10-CM | POA: Diagnosis not present

## 2021-05-17 DIAGNOSIS — E559 Vitamin D deficiency, unspecified: Secondary | ICD-10-CM | POA: Diagnosis not present

## 2021-05-18 DIAGNOSIS — E039 Hypothyroidism, unspecified: Secondary | ICD-10-CM | POA: Diagnosis not present

## 2021-05-18 DIAGNOSIS — F0390 Unspecified dementia without behavioral disturbance: Secondary | ICD-10-CM | POA: Diagnosis not present

## 2021-05-18 DIAGNOSIS — E559 Vitamin D deficiency, unspecified: Secondary | ICD-10-CM | POA: Diagnosis not present

## 2021-05-18 DIAGNOSIS — D638 Anemia in other chronic diseases classified elsewhere: Secondary | ICD-10-CM | POA: Diagnosis not present

## 2021-05-18 DIAGNOSIS — F32A Depression, unspecified: Secondary | ICD-10-CM | POA: Diagnosis not present

## 2021-05-18 DIAGNOSIS — G311 Senile degeneration of brain, not elsewhere classified: Secondary | ICD-10-CM | POA: Diagnosis not present

## 2021-05-19 DIAGNOSIS — E559 Vitamin D deficiency, unspecified: Secondary | ICD-10-CM | POA: Diagnosis not present

## 2021-05-19 DIAGNOSIS — E039 Hypothyroidism, unspecified: Secondary | ICD-10-CM | POA: Diagnosis not present

## 2021-05-19 DIAGNOSIS — F32A Depression, unspecified: Secondary | ICD-10-CM | POA: Diagnosis not present

## 2021-05-19 DIAGNOSIS — F0390 Unspecified dementia without behavioral disturbance: Secondary | ICD-10-CM | POA: Diagnosis not present

## 2021-05-19 DIAGNOSIS — G311 Senile degeneration of brain, not elsewhere classified: Secondary | ICD-10-CM | POA: Diagnosis not present

## 2021-05-19 DIAGNOSIS — D638 Anemia in other chronic diseases classified elsewhere: Secondary | ICD-10-CM | POA: Diagnosis not present

## 2021-05-20 DIAGNOSIS — E039 Hypothyroidism, unspecified: Secondary | ICD-10-CM | POA: Diagnosis not present

## 2021-05-20 DIAGNOSIS — F32A Depression, unspecified: Secondary | ICD-10-CM | POA: Diagnosis not present

## 2021-05-20 DIAGNOSIS — E559 Vitamin D deficiency, unspecified: Secondary | ICD-10-CM | POA: Diagnosis not present

## 2021-05-20 DIAGNOSIS — F0390 Unspecified dementia without behavioral disturbance: Secondary | ICD-10-CM | POA: Diagnosis not present

## 2021-05-20 DIAGNOSIS — D638 Anemia in other chronic diseases classified elsewhere: Secondary | ICD-10-CM | POA: Diagnosis not present

## 2021-05-20 DIAGNOSIS — G311 Senile degeneration of brain, not elsewhere classified: Secondary | ICD-10-CM | POA: Diagnosis not present

## 2021-05-21 DIAGNOSIS — G311 Senile degeneration of brain, not elsewhere classified: Secondary | ICD-10-CM | POA: Diagnosis not present

## 2021-05-21 DIAGNOSIS — D638 Anemia in other chronic diseases classified elsewhere: Secondary | ICD-10-CM | POA: Diagnosis not present

## 2021-05-21 DIAGNOSIS — F0390 Unspecified dementia without behavioral disturbance: Secondary | ICD-10-CM | POA: Diagnosis not present

## 2021-05-21 DIAGNOSIS — E559 Vitamin D deficiency, unspecified: Secondary | ICD-10-CM | POA: Diagnosis not present

## 2021-05-21 DIAGNOSIS — F32A Depression, unspecified: Secondary | ICD-10-CM | POA: Diagnosis not present

## 2021-05-21 DIAGNOSIS — E039 Hypothyroidism, unspecified: Secondary | ICD-10-CM | POA: Diagnosis not present

## 2021-05-23 DEATH — deceased
